# Patient Record
Sex: Male | Born: 1935 | Race: Black or African American | Hispanic: No | Marital: Married | Smoking: Never smoker
Health system: Southern US, Community
[De-identification: ages and names within clinical notes are randomized; demographics above are authoritative.]

## PROBLEM LIST (undated history)

## (undated) DIAGNOSIS — D649 Anemia, unspecified: Secondary | ICD-10-CM

## (undated) DIAGNOSIS — R001 Bradycardia, unspecified: Secondary | ICD-10-CM

## (undated) DIAGNOSIS — M109 Gout, unspecified: Secondary | ICD-10-CM

## (undated) DIAGNOSIS — Z992 Dependence on renal dialysis: Secondary | ICD-10-CM

## (undated) DIAGNOSIS — I4892 Unspecified atrial flutter: Secondary | ICD-10-CM

## (undated) DIAGNOSIS — E119 Type 2 diabetes mellitus without complications: Secondary | ICD-10-CM

## (undated) DIAGNOSIS — I471 Supraventricular tachycardia, unspecified: Secondary | ICD-10-CM

## (undated) DIAGNOSIS — N186 End stage renal disease: Secondary | ICD-10-CM

## (undated) DIAGNOSIS — I480 Paroxysmal atrial fibrillation: Secondary | ICD-10-CM

## (undated) DIAGNOSIS — G629 Polyneuropathy, unspecified: Secondary | ICD-10-CM

## (undated) DIAGNOSIS — H409 Unspecified glaucoma: Secondary | ICD-10-CM

## (undated) DIAGNOSIS — I10 Essential (primary) hypertension: Secondary | ICD-10-CM

## (undated) HISTORY — PX: SP AV DIALYSIS SHUNT ACCESS EXISTING *L*: HXRAD383

## (undated) HISTORY — PX: ABLATION OF DYSRHYTHMIC FOCUS: SHX254

---

## 1997-07-03 ENCOUNTER — Encounter: Admission: RE | Admit: 1997-07-03 | Discharge: 1997-07-03 | Payer: Self-pay | Admitting: Internal Medicine

## 1997-07-17 ENCOUNTER — Encounter: Admission: RE | Admit: 1997-07-17 | Discharge: 1997-07-17 | Payer: Self-pay | Admitting: Hematology and Oncology

## 1997-09-15 ENCOUNTER — Encounter: Admission: RE | Admit: 1997-09-15 | Discharge: 1997-09-15 | Payer: Self-pay | Admitting: Internal Medicine

## 1997-11-16 ENCOUNTER — Ambulatory Visit (HOSPITAL_COMMUNITY): Admission: RE | Admit: 1997-11-16 | Discharge: 1997-11-16 | Payer: Self-pay

## 1997-11-16 ENCOUNTER — Encounter: Admission: RE | Admit: 1997-11-16 | Discharge: 1997-11-16 | Payer: Self-pay | Admitting: Hematology and Oncology

## 1998-01-15 ENCOUNTER — Encounter: Admission: RE | Admit: 1998-01-15 | Discharge: 1998-01-15 | Payer: Self-pay | Admitting: Hematology and Oncology

## 1998-03-10 ENCOUNTER — Encounter: Admission: RE | Admit: 1998-03-10 | Discharge: 1998-03-10 | Payer: Self-pay | Admitting: Internal Medicine

## 1998-05-12 ENCOUNTER — Encounter: Admission: RE | Admit: 1998-05-12 | Discharge: 1998-05-12 | Payer: Self-pay | Admitting: Hematology and Oncology

## 1998-05-27 ENCOUNTER — Ambulatory Visit (HOSPITAL_COMMUNITY): Admission: RE | Admit: 1998-05-27 | Discharge: 1998-05-27 | Payer: Self-pay | Admitting: Urology

## 1998-05-27 ENCOUNTER — Encounter: Payer: Self-pay | Admitting: Urology

## 1998-06-21 ENCOUNTER — Encounter: Admission: RE | Admit: 1998-06-21 | Discharge: 1998-06-21 | Payer: Self-pay | Admitting: Internal Medicine

## 1998-09-27 ENCOUNTER — Encounter: Admission: RE | Admit: 1998-09-27 | Discharge: 1998-09-27 | Payer: Self-pay | Admitting: Internal Medicine

## 1998-12-21 ENCOUNTER — Ambulatory Visit (HOSPITAL_COMMUNITY): Admission: RE | Admit: 1998-12-21 | Discharge: 1998-12-21 | Payer: Self-pay | Admitting: Internal Medicine

## 1998-12-21 ENCOUNTER — Encounter: Admission: RE | Admit: 1998-12-21 | Discharge: 1998-12-21 | Payer: Self-pay | Admitting: Internal Medicine

## 1999-01-10 ENCOUNTER — Encounter: Admission: RE | Admit: 1999-01-10 | Discharge: 1999-01-10 | Payer: Self-pay | Admitting: Internal Medicine

## 1999-01-17 ENCOUNTER — Encounter: Admission: RE | Admit: 1999-01-17 | Discharge: 1999-01-17 | Payer: Self-pay | Admitting: Internal Medicine

## 1999-03-04 ENCOUNTER — Encounter: Admission: RE | Admit: 1999-03-04 | Discharge: 1999-03-04 | Payer: Self-pay | Admitting: Internal Medicine

## 1999-03-07 ENCOUNTER — Emergency Department (HOSPITAL_COMMUNITY): Admission: EM | Admit: 1999-03-07 | Discharge: 1999-03-07 | Payer: Self-pay | Admitting: Emergency Medicine

## 1999-03-10 ENCOUNTER — Encounter: Admission: RE | Admit: 1999-03-10 | Discharge: 1999-03-10 | Payer: Self-pay | Admitting: Internal Medicine

## 1999-04-12 ENCOUNTER — Encounter: Admission: RE | Admit: 1999-04-12 | Discharge: 1999-04-12 | Payer: Self-pay | Admitting: Internal Medicine

## 1999-05-17 ENCOUNTER — Encounter: Admission: RE | Admit: 1999-05-17 | Discharge: 1999-05-17 | Payer: Self-pay | Admitting: Internal Medicine

## 1999-06-07 ENCOUNTER — Encounter: Admission: RE | Admit: 1999-06-07 | Discharge: 1999-06-07 | Payer: Self-pay | Admitting: Internal Medicine

## 1999-10-24 ENCOUNTER — Encounter: Admission: RE | Admit: 1999-10-24 | Discharge: 1999-10-24 | Payer: Self-pay | Admitting: Internal Medicine

## 2000-01-01 ENCOUNTER — Emergency Department (HOSPITAL_COMMUNITY): Admission: EM | Admit: 2000-01-01 | Discharge: 2000-01-01 | Payer: Self-pay | Admitting: Emergency Medicine

## 2000-01-01 ENCOUNTER — Encounter: Payer: Self-pay | Admitting: Emergency Medicine

## 2000-01-09 ENCOUNTER — Ambulatory Visit (HOSPITAL_COMMUNITY): Admission: RE | Admit: 2000-01-09 | Discharge: 2000-01-09 | Payer: Self-pay | Admitting: Internal Medicine

## 2000-01-09 ENCOUNTER — Encounter: Admission: RE | Admit: 2000-01-09 | Discharge: 2000-01-09 | Payer: Self-pay | Admitting: Internal Medicine

## 2000-01-17 ENCOUNTER — Encounter: Admission: RE | Admit: 2000-01-17 | Discharge: 2000-01-17 | Payer: Self-pay | Admitting: Hematology and Oncology

## 2000-04-12 ENCOUNTER — Encounter: Admission: RE | Admit: 2000-04-12 | Discharge: 2000-04-12 | Payer: Self-pay | Admitting: Hematology and Oncology

## 2000-07-16 ENCOUNTER — Encounter: Admission: RE | Admit: 2000-07-16 | Discharge: 2000-07-16 | Payer: Self-pay | Admitting: Internal Medicine

## 2000-09-05 ENCOUNTER — Encounter: Admission: RE | Admit: 2000-09-05 | Discharge: 2000-09-05 | Payer: Self-pay | Admitting: Internal Medicine

## 2000-10-04 ENCOUNTER — Encounter: Admission: RE | Admit: 2000-10-04 | Discharge: 2000-10-04 | Payer: Self-pay | Admitting: Internal Medicine

## 2000-11-21 ENCOUNTER — Encounter: Admission: RE | Admit: 2000-11-21 | Discharge: 2000-11-21 | Payer: Self-pay

## 2001-04-17 ENCOUNTER — Observation Stay (HOSPITAL_COMMUNITY): Admission: AD | Admit: 2001-04-17 | Discharge: 2001-04-18 | Payer: Self-pay | Admitting: Internal Medicine

## 2001-04-30 ENCOUNTER — Encounter: Admission: RE | Admit: 2001-04-30 | Discharge: 2001-04-30 | Payer: Self-pay | Admitting: Internal Medicine

## 2001-05-09 ENCOUNTER — Encounter: Admission: RE | Admit: 2001-05-09 | Discharge: 2001-05-09 | Payer: Self-pay | Admitting: Internal Medicine

## 2001-05-31 ENCOUNTER — Encounter: Admission: RE | Admit: 2001-05-31 | Discharge: 2001-05-31 | Payer: Self-pay | Admitting: Internal Medicine

## 2001-08-09 ENCOUNTER — Encounter: Admission: RE | Admit: 2001-08-09 | Discharge: 2001-08-09 | Payer: Self-pay | Admitting: Internal Medicine

## 2001-08-16 ENCOUNTER — Encounter: Admission: RE | Admit: 2001-08-16 | Discharge: 2001-08-16 | Payer: Self-pay | Admitting: Internal Medicine

## 2001-08-19 ENCOUNTER — Ambulatory Visit (HOSPITAL_COMMUNITY): Admission: RE | Admit: 2001-08-19 | Discharge: 2001-08-19 | Payer: Self-pay

## 2001-11-06 ENCOUNTER — Encounter: Admission: RE | Admit: 2001-11-06 | Discharge: 2001-11-06 | Payer: Self-pay | Admitting: Internal Medicine

## 2001-11-25 ENCOUNTER — Encounter: Admission: RE | Admit: 2001-11-25 | Discharge: 2001-11-25 | Payer: Self-pay | Admitting: Internal Medicine

## 2001-12-12 ENCOUNTER — Encounter: Admission: RE | Admit: 2001-12-12 | Discharge: 2001-12-12 | Payer: Self-pay | Admitting: Internal Medicine

## 2002-01-20 ENCOUNTER — Encounter: Admission: RE | Admit: 2002-01-20 | Discharge: 2002-01-20 | Payer: Self-pay | Admitting: Internal Medicine

## 2002-03-06 ENCOUNTER — Encounter: Admission: RE | Admit: 2002-03-06 | Discharge: 2002-03-06 | Payer: Self-pay | Admitting: Internal Medicine

## 2002-03-28 ENCOUNTER — Ambulatory Visit (HOSPITAL_COMMUNITY): Admission: RE | Admit: 2002-03-28 | Discharge: 2002-03-28 | Payer: Self-pay | Admitting: Interventional Cardiology

## 2002-03-28 ENCOUNTER — Encounter: Payer: Self-pay | Admitting: Interventional Cardiology

## 2002-06-06 ENCOUNTER — Encounter: Admission: RE | Admit: 2002-06-06 | Discharge: 2002-06-06 | Payer: Self-pay | Admitting: Internal Medicine

## 2002-08-26 ENCOUNTER — Encounter: Admission: RE | Admit: 2002-08-26 | Discharge: 2002-08-26 | Payer: Self-pay | Admitting: Internal Medicine

## 2002-09-08 ENCOUNTER — Encounter: Payer: Self-pay | Admitting: Internal Medicine

## 2002-09-08 ENCOUNTER — Ambulatory Visit (HOSPITAL_COMMUNITY): Admission: RE | Admit: 2002-09-08 | Discharge: 2002-09-08 | Payer: Self-pay | Admitting: Internal Medicine

## 2002-10-29 ENCOUNTER — Encounter: Admission: RE | Admit: 2002-10-29 | Discharge: 2002-10-29 | Payer: Self-pay | Admitting: Internal Medicine

## 2002-12-08 ENCOUNTER — Encounter: Admission: RE | Admit: 2002-12-08 | Discharge: 2002-12-08 | Payer: Self-pay | Admitting: Internal Medicine

## 2003-01-08 ENCOUNTER — Encounter: Admission: RE | Admit: 2003-01-08 | Discharge: 2003-01-08 | Payer: Self-pay | Admitting: Internal Medicine

## 2003-05-11 ENCOUNTER — Ambulatory Visit (HOSPITAL_COMMUNITY): Admission: RE | Admit: 2003-05-11 | Discharge: 2003-05-11 | Payer: Self-pay | Admitting: Internal Medicine

## 2003-05-14 ENCOUNTER — Encounter: Admission: RE | Admit: 2003-05-14 | Discharge: 2003-05-14 | Payer: Self-pay | Admitting: Internal Medicine

## 2003-08-24 ENCOUNTER — Encounter: Admission: RE | Admit: 2003-08-24 | Discharge: 2003-08-24 | Payer: Self-pay | Admitting: Internal Medicine

## 2003-10-13 ENCOUNTER — Emergency Department (HOSPITAL_COMMUNITY): Admission: EM | Admit: 2003-10-13 | Discharge: 2003-10-13 | Payer: Self-pay | Admitting: Emergency Medicine

## 2004-01-07 ENCOUNTER — Emergency Department (HOSPITAL_COMMUNITY): Admission: EM | Admit: 2004-01-07 | Discharge: 2004-01-07 | Payer: Self-pay | Admitting: Emergency Medicine

## 2004-01-30 ENCOUNTER — Emergency Department (HOSPITAL_COMMUNITY): Admission: EM | Admit: 2004-01-30 | Discharge: 2004-01-30 | Payer: Self-pay | Admitting: *Deleted

## 2005-01-17 ENCOUNTER — Encounter: Admission: RE | Admit: 2005-01-17 | Discharge: 2005-01-17 | Payer: Self-pay | Admitting: Nephrology

## 2010-01-26 ENCOUNTER — Inpatient Hospital Stay (HOSPITAL_COMMUNITY)
Admission: EM | Admit: 2010-01-26 | Discharge: 2010-02-03 | Payer: Self-pay | Source: Home / Self Care | Attending: Nephrology | Admitting: Nephrology

## 2010-01-27 ENCOUNTER — Encounter (INDEPENDENT_AMBULATORY_CARE_PROVIDER_SITE_OTHER): Payer: Self-pay | Admitting: Nephrology

## 2010-02-02 LAB — GLUCOSE, CAPILLARY
Glucose-Capillary: 161 mg/dL — ABNORMAL HIGH (ref 70–99)
Glucose-Capillary: 116 mg/dL — ABNORMAL HIGH (ref 70–99)
Glucose-Capillary: 223 mg/dL — ABNORMAL HIGH (ref 70–99)
Glucose-Capillary: 209 mg/dL — ABNORMAL HIGH (ref 70–99)

## 2010-02-03 LAB — GLUCOSE, CAPILLARY
Glucose-Capillary: 245 mg/dL — ABNORMAL HIGH (ref 70–99)
Glucose-Capillary: 334 mg/dL — ABNORMAL HIGH (ref 70–99)

## 2010-03-10 ENCOUNTER — Ambulatory Visit: Payer: Self-pay

## 2010-03-26 ENCOUNTER — Inpatient Hospital Stay (HOSPITAL_COMMUNITY)
Admission: EM | Admit: 2010-03-26 | Discharge: 2010-03-29 | DRG: 553 | Disposition: A | Discharge status: Home / Self Care | Payer: Medicare Other | Attending: Internal Medicine | Admitting: Internal Medicine

## 2010-03-26 ENCOUNTER — Inpatient Hospital Stay (HOSPITAL_COMMUNITY): Payer: Medicare Other

## 2010-03-26 ENCOUNTER — Emergency Department (HOSPITAL_COMMUNITY): Payer: Medicare Other

## 2010-03-26 DIAGNOSIS — H409 Unspecified glaucoma: Secondary | ICD-10-CM | POA: Diagnosis present

## 2010-03-26 DIAGNOSIS — N2581 Secondary hyperparathyroidism of renal origin: Secondary | ICD-10-CM | POA: Diagnosis present

## 2010-03-26 DIAGNOSIS — E119 Type 2 diabetes mellitus without complications: Secondary | ICD-10-CM | POA: Diagnosis present

## 2010-03-26 DIAGNOSIS — Z992 Dependence on renal dialysis: Secondary | ICD-10-CM

## 2010-03-26 DIAGNOSIS — I120 Hypertensive chronic kidney disease with stage 5 chronic kidney disease or end stage renal disease: Secondary | ICD-10-CM | POA: Diagnosis present

## 2010-03-26 DIAGNOSIS — Z79899 Other long term (current) drug therapy: Secondary | ICD-10-CM

## 2010-03-26 DIAGNOSIS — D638 Anemia in other chronic diseases classified elsewhere: Secondary | ICD-10-CM | POA: Diagnosis present

## 2010-03-26 DIAGNOSIS — M109 Gout, unspecified: Principal | ICD-10-CM | POA: Diagnosis present

## 2010-03-26 DIAGNOSIS — N186 End stage renal disease: Secondary | ICD-10-CM | POA: Diagnosis present

## 2010-03-26 DIAGNOSIS — Z7982 Long term (current) use of aspirin: Secondary | ICD-10-CM

## 2010-03-26 LAB — POCT I-STAT, CHEM 8
BUN: 39 mg/dL — ABNORMAL HIGH (ref 6–23)
Calcium, Ion: 1.06 mmol/L — ABNORMAL LOW (ref 1.12–1.32)
Chloride: 98 mEq/L (ref 96–112)
Creatinine, Ser: 6.3 mg/dL — ABNORMAL HIGH (ref 0.4–1.5)
Glucose, Bld: 92 mg/dL (ref 70–99)
HCT: 40 % (ref 39.0–52.0)
Hemoglobin: 13.6 g/dL (ref 13.0–17.0)
Potassium: 4 mEq/L (ref 3.5–5.1)
Sodium: 133 mEq/L — ABNORMAL LOW (ref 135–145)
TCO2: 25 mmol/L (ref 0–100)

## 2010-03-26 LAB — DIFFERENTIAL
Basophils Absolute: 0 10*3/uL (ref 0.0–0.1)
Basophils Relative: 0 % (ref 0–1)
Eosinophils Absolute: 0 10*3/uL (ref 0.0–0.7)
Eosinophils Relative: 0 % (ref 0–5)
Lymphocytes Relative: 12 % (ref 12–46)
Lymphs Abs: 1 10*3/uL (ref 0.7–4.0)
Monocytes Absolute: 1 10*3/uL (ref 0.1–1.0)
Monocytes Relative: 11 % (ref 3–12)
Neutro Abs: 6.6 10*3/uL (ref 1.7–7.7)
Neutrophils Relative %: 77 % (ref 43–77)

## 2010-03-26 LAB — COMPREHENSIVE METABOLIC PANEL
ALT: 11 U/L (ref 0–53)
AST: 18 U/L (ref 0–37)
Alkaline Phosphatase: 75 U/L (ref 39–117)
CO2: 26 mEq/L (ref 19–32)
GFR calc Af Amer: 12 mL/min — ABNORMAL LOW (ref >60)
Glucose, Bld: 88 mg/dL (ref 70–99)
Potassium: 4.1 mEq/L (ref 3.5–5.1)
Sodium: 131 mEq/L — ABNORMAL LOW (ref 135–145)
Total Protein: 8.3 g/dL (ref 6.0–8.3)

## 2010-03-26 LAB — CBC
HCT: 36.2 % — ABNORMAL LOW (ref 39.0–52.0)
Hemoglobin: 11.8 g/dL — ABNORMAL LOW (ref 13.0–17.0)
RDW: 15.3 % (ref 11.5–15.5)
WBC: 8.6 10*3/uL (ref 4.0–10.5)

## 2010-03-26 LAB — URIC ACID: Uric Acid, Serum: 6.9 mg/dL (ref 4.0–7.8)

## 2010-03-27 LAB — CBC
MCV: 81.3 fL (ref 78.0–100.0)
Platelets: 304 10*3/uL (ref 150–400)
RBC: 4.28 MIL/uL (ref 4.22–5.81)
RDW: 15.5 % (ref 11.5–15.5)
WBC: 7 10*3/uL (ref 4.0–10.5)

## 2010-03-27 LAB — TSH: TSH: 2.752 u[IU]/mL (ref 0.350–4.500)

## 2010-03-27 LAB — BASIC METABOLIC PANEL
BUN: 25 mg/dL — ABNORMAL HIGH (ref 6–23)
Chloride: 96 mEq/L (ref 96–112)
GFR calc Af Amer: 15 mL/min — ABNORMAL LOW (ref >60)
GFR calc non Af Amer: 13 mL/min — ABNORMAL LOW (ref >60)
Potassium: 3.6 mEq/L (ref 3.5–5.1)
Sodium: 132 mEq/L — ABNORMAL LOW (ref 135–145)

## 2010-03-27 LAB — HEMOGLOBIN A1C
Hgb A1c MFr Bld: 6.1 % — ABNORMAL HIGH (ref <5.7)
Mean Plasma Glucose: 128 mg/dL — ABNORMAL HIGH (ref <117)

## 2010-03-27 LAB — HEPATITIS B SURFACE ANTIGEN: Hepatitis B Surface Ag: NEGATIVE

## 2010-03-27 LAB — GLUCOSE, CAPILLARY
Glucose-Capillary: 68 mg/dL — ABNORMAL LOW (ref 70–99)
Glucose-Capillary: 262 mg/dL — ABNORMAL HIGH (ref 70–99)
Glucose-Capillary: 252 mg/dL — ABNORMAL HIGH (ref 70–99)

## 2010-03-28 LAB — GLUCOSE, CAPILLARY
Glucose-Capillary: 149 mg/dL — ABNORMAL HIGH (ref 70–99)
Glucose-Capillary: 279 mg/dL — ABNORMAL HIGH (ref 70–99)

## 2010-03-29 ENCOUNTER — Inpatient Hospital Stay (HOSPITAL_COMMUNITY): Payer: Medicare Other

## 2010-03-29 ENCOUNTER — Inpatient Hospital Stay (HOSPITAL_COMMUNITY)
Admission: RE | Admit: 2010-03-29 | Discharge: 2010-03-29 | Disposition: A | Discharge status: Home / Self Care | Payer: Medicare Other | Source: Ambulatory Visit | Attending: Internal Medicine | Admitting: Internal Medicine

## 2010-03-29 LAB — POCT I-STAT 4, (NA,K, GLUC, HGB,HCT)
Glucose, Bld: 169 mg/dL — ABNORMAL HIGH (ref 70–99)
Hemoglobin: 11.6 g/dL — ABNORMAL LOW (ref 13.0–17.0)
Potassium: 3.9 mEq/L (ref 3.5–5.1)

## 2010-03-29 LAB — GLUCOSE, CAPILLARY: Glucose-Capillary: 107 mg/dL — ABNORMAL HIGH (ref 70–99)

## 2010-04-02 LAB — CULTURE, BLOOD (ROUTINE X 2)
Culture  Setup Time: 201202260135
Culture: NO GROWTH

## 2010-04-07 NOTE — Discharge Summary (Addendum)
  [name] [name], [name] [name]               ACCOUNT NO.:  [id number]  MEDICAL RECORD NO.:  [id number]           PATIENT TYPE:  O  LOCATION:  DIALYSI                      FACILITY:  [facility]  PHYSICIAN:  Deirdre Peer. Leontae Bostock, M.D. DATE OF BIRTH:  [birth year]  DATE OF ADMISSION:  03/29/2010 DATE OF DISCHARGE:                              DISCHARGE SUMMARY   DISCHARGE DIAGNOSIS: 1. Gout flare right wrist and bilateral knee with resultant ambulatory     dysfunction, resolved at discharge, prednisone course completed in     hospital.  The patient will continue with colchicine and     indomethacin x7 days. 2. Hypertension. 3. End-stage renal disease. 4. Diabetes. 5. Glaucoma. 6. Ambulatory dysfunction, improved.  Please note the patient refused     home physical therapy.  DISCHARGE MEDICATIONS: 1. Calcium. 2. Indomethacin. 3. Prednisone.  Please note prednisone course completed during     hospital. 4. Amlodipine 10 mg. 5. Aspirin 81 mg. 6. Atenolol 100 mg. 7. Benazepril 40 mg.8. Clonidine 0.2 mg half tablet twice a day. 9. Colchicine 0.6 mg daily for 7 days. 10.Glipizide XL 10 mg. 11.Prednisolone acetate drops 12.Nephro-Vite tab daily. 13.Simvastatin 20 mg daily. 14.Renagel 3 times a day. 15.Travatan. 16.Tramadol b.i.d. p.r.n.  DISPOSITION:  Discharged to home in stable condition.  Again, please note the patient refused home PT.  CONSULTANTS:  [facility]  STUDIES:  Blood culture x2, no growth to date.  Hemoglobin A1c 6.1, creatinine 4.5.  TSH 2.7.  Uric acid 6.9.  Chest x-ray, no acute findings.  HISTORY OF PRESENT ILLNESS:  Pleasant 77 year old male presented to the hospital with weakness and inability to walk and right hand painful and swollen from an apparent gout flare.  Due to increased risk of fall admission was deemed necessary for further evaluation and treatment. Please see dictated H and P for further details.  Past medical history, medications, social history,  past surgical history, allergies, family history per admission H and P.  HOSPITAL COURSE:  The patient was admitted to a medicine floor bed for evaluation and treatment of extremity pain related to a gout flare and results in ambulatory dysfunction.  The patient was treated with prednisone, colchicine and indomethacin.  The patient had significant improvement in his symptoms.  He was seen by PT.  He was ambulating well.  Home PT was recommended, however, the patient refused.  The patient was continued on his outpatient meds.  There were no complications during this hospitalization.  He did have a transient elevation in his blood sugar and BP when he was without his meds, however, both came under a rapid control with restarting home medication.  The patient did receive dialysis while hospitalized. Again, there were no complications.  The patient is medically stable for discharge to home.     Deirdre Peer. Nneka Blanda, M.D.     RDP/MEDQ  D:  03/29/2010  T:  03/29/2010  Job:  [id number]  Electronically Signed by [name] [name] M.D. on 04/05/2010 05:22:51 PM Electronically Signed by [name] [name] M.D. on 04/05/2010 05:22:51 PM Electronically Signed by [name] [name] M.D. on 04/05/2010 07:36:58 PM

## 2010-04-11 LAB — CULTURE, BLOOD (ROUTINE X 2)
Culture  Setup Time: 201201011040
Culture  Setup Time: 201201011040
Culture: NO GROWTH
Culture: NO GROWTH

## 2010-04-11 LAB — BASIC METABOLIC PANEL
BUN: 59 mg/dL — ABNORMAL HIGH (ref 6–23)
GFR calc non Af Amer: 9 mL/min — ABNORMAL LOW (ref >60)
Glucose, Bld: 88 mg/dL (ref 70–99)
Potassium: 4.7 mEq/L (ref 3.5–5.1)

## 2010-04-11 LAB — CBC
HCT: 26.1 % — ABNORMAL LOW (ref 39.0–52.0)
HCT: 26.7 % — ABNORMAL LOW (ref 39.0–52.0)
HCT: 29.4 % — ABNORMAL LOW (ref 39.0–52.0)
Hemoglobin: 8.7 g/dL — ABNORMAL LOW (ref 13.0–17.0)
Hemoglobin: 8.8 g/dL — ABNORMAL LOW (ref 13.0–17.0)
MCH: 27.2 pg (ref 26.0–34.0)
MCH: 27.3 pg (ref 26.0–34.0)
MCH: 27.7 pg (ref 26.0–34.0)
MCHC: 32.6 g/dL (ref 30.0–36.0)
MCHC: 33.3 g/dL (ref 30.0–36.0)
MCHC: 34.2 g/dL (ref 30.0–36.0)
MCV: 81.8 fL (ref 78.0–100.0)
MCV: 82.1 fL (ref 78.0–100.0)
MCV: 82.6 fL (ref 78.0–100.0)
MCV: 83.2 fL (ref 78.0–100.0)
Platelets: 192 10*3/uL (ref 150–400)
Platelets: 195 10*3/uL (ref 150–400)
Platelets: 214 10*3/uL (ref 150–400)
Platelets: 215 10*3/uL (ref 150–400)
RBC: 3.19 MIL/uL — ABNORMAL LOW (ref 4.22–5.81)
RBC: 3.24 MIL/uL — ABNORMAL LOW (ref 4.22–5.81)
RBC: 3.3 MIL/uL — ABNORMAL LOW (ref 4.22–5.81)
RBC: 3.56 MIL/uL — ABNORMAL LOW (ref 4.22–5.81)
RBC: 3.64 MIL/uL — ABNORMAL LOW (ref 4.22–5.81)
RDW: 13.8 % (ref 11.5–15.5)
RDW: 14 % (ref 11.5–15.5)
RDW: 14.1 % (ref 11.5–15.5)
WBC: 10.4 10*3/uL (ref 4.0–10.5)
WBC: 6.9 10*3/uL (ref 4.0–10.5)
WBC: 8.7 10*3/uL (ref 4.0–10.5)

## 2010-04-11 LAB — RENAL FUNCTION PANEL
Albumin: 2 g/dL — ABNORMAL LOW (ref 3.5–5.2)
Albumin: 2.4 g/dL — ABNORMAL LOW (ref 3.5–5.2)
Albumin: 2.5 g/dL — ABNORMAL LOW (ref 3.5–5.2)
BUN: 44 mg/dL — ABNORMAL HIGH (ref 6–23)
BUN: 50 mg/dL — ABNORMAL HIGH (ref 6–23)
BUN: 58 mg/dL — ABNORMAL HIGH (ref 6–23)
CO2: 26 mEq/L (ref 19–32)
CO2: 27 mEq/L (ref 19–32)
Calcium: 8.7 mg/dL (ref 8.4–10.5)
Chloride: 102 mEq/L (ref 96–112)
Chloride: 102 mEq/L (ref 96–112)
Creatinine, Ser: 4.21 mg/dL — ABNORMAL HIGH (ref 0.4–1.5)
GFR calc Af Amer: 10 mL/min — ABNORMAL LOW (ref >60)
GFR calc Af Amer: 12 mL/min — ABNORMAL LOW (ref >60)
GFR calc Af Amer: 14 mL/min — ABNORMAL LOW (ref >60)
GFR calc non Af Amer: 10 mL/min — ABNORMAL LOW (ref >60)
GFR calc non Af Amer: 12 mL/min — ABNORMAL LOW (ref >60)
GFR calc non Af Amer: 9 mL/min — ABNORMAL LOW (ref >60)
Glucose, Bld: 107 mg/dL — ABNORMAL HIGH (ref 70–99)
Glucose, Bld: 188 mg/dL — ABNORMAL HIGH (ref 70–99)
Phosphorus: 5 mg/dL — ABNORMAL HIGH (ref 2.3–4.6)
Phosphorus: 7.9 mg/dL — ABNORMAL HIGH (ref 2.3–4.6)
Potassium: 3.9 mEq/L (ref 3.5–5.1)
Potassium: 4.3 mEq/L (ref 3.5–5.1)
Potassium: 4.5 mEq/L (ref 3.5–5.1)
Potassium: 4.7 mEq/L (ref 3.5–5.1)
Sodium: 138 mEq/L (ref 135–145)
Sodium: 138 mEq/L (ref 135–145)

## 2010-04-11 LAB — CARDIAC PANEL(CRET KIN+CKTOT+MB+TROPI)
CK, MB: 2.7 ng/mL (ref 0.3–4.0)
Relative Index: 0.8 (ref 0.0–2.5)
Relative Index: 0.9 (ref 0.0–2.5)
Total CK: 328 U/L — ABNORMAL HIGH (ref 7–232)
Troponin I: 0.06 ng/mL (ref 0.00–0.06)

## 2010-04-11 LAB — FERRITIN: Ferritin: 262 ng/mL (ref 22–322)

## 2010-04-11 LAB — GLUCOSE, CAPILLARY
Glucose-Capillary: 102 mg/dL — ABNORMAL HIGH (ref 70–99)
Glucose-Capillary: 108 mg/dL — ABNORMAL HIGH (ref 70–99)
Glucose-Capillary: 109 mg/dL — ABNORMAL HIGH (ref 70–99)
Glucose-Capillary: 185 mg/dL — ABNORMAL HIGH (ref 70–99)
Glucose-Capillary: 204 mg/dL — ABNORMAL HIGH (ref 70–99)
Glucose-Capillary: 232 mg/dL — ABNORMAL HIGH (ref 70–99)
Glucose-Capillary: 342 mg/dL — ABNORMAL HIGH (ref 70–99)
Glucose-Capillary: 351 mg/dL — ABNORMAL HIGH (ref 70–99)
Glucose-Capillary: 86 mg/dL (ref 70–99)
Glucose-Capillary: 95 mg/dL (ref 70–99)
Glucose-Capillary: 96 mg/dL (ref 70–99)
Glucose-Capillary: 98 mg/dL (ref 70–99)

## 2010-04-11 LAB — URINALYSIS, DIPSTICK ONLY
Leukocytes, UA: NEGATIVE
Nitrite: NEGATIVE
Specific Gravity, Urine: 1.015 (ref 1.005–1.030)
Urobilinogen, UA: 0.2 mg/dL (ref 0.0–1.0)
pH: 6.5 (ref 5.0–8.0)

## 2010-04-11 LAB — HEPATITIS B SURFACE ANTIGEN: Hepatitis B Surface Ag: NEGATIVE

## 2010-04-11 LAB — URIC ACID: Uric Acid, Serum: 8.1 mg/dL — ABNORMAL HIGH (ref 4.0–7.8)

## 2010-04-11 LAB — COMPREHENSIVE METABOLIC PANEL
AST: 26 U/L (ref 0–37)
Albumin: 3.2 g/dL — ABNORMAL LOW (ref 3.5–5.2)
Calcium: 8.7 mg/dL (ref 8.4–10.5)
Chloride: 101 mEq/L (ref 96–112)
Creatinine, Ser: 5.18 mg/dL — ABNORMAL HIGH (ref 0.4–1.5)
GFR calc Af Amer: 13 mL/min — ABNORMAL LOW (ref >60)
Total Protein: 7.4 g/dL (ref 6.0–8.3)

## 2010-04-11 LAB — PTH-RELATED PEPTIDE

## 2010-04-11 LAB — PROTIME-INR: INR: 1.19 (ref 0.00–1.49)

## 2010-04-11 LAB — HEPATIC FUNCTION PANEL
ALT: 9 U/L (ref 0–53)
Bilirubin, Direct: 0.1 mg/dL (ref 0.0–0.3)
Indirect Bilirubin: 0.5 mg/dL (ref 0.3–0.9)

## 2010-04-11 LAB — IRON AND TIBC
Iron: 25 ug/dL — ABNORMAL LOW (ref 42–135)
TIBC: 207 ug/dL — ABNORMAL LOW (ref 215–435)
UIBC: 182 ug/dL

## 2010-04-11 LAB — MRSA PCR SCREENING: MRSA by PCR: NEGATIVE

## 2010-04-11 LAB — HEPATITIS B CORE ANTIBODY, TOTAL: Hep B Core Total Ab: NEGATIVE

## 2010-04-11 LAB — APTT: aPTT: 47 seconds — ABNORMAL HIGH (ref 24–37)

## 2010-04-19 NOTE — H&P (Signed)
[name] [name]               ACCOUNT NO.:  [id number]  MEDICAL RECORD NO.:  [id number]           PATIENT TYPE:  LOCATION:                                 FACILITY:  PHYSICIAN:  [name] Fellers, MD   DATE OF BIRTH:  08-15-1933  DATE OF ADMISSION: DATE OF DISCHARGE:                             HISTORY & PHYSICAL   PRIMARY CARE PHYSICIAN:  [name] [name].  CHIEF COMPLAINT:  Weakness and inability to walk and right hand painful swelling.  HISTORY OF PRESENT ILLNESS:  This is 77 year old African American gentleman who came to the emergency room because of being unable to walk for the past 4-5 days.  He gets dialysis Tuesday, Thursday and Saturday at [facility].  He could not go today because of ambulatory difficulty.  He said his legs get wobbly when he tries to walk and could not move his legs.  He denies any history of trauma.  No history of weakness involving the upper extremities.  No slurred speech. No headaches.  No symptoms to suggest stroke.  Over the past 2-3 days, he has also been having swelling and pain involving his right hand. This was fairly worse yesterday, but this will be better today.  He is usually able to ambulate with his walker, but because of his hand pain, he cannot hold onto his walker.  The patient denies any chest pain, shortness of breath, nausea, vomiting, diaphoresis or fever.  He is going to be admitted to the hospital for possible gout and maintenance of hemodialysis.  PAST MEDICAL HISTORY:  Includes; 1. End-stage renal disease, on hemodialysis started about 2 months     ago.  He has a Perm-A-Cath in his right upper chest wall from an AV     fistula placed 2 months ago in his left forearm, which is not ready     for use. 2. History of diabetes mellitus type 2. 3. High blood pressure. 4. Glaucoma. 5. Gout.  MEDICATIONS: 1. Amlodipine 10 mg daily. 2. Aspirin 81 mg daily. 3. Atenolol 25 mg daily. 4. Benazepril 40 mg  b.i.d. 5. Ultram p.r.n.9 6. Clonidine 0.2 mg b.i.d. 7. Colchicine. 8. Glipizide 10 mg b.i.d. 9. Rena-Vite. 10.Zocor 20 mg at bedtime.  ALLERGIES:  None.  SOCIAL HISTORY:  No smoking, alcohol or drugs.  He lives with his wife.  REVIEW OF SYSTEMS:  Ten-point review of systems is negative except as described above.  FAMILY HISTORY:  Noncontributory.  PHYSICAL EXAMINATION:  VITAL SIGNS:  Blood pressure 152/91, pulse is 58, respiration 18, temperature 98.8. GENERAL:  The patient is lying in bed, appeared comfortable in no distress. HEENT:  Pallor.  Extraocular movements are intact.  Mouth is moist, it is anicteric.  He has opacity involving the cornea on the left side. NECK:  Supple.  No JVD, adenopathy or thyromegaly. LUNGS:  Clear to auscultation bilaterally with no wheezing or crackles. HEART:  S1, S2 questionable 2/6 systolic murmur at the apex. ABDOMEN:  Full, soft, nontender.  Bowel sounds present.  No masses. EXTREMITIES:  No edema in both lower extremities.  The patient is able to move  both lower extremities.  I did not see any lateralizing signs. NEUROLOGIC:  This patient appeared intact.  He is also able to move his upper extremities with no difficulty. MUSCULOSKELETAL:  There is obvious swelling involvement in the right hand, which is warm and tender to touch, localized more on his right wrist region. SKIN:  No rash or lesion.  Has an AV fistula in his left forearm with the palpable bruit.  He has a Perm-A-Cath also in his right upper chest wall with no evidence of spine infection.  LABORATORY DATA:  Sodium is 131, potassium 4.1, BUN 38, creatinine 5.76, carbon dioxide 26, glucose 88.  White count is normal at 8.6, hemoglobin 11.8, platelet count is 323.  Chest x-ray showed cardiomegaly with pulmonary venous hypertension with no acute findings.  ASSESSMENT:  This 77 year old gentleman admitted with; 1. Weakness and inability to walk.  Etiology of this is unclear at      this time, but just be related to deconditioning or debility. 2. Gout, involving the right hand.  This will also impair his ability     to use his walker. 3. End-stage renal disease, on hemodialysis.  The patient did not go     to dialysis today. 4. Diabetes mellitus, which is currently controlled. 5. Uncontrolled high blood pressure.  PLAN:  Admit to medical bed.  The patient will be commenced on low-dose indomethacin and prednisone.  I will also start renal dose adjusted colchicine.  The patient will get uric acid and ESR check.  I have discussed with Dr. Estill Bamberg of Nephrology Service who will see the patient.  His condition is currently stable.  The patient should get physical therapy with the medicines should be mostly continued.     [name] Fellers, MD     FA/MEDQ  D:  03/26/2010  T:  03/26/2010  Job:  [id number]  cc:   [name] [name]  Electronically Signed by [name] [name]  on 04/19/2010 02:08:14 AM

## 2010-04-28 ENCOUNTER — Other Ambulatory Visit (HOSPITAL_COMMUNITY): Payer: Self-pay | Admitting: Nephrology

## 2010-04-28 DIAGNOSIS — N186 End stage renal disease: Secondary | ICD-10-CM

## 2010-05-02 ENCOUNTER — Ambulatory Visit (HOSPITAL_COMMUNITY)
Admission: RE | Admit: 2010-05-02 | Discharge: 2010-05-02 | Disposition: A | Discharge status: Home / Self Care | Payer: Medicare Other | Source: Ambulatory Visit | Attending: Nephrology | Admitting: Nephrology

## 2010-05-02 ENCOUNTER — Other Ambulatory Visit (HOSPITAL_COMMUNITY): Payer: Self-pay | Admitting: Nephrology

## 2010-05-02 DIAGNOSIS — N186 End stage renal disease: Secondary | ICD-10-CM | POA: Insufficient documentation

## 2010-05-02 DIAGNOSIS — Z452 Encounter for adjustment and management of vascular access device: Secondary | ICD-10-CM | POA: Insufficient documentation

## 2010-06-06 ENCOUNTER — Other Ambulatory Visit: Payer: Self-pay | Admitting: Nephrology

## 2010-06-06 ENCOUNTER — Ambulatory Visit
Admission: RE | Admit: 2010-06-06 | Discharge: 2010-06-06 | Disposition: A | Discharge status: Home / Self Care | Payer: Medicare Other | Source: Ambulatory Visit | Attending: Nephrology | Admitting: Nephrology

## 2010-06-06 DIAGNOSIS — M25569 Pain in unspecified knee: Secondary | ICD-10-CM

## 2010-06-17 NOTE — Procedures (Signed)
[place]. [facility]  Patient:    [name] [name], [name] [name] Visit Number: [id number] MRN: [id number]          Service Type: OBV Location: [id number] Attending Physician:  Lewayne Bunting Dictated by:   Doylene Canning. Ladona Ridgel, M.D. [facility] Proc. Date: 04/17/01 Admit Date:  04/17/2001 Discharge Date: 04/18/2001   CC:         Garnette Scheuermann, M.D.  Tawni Millers, M.D.   Procedure Report  PROCEDURE PERFORMED:  Electrophysiology study and catheter ablation of atrial flutter.  REFERRING PHYSICIAN:  Dr. Garnette Scheuermann.  I. HISTORY OF PRESENT ILLNESS:  The patient is a very pleasant 77 year old man with a six or seven year history of atrial flutter.  The patient has been seen multiple times in the emergency room with rapid ventricular rates.  He is now referred for electrophysiology study and catheter ablation.  As the patient has not been on Coumadin and desired not to be on Coumadin for any longer than possible, he has subsequently undergone transesophageal echo demonstrating no evidence of left atrial appendage thrombus.  II. PROCEDURE:  After informed consent was obtained, the patient was taken to the diagnostic EP lab in alert state.  After the usual preparation and draping, intravenous Fentanyl and midazolam was given for sedation.  A 6-French hexapolar catheter was inserted percutaneously in the right jugular vein and advanced to the coronary sinus.  The 5-French quadripolar catheter was inserted percutaneously in the right femoral vein and advanced to the bundle region.  A 7-French 20 ablation catheter was inserted percutaneously in the right femoral vein and advanced to the right atrium.  After measurement at the basic intervals, mapping was carried out demonstrating typical atrial flutter at a cycle length of 268 msec.  The atrial activation was counterclockwise around the tricuspid valve anulus.  The coronary sinus was earliest in the proximal catheter, latest in the  distal with typical atrial flutter likely present.  The ablation catheter was subsequently maneuvered into the usual atrial flutter isthmus.  Two oropharyngeal applications were subsequently administered and during the second oropharyngeal application, atrial flutter was terminated and sinus rhythm restored.  Pacing from the coronary sinus demonstrated residual isthmus conduction.  A third oropharyngeal application was delivered that resulted in the creation of isthmus block.  Bonus oropharyngeal applications were then delivered and the patient was observed for 30 minutes and there was no residual isthmus conduction.  In addition, there was no recurrent atrial flutter.  The catheters were then removed, hemostasis was assured, and the patient was returned to his room in satisfactory condition.  III. COMPLICATIONS:  There were no immediate procedure complications.  IV. RESULTS:    A. Baseline ECG: A baseline ECG demonstrates typical atrial flutter with       2:1 AV conduction.    B. Baseline intervals: Atrial flutter cycle length was 268 msec.  The       HV interval was 58 msec.  The cure restoration was approximately       80 msec.    C. Rapid atrial pacing: Following ablation, rapid atrial pacing from the       coronary sinus demonstrated an AV Wenckebachs cycle length of 390 msec.       The PR interval remained less the RV-interval.  There is no inducible       SVT.    D. Programmed atrial stimulation: Programmed atrial stimulation after       catheter ablation at a base cycle length  was carried out.  The S1 is       two intervals, increased from 440 msec down to 320 msec with a AV node       ERP observed.  During programmed atrial stimulation, there were no       AG jumps, no echo beats, and no inducible SVT.    E. Rhythm observed: Atrial flutter.  Initiation present at the time       of EP study, duration sustained, cycle length 268 msec, termination       with catheter ablation.     F. Mapping.  Mapping of the patients atrial flutter demonstrated typical       counterclockwise tricuspid annular reentry.    G. RF energy application.  A total of 11 RF energy applications including       eight bonus RF energy applications were delivered.  During the second       RF energy application, atrial flutter was terminated and sinus rhythm       restored.  During the third RF energy application, isthmus block was       present.  Following ablation, there was no residual isthmus conduction. V. Conclusion: This study demonstrates typical atrial flutter with successful    electrophysiologic study and RF catheter ablation of atrial flutter with a    total of 11 RF energy applications including eight bonus RF energy    applications terminating atrial flutter, restoring sinus rhythm, and    creating bidirectional block in the atrial flutter isthmus. Dictated by:   Doylene Canning. Ladona Ridgel, M.D. [facility] Attending Physician:  Lewayne Bunting DD:  04/17/01 TD:  04/18/01 Job: [id number]

## 2010-06-17 NOTE — Discharge Summary (Signed)
[place]. [facility]  Patient:    [name] [name], [name] [name] Visit Number: [id number] MRN: [id number]          Service Type: OBV Location: [id number] Attending Physician:  Lewayne Bunting Dictated by:   Chinita Pester, C.R.N.P. Admit Date:  04/17/2001 Discharge Date: 04/18/2001   CC:         Darci Needle, M.D., [facility]  De Nurse, M.D.  Doylene Canning. Ladona Ridgel, M.D. [facility]  Tawni Millers, M.D.   Discharge Summary  HISTORY OF PRESENT ILLNESS:  This 77 year old gentleman with a past medical history of hypertension, diabetes, bipolar disorder is being admitted for TEE, atrial flutter ablation.  HOSPITAL COURSE:  The patient was admitted and underwent a TEE which showed RA without masses or clot.  He then underwent a radiofrequency ablation of atrial flutter.  He tolerated the procedure well and had no immediate postoperative complications.  The patients blood pressure the following day was 126/64, and his Cardizem was discontinued.  Atenolol dosage was decreased to 50 mg daily, and he was started on Coumadin.  The patient also was to decrease Hytrin to 2.5 mg daily.  The patient prior to discharge had 120 mg injection of subcutaneous Lovenox.  DISCHARGE MEDICATIONS:  (These medications were discussed with the patient and his wife.) 1. Glipizide 10 mg.  The patient stated he was taking 2 tablets a day twice    a day, sometimes every third day.  He was instructed to take the glipizide    as previously ordered by his physician. 2. Hydrochlorothiazide 25 mg daily. 3. Hytrin decreased to 2.5 mg daily. 4. Lotensin 40 mg 2 tablets daily, same as before. 5. Carbamazepine 200 mg nightly, same as before. 6. Trihexyphenidyl 2.5 mg nightly as before. 7. Coumadin 7.5 mg nightly, new.  He was given 5 mg pills and instructed    to take 1-1/2 tablets nightly. 8. Atenolol dose was decreased to 50 mg daily.  He was instructed to take    1/2 pill of what he had at home  which were 100 mg tablets. 9. Aspirin dose was decreased to 81 mg daily, and he was to stop Cardizem.  ACTIVITY:  He was not to do any heavy lifting or strength activity for the next four days and no driving.  DIET:  Low-fat, low-cholesterol, low-salt, ADA diet.  WOUND CARE:  He was allowed to shower.  He was to call if he developed any drainage or lump in his groin or neck.  FOLLOWUP:  A PT was scheduled for the patient at the [facility]. An appointment was scheduled for the patient at the [facility] on Friday, March 21, at 11 a.m.  He was also scheduled to see Dr. Tenny Craw on March 24 at 10 a.m for a blood pressure check.  He was also scheduled to see the physician assistant at [facility] on April 4 at 12 noon for a groin check, a right neck check, EKG, and blood pressure check.  A followup appointment was scheduled with Dr. Ladona Ridgel on April 20 at 11:45 a.m.  The patient was to continue to see Dr. Garnette Scheuermann at previously scheduled appointment. Dictated by:   Chinita Pester, C.R.N.P. Attending Physician:  Lewayne Bunting DD:  04/18/01 TD:  04/19/01 Job: [id number]

## 2011-07-27 ENCOUNTER — Other Ambulatory Visit (HOSPITAL_COMMUNITY): Payer: Self-pay | Admitting: Nephrology

## 2011-07-27 DIAGNOSIS — N186 End stage renal disease: Secondary | ICD-10-CM

## 2011-08-01 ENCOUNTER — Other Ambulatory Visit (HOSPITAL_COMMUNITY): Payer: Self-pay | Admitting: Interventional Radiology

## 2011-08-01 ENCOUNTER — Ambulatory Visit (HOSPITAL_COMMUNITY)
Admission: RE | Admit: 2011-08-01 | Discharge: 2011-08-01 | Disposition: A | Discharge status: Home / Self Care | Payer: Medicare Other | Source: Ambulatory Visit | Attending: Nephrology | Admitting: Nephrology

## 2011-08-01 DIAGNOSIS — I871 Compression of vein: Secondary | ICD-10-CM | POA: Insufficient documentation

## 2011-08-01 DIAGNOSIS — N186 End stage renal disease: Secondary | ICD-10-CM

## 2011-08-01 DIAGNOSIS — Y832 Surgical operation with anastomosis, bypass or graft as the cause of abnormal reaction of the patient, or of later complication, without mention of misadventure at the time of the procedure: Secondary | ICD-10-CM | POA: Insufficient documentation

## 2011-08-01 DIAGNOSIS — T82898A Other specified complication of vascular prosthetic devices, implants and grafts, initial encounter: Secondary | ICD-10-CM | POA: Insufficient documentation

## 2011-08-01 MED ORDER — IOHEXOL 300 MG/ML  SOLN
100.0000 mL | Freq: Once | INTRAMUSCULAR | Status: AC | PRN
  Administered 2011-08-01: 32 mL via INTRAVENOUS

## 2011-08-01 NOTE — Procedures (Signed)
LUE AV fistulagram Mult venous stenoses Pt wants to return for sedation No comp

## 2011-08-02 ENCOUNTER — Telehealth (HOSPITAL_COMMUNITY): Payer: Self-pay | Admitting: *Deleted

## 2011-08-02 NOTE — Telephone Encounter (Signed)
Line was busy

## 2011-08-04 ENCOUNTER — Other Ambulatory Visit: Payer: Self-pay | Admitting: Radiology

## 2011-08-08 ENCOUNTER — Inpatient Hospital Stay (HOSPITAL_COMMUNITY): Admission: RE | Admit: 2011-08-08 | Payer: Medicare Other | Source: Ambulatory Visit

## 2011-08-15 ENCOUNTER — Other Ambulatory Visit: Payer: Self-pay | Admitting: Nephrology

## 2011-08-15 ENCOUNTER — Ambulatory Visit
Admission: RE | Admit: 2011-08-15 | Discharge: 2011-08-15 | Disposition: A | Discharge status: Home / Self Care | Payer: Medicare Other | Source: Ambulatory Visit | Attending: Nephrology | Admitting: Nephrology

## 2011-08-15 DIAGNOSIS — R05 Cough: Secondary | ICD-10-CM

## 2012-01-10 ENCOUNTER — Encounter (HOSPITAL_COMMUNITY): Payer: Self-pay | Admitting: Adult Health

## 2012-01-10 ENCOUNTER — Emergency Department (HOSPITAL_COMMUNITY): Payer: Medicare Other

## 2012-01-10 ENCOUNTER — Inpatient Hospital Stay (HOSPITAL_COMMUNITY)
Admission: EM | Admit: 2012-01-10 | Discharge: 2012-01-14 | DRG: 308 | Disposition: A | Discharge status: Home / Self Care | Payer: Medicare Other | Attending: Internal Medicine | Admitting: Internal Medicine

## 2012-01-10 DIAGNOSIS — H5440 Blindness, one eye, unspecified eye: Secondary | ICD-10-CM | POA: Insufficient documentation

## 2012-01-10 DIAGNOSIS — I1311 Hypertensive heart and chronic kidney disease without heart failure, with stage 5 chronic kidney disease, or end stage renal disease: Secondary | ICD-10-CM | POA: Diagnosis present

## 2012-01-10 DIAGNOSIS — E1122 Type 2 diabetes mellitus with diabetic chronic kidney disease: Secondary | ICD-10-CM | POA: Diagnosis present

## 2012-01-10 DIAGNOSIS — I495 Sick sinus syndrome: Secondary | ICD-10-CM

## 2012-01-10 DIAGNOSIS — E119 Type 2 diabetes mellitus without complications: Secondary | ICD-10-CM

## 2012-01-10 DIAGNOSIS — R000 Tachycardia, unspecified: Secondary | ICD-10-CM | POA: Diagnosis present

## 2012-01-10 DIAGNOSIS — Z79899 Other long term (current) drug therapy: Secondary | ICD-10-CM

## 2012-01-10 DIAGNOSIS — G609 Hereditary and idiopathic neuropathy, unspecified: Secondary | ICD-10-CM | POA: Diagnosis present

## 2012-01-10 DIAGNOSIS — N186 End stage renal disease: Secondary | ICD-10-CM

## 2012-01-10 DIAGNOSIS — Z992 Dependence on renal dialysis: Secondary | ICD-10-CM

## 2012-01-10 DIAGNOSIS — I10 Essential (primary) hypertension: Secondary | ICD-10-CM

## 2012-01-10 DIAGNOSIS — I4892 Unspecified atrial flutter: Secondary | ICD-10-CM

## 2012-01-10 DIAGNOSIS — I4891 Unspecified atrial fibrillation: Principal | ICD-10-CM

## 2012-01-10 DIAGNOSIS — H409 Unspecified glaucoma: Secondary | ICD-10-CM | POA: Diagnosis present

## 2012-01-10 DIAGNOSIS — Z7982 Long term (current) use of aspirin: Secondary | ICD-10-CM

## 2012-01-10 DIAGNOSIS — G629 Polyneuropathy, unspecified: Secondary | ICD-10-CM

## 2012-01-10 HISTORY — DX: Gout, unspecified: M10.9

## 2012-01-10 HISTORY — DX: Polyneuropathy, unspecified: G62.9

## 2012-01-10 HISTORY — DX: Essential (primary) hypertension: I10

## 2012-01-10 HISTORY — DX: Anemia, unspecified: D64.9

## 2012-01-10 HISTORY — DX: Unspecified glaucoma: H40.9

## 2012-01-10 LAB — MAGNESIUM: Magnesium: 2.9 mg/dL — ABNORMAL HIGH (ref 1.5–2.5)

## 2012-01-10 LAB — CBC
HCT: 36.8 % — ABNORMAL LOW (ref 39.0–52.0)
Hemoglobin: 12.5 g/dL — ABNORMAL LOW (ref 13.0–17.0)
MCH: 30.9 pg (ref 26.0–34.0)
MCHC: 34 g/dL (ref 30.0–36.0)
MCV: 91.1 fL (ref 78.0–100.0)
Platelets: 237 10*3/uL (ref 150–400)
RBC: 4.04 MIL/uL — ABNORMAL LOW (ref 4.22–5.81)
RDW: 13.3 % (ref 11.5–15.5)
WBC: 5.3 10*3/uL (ref 4.0–10.5)

## 2012-01-10 LAB — BASIC METABOLIC PANEL
BUN: 25 mg/dL — ABNORMAL HIGH (ref 6–23)
CO2: 34 mEq/L — ABNORMAL HIGH (ref 19–32)
Calcium: 9.5 mg/dL (ref 8.4–10.5)
Chloride: 95 mEq/L — ABNORMAL LOW (ref 96–112)
Creatinine, Ser: 6.49 mg/dL — ABNORMAL HIGH (ref 0.50–1.35)
GFR calc Af Amer: 9 mL/min — ABNORMAL LOW (ref >90)
GFR calc non Af Amer: 7 mL/min — ABNORMAL LOW (ref >90)
Glucose, Bld: 191 mg/dL — ABNORMAL HIGH (ref 70–99)
Potassium: 3.5 mEq/L (ref 3.5–5.1)
Sodium: 141 mEq/L (ref 135–145)

## 2012-01-10 LAB — PROTIME-INR
INR: 1.18 (ref 0.00–1.49)
Prothrombin Time: 14.8 seconds (ref 11.6–15.2)

## 2012-01-10 LAB — POCT I-STAT TROPONIN I: Troponin i, poc: 0.01 ng/mL (ref 0.00–0.08)

## 2012-01-10 LAB — PRO B NATRIURETIC PEPTIDE: Pro B Natriuretic peptide (BNP): 3616 pg/mL — ABNORMAL HIGH (ref 0–450)

## 2012-01-10 MED ORDER — ONDANSETRON HCL 4 MG/2ML IJ SOLN: 4.0000 mg | Freq: Four times a day (QID) | INTRAMUSCULAR | Status: DC | PRN

## 2012-01-10 MED ORDER — WARFARIN SODIUM 10 MG PO TABS
10.0000 mg | ORAL_TABLET | Freq: Once | ORAL | Status: AC
Start: 2012-01-10 — End: 2012-01-11
  Administered 2012-01-11: 10 mg via ORAL
  Filled 2012-01-10 (×2): qty 1

## 2012-01-10 MED ORDER — PREDNISOLONE ACETATE 1 % OP SUSP
1.0000 [drp] | Freq: Four times a day (QID) | OPHTHALMIC | Status: DC
  Administered 2012-01-10 – 2012-01-13 (×8): 1 [drp] via OPHTHALMIC
  Filled 2012-01-10: qty 1

## 2012-01-10 MED ORDER — HEPARIN (PORCINE) IN NACL 100-0.45 UNIT/ML-% IJ SOLN
1950.0000 [IU]/h | INTRAMUSCULAR | Status: DC
  Administered 2012-01-10: 1400 [IU]/h via INTRAVENOUS
  Filled 2012-01-10 (×3): qty 250

## 2012-01-10 MED ORDER — CALCIUM ACETATE 667 MG PO TABS: 2.0000 | ORAL_TABLET | Freq: Three times a day (TID) | ORAL | Status: DC

## 2012-01-10 MED ORDER — CLONIDINE HCL 0.2 MG PO TABS
0.2000 mg | ORAL_TABLET | Freq: Two times a day (BID) | ORAL | Status: DC
  Administered 2012-01-12 (×2): 0.2 mg via ORAL
  Filled 2012-01-10 (×7): qty 1

## 2012-01-10 MED ORDER — CALCIUM ACETATE 667 MG PO CAPS
1334.0000 mg | ORAL_CAPSULE | Freq: Three times a day (TID) | ORAL | Status: DC
  Administered 2012-01-11 – 2012-01-12 (×4): 1334 mg via ORAL
  Administered 2012-01-13 – 2012-01-14 (×3): 667 mg via ORAL
  Filled 2012-01-10 (×13): qty 2

## 2012-01-10 MED ORDER — SIMVASTATIN 20 MG PO TABS
20.0000 mg | ORAL_TABLET | Freq: Every day | ORAL | Status: DC
  Administered 2012-01-10: 20 mg via ORAL
  Filled 2012-01-10 (×2): qty 1

## 2012-01-10 MED ORDER — HEPARIN SODIUM (PORCINE) 5000 UNIT/ML IJ SOLN: 5000.0000 [IU] | Freq: Three times a day (TID) | INTRAMUSCULAR | Status: DC

## 2012-01-10 MED ORDER — DILTIAZEM HCL 100 MG IV SOLR
5.0000 mg/h | Freq: Once | INTRAVENOUS | Status: AC
  Administered 2012-01-10: 10 mg/h via INTRAVENOUS
  Filled 2012-01-10: qty 100

## 2012-01-10 MED ORDER — ALUM & MAG HYDROXIDE-SIMETH 200-200-20 MG/5ML PO SUSP: 30.0000 mL | Freq: Four times a day (QID) | ORAL | Status: DC | PRN

## 2012-01-10 MED ORDER — ASPIRIN EC 81 MG PO TBEC
81.0000 mg | DELAYED_RELEASE_TABLET | Freq: Every day | ORAL | Status: DC
  Administered 2012-01-11 – 2012-01-14 (×4): 81 mg via ORAL
  Filled 2012-01-10 (×5): qty 1

## 2012-01-10 MED ORDER — DILTIAZEM HCL 25 MG/5ML IV SOLN
10.0000 mg | Freq: Once | INTRAVENOUS | Status: AC
  Administered 2012-01-10: 10 mg via INTRAVENOUS
  Filled 2012-01-10: qty 5

## 2012-01-10 MED ORDER — HYDROCODONE-ACETAMINOPHEN 5-325 MG PO TABS: 1.0000 | ORAL_TABLET | ORAL | Status: DC | PRN

## 2012-01-10 MED ORDER — ONDANSETRON HCL 4 MG PO TABS: 4.0000 mg | ORAL_TABLET | Freq: Four times a day (QID) | ORAL | Status: DC | PRN

## 2012-01-10 MED ORDER — MORPHINE SULFATE 2 MG/ML IJ SOLN: 1.0000 mg | INTRAMUSCULAR | Status: DC | PRN

## 2012-01-10 MED ORDER — DILTIAZEM HCL 100 MG IV SOLR
5.0000 mg/h | INTRAVENOUS | Status: DC
  Administered 2012-01-10: 10 mg/h via INTRAVENOUS
  Administered 2012-01-11 (×2): 15 mg/h via INTRAVENOUS
  Filled 2012-01-10 (×5): qty 100

## 2012-01-10 MED ORDER — HEPARIN BOLUS VIA INFUSION
5000.0000 [IU] | Freq: Once | INTRAVENOUS | Status: AC
  Administered 2012-01-10: 5000 [IU] via INTRAVENOUS

## 2012-01-10 MED ORDER — PREDNISOLONE ACETATE 0.12 % OP SUSP
1.0000 [drp] | Freq: Four times a day (QID) | OPHTHALMIC | Status: DC
  Filled 2012-01-10: qty 5

## 2012-01-10 MED ORDER — SODIUM CHLORIDE 0.9 % IJ SOLN
3.0000 mL | Freq: Two times a day (BID) | INTRAMUSCULAR | Status: DC
  Administered 2012-01-12 – 2012-01-13 (×3): 3 mL via INTRAVENOUS

## 2012-01-10 MED ORDER — ACETAMINOPHEN 650 MG RE SUPP: 650.0000 mg | Freq: Four times a day (QID) | RECTAL | Status: DC | PRN

## 2012-01-10 MED ORDER — RENA-VITE PO TABS
1.0000 | ORAL_TABLET | Freq: Every day | ORAL | Status: DC
  Administered 2012-01-11 – 2012-01-14 (×4): 1 via ORAL
  Filled 2012-01-10 (×5): qty 1

## 2012-01-10 MED ORDER — TRAZODONE 25 MG HALF TABLET
25.0000 mg | ORAL_TABLET | Freq: Every evening | ORAL | Status: DC | PRN
  Filled 2012-01-10: qty 1

## 2012-01-10 MED ORDER — WARFARIN - PHARMACIST DOSING INPATIENT: Freq: Every day | Status: DC

## 2012-01-10 MED ORDER — ACETAMINOPHEN 325 MG PO TABS: 650.0000 mg | ORAL_TABLET | Freq: Four times a day (QID) | ORAL | Status: DC | PRN

## 2012-01-10 MED ORDER — INSULIN ASPART 100 UNIT/ML ~~LOC~~ SOLN
0.0000 [IU] | Freq: Three times a day (TID) | SUBCUTANEOUS | Status: DC
  Administered 2012-01-11: 3 [IU] via SUBCUTANEOUS
  Administered 2012-01-12: 2 [IU] via SUBCUTANEOUS
  Administered 2012-01-12: 1 [IU] via SUBCUTANEOUS
  Administered 2012-01-13: 2 [IU] via SUBCUTANEOUS
  Administered 2012-01-14: 1 [IU] via SUBCUTANEOUS

## 2012-01-10 MED ORDER — LATANOPROST 0.005 % OP SOLN
1.0000 [drp] | Freq: Every day | OPHTHALMIC | Status: DC
  Administered 2012-01-10 – 2012-01-13 (×5): 1 [drp] via OPHTHALMIC
  Filled 2012-01-10: qty 2.5

## 2012-01-10 MED ORDER — BENAZEPRIL HCL 40 MG PO TABS
40.0000 mg | ORAL_TABLET | Freq: Two times a day (BID) | ORAL | Status: DC
  Administered 2012-01-10 – 2012-01-14 (×8): 40 mg via ORAL
  Filled 2012-01-10 (×9): qty 1

## 2012-01-10 MED ORDER — HYDROXYZINE HCL 25 MG PO TABS
25.0000 mg | ORAL_TABLET | Freq: Two times a day (BID) | ORAL | Status: DC
  Administered 2012-01-11 – 2012-01-14 (×8): 25 mg via ORAL
  Filled 2012-01-10 (×9): qty 1

## 2012-01-10 NOTE — ED Provider Notes (Signed)
History    78 year old male sent from dialysis for evaluation of tachycardia. Patient was nearing the end of dialysis when he was noted to have a heart rate up to the 150s. Patient denies palpitations. He has no acute complaints. Denies chest pain or shortness of breath. Per review her records, patient has a history of atrial flutter and status post radiofrequency ablation approximately 10 years ago.  CSN: [id number]  Arrival date & time 01/10/12  1619   First MD Initiated Contact with Patient 01/10/12 1631      Chief Complaint  Patient presents with  . Tachycardia    (Consider location/radiation/quality/duration/timing/severity/associated sxs/prior treatment) HPI  Past Medical History  Diagnosis Date  . Renal disorder   . Diabetes mellitus without complication   . Atrial fibrillation   . Hypertension   . Glaucoma   . Peripheral neuropathy   . Gout   . Anemia     History reviewed. No pertinent past surgical history.  History reviewed. No pertinent family history.  History  Substance Use Topics  . Smoking status: Never Smoker   . Smokeless tobacco: Not on file  . Alcohol Use: No      Review of Systems   Review of symptoms negative unless otherwise noted in HPI.\   Allergies  Review of patient's allergies indicates no known allergies.  Home Medications   Current Outpatient Rx  Name  Route  Sig  Dispense  Refill  . ASPIRIN EC 81 MG PO TBEC   Oral   Take 81 mg by mouth daily.         Marland Kitchen BENAZEPRIL HCL 40 MG PO TABS   Oral   Take 40 mg by mouth 2 (two) times daily.         Marland Kitchen CALCIUM ACETATE 667 MG PO TABS   Oral   Take 2 capsules by mouth 3 (three) times daily.         Marland Kitchen CLONIDINE HCL 0.2 MG PO TABS   Oral   Take 0.2 mg by mouth 2 (two) times daily.         Marland Kitchen GLIPIZIDE 10 MG PO TABS   Oral   Take 10 mg by mouth 2 (two) times daily before a meal.         . HYDROXYZINE HCL 25 MG PO TABS   Oral   Take 25 mg by mouth 2 (two) times daily.         Marland Kitchen LATANOPROST 0.005 % OP SOLN   Right Eye   Place 1 drop into the right eye at bedtime.         Marland Kitchen RENA-VITE PO TABS   Oral   Take 1 tablet by mouth daily.         Marland Kitchen PREDNISOLONE ACETATE 0.12 % OP SUSP   Left Eye   Place 1 drop into the left eye 4 (four) times daily.         Marland Kitchen SIMVASTATIN 20 MG PO TABS   Oral   Take 20 mg by mouth every evening.           There were no vitals taken for this visit.  Physical Exam  Nursing note and vitals reviewed. Constitutional: He appears well-developed and well-nourished. No distress.  HENT:  Head: Normocephalic and atraumatic.  Eyes: Conjunctivae normal are normal. Right eye exhibits no discharge. Left eye exhibits no discharge.       Left eye opaque  Neck: Neck supple.  Cardiovascular: Regular rhythm and normal  heart sounds.  Exam reveals no gallop and no friction rub.   No murmur heard.      Significant tachycardia with a regular rhythm  Pulmonary/Chest: Effort normal and breath sounds normal. No respiratory distress.  Abdominal: Soft. He exhibits no distension. There is no tenderness.  Musculoskeletal: He exhibits no edema and no tenderness.  Neurological: He is alert. No cranial nerve deficit. Coordination normal.  Skin: Skin is warm and dry.  Psychiatric: He has a normal mood and affect. His behavior is normal. Thought content normal.    ED Course  Procedures (including critical care time)  Labs Reviewed  CBC - Abnormal; Notable for the following:    RBC 4.04 (*)     Hemoglobin 12.5 (*)     HCT 36.8 (*)     All other components within normal limits  BASIC METABOLIC PANEL - Abnormal; Notable for the following:    Chloride 95 (*)     CO2 34 (*)     Glucose, Bld 191 (*)     BUN 25 (*)     Creatinine, Ser 6.49 (*)     GFR calc non Af Amer 7 (*)     GFR calc Af Amer 9 (*)     All other components within normal limits  PRO B NATRIURETIC PEPTIDE - Abnormal; Notable for the following:    Pro B Natriuretic  peptide (BNP) 3616.0 (*)     All other components within normal limits  MAGNESIUM - Abnormal; Notable for the following:    Magnesium 2.9 (*)     All other components within normal limits  POCT I-STAT TROPONIN I   Dg Chest Port 1 View  01/10/2012  *RADIOLOGY REPORT*  Clinical Data: Tachycardia, cough, hypertension, diabetes, end- stage renal disease on dialysis  PORTABLE CHEST - 1 VIEW  Comparison: Portable exam 1713 hours compared to 01/04/2012  Findings: Upper normal-sized cardiac silhouette. Mediastinal contours and pulmonary vascularity normal. Minimal right basilar atelectasis and bronchitic changes. No gross infiltrate, pleural effusion or pneumothorax. Bones unremarkable.  IMPRESSION: Minimal bronchitic changes and right basilar atelectasis.   Original Report Authenticated By: Ulyses Southward, M.D.     EKG:  Rhythm: Regular, narrow complex tachycardia with a rate of 153. Likely atrial fibrillation with 2:1 conduction Rate: 153 Axis: Normal Intervals: Normal ST segments: Nonspecific ST changes Comparison: Rate and rhythm change from previous EKG from February 2012. Patient was in sinus bradycardia with first degree AV block.   1. Atrial flutter       MDM  78 year old male with tachycardia. Narrow complex regular tachycardia w/ consistent rate around 150. Likely atrial flutter with 2:1 conduction. Patient has a history of the same. Previously seen by Verdis Prime. Status post radiofrequency ablation by Dr. Ladona Ridgel in 2003.  Patient denies any chest pain, lightheadedness,  Dizziness or shortness of breath. Normotensive. We'll attempt to control with Cardizem at this time. Basic labs. Will discuss with cardiology.  Repeat EKG with variable conduction. Flutter waves clearly seen.  6:20 PM Discussed case with Dr. Anne Fu, cardiology. Will evaluate pt in ED.         Raeford Razor, MD 01/11/12 [phone]

## 2012-01-10 NOTE — Progress Notes (Signed)
Deaccess left upper arm HD graft w/ slight bleeding noted, pressure drsg applied for 20 to 30. Minute. Primari RN at bedside.

## 2012-01-10 NOTE — ED Notes (Signed)
Hx of afib, dialysis pt while having dialysis today had a sudden onset of rapid heart rate up to 150s. Dialysis fistula is accessed. Pt did not finish dialysis, got 3/4 way done. Pt has no complaints. Dialysis center concerned due to tachycardcia, normal rate is 80s. At this time Hr 154, regular, with unifocal PVCs. Pt denies dizziness, pain and nausea.

## 2012-01-10 NOTE — H&P (Signed)
[facility] History and Physical  [name] [name] AVW:[id number] DOB: 11-12-1933 DOA: 01/10/2012  Referring physician: Raeford Razor, MD PCP: Dyke Maes, MD  Specialists: Verdis Prime, [facility] cardiology  Chief Complaint: Tachycardia  HPI: [name] [name] is a 78 y.o. African American male with past medical history of ESRD, diabetes mellitus and hypertension. Patient does have history of atrial fibrillation status post ablation. He was sent to the hospital from his dialysis center for evaluation of significant tachycardia. Patient said he had some chest congestion the day before yesterday and he went to his primary care physician office, he was placed on antibiotic yesterday and was given a breathing treatment then he went to his dialysis this morning and they told him his heart is beating too fast. For him he couldn't tell if he has tachycardia of his heart is skipping beats. He denies any chest pain, denies any shortness of breath or dizziness. Initial evaluation in the emergency department showed atrial flutter with variable conduction, patient is started on Cardizem drip and cardiology was consulted.  Review of Systems:  Constitutional: negative for anorexia, fevers and sweats Eyes: negative for irritation, redness and visual disturbance Ears, nose, mouth, throat, and face: negative for earaches, epistaxis, nasal congestion and sore throat Respiratory: Had some congestion yesterday but he denied it today. Cardiovascular: negative for chest pain, dyspnea, lower extremity edema, orthopnea, palpitations and syncope Gastrointestinal: negative for abdominal pain, constipation, diarrhea, melena, nausea and vomiting Genitourinary:negative for dysuria, frequency and hematuria Hematologic/lymphatic: negative for bleeding, easy bruising and lymphadenopathy Musculoskeletal:negative for arthralgias, muscle weakness and stiff joints Neurological: negative for coordination problems, gait  problems, headaches and weakness Endocrine: negative for diabetic symptoms including polydipsia, polyuria and weight loss Allergic/Immunologic: negative for anaphylaxis, hay fever and urticaria   Past Medical History  Diagnosis Date  . Renal disorder   . Diabetes mellitus without complication   . Atrial fibrillation   . Hypertension   . Glaucoma   . Peripheral neuropathy   . Gout   . Anemia    History reviewed. No pertinent past surgical history. Social History:  reports that he has never smoked. He does not have any smokeless tobacco history on file. He reports that he does not drink alcohol or use illicit drugs. Lives at home.  No Known Allergies  Family history denies any premature CAD  Prior to Admission medications   Medication Sig Start Date End Date Taking? Authorizing Provider  aspirin EC 81 MG tablet Take 81 mg by mouth daily.   Yes Historical Provider, MD  benazepril (LOTENSIN) 40 MG tablet Take 40 mg by mouth 2 (two) times daily.   Yes Historical Provider, MD  Calcium Acetate 667 MG TABS Take 2 capsules by mouth 3 (three) times daily.   Yes Historical Provider, MD  cloNIDine (CATAPRES) 0.2 MG tablet Take 0.2 mg by mouth 2 (two) times daily.   Yes Historical Provider, MD  glipiZIDE (GLUCOTROL) 10 MG tablet Take 10 mg by mouth 2 (two) times daily before a meal.   Yes Historical Provider, MD  hydrOXYzine (ATARAX/VISTARIL) 25 MG tablet Take 25 mg by mouth 2 (two) times daily.   Yes Historical Provider, MD  latanoprost (XALATAN) 0.005 % ophthalmic solution Place 1 drop into the right eye at bedtime.   Yes Historical Provider, MD  multivitamin (RENA-VIT) TABS tablet Take 1 tablet by mouth daily.   Yes Historical Provider, MD  prednisoLONE acetate (PRED MILD) 0.12 % ophthalmic suspension Place 1 drop into the left eye 4 (four) times daily.  Yes Historical Provider, MD  simvastatin (ZOCOR) 20 MG tablet Take 20 mg by mouth every evening.   Yes Historical Provider, MD    Physical Exam: Filed Vitals:   01/10/12 1715 01/10/12 1745 01/10/12 1815 01/10/12 1845  BP: 132/86 91/76 116/59 131/60  Pulse: 156 151 46 51  Resp: 24 17 20 12   SpO2: 99% 100% 100% 99%   General appearance: alert, cooperative and no distress  Head: Normocephalic, without obvious abnormality, atraumatic  Eyes: Left eye showed cornea covered with white discoloration Nose: Nares normal. Septum midline. Mucosa normal. No drainage or sinus tenderness.  Throat: lips, mucosa, and tongue normal; teeth and gums normal  Neck: Supple, no masses, no cervical lymphadenopathy, no JVD appreciated, no meningeal signs Resp: clear to auscultation bilaterally  Chest wall: no tenderness  Cardio: Irregularly irregular rhythm, no murmur, click, rub or gallop  GI: soft, non-tender; bowel sounds normal; no masses, no organomegaly  Extremities: extremities normal, atraumatic, no cyanosis or edema  Skin: Skin color, texture, turgor normal. No rashes or lesions  Neurologic: Alert and oriented X 3, normal strength and tone. Normal symmetric reflexes. Normal coordination and gait   Labs on Admission:  Basic Metabolic Panel:  Lab 01/10/12 8295  NA 141  K 3.5  CL 95*  CO2 34*  GLUCOSE 191*  BUN 25*  CREATININE 6.49*  CALCIUM 9.5  MG 2.9*  PHOS --   Liver Function Tests: No results found for this basename: AST:5,ALT:5,ALKPHOS:5,BILITOT:5,PROT:5,ALBUMIN:5 in the last 168 hours No results found for this basename: LIPASE:5,AMYLASE:5 in the last 168 hours No results found for this basename: AMMONIA:5 in the last 168 hours CBC:  Lab 01/10/12 1658  WBC 5.3  NEUTROABS --  HGB 12.5*  HCT 36.8*  MCV 91.1  PLT 237   Cardiac Enzymes: No results found for this basename: CKTOTAL:5,CKMB:5,CKMBINDEX:5,TROPONINI:5 in the last 168 hours  BNP (last 3 results)  Basename 01/10/12 1658  PROBNP 3616.0*   CBG: No results found for this basename: GLUCAP:5 in the last 168 hours  Radiological Exams on  Admission: Dg Chest Port 1 View  01/10/2012  *RADIOLOGY REPORT*  Clinical Data: Tachycardia, cough, hypertension, diabetes, end- stage renal disease on dialysis  PORTABLE CHEST - 1 VIEW  Comparison: Portable exam 1713 hours compared to 01/04/2012  Findings: Upper normal-sized cardiac silhouette. Mediastinal contours and pulmonary vascularity normal. Minimal right basilar atelectasis and bronchitic changes. No gross infiltrate, pleural effusion or pneumothorax. Bones unremarkable.  IMPRESSION: Minimal bronchitic changes and right basilar atelectasis.   Original Report Authenticated By: Ulyses Southward, M.D.     EKG: Independently reviewed. *  Assessment/Plan Principal Problem:  *Atrial flutter Active Problems:  Hypertension  Peripheral neuropathy  Diabetes mellitus without complication  ESRD (end stage renal disease)   Atrial flutter/fibrillation -He does have history of atrial flutter with ablation in the past. -He denies any active chest pain, SOB or palpitations. -Started on Cardizem drip, cardiology to manage. First set of cardiac enzymes negative. -Will check TSH, patient is not on be the blocker but cardiology to manage.  ESRD -Patient had his dialysis today, he said he almost completed it, he is missing about 45-60 minutes of it. -Per ED, nephrology was called for dialysis.  Hypertension -Patient is on benazepril and clonidine, these were continued. -We might need to discontinue one of these medications as probable introduction of Cardizem and/or beta blockers might drop the blood pressure.  Diabetes mellitus -Patient started on diabetic diet and check hemoglobin A1c. -Patient is on glipizide, hold  while he is in the hospital, utilize insulin sliding scale.  Chest congestion -He mentioned that he had some chest congestion for which she was started on antibiotics. -He denies any fever chills, chest x-ray clear. I will hold antibiotics for now.  Code Status: Full code Family  Communication: Wife is at bedside Disposition Plan: Inpatient, telemetry  Time spent: 70 minutes  Estefania Kamiya A [facility] Pager [phone]  If 7PM-7AM, please contact night-coverage [email or web address] Password [email or web address] 01/10/2012, 7:10 PM

## 2012-01-10 NOTE — Consult Note (Signed)
Admit date: 01/10/2012 Referring Physician  Dr. Juleen China Primary Physician Katy Apo, MD Primary Cardiologist  Dr. Katrinka Blazing Reason for Consultation  Atrial fibrillation/flutter  HPI: 78 year old male with diabetes, end-stage renal disease on hemodialysis who earlier today began dialysis with tachycardia which never resolved, completed about three fourths of his session and was then stopped and sent here for further evaluation. He does not feel his heart beating quickly although currently he is in atrial fibrillation with a heart rate between 150 and 160. He denies any shortness of breath or chest pain. Recently, he has had an upper respiratory infection for which Dr. Nehemiah Settle has been treating with antibiotics with a recent injection yesterday and oral antibiotics before that. He has also been on Robitussin cough medicine but denies any Sudafed.  In review of his records, in 2003 Dr. Lewayne Bunting performed atrial flutter ablation.    PMH:   Past Medical History  Diagnosis Date  . Renal disorder   . Diabetes mellitus without complication   . Atrial fibrillation   . Hypertension   . Glaucoma   . Peripheral neuropathy   . Gout   . Anemia     PSH:  History reviewed. No pertinent past surgical history. Allergies:  Review of patient's allergies indicates no known allergies. Prior to Admit Meds:   (Not in a hospital admission) Prior to Admission medications   Medication Sig Start Date End Date Taking? Authorizing Provider  aspirin EC 81 MG tablet Take 81 mg by mouth daily.   Yes Historical Provider, MD  benazepril (LOTENSIN) 40 MG tablet Take 40 mg by mouth 2 (two) times daily.   Yes Historical Provider, MD  Calcium Acetate 667 MG TABS Take 2 capsules by mouth 3 (three) times daily.   Yes Historical Provider, MD  cloNIDine (CATAPRES) 0.2 MG tablet Take 0.2 mg by mouth 2 (two) times daily.   Yes Historical Provider, MD  glipiZIDE (GLUCOTROL) 10 MG tablet Take 10 mg by mouth 2 (two) times  daily before a meal.   Yes Historical Provider, MD  hydrOXYzine (ATARAX/VISTARIL) 25 MG tablet Take 25 mg by mouth 2 (two) times daily.   Yes Historical Provider, MD  latanoprost (XALATAN) 0.005 % ophthalmic solution Place 1 drop into the right eye at bedtime.   Yes Historical Provider, MD  multivitamin (RENA-VIT) TABS tablet Take 1 tablet by mouth daily.   Yes Historical Provider, MD  prednisoLONE acetate (PRED MILD) 0.12 % ophthalmic suspension Place 1 drop into the left eye 4 (four) times daily.   Yes Historical Provider, MD  simvastatin (ZOCOR) 20 MG tablet Take 20 mg by mouth every evening.   Yes Historical Provider, MD    Fam HX:   History reviewed. No pertinent family history. Social HX:    History   Social History  . Marital Status: Married    Spouse Name: N/A    Number of Children: N/A  . Years of Education: N/A   Occupational History  . Not on file.   Social History Main Topics  . Smoking status: Never Smoker   . Smokeless tobacco: Not on file  . Alcohol Use: No  . Drug Use: No  . Sexually Active:    Other Topics Concern  . Not on file   Social History Narrative  . No narrative on file     ROS: Recent upper respiratory infection, mucus production, cough. Denies any current fever, no strokelike symptoms, no bleeding, no rashes, no chest pain, no shortness of breath. All  11 ROS were addressed and are negative except what is stated in the HPI  Physical Exam: Blood pressure 131/60, pulse 51, resp. rate 12, SpO2 99.00%.    General: Well developed, well nourished, elderly in no acute distress Head: Left eye with significant corneal clouding, No xanthomas.   Normal cephalic and atramatic  Lungs:  Right lower lobe mild inspiratory wheeze observed. Normal respiratory effort. no rales. Heart:  Irreg Irreg tachycardic Pulses are diminished lower extremities.            No carotid bruit. No JVD.  No abdominal bruits.  Abdomen: Bowel sounds are positive, abdomen soft and  non-tender without masses. No hepatosplenomegaly. Msk:  Back normal. Normal strength and tone for age. Extremities:   No clubbing, cyanosis or edema.   Neuro: Alert and oriented X 3, non-focal, MAE x 4 GU: Deferred Rectal: Deferred Psych:  Good affect, responds appropriately    Labs:   Lab Results  Component Value Date   WBC 5.3 01/10/2012   HGB 12.5* 01/10/2012   HCT 36.8* 01/10/2012   MCV 91.1 01/10/2012   PLT 237 01/10/2012    Lab 01/10/12 1658  NA 141  K 3.5  CL 95*  CO2 34*  BUN 25*  CREATININE 6.49*  CALCIUM 9.5  PROT --  BILITOT --  ALKPHOS --  ALT --  AST --  GLUCOSE 191*   No results found for this basename: PTT        Radiology:  Dg Chest Port 1 View  01/10/2012  *RADIOLOGY REPORT*  Clinical Data: Tachycardia, cough, hypertension, diabetes, end- stage renal disease on dialysis  PORTABLE CHEST - 1 VIEW  Comparison: Portable exam 1713 hours compared to 01/04/2012  Findings: Upper normal-sized cardiac silhouette. Mediastinal contours and pulmonary vascularity normal. Minimal right basilar atelectasis and bronchitic changes. No gross infiltrate, pleural effusion or pneumothorax. Bones unremarkable.  IMPRESSION: Minimal bronchitic changes and right basilar atelectasis.   Original Report Authenticated By: Ulyses Southward, M.D.    Personally viewed.  EKG:   2-1 atypical atrial flutter, LVH, nonspecific ST-T wave changes. Currently on telemetry, atrial fibrillation with variable conduction. Personally viewed.   Echocardiogram: 10/2009 1. Moderate concentric left ventricular hypertrophy. 2. Left ventricular ejection fraction estimated by 2D at 55-60 percent. 3. Moderate left atrial enlargement. 4. Analysis of mitral valve inflow, pulmonary vein Doppler and tissue Doppler suggests grade II diastolic dysfunction with elevated left atrial pressure. 5. Trivial tricuspid regurgitation. 6. Normal right ventricular size and function. 7. Trace aortic valve regurgitation. 8.  The aortic root is normal.  Chest x-ray 01/04/12-no evidence of pneumonia. No evidence of heart failure. Chronic lung disease present.  ASSESSMENT/PLAN:   78 year old male with diabetes, end-stage renal disease, hypertension, prior atrial flutter ablation in 2003 here with atypical atrial flutter with 2 to one conduction as well as what appears to be atrial fibrillation currently on telemetry with heart rates ranging from 110 through 153 with recent upper respiratory infection.  Atrial fibrillation/atypical atrial flutter-it is likely that his current arrhythmia would not be amenable with right sided/right atrial ablative procedure but would in fact require more extensive transseptal/left atrial ablation. At this point, continue to strive for rate control with IV diltiazem which is currently at 10 mg per hour but certainly may be increased to 15. If diltiazem alone is not successful, one may certainly add metoprolol. It is definitely possible that his atrial arrhythmia has been precipitated by his upper respiratory infection. Currently he is afebrile, normal white count. I  would go ahead and start anticoagulation with IV heparin. I would also recommend Coumadin. CHADS is 3. If he does not auto convert overnight, TEE cardioversion remains a possibility. We'll make n.p.o. past midnight. He does not require urgent cardioversion tonight. He is hemodynamically stable. No angina. Previously, he remembers being fatigued with his atrial flutter. Prior ejection fraction was normal in 2011. TSH pending.   Hypertension-agree with Dr. Arthor Captain that one of his antihypertensives may need to be decreased if diltiazem decreases his blood pressure.  Diabetes-per primary team.  Upper respiratory infection-recent antibiotics, mild wheeze heard in right lower lobe. Normal white count. Seems to be improving. Avoid decongestants such as Sudafed.  Chronic kidney disease stage V-hemodialysis.  We will follow along.  Donato Schultz, MD  01/10/2012  7:24 PM

## 2012-01-10 NOTE — Progress Notes (Signed)
ANTICOAGULATION CONSULT NOTE - Initial Consult  Pharmacy Consult for Heparin/Coumadin Indication: atrial fibrillation  No Known Allergies  Patient Measurements: Height: 6\' 3"  (190.5 cm) Weight: 225 lb (102.059 kg) IBW/kg (Calculated) : 84.5  Heparin Dosing Weight: 102kg  Vital Signs: BP: 107/83 mmHg (12/11 1915) Pulse Rate: 71  (12/11 1915)  Labs:  Basename 01/10/12 1658  HGB 12.5*  HCT 36.8*  PLT 237  APTT --  LABPROT --  INR --  HEPARINUNFRC --  CREATININE 6.49*  CKTOTAL --  CKMB --  TROPONINI --    Estimated Creatinine Clearance: 12.5 ml/min (by C-G formula based on Cr of 6.49).   Medical History: Past Medical History  Diagnosis Date  . Renal disorder   . Diabetes mellitus without complication   . Atrial fibrillation   . Hypertension   . Glaucoma   . Peripheral neuropathy   . Gout   . Anemia     Medications:   (Not in a hospital admission)  Assessment: 78 y/o male patient admitted with acute afib, has h/o aflutter ablation and has been on coumadin in the past. Plan to begin ufh and coumadin given CHADS2=3.   Goal of Therapy:  INR 2-3 Xa=0.3-0.7 Monitor platelets by anticoagulation protocol: Yes   Plan:  Heparin 5000 unit IV bolus followed by infusion at 1400 units/hr. Coumadin 10mg  today. Check 8 hour heparin level with daily cbc, protime and heparin level.  Verlene Mayer, PharmD, BCPS Pager [phone] 01/10/2012,8:28 PM

## 2012-01-11 ENCOUNTER — Other Ambulatory Visit: Payer: Self-pay | Admitting: Interventional Cardiology

## 2012-01-11 ENCOUNTER — Encounter (HOSPITAL_COMMUNITY): Payer: Self-pay | Admitting: General Practice

## 2012-01-11 DIAGNOSIS — I4892 Unspecified atrial flutter: Secondary | ICD-10-CM

## 2012-01-11 DIAGNOSIS — I4891 Unspecified atrial fibrillation: Principal | ICD-10-CM

## 2012-01-11 DIAGNOSIS — I495 Sick sinus syndrome: Secondary | ICD-10-CM | POA: Diagnosis present

## 2012-01-11 LAB — BASIC METABOLIC PANEL
Calcium: 9 mg/dL (ref 8.4–10.5)
Creatinine, Ser: 8.46 mg/dL — ABNORMAL HIGH (ref 0.50–1.35)
GFR calc non Af Amer: 5 mL/min — ABNORMAL LOW (ref >90)
Sodium: 139 mEq/L (ref 135–145)

## 2012-01-11 LAB — GLUCOSE, CAPILLARY
Glucose-Capillary: 261 mg/dL — ABNORMAL HIGH (ref 70–99)
Glucose-Capillary: 208 mg/dL — ABNORMAL HIGH (ref 70–99)
Glucose-Capillary: 116 mg/dL — ABNORMAL HIGH (ref 70–99)

## 2012-01-11 LAB — PROTIME-INR
INR: 1.27 (ref 0.00–1.49)
Prothrombin Time: 15.6 seconds — ABNORMAL HIGH (ref 11.6–15.2)

## 2012-01-11 LAB — CBC
Hemoglobin: 10.6 g/dL — ABNORMAL LOW (ref 13.0–17.0)
Platelets: 200 10*3/uL (ref 150–400)
RBC: 3.33 MIL/uL — ABNORMAL LOW (ref 4.22–5.81)
WBC: 5.4 10*3/uL (ref 4.0–10.5)

## 2012-01-11 LAB — HEMOGLOBIN A1C
Hgb A1c MFr Bld: 7.4 % — ABNORMAL HIGH (ref <5.7)
Mean Plasma Glucose: 166 mg/dL — ABNORMAL HIGH (ref <117)

## 2012-01-11 LAB — HEPARIN LEVEL (UNFRACTIONATED): Heparin Unfractionated: 0.13 IU/mL — ABNORMAL LOW (ref 0.30–0.70)

## 2012-01-11 MED ORDER — HEPARIN BOLUS VIA INFUSION
3000.0000 [IU] | Freq: Once | INTRAVENOUS | Status: AC
  Administered 2012-01-11: 3000 [IU] via INTRAVENOUS
  Filled 2012-01-11: qty 3000

## 2012-01-11 MED ORDER — WARFARIN SODIUM 10 MG PO TABS
10.0000 mg | ORAL_TABLET | Freq: Once | ORAL | Status: AC
  Administered 2012-01-11: 10 mg via ORAL
  Filled 2012-01-11: qty 1

## 2012-01-11 MED ORDER — PARICALCITOL 5 MCG/ML IV SOLN
8.0000 ug | INTRAVENOUS | Status: DC
  Administered 2012-01-12: 8 ug via INTRAVENOUS
  Filled 2012-01-11: qty 1.6

## 2012-01-11 MED ORDER — DARBEPOETIN ALFA-POLYSORBATE 40 MCG/0.4ML IJ SOLN: 40.0000 ug | INTRAMUSCULAR | Status: DC

## 2012-01-11 MED ORDER — ATORVASTATIN CALCIUM 10 MG PO TABS
10.0000 mg | ORAL_TABLET | Freq: Every day | ORAL | Status: DC
  Administered 2012-01-11 – 2012-01-13 (×3): 10 mg via ORAL
  Filled 2012-01-11 (×4): qty 1

## 2012-01-11 MED ORDER — GLIPIZIDE 5 MG PO TABS
5.0000 mg | ORAL_TABLET | Freq: Two times a day (BID) | ORAL | Status: DC
  Administered 2012-01-11 – 2012-01-14 (×6): 5 mg via ORAL
  Filled 2012-01-11 (×9): qty 1

## 2012-01-11 MED ORDER — ENOXAPARIN SODIUM 100 MG/ML ~~LOC~~ SOLN
95.0000 mg | SUBCUTANEOUS | Status: DC
  Administered 2012-01-11 – 2012-01-13 (×3): 95 mg via SUBCUTANEOUS
  Filled 2012-01-11 (×4): qty 1

## 2012-01-11 NOTE — Progress Notes (Signed)
UR Completed Alaynah Schutter Graves-Bigelow, RN,BSN [phone]  

## 2012-01-11 NOTE — Care Management Note (Unsigned)
    Page 1 of 1   01/11/2012     12:26:35 PM   CARE MANAGEMENT NOTE 01/11/2012  Patient:  [name] [name], [name] [name]   Account Number:  [id number]  Date Initiated:  01/11/2012  Documentation initiated by:  GRAVES-BIGELOW,Lakiah Dhingra  Subjective/Objective Assessment:   Pt admitted with Atrial fibrillation/flutter. Pt initiated on iv cardizem gtt.     Action/Plan:   CM will continue to monitor for disposition needs.   Anticipated DC Date:  01/14/2012   Anticipated DC Plan:  HOME W HOME HEALTH SERVICES         Choice offered to / List presented to:             Status of service:  In process, will continue to follow Medicare Important Message given?   (If response is "NO", the following Medicare IM given date fields will be blank) Date Medicare IM given:   Date Additional Medicare IM given:    Discharge Disposition:    Per UR Regulation:  Reviewed for med. necessity/level of care/duration of stay  If discussed at Long Length of Stay Meetings, dates discussed:    Comments:

## 2012-01-11 NOTE — Progress Notes (Signed)
Subjective: Patient doing well without fever chills shortness of breath without any wheezing. He is unaware of this arrhythmia. Cardiology input appreciated. Rate still slightly elevated. Otherwise patient hemodynamically stable  Objective: Vital signs in last 24 hours: Temp:  [98.3 F (36.8 C)-98.4 F (36.9 C)] 98.4 F (36.9 C) (12/12 0500) Pulse Rate:  [46-156] 108  (12/12 0500) Resp:  [12-24] 20  (12/12 0500) BP: (91-134)/(59-86) 134/79 mmHg (12/12 0500) SpO2:  [98 %-100 %] 98 % (12/12 0500) Weight:  [95.482 kg (210 lb 8 oz)-102.059 kg (225 lb)] 95.482 kg (210 lb 8 oz) (12/11 2105) Weight change:  Last BM Date: 01/10/12  Intake/Output from previous day:   Intake/Output this shift:    General appearance: alert and cooperative Resp: clear to auscultation bilaterally Cardio: irregularly irregular rhythm Extremities: extremities normal, atraumatic, no cyanosis or edema  Lab Results:  Results for orders placed during the hospital encounter of 01/10/12 (from the past 24 hour(s))  CBC     Status: Abnormal   Collection Time   01/10/12  4:58 PM      Component Value Range   WBC 5.3  4.0 - 10.5 K/uL   RBC 4.04 (*) 4.22 - 5.81 MIL/uL   Hemoglobin 12.5 (*) 13.0 - 17.0 g/dL   HCT 16.1 (*) 09.6 - 04.5 %   MCV 91.1  78.0 - 100.0 fL   MCH 30.9  26.0 - 34.0 pg   MCHC 34.0  30.0 - 36.0 g/dL   RDW 40.9  81.1 - 91.4 %   Platelets 237  150 - 400 K/uL  BASIC METABOLIC PANEL     Status: Abnormal   Collection Time   01/10/12  4:58 PM      Component Value Range   Sodium 141  135 - 145 mEq/L   Potassium 3.5  3.5 - 5.1 mEq/L   Chloride 95 (*) 96 - 112 mEq/L   CO2 34 (*) 19 - 32 mEq/L   Glucose, Bld 191 (*) 70 - 99 mg/dL   BUN 25 (*) 6 - 23 mg/dL   Creatinine, Ser 7.82 (*) 0.50 - 1.35 mg/dL   Calcium 9.5  8.4 - 95.6 mg/dL   GFR calc non Af Amer 7 (*) >90 mL/min   GFR calc Af Amer 9 (*) >90 mL/min  PRO B NATRIURETIC PEPTIDE     Status: Abnormal   Collection Time   01/10/12  4:58 PM      Component Value Range   Pro B Natriuretic peptide (BNP) 3616.0 (*) 0 - 450 pg/mL  MAGNESIUM     Status: Abnormal   Collection Time   01/10/12  4:58 PM      Component Value Range   Magnesium 2.9 (*) 1.5 - 2.5 mg/dL  POCT I-STAT TROPONIN I     Status: Normal   Collection Time   01/10/12  5:15 PM      Component Value Range   Troponin i, poc 0.01  0.00 - 0.08 ng/mL   Comment 3           GLUCOSE, CAPILLARY     Status: Abnormal   Collection Time   01/10/12  7:53 PM      Component Value Range   Glucose-Capillary 168 (*) 70 - 99 mg/dL  TSH     Status: Normal   Collection Time   01/10/12  9:06 PM      Component Value Range   TSH 1.974  0.350 - 4.500 uIU/mL  HEMOGLOBIN A1C  Status: Abnormal   Collection Time   01/10/12  9:06 PM      Component Value Range   Hemoglobin A1C 7.4 (*) <5.7 %   Mean Plasma Glucose 166 (*) <117 mg/dL  PROTIME-INR     Status: Normal   Collection Time   01/10/12  9:06 PM      Component Value Range   Prothrombin Time 14.8  11.6 - 15.2 seconds   INR 1.18  0.00 - 1.49  GLUCOSE, CAPILLARY     Status: Abnormal   Collection Time   01/11/12  1:20 AM      Component Value Range   Glucose-Capillary 261 (*) 70 - 99 mg/dL   Comment 1 Notify RN    HEPARIN LEVEL (UNFRACTIONATED)     Status: Abnormal   Collection Time   01/11/12  5:10 AM      Component Value Range   Heparin Unfractionated 0.13 (*) 0.30 - 0.70 IU/mL  CBC     Status: Abnormal   Collection Time   01/11/12  5:10 AM      Component Value Range   WBC 5.4  4.0 - 10.5 K/uL   RBC 3.33 (*) 4.22 - 5.81 MIL/uL   Hemoglobin 10.6 (*) 13.0 - 17.0 g/dL   HCT 16.1 (*) 09.6 - 04.5 %   MCV 93.4  78.0 - 100.0 fL   MCH 31.8  26.0 - 34.0 pg   MCHC 34.1  30.0 - 36.0 g/dL   RDW 40.9  81.1 - 91.4 %   Platelets 200  150 - 400 K/uL  PROTIME-INR     Status: Abnormal   Collection Time   01/11/12  5:10 AM      Component Value Range   Prothrombin Time 15.6 (*) 11.6 - 15.2 seconds   INR 1.27  0.00 - 1.49  BASIC  METABOLIC PANEL     Status: Abnormal   Collection Time   01/11/12  5:10 AM      Component Value Range   Sodium 139  135 - 145 mEq/L   Potassium 3.8  3.5 - 5.1 mEq/L   Chloride 97  96 - 112 mEq/L   CO2 31  19 - 32 mEq/L   Glucose, Bld 160 (*) 70 - 99 mg/dL   BUN 35 (*) 6 - 23 mg/dL   Creatinine, Ser 7.82 (*) 0.50 - 1.35 mg/dL   Calcium 9.0  8.4 - 95.6 mg/dL   GFR calc non Af Amer 5 (*) >90 mL/min   GFR calc Af Amer 6 (*) >90 mL/min  GLUCOSE, CAPILLARY     Status: Abnormal   Collection Time   01/11/12  7:34 AM      Component Value Range   Glucose-Capillary 126 (*) 70 - 99 mg/dL      Studies/Results: Dg Chest Port 1 View  01/10/2012  *RADIOLOGY REPORT*  Clinical Data: Tachycardia, cough, hypertension, diabetes, end- stage renal disease on dialysis  PORTABLE CHEST - 1 VIEW  Comparison: Portable exam 1713 hours compared to 01/04/2012  Findings: Upper normal-sized cardiac silhouette. Mediastinal contours and pulmonary vascularity normal. Minimal right basilar atelectasis and bronchitic changes. No gross infiltrate, pleural effusion or pneumothorax. Bones unremarkable.  IMPRESSION: Minimal bronchitic changes and right basilar atelectasis.   Original Report Authenticated By: Ulyses Southward, M.D.     Medications:  Prior to Admission:  Prescriptions prior to admission  Medication Sig Dispense Refill  . aspirin EC 81 MG tablet Take 81 mg by mouth daily.      Marland Kitchen  benazepril (LOTENSIN) 40 MG tablet Take 40 mg by mouth 2 (two) times daily.      . Calcium Acetate 667 MG TABS Take 2 capsules by mouth 3 (three) times daily.      . cloNIDine (CATAPRES) 0.2 MG tablet Take 0.2 mg by mouth 2 (two) times daily.      Marland Kitchen glipiZIDE (GLUCOTROL) 10 MG tablet Take 10 mg by mouth 2 (two) times daily before a meal.      . hydrOXYzine (ATARAX/VISTARIL) 25 MG tablet Take 25 mg by mouth 2 (two) times daily.      Marland Kitchen latanoprost (XALATAN) 0.005 % ophthalmic solution Place 1 drop into the right eye at bedtime.      .  multivitamin (RENA-VIT) TABS tablet Take 1 tablet by mouth daily.      . prednisoLONE acetate (PRED MILD) 0.12 % ophthalmic suspension Place 1 drop into the left eye 4 (four) times daily.      . simvastatin (ZOCOR) 20 MG tablet Take 20 mg by mouth every evening.       Scheduled:   . aspirin EC  81 mg Oral Daily  . benazepril  40 mg Oral BID  . calcium acetate  1,334 mg Oral TID WC  . cloNIDine  0.2 mg Oral BID  . [COMPLETED] diltiazem (CARDIZEM) infusion  5-15 mg/hr Intravenous Once  . [COMPLETED] diltiazem  10 mg Intravenous Once  . [COMPLETED] heparin  3,000 Units Intravenous Once  . [COMPLETED] heparin  5,000 Units Intravenous Once  . hydrOXYzine  25 mg Oral BID  . insulin aspart  0-9 Units Subcutaneous TID WC  . latanoprost  1 drop Right Eye QHS  . multivitamin  1 tablet Oral Daily  . prednisoLONE acetate  1 drop Left Eye QID  . simvastatin  20 mg Oral q1800  . sodium chloride  3 mL Intravenous Q12H  . [COMPLETED] warfarin  10 mg Oral Once  . warfarin  10 mg Oral ONCE-1800  . Warfarin - Pharmacist Dosing Inpatient   Does not apply q1800  . [DISCONTINUED] Calcium Acetate  2 capsule Oral TID  . [DISCONTINUED] heparin  5,000 Units Subcutaneous Q8H  . [DISCONTINUED] prednisoLONE acetate  1 drop Left Eye QID   Continuous:   . diltiazem (CARDIZEM) infusion 15 mg/hr (01/11/12 0544)  . heparin 1,800 Units/hr (01/11/12 1610)    Assessment/Plan: @HPROL @  Principal Problem:  *Atrial flutter Active Problems:  Hypertension  Peripheral neuropathy  Diabetes mellitus without complication  ESRD (end stage renal disease)  Atrial fibrillation bronchitis, clinically resolved  Continue current treatment as outlined by cardiology I have discussed with patient in detail Coumadin might be indicated.    LOS: 1 day   Braylea Brancato D 01/11/2012, 8:50 AM

## 2012-01-11 NOTE — Consult Note (Signed)
[facility] Renal Consultation Note  Indication for Consultation:  Management of ESRD/hemodialysis; anemia, hypertension/volume and secondary hyperparathyroidism  HPI: [name] [name] is a 78 y.o. male on hd MWF sent to ER yesterday from outpt. HD unit with reported  " heart rate  to be 150 to 120 range on hd and pt.asymptomatic with bp stable." per RN at [facility].Patient does have history of atrial fibrillation status post ablation. He reported some URI symptoms with  Reported"  chest congestion with no fevers and the day before yesterday and he went to his primary care physician office, he give me an Antibiotic shot and was given a breathing treatment." He couldn't tell if he has tachycardia and had no chest pain / dizziness a or sob. Noted his  evaluation in the emergency department showed atrial flutter with variable conduction, patient was started on Cardizem drip and cardiology was consulted.        Past Medical History  Diagnosis Date  . Renal disorder   . Diabetes mellitus without complication   . Atrial fibrillation   . Hypertension   . Glaucoma   . Peripheral neuropathy   . Gout   . Anemia     Past Surgical History  Procedure Date  . Ablation of dysrhythmic focus      History reviewed. No pertinent family history. Social= Married with one grown son and  reports that he has never smoked. He has never used smokeless tobacco. He reports that he does not drink alcohol or use illicit drugs.  No Known Allergies  Prior to Admission medications   Medication Sig Start Date End Date Taking? Authorizing Provider  aspirin EC 81 MG tablet Take 81 mg by mouth daily.   Yes Historical Provider, MD  benazepril (LOTENSIN) 40 MG tablet Take 40 mg by mouth 2 (two) times daily.   Yes Historical Provider, MD  Calcium Acetate 667 MG TABS Take 2 capsules by mouth 3 (three) times daily.   Yes Historical Provider, MD  cloNIDine (CATAPRES) 0.2 MG tablet Take 0.2 mg  by mouth 2 (two) times daily.   Yes Historical Provider, MD  glipiZIDE (GLUCOTROL) 10 MG tablet Take 10 mg by mouth 2 (two) times daily before a meal.   Yes Historical Provider, MD  hydrOXYzine (ATARAX/VISTARIL) 25 MG tablet Take 25 mg by mouth 2 (two) times daily.   Yes Historical Provider, MD  latanoprost (XALATAN) 0.005 % ophthalmic solution Place 1 drop into the right eye at bedtime.   Yes Historical Provider, MD  multivitamin (RENA-VIT) TABS tablet Take 1 tablet by mouth daily.   Yes Historical Provider, MD  prednisoLONE acetate (PRED MILD) 0.12 % ophthalmic suspension Place 1 drop into the left eye 4 (four) times daily.   Yes Historical Provider, MD  simvastatin (ZOCOR) 20 MG tablet Take 20 mg by mouth every evening.   Yes Historical Provider, MD    QMV:[id number], acetaminophen, alum & mag hydroxide-simeth, HYDROcodone-acetaminophen, morphine injection, ondansetron (ZOFRAN) IV, ondansetron, traZODone  Results for orders placed during the hospital encounter of 01/10/12 (from the past 48 hour(s))  CBC     Status: Abnormal   Collection Time   01/10/12  4:58 PM      Component Value Range Comment   WBC 5.3  4.0 - 10.5 K/uL    RBC 4.04 (*) 4.22 - 5.81 MIL/uL    Hemoglobin 12.5 (*) 13.0 - 17.0 g/dL    HCT 32.4 (*) 40.1 - 52.0 %    MCV 91.1  78.0 - 100.0 fL    MCH 30.9  26.0 - 34.0 pg    MCHC 34.0  30.0 - 36.0 g/dL    RDW 27.2  53.6 - 64.4 %    Platelets 237  150 - 400 K/uL   BASIC METABOLIC PANEL     Status: Abnormal   Collection Time   01/10/12  4:58 PM      Component Value Range Comment   Sodium 141  135 - 145 mEq/L    Potassium 3.5  3.5 - 5.1 mEq/L    Chloride 95 (*) 96 - 112 mEq/L    CO2 34 (*) 19 - 32 mEq/L    Glucose, Bld 191 (*) 70 - 99 mg/dL    BUN 25 (*) 6 - 23 mg/dL    Creatinine, Ser 0.34 (*) 0.50 - 1.35 mg/dL    Calcium 9.5  8.4 - 74.2 mg/dL    GFR calc non Af Amer 7 (*) >90 mL/min    GFR calc Af Amer 9 (*) >90 mL/min   PRO B NATRIURETIC PEPTIDE     Status:  Abnormal   Collection Time   01/10/12  4:58 PM      Component Value Range Comment   Pro B Natriuretic peptide (BNP) 3616.0 (*) 0 - 450 pg/mL   MAGNESIUM     Status: Abnormal   Collection Time   01/10/12  4:58 PM      Component Value Range Comment   Magnesium 2.9 (*) 1.5 - 2.5 mg/dL   POCT I-STAT TROPONIN I     Status: Normal   Collection Time   01/10/12  5:15 PM      Component Value Range Comment   Troponin i, poc 0.01  0.00 - 0.08 ng/mL    Comment 3            GLUCOSE, CAPILLARY     Status: Abnormal   Collection Time   01/10/12  7:53 PM      Component Value Range Comment   Glucose-Capillary 168 (*) 70 - 99 mg/dL   TSH     Status: Normal   Collection Time   01/10/12  9:06 PM      Component Value Range Comment   TSH 1.974  0.350 - 4.500 uIU/mL   HEMOGLOBIN A1C     Status: Abnormal   Collection Time   01/10/12  9:06 PM      Component Value Range Comment   Hemoglobin A1C 7.4 (*) <5.7 %    Mean Plasma Glucose 166 (*) <117 mg/dL   PROTIME-INR     Status: Normal   Collection Time   01/10/12  9:06 PM      Component Value Range Comment   Prothrombin Time 14.8  11.6 - 15.2 seconds    INR 1.18  0.00 - 1.49   GLUCOSE, CAPILLARY     Status: Abnormal   Collection Time   01/11/12  1:20 AM      Component Value Range Comment   Glucose-Capillary 261 (*) 70 - 99 mg/dL    Comment 1 Notify RN     HEPARIN LEVEL (UNFRACTIONATED)     Status: Abnormal   Collection Time   01/11/12  5:10 AM      Component Value Range Comment   Heparin Unfractionated 0.13 (*) 0.30 - 0.70 IU/mL   CBC     Status: Abnormal   Collection Time   01/11/12  5:10 AM      Component Value Range Comment  WBC 5.4  4.0 - 10.5 K/uL    RBC 3.33 (*) 4.22 - 5.81 MIL/uL    Hemoglobin 10.6 (*) 13.0 - 17.0 g/dL    HCT 96.0 (*) 45.4 - 52.0 %    MCV 93.4  78.0 - 100.0 fL    MCH 31.8  26.0 - 34.0 pg    MCHC 34.1  30.0 - 36.0 g/dL    RDW 09.8  11.9 - 14.7 %    Platelets 200  150 - 400 K/uL   PROTIME-INR     Status:  Abnormal   Collection Time   01/11/12  5:10 AM      Component Value Range Comment   Prothrombin Time 15.6 (*) 11.6 - 15.2 seconds    INR 1.27  0.00 - 1.49   BASIC METABOLIC PANEL     Status: Abnormal   Collection Time   01/11/12  5:10 AM      Component Value Range Comment   Sodium 139  135 - 145 mEq/L    Potassium 3.8  3.5 - 5.1 mEq/L    Chloride 97  96 - 112 mEq/L    CO2 31  19 - 32 mEq/L    Glucose, Bld 160 (*) 70 - 99 mg/dL    BUN 35 (*) 6 - 23 mg/dL    Creatinine, Ser 8.29 (*) 0.50 - 1.35 mg/dL    Calcium 9.0  8.4 - 56.2 mg/dL    GFR calc non Af Amer 5 (*) >90 mL/min    GFR calc Af Amer 6 (*) >90 mL/min   GLUCOSE, CAPILLARY     Status: Abnormal   Collection Time   01/11/12  7:34 AM      Component Value Range Comment   Glucose-Capillary 126 (*) 70 - 99 mg/dL   GLUCOSE, CAPILLARY     Status: Abnormal   Collection Time   01/11/12 11:18 AM      Component Value Range Comment   Glucose-Capillary 208 (*) 70 - 99 mg/dL      ROS: As above only positives in hpi   Physical Exam: Filed Vitals:   01/11/12 0500  BP: 134/79  Pulse: 108  Temp: 98.4 F (36.9 C)  Resp: 20     General: Alert Elderly Talkative BM, NAD , Appropriate, No cos, I" m ready to go home" HEENT: [place] , MMM, bearded  Eyes: EOMI. Left Corneal clouding Neck:  No jvd or bruits Heart: Irreg , Irreg with hrt rate in 70s, No rub or murmur    77 on hall telem Lungs: Faint lll wheezing otherwise CTA Abdomen: BS +=, soft ,non tender Extremities:  No pedal edema Skin:  No rash or overt Ulcer Neuro: OX#, moving all ext Dialysis Access:  Pos. Bruit L U  A AVF  Dialysis Orders: Center: [facility]  on MWF . EDW 94.5 kg HD [place] 2.0 k, 2.25 ca  Time  4.0 hrs Heparin 8400 units. Access  L U A AVF BFR 400  DFR 800    Zemplar 8000 mcg IV/HD Epogen 1600   Units IV/HD  Venofer  0  Other 0  Assessment/Plan 1. Atrial Fibrillation/ Atypical Atrial Flutter=  Cardiology seeing, Iv Hep / coumadin and cardizem rx/ ? Induced  Tachy rate with recent respiratory illness. 2. ESRD -  MWF HD ( [facility])= continue current schedule 3. Hypertension/volume  - wt 95.5 yesterday with admit CXR Minimal Bronchitic changes and Right basilar atelectasis, HD in am attempt to edw UF 2 l  as bp allows. / on Lotensin, catapres. ,and with Cardizem  May be able to taper other med's as needed if bp drops on hd.  4. Anemia  -  10.6 hgb  Use epo on hd 5. Metabolic bone disease -  zemplar on hd and binders/ fu am renal panel pre hd 7. DM type 2= per admit Lenny Pastel, PA-C [facility] [phone] 01/11/2012, 1:06 PM

## 2012-01-11 NOTE — Consult Note (Signed)
ELECTROPHYSIOLOGY CONSULT NOTE    Patient ID: [name] [name] MRN: [id number], DOB/AGE: January 17, 1934 78 y.o.  Admit date: 01/10/2012 Date of Consult: 01-11-2012  Primary Physician: Katy Apo, MD Primary Cardiologist: Dr Garnette Scheuermann  Reason for Consultation: tachy brady syndrome  HPI:  [name] is a 78 year old male with a past medical history significant for atrial flutter (s/p ablation in 2003 by Dr Ladona Ridgel), ESRD (on dialysis), hypertension, glaucoma, peripheral neuropathy, and bradycardia (unable to tolerate beta blockers in the past).  He was in dialysis yesterday and developed afib with RVR with rates in the 150's.  He was transferred to [facility] for further evaluation.  He was placed on a Diltiazem drip with subsequent control of his ventricular rates and occasional sinus beats.  He has also been placed on Lovenox for anti-coagulation.  Recently, he has had an upper respiratory infection which has been treated with antibiotics.  He denies taking any Sudafed.  He denies feelings of palpitations.  He also denies chest pain or shortness of breath.  Last echo 10-2009 demonstrated   1. Moderate concentric left ventricular hypertrophy.   2. Left ventricular ejection fraction estimated by 2D at 55-60 percent.   3. Moderate left atrial enlargement.   4. Analysis of mitral valve inflow, pulmonary vein Doppler and tissue Doppler suggests grade II   diastolic dysfunction with elevated left atrial pressure.   5. Trivial tricuspid regurgitation.   6. Normal right ventricular size and function.   7. Trace aortic valve regurgitation.   8. The aortic root is normal.  ROS is negative except as outlined above.  Past Medical History  Diagnosis Date  . Renal disorder   . Diabetes mellitus without complication   . Atrial fibrillation   . Hypertension   . Glaucoma   . Peripheral neuropathy   . Gout   . Anemia      Surgical History:  Past Surgical History  Procedure Date  .  Ablation of dysrhythmic focus      Prescriptions prior to admission  Medication Sig Dispense Refill  . aspirin EC 81 MG tablet Take 81 mg by mouth daily.      . benazepril (LOTENSIN) 40 MG tablet Take 40 mg by mouth 2 (two) times daily.      . Calcium Acetate 667 MG TABS Take 2 capsules by mouth 3 (three) times daily.      . cloNIDine (CATAPRES) 0.2 MG tablet Take 0.2 mg by mouth 2 (two) times daily.      Marland Kitchen glipiZIDE (GLUCOTROL) 10 MG tablet Take 10 mg by mouth 2 (two) times daily before a meal.      . hydrOXYzine (ATARAX/VISTARIL) 25 MG tablet Take 25 mg by mouth 2 (two) times daily.      Marland Kitchen latanoprost (XALATAN) 0.005 % ophthalmic solution Place 1 drop into the right eye at bedtime.      . multivitamin (RENA-VIT) TABS tablet Take 1 tablet by mouth daily.      . prednisoLONE acetate (PRED MILD) 0.12 % ophthalmic suspension Place 1 drop into the left eye 4 (four) times daily.      . simvastatin (ZOCOR) 20 MG tablet Take 20 mg by mouth every evening.        Inpatient Medications:    . aspirin EC  81 mg Oral Daily  . atorvastatin  10 mg Oral q1800  . benazepril  40 mg Oral BID  . calcium acetate  1,334 mg Oral TID WC  . cloNIDine  0.2 mg Oral BID  . darbepoetin (ARANESP) injection - DIALYSIS  40 mcg Intravenous Q Wed-HD  . enoxaparin (LOVENOX) injection  95 mg Subcutaneous Q24H  . glipiZIDE  5 mg Oral BID AC  . hydrOXYzine  25 mg Oral BID  . insulin aspart  0-9 Units Subcutaneous TID WC  . latanoprost  1 drop Right Eye QHS  . multivitamin  1 tablet Oral Daily  . paricalcitol  8 mcg Intravenous Q M,W,F-HD  . prednisoLONE acetate  1 drop Left Eye QID  . sodium chloride  3 mL Intravenous Q12H  . warfarin  10 mg Oral ONCE-1800  . Warfarin - Pharmacist Dosing Inpatient   Does not apply q1800   . diltiazem (CARDIZEM) infusion 15 mg/hr (01/11/12 0544)     Allergies: No Known Allergies  History   Social History  . Marital Status: Married    Spouse Name: N/A    Number of Children:  N/A  . Years of Education: N/A   Occupational History  . Not on file.   Social History Main Topics  . Smoking status: Never Smoker   . Smokeless tobacco: Never Used  . Alcohol Use: No  . Drug Use: No  . Sexually Active:     History reviewed. No pertinent family history.   Well appearing 78 yo man, NAD HEENT: Unremarkable Neck:  8 cm JVD, no thyromegally Lungs:  Clear except for minimal basilar rales,no wheezes HEART:  IRegular rate rhythm, grade 2/6 systolic murmur, no rubs, no clicks Abd:  Flat, positive bowel sounds, no organomegally, no rebound, no guarding Ext:  2 plus pulses, no edema, no cyanosis, no clubbing Skin:  No rashes no nodules Neuro:  CN II through XII intact, motor grossly intact  Labs:   Lab Results  Component Value Date   WBC 5.4 01/11/2012   HGB 10.6* 01/11/2012   HCT 31.1* 01/11/2012   MCV 93.4 01/11/2012   PLT 200 01/11/2012    Lab 01/11/12 0510  NA 139  K 3.8  CL 97  CO2 31  BUN 35*  CREATININE 8.46*  CALCIUM 9.0  PROT --  BILITOT --  ALKPHOS --  ALT --  AST --  GLUCOSE 160*   TSH- 1.974 Radiology/Studies: Dg Chest Port 1 View  01/10/2012  *RADIOLOGY REPORT*  Clinical Data: Tachycardia, cough, hypertension, diabetes, end- stage renal disease on dialysis  PORTABLE CHEST - 1 VIEW  Comparison: Portable exam 1713 hours compared to 01/04/2012  Findings: Upper normal-sized cardiac silhouette. Mediastinal contours and pulmonary vascularity normal. Minimal right basilar atelectasis and bronchitic changes. No gross infiltrate, pleural effusion or pneumothorax. Bones unremarkable.  IMPRESSION: Minimal bronchitic changes and right basilar atelectasis.   Original Report Authenticated By: Ulyses Southward, M.D.     EKG: prob 2:1 atypical atrial flutter rate 153  TELEMETRY: atrial fib with intermittent sinus beats, now controlled ventricular rate  A/P 1. Atrial flutter with RVR, likely left atrial based on QRS morphology 2. Atrial fibrillation 3.  ESRD on HD 4. Acute diastolic CHF, mild, due to his atrial arrhythmias 5. Mild sinus node dysfunction Rec: His situation is challenging. We would try to avoid a PPM if possible as his infection risk with an indwelling PPM not trivial. With ESRD on HD, his anti-arrhythmic drug option is really only amiodarone. I would recommend 200 mg daily and watch his heart rate carefully. He has been on clonidine which will slow his sinus rate and I would stop this medication and use other meds to control  bp if needed. Finally, I will defer decision on anti-coagulation to Dr. Katrinka Blazing. Novel agents are all contra-indicated with ESRD. He has had trouble with coumadin in the past.   Leonia Reeves.D.

## 2012-01-11 NOTE — Progress Notes (Addendum)
ANTICOAGULATION CONSULT NOTE - Follow Up Consult  Pharmacy Consult for Heparin/Coumadin Indication: atrial fibrillation  No Known Allergies  Patient Measurements: Height: 6\' 3"  (190.5 cm) Weight: 210 lb 8 oz (95.482 kg) IBW/kg (Calculated) : 84.5  Heparin Dosing Weight: 102kg  Vital Signs: Temp: 98 F (36.7 C) (12/12 1400) BP: 132/73 mmHg (12/12 1400) Pulse Rate: 59  (12/12 1400)  Labs:  Basename 01/11/12 1428 01/11/12 0510 01/10/12 2106 01/10/12 1658  HGB -- 10.6* -- 12.5*  HCT -- 31.1* -- 36.8*  PLT -- 200 -- 237  APTT -- -- -- --  LABPROT -- 15.6* 14.8 --  INR -- 1.27 1.18 --  HEPARINUNFRC 0.28* 0.13* -- --  CREATININE -- 8.46* -- 6.49*  CKTOTAL -- -- -- --  CKMB -- -- -- --  TROPONINI -- -- -- --    Estimated Creatinine Clearance: 8.9 ml/min (by C-G formula based on Cr of 8.46).   Medical History: Past Medical History  Diagnosis Date  . Renal disorder   . Diabetes mellitus without complication   . Atrial fibrillation   . Hypertension   . Glaucoma   . Peripheral neuropathy   . Gout   . Anemia     Medications:  Prescriptions prior to admission  Medication Sig Dispense Refill  . aspirin EC 81 MG tablet Take 81 mg by mouth daily.      . benazepril (LOTENSIN) 40 MG tablet Take 40 mg by mouth 2 (two) times daily.      . Calcium Acetate 667 MG TABS Take 2 capsules by mouth 3 (three) times daily.      . cloNIDine (CATAPRES) 0.2 MG tablet Take 0.2 mg by mouth 2 (two) times daily.      Marland Kitchen glipiZIDE (GLUCOTROL) 10 MG tablet Take 10 mg by mouth 2 (two) times daily before a meal.      . hydrOXYzine (ATARAX/VISTARIL) 25 MG tablet Take 25 mg by mouth 2 (two) times daily.      Marland Kitchen latanoprost (XALATAN) 0.005 % ophthalmic solution Place 1 drop into the right eye at bedtime.      . multivitamin (RENA-VIT) TABS tablet Take 1 tablet by mouth daily.      . prednisoLONE acetate (PRED MILD) 0.12 % ophthalmic suspension Place 1 drop into the left eye 4 (four) times daily.       . simvastatin (ZOCOR) 20 MG tablet Take 20 mg by mouth every evening.        Assessment: 78 y/o male patient admitted with acute afib on heparin and Coumadin.   Heparin level increased s/p last increase in heparin but at 0.28 is still slightly below goal with heparin at 1800 units/hr. No signs/symptoms of bleeding per RN.   Goal of Therapy:  INR 2-3 Xa=0.3-0.7 Monitor platelets by anticoagulation protocol: Yes   Plan:  1. Increase heparin to 1950 units/hr.  2. Heparin level in 8 hours.   Alison L. Illene Bolus, PharmD, BCPS Clinical Pharmacist Pager: [phone] Pharmacy: [phone] 01/11/2012 3:27 PM    Addendum 1630 pm Heparin changed to Lovenox  Plan: 1) Lovenox 95 mg sq Q 24 hours 2) Follow up plan  Thank you. Okey Regal, PharmD

## 2012-01-11 NOTE — Progress Notes (Signed)
ANTICOAGULATION CONSULT NOTE - Follow Up Consult  Pharmacy Consult for Heparin/Coumadin Indication: atrial fibrillation  No Known Allergies  Patient Measurements: Height: 6\' 3"  (190.5 cm) Weight: 210 lb 8 oz (95.482 kg) IBW/kg (Calculated) : 84.5  Heparin Dosing Weight: 102kg  Vital Signs: Temp: 98.3 F (36.8 C) (12/11 2105) BP: 120/86 mmHg (12/11 2105) Pulse Rate: 124  (12/11 2105)  Labs:  Basename 01/11/12 0510 01/10/12 2106 01/10/12 1658  HGB 10.6* -- 12.5*  HCT 31.1* -- 36.8*  PLT 200 -- 237  APTT -- -- --  LABPROT 15.6* 14.8 --  INR 1.27 1.18 --  HEPARINUNFRC 0.13* -- --  CREATININE -- -- 6.49*  CKTOTAL -- -- --  CKMB -- -- --  TROPONINI -- -- --    Estimated Creatinine Clearance: 11.6 ml/min (by C-G formula based on Cr of 6.49).   Medical History: Past Medical History  Diagnosis Date  . Renal disorder   . Diabetes mellitus without complication   . Atrial fibrillation   . Hypertension   . Glaucoma   . Peripheral neuropathy   . Gout   . Anemia     Medications:  Prescriptions prior to admission  Medication Sig Dispense Refill  . aspirin EC 81 MG tablet Take 81 mg by mouth daily.      . benazepril (LOTENSIN) 40 MG tablet Take 40 mg by mouth 2 (two) times daily.      . Calcium Acetate 667 MG TABS Take 2 capsules by mouth 3 (three) times daily.      . cloNIDine (CATAPRES) 0.2 MG tablet Take 0.2 mg by mouth 2 (two) times daily.      Marland Kitchen glipiZIDE (GLUCOTROL) 10 MG tablet Take 10 mg by mouth 2 (two) times daily before a meal.      . hydrOXYzine (ATARAX/VISTARIL) 25 MG tablet Take 25 mg by mouth 2 (two) times daily.      Marland Kitchen latanoprost (XALATAN) 0.005 % ophthalmic solution Place 1 drop into the right eye at bedtime.      . multivitamin (RENA-VIT) TABS tablet Take 1 tablet by mouth daily.      . prednisoLONE acetate (PRED MILD) 0.12 % ophthalmic suspension Place 1 drop into the left eye 4 (four) times daily.      . simvastatin (ZOCOR) 20 MG tablet Take 20 mg by  mouth every evening.        Assessment: 78 y/o male patient admitted with acute afib on heparin and Coumadin.   Heparin level (0.13) is below-goal on 1400 units/hr. INR is 1.27. Noted Hgb 10.6, no signs/symptoms of bleeding per RN.   Goal of Therapy:  INR 2-3 Xa=0.3-0.7 Monitor platelets by anticoagulation protocol: Yes   Plan:  1. Heparin IV bolus 3000 units x 1, then increase IV infusion to 1800 units/hr.  2. Heparin level in 8 hours.  3. Coumadin 10mg  po today.   Lorre Munroe, PharmD 01/11/2012,6:28 AM

## 2012-01-11 NOTE — Progress Notes (Signed)
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 He was admitted from dialysis after developing atrial fibrillation with markedly rapid rate. He was started on IV diltiazem and is controlled. He was unaware that his heart was out of rhythm when the dialysis staff told him what was going on. He has been on dialysis for 2 years. He has hypertensive heart disease.  TELEMETRY:  Atrial fibrillation with a controlled rate Filed Vitals:   01/10/12 2000 01/10/12 2105 01/11/12 0500 01/11/12 1400  BP: 106/68 120/86 134/79 132/73  Pulse: 97 124 108 59  Temp:  98.3 F (36.8 C) 98.4 F (36.9 C) 98 F (36.7 C)  Resp: 22 22 20 18   Height:  6\' 3"  (1.905 m)    Weight:  95.482 kg (210 lb 8 oz)    SpO2: 100% 98% 98% 97%   No intake or output data in the 24 hours ending 01/11/12 1546  LABS: Basic Metabolic Panel:  Basename 01/11/12 0510 01/10/12 1658  NA 139 141  K 3.8 3.5  CL 97 95*  CO2 31 34*  GLUCOSE 160* 191*  BUN 35* 25*  CREATININE 8.46* 6.49*  CALCIUM 9.0 9.5  MG -- 2.9*  PHOS -- --   CBC:  Basename 01/11/12 0510 01/10/12 1658  WBC 5.4 5.3  NEUTROABS -- --  HGB 10.6* 12.5*  HCT 31.1* 36.8*  MCV 93.4 91.1  PLT 200 237   Hemoglobin A1C:  Basename 01/10/12 2106  HGBA1C 7.4*   Fasting Lipid Panel: No results found for this basename: CHOL,HDL,LDLCALC,TRIG,CHOLHDL,LDLDIRECT in the last 72 hours  Radiology/Studies:  *RADIOLOGY REPORT*  Clinical Data: Tachycardia, cough, hypertension, diabetes, end-  stage renal disease on dialysis  PORTABLE CHEST - 1 VIEW  Comparison: Portable exam 1713 hours compared to 01/04/2012  Findings:  Upper normal-sized cardiac silhouette.  Mediastinal contours and pulmonary vascularity normal.  Minimal right basilar atelectasis and bronchitic changes.  No  gross infiltrate, pleural effusion or pneumothorax.  Bones unremarkable.  IMPRESSION:  Minimal bronchitic changes and right basilar atelectasis.  Original Report Authenticated By: Ulyses Southward, M.D.    Physical Exam: Blood pressure 132/73, pulse 59, temperature 98 F (36.7 C), resp. rate 18, height 6\' 3"  (1.905 m), weight 95.482 kg (210 lb 8 oz), SpO2 97.00%. Weight change:    Irregularly irregular rhythm. No murmur. Chest is clear to auscultation and percussion There is no edema.  ASSESSMENT:  1. Tachy-Brady Syndrome with prior atrial flutter(ablated), current atrial fibrillation with a RVR, and typical sinus bradycardia as an out patient that has required beta blocker avoidance for blood pressure control.  2. ESRD with chronic dialysis  3. Previous difficult to control hypertension.  Plan:  1. IV diltiazem for rate control 2. Consider TEE cardioversion.  3. May need pacer if antiarrhythmics are used. 4. Agree will need long term oral anticoagulation therapy 5. Will get EP opinion. 6. NPO after midnight.  Selinda Eon 01/11/2012, 3:46 PM

## 2012-01-12 LAB — RENAL FUNCTION PANEL
Albumin: 3.4 g/dL — ABNORMAL LOW (ref 3.5–5.2)
BUN: 50 mg/dL — ABNORMAL HIGH (ref 6–23)
Calcium: 8.9 mg/dL (ref 8.4–10.5)
Creatinine, Ser: 10.9 mg/dL — ABNORMAL HIGH (ref 0.50–1.35)
Glucose, Bld: 207 mg/dL — ABNORMAL HIGH (ref 70–99)
Phosphorus: 5 mg/dL — ABNORMAL HIGH (ref 2.3–4.6)
Potassium: 4 mEq/L (ref 3.5–5.1)

## 2012-01-12 LAB — CBC
HCT: 28.8 % — ABNORMAL LOW (ref 39.0–52.0)
MCHC: 33.7 g/dL (ref 30.0–36.0)
MCV: 91.1 fL (ref 78.0–100.0)
Platelets: 216 10*3/uL (ref 150–400)
RDW: 13.4 % (ref 11.5–15.5)
WBC: 5.6 10*3/uL (ref 4.0–10.5)

## 2012-01-12 LAB — GLUCOSE, CAPILLARY
Glucose-Capillary: 138 mg/dL — ABNORMAL HIGH (ref 70–99)
Glucose-Capillary: 84 mg/dL (ref 70–99)

## 2012-01-12 MED ORDER — PARICALCITOL 5 MCG/ML IV SOLN
INTRAVENOUS | Status: AC
  Filled 2012-01-12: qty 2

## 2012-01-12 MED ORDER — AMIODARONE HCL 200 MG PO TABS
200.0000 mg | ORAL_TABLET | Freq: Every day | ORAL | Status: DC
  Administered 2012-01-12 – 2012-01-14 (×3): 200 mg via ORAL
  Filled 2012-01-12 (×4): qty 1

## 2012-01-12 NOTE — Progress Notes (Signed)
Patient ID: [name] [name], male   DOB: 12-Dec-1933, 78 y.o.   MRN: [id number]   [facility] Progress Note    Subjective:   Reports to be feeling well and noted to have been transitioned off diltiazem gtt to amiodarone. Started on coumadin with lovenox bridge (initially thought would be a candidate for thrombin inhibitors)   Objective:   BP 120/61  Pulse 75  Temp 98.1 F (36.7 C)  Resp 17  Ht 6\' 3"  (1.905 m)  Wt 98.8 kg (217 lb 13 oz)  BMI 27.22 kg/m2  SpO2 93%  Physical Exam: GEX:[id number] on HD UUV:OZDGU irregular with rate controlled  Resp:CTA bilaterally, no rales/rhonchi YQI:HKVQ, flat, NT, BS normal Ext:No LE edema  Labs: BMET  Lab 01/12/12 0702 01/11/12 0510 01/10/12 1658  NA 133* 139 141  K 4.0 3.8 3.5  CL 92* 97 95*  CO2 25 31 34*  GLUCOSE 207* 160* 191*  BUN 50* 35* 25*  CREATININE 10.90* 8.46* 6.49*  ALB -- -- --  CALCIUM 8.9 9.0 9.5  PHOS 5.0* -- --   CBC  Lab 01/12/12 0500 01/11/12 0510 01/10/12 1658  WBC 5.6 5.4 5.3  NEUTROABS -- -- --  HGB 9.7* 10.6* 12.5*  HCT 28.8* 31.1* 36.8*  MCV 91.1 93.4 91.1  PLT 216 200 237   Medications:      . amiodarone  200 mg Oral Daily  . aspirin EC  81 mg Oral Daily  . atorvastatin  10 mg Oral q1800  . benazepril  40 mg Oral BID  . calcium acetate  1,334 mg Oral TID WC  . cloNIDine  0.2 mg Oral BID  . darbepoetin (ARANESP) injection - DIALYSIS  40 mcg Intravenous Q Wed-HD  . enoxaparin (LOVENOX) injection  95 mg Subcutaneous Q24H  . glipiZIDE  5 mg Oral BID AC  . hydrOXYzine  25 mg Oral BID  . insulin aspart  0-9 Units Subcutaneous TID WC  . latanoprost  1 drop Right Eye QHS  . multivitamin  1 tablet Oral Daily  . paricalcitol      . paricalcitol  8 mcg Intravenous Q M,W,F-HD  . prednisoLONE acetate  1 drop Left Eye QID  . sodium chloride  3 mL Intravenous Q12H     Assessment/ Plan:   1. Atrial Fibrillation/ Atypical Atrial Flutter: Cardiology directed switch to amiodarone from  diltiazem gtt. Now on anticoagulation due to high CHADS score with coumadin/lovenox bridge. 2. ESRD - MWF HD ( [facility])= continue current schedule 3. Hypertension/volume - wt 95.5 yesterday with admit CXR Minimal Bronchitic changes and Right basilar atelectasis, Attempt to get to EDW today 4. Anemia - Hgb drop noted- likely dilutional from gtts- continue ESA/ UF at HD 5. Metabolic bone disease - zemplar on hd and binders/ fu am renal panel pre hd       6.   DM type 2: per Dr.Polite  Zetta Bills, MD 01/12/2012, 8:53 AM

## 2012-01-12 NOTE — Consult Note (Signed)
THIS IS A LATE ENTRY COSIGN FOR THE PATIENT [name] [name] PERSONALLY SEEN ON 01/11/2012 AT 1305. I have personally seen and examined this patient and agree with the assessment/plan as outlined above by [facility].  Laura-Lee Villegas K.,MD 01/12/2012 8:51 AM

## 2012-01-12 NOTE — Procedures (Signed)
Patient seen on Hemodialysis. QB 400, UF goal Treatment adjusted as needed.  Zetta Bills MD [facility]. Office # [phone] Pager # [phone] 9:12 AM

## 2012-01-12 NOTE — Progress Notes (Signed)
Pt back from HD.  Denies pain.  Pt in and out of sinus rhythm.  Cardizem was already off when pt arrived.  Sent message to pharmacy to send up amiodarone.  No s/s of any acute distress noted.  Call bell in reach.

## 2012-01-12 NOTE — Progress Notes (Signed)
Report taken from HD nurse Hartford Poli.

## 2012-01-12 NOTE — Progress Notes (Signed)
Patient Name: [name] [name] Date of Encounter: 01/12/2012    SUBJECTIVE: He had spontaneous conversion from atrial fib to NSR/SB last PM. I appreciate the EP input from Dr. Ladona Ridgel. The patient is asymptomatic.  TELEMETRY:  NSR with PAC's Filed Vitals:   01/12/12 0648 01/12/12 0700 01/12/12 0730 01/12/12 0800  BP: 145/71 134/71 82/31 126/57  Pulse: 68 61 69 56  Temp:      Resp: 17 18 21 15   Height: 6\' 3"  (1.905 m)     Weight: 98.8 kg (217 lb 13 oz)     SpO2: 93%       Intake/Output Summary (Last 24 hours) at 01/12/12 0819 Last data filed at 01/11/12 1753  Gross per 24 hour  Intake 621.58 ml  Output      0 ml  Net 621.58 ml    LABS: Basic Metabolic Panel:  Basename 01/12/12 0702 01/11/12 0510 01/10/12 1658  NA 133* 139 --  K 4.0 3.8 --  CL 92* 97 --  CO2 25 31 --  GLUCOSE 207* 160* --  BUN 50* 35* --  CREATININE 10.90* 8.46* --  CALCIUM 8.9 9.0 --  MG -- -- 2.9*  PHOS 5.0* -- --   CBC:  Basename 01/12/12 0500 01/11/12 0510  WBC 5.6 5.4  NEUTROABS -- --  HGB 9.7* 10.6*  HCT 28.8* 31.1*  MCV 91.1 93.4  PLT 216 200   Hemoglobin A1C:  Basename 01/10/12 2106  HGBA1C 7.4*   Radiology/Studies:  CXR with no acute change.  Physical Exam: Blood pressure 126/57, pulse 56, temperature 98.1 F (36.7 C), resp. rate 15, height 6\' 3"  (1.905 m), weight 98.8 kg (217 lb 13 oz), SpO2 93.00%. Weight change: -3.259 kg (-7 lb 3 oz)   Alert and awake. No distress.  Cardiac reveals irregular rhythm due to PAC's and an S4 gallop.   ASSESSMENT:  1. PAF reverted spontaneously  2. Prior A. Flutter with ablation and recurrent LA flutter this admission (the initial rhythm).  3. Tachy-Brady syndrome  Plan:  1. Appreciate the suggestions of EF. Will start Amiodarone without loading and eventual aim at lowest dose possible to maintain NSR 2. No pacer for now due to co-morbidities. 3. After speaking with the patient, no coumadin for now, but if unable to maintain NSR, will  reconsider. 4. Needs f/u with me in 2 -3 weeks.  Selinda Eon 01/12/2012, 8:19 AM

## 2012-01-12 NOTE — Progress Notes (Signed)
Subjective: Patient without any complaints overnight. Cardiology input noted, past amiodarone, no plans for Coumadin. I will discuss case with Dr. Katrinka Blazing  Objective: Vital signs in last 24 hours: Temp:  [97.6 F (36.4 C)-98.7 F (37.1 C)] 98.7 F (37.1 C) (12/13 1053) Pulse Rate:  [56-83] 83  (12/13 1053) Resp:  [14-21] 19  (12/13 1053) BP: (82-155)/(31-79) 123/75 mmHg (12/13 1053) SpO2:  [93 %-97 %] 93 % (12/13 0648) Weight:  [95 kg (209 lb 7 oz)-98.8 kg (217 lb 13 oz)] 95 kg (209 lb 7 oz) (12/13 1053) Weight change: -3.259 kg (-7 lb 3 oz) Last BM Date: 01/11/12  Intake/Output from previous day: 12/12 0701 - 12/13 0700 In: 621.6 [I.V.:621.6] Out: -  Intake/Output this shift: Total I/O In: -  Out: 2966 [Other:2966]  General appearance: alert and cooperative Resp: clear to auscultation bilaterally Cardio: irregularly irregular rhythm Extremities: extremities normal, atraumatic, no cyanosis or edema  Lab Results:  Results for orders placed during the hospital encounter of 01/10/12 (from the past 24 hour(s))  HEPARIN LEVEL (UNFRACTIONATED)     Status: Abnormal   Collection Time   01/11/12  2:28 PM      Component Value Range   Heparin Unfractionated 0.28 (*) 0.30 - 0.70 IU/mL  GLUCOSE, CAPILLARY     Status: Abnormal   Collection Time   01/11/12  4:57 PM      Component Value Range   Glucose-Capillary 116 (*) 70 - 99 mg/dL  GLUCOSE, CAPILLARY     Status: Abnormal   Collection Time   01/11/12  7:56 PM      Component Value Range   Glucose-Capillary 244 (*) 70 - 99 mg/dL  CBC     Status: Abnormal   Collection Time   01/12/12  5:00 AM      Component Value Range   WBC 5.6  4.0 - 10.5 K/uL   RBC 3.16 (*) 4.22 - 5.81 MIL/uL   Hemoglobin 9.7 (*) 13.0 - 17.0 g/dL   HCT 40.9 (*) 81.1 - 91.4 %   MCV 91.1  78.0 - 100.0 fL   MCH 30.7  26.0 - 34.0 pg   MCHC 33.7  30.0 - 36.0 g/dL   RDW 78.2  95.6 - 21.3 %   Platelets 216  150 - 400 K/uL  RENAL FUNCTION PANEL     Status:  Abnormal   Collection Time   01/12/12  7:02 AM      Component Value Range   Sodium 133 (*) 135 - 145 mEq/L   Potassium 4.0  3.5 - 5.1 mEq/L   Chloride 92 (*) 96 - 112 mEq/L   CO2 25  19 - 32 mEq/L   Glucose, Bld 207 (*) 70 - 99 mg/dL   BUN 50 (*) 6 - 23 mg/dL   Creatinine, Ser 08.65 (*) 0.50 - 1.35 mg/dL   Calcium 8.9  8.4 - 78.4 mg/dL   Phosphorus 5.0 (*) 2.3 - 4.6 mg/dL   Albumin 3.4 (*) 3.5 - 5.2 g/dL   GFR calc non Af Amer 4 (*) >90 mL/min   GFR calc Af Amer 5 (*) >90 mL/min      Studies/Results: Dg Chest Port 1 View  01/10/2012  *RADIOLOGY REPORT*  Clinical Data: Tachycardia, cough, hypertension, diabetes, end- stage renal disease on dialysis  PORTABLE CHEST - 1 VIEW  Comparison: Portable exam 1713 hours compared to 01/04/2012  Findings: Upper normal-sized cardiac silhouette. Mediastinal contours and pulmonary vascularity normal. Minimal right basilar atelectasis and bronchitic changes. No  gross infiltrate, pleural effusion or pneumothorax. Bones unremarkable.  IMPRESSION: Minimal bronchitic changes and right basilar atelectasis.   Original Report Authenticated By: Ulyses Southward, M.D.     Medications:  Prior to Admission:  Prescriptions prior to admission  Medication Sig Dispense Refill  . aspirin EC 81 MG tablet Take 81 mg by mouth daily.      . benazepril (LOTENSIN) 40 MG tablet Take 40 mg by mouth 2 (two) times daily.      . Calcium Acetate 667 MG TABS Take 2 capsules by mouth 3 (three) times daily.      . cloNIDine (CATAPRES) 0.2 MG tablet Take 0.2 mg by mouth 2 (two) times daily.      Marland Kitchen glipiZIDE (GLUCOTROL) 10 MG tablet Take 10 mg by mouth 2 (two) times daily before a meal.      . hydrOXYzine (ATARAX/VISTARIL) 25 MG tablet Take 25 mg by mouth 2 (two) times daily.      Marland Kitchen latanoprost (XALATAN) 0.005 % ophthalmic solution Place 1 drop into the right eye at bedtime.      . multivitamin (RENA-VIT) TABS tablet Take 1 tablet by mouth daily.      . prednisoLONE acetate (PRED  MILD) 0.12 % ophthalmic suspension Place 1 drop into the left eye 4 (four) times daily.      . simvastatin (ZOCOR) 20 MG tablet Take 20 mg by mouth every evening.       Scheduled:   . amiodarone  200 mg Oral Daily  . aspirin EC  81 mg Oral Daily  . atorvastatin  10 mg Oral q1800  . benazepril  40 mg Oral BID  . calcium acetate  1,334 mg Oral TID WC  . cloNIDine  0.2 mg Oral BID  . darbepoetin (ARANESP) injection - DIALYSIS  40 mcg Intravenous Q Wed-HD  . enoxaparin (LOVENOX) injection  95 mg Subcutaneous Q24H  . glipiZIDE  5 mg Oral BID AC  . hydrOXYzine  25 mg Oral BID  . insulin aspart  0-9 Units Subcutaneous TID WC  . latanoprost  1 drop Right Eye QHS  . multivitamin  1 tablet Oral Daily  . paricalcitol  8 mcg Intravenous Q M,W,F-HD  . prednisoLONE acetate  1 drop Left Eye QID  . sodium chloride  3 mL Intravenous Q12H   Continuous:   Assessment/Plan: Paroxysmal atrial fibrillation, treatment per cardiology Hypertension controlled Diabetes End-stage renal disease Recent URI, clinically stable.  LOS: 2 days   Kenshin Splawn D 01/12/2012, 12:11 PM

## 2012-01-12 NOTE — Progress Notes (Signed)
  Echocardiogram 2D Echocardiogram has been performed.  [name] [name] [name] 01/12/2012, 4:18 PM

## 2012-01-13 LAB — GLUCOSE, CAPILLARY
Glucose-Capillary: 88 mg/dL (ref 70–99)
Glucose-Capillary: 102 mg/dL — ABNORMAL HIGH (ref 70–99)
Glucose-Capillary: 162 mg/dL — ABNORMAL HIGH (ref 70–99)

## 2012-01-13 MED ORDER — CLONIDINE HCL 0.1 MG PO TABS
0.1000 mg | ORAL_TABLET | Freq: Two times a day (BID) | ORAL | Status: DC
  Administered 2012-01-13 – 2012-01-14 (×3): 0.1 mg via ORAL
  Filled 2012-01-13 (×4): qty 1

## 2012-01-13 MED ORDER — DOCUSATE SODIUM 100 MG PO CAPS
100.0000 mg | ORAL_CAPSULE | Freq: Three times a day (TID) | ORAL | Status: DC
  Administered 2012-01-13 – 2012-01-14 (×3): 100 mg via ORAL
  Filled 2012-01-13 (×6): qty 1

## 2012-01-13 MED ORDER — PREDNISOLONE ACETATE 1 % OP SUSP
1.0000 [drp] | Freq: Every day | OPHTHALMIC | Status: DC
  Administered 2012-01-14: 1 [drp] via OPHTHALMIC
  Filled 2012-01-13: qty 1

## 2012-01-13 NOTE — Progress Notes (Signed)
Subjective: The patient has had no chest pain or dyspnea  Objective: Vital signs in last 24 hours: Temp:  [97.6 F (36.4 C)-98.7 F (37.1 C)] 97.7 F (36.5 C) (12/14 0500) Pulse Rate:  [63-84] 66  (12/14 0500) Resp:  [16-21] 16  (12/14 0500) BP: (108-153)/(60-79) 138/60 mmHg (12/14 0500) SpO2:  [97 %-100 %] 99 % (12/14 0500) Weight:  [95 kg (209 lb 7 oz)] 95 kg (209 lb 7 oz) (12/13 1053) Weight change: -3.8 kg (-8 lb 6 oz) Last BM Date: 01/12/12  Intake/Output from previous day: 12/13 0701 - 12/14 0700 In: 631.8 [P.O.:360; I.V.:271.8] Out: 2966  Intake/Output this shift:    Resp: clear to auscultation bilaterally Cardio: regular rate and rhythm, S1, S2 normal, no murmur, click, rub or gallop  Lab Results:  Basename 01/12/12 0500 01/11/12 0510  WBC 5.6 5.4  HGB 9.7* 10.6*  HCT 28.8* 31.1*  PLT 216 200   BMET  Basename 01/12/12 0702 01/11/12 0510  NA 133* 139  K 4.0 3.8  CL 92* 97  CO2 25 31  GLUCOSE 207* 160*  BUN 50* 35*  CREATININE 10.90* 8.46*  CALCIUM 8.9 9.0    Studies/Results: No results found.  Medications:  Prior to Admission:  Prescriptions prior to admission  Medication Sig Dispense Refill  . aspirin EC 81 MG tablet Take 81 mg by mouth daily.      . benazepril (LOTENSIN) 40 MG tablet Take 40 mg by mouth 2 (two) times daily.      . Calcium Acetate 667 MG TABS Take 2 capsules by mouth 3 (three) times daily.      . cloNIDine (CATAPRES) 0.2 MG tablet Take 0.2 mg by mouth 2 (two) times daily.      Marland Kitchen glipiZIDE (GLUCOTROL) 10 MG tablet Take 10 mg by mouth 2 (two) times daily before a meal.      . hydrOXYzine (ATARAX/VISTARIL) 25 MG tablet Take 25 mg by mouth 2 (two) times daily.      Marland Kitchen latanoprost (XALATAN) 0.005 % ophthalmic solution Place 1 drop into the right eye at bedtime.      . multivitamin (RENA-VIT) TABS tablet Take 1 tablet by mouth daily.      . prednisoLONE acetate (PRED MILD) 0.12 % ophthalmic suspension Place 1 drop into the left eye 4  (four) times daily.      . simvastatin (ZOCOR) 20 MG tablet Take 20 mg by mouth every evening.       Scheduled:   . amiodarone  200 mg Oral Daily  . aspirin EC  81 mg Oral Daily  . atorvastatin  10 mg Oral q1800  . benazepril  40 mg Oral BID  . calcium acetate  1,334 mg Oral TID WC  . cloNIDine  0.2 mg Oral BID  . darbepoetin (ARANESP) injection - DIALYSIS  40 mcg Intravenous Q Wed-HD  . enoxaparin (LOVENOX) injection  95 mg Subcutaneous Q24H  . glipiZIDE  5 mg Oral BID AC  . hydrOXYzine  25 mg Oral BID  . insulin aspart  0-9 Units Subcutaneous TID WC  . latanoprost  1 drop Right Eye QHS  . multivitamin  1 tablet Oral Daily  . paricalcitol  8 mcg Intravenous Q M,W,F-HD  . prednisoLONE acetate  1 drop Left Eye QID  . sodium chloride  3 mL Intravenous Q12H    Assessment/Plan: 1. Tachybrady syndrome, appreciate cardiology help will start weaning clonidine to avoid bradycardia 2. Htn watch bp on lower dose clonidine 3. Dm  good control cbg 88  LOS: 3 days   [name] Tigert [email or web address] 01/13/2012, 9:02 AM

## 2012-01-13 NOTE — Progress Notes (Signed)
Patient Name: [name] [name] Date of Encounter: 01/13/2012    SUBJECTIVE: Denies CP or dyspnea  TELEMETRY:  NSR with PAC's Filed Vitals:   01/12/12 1200 01/12/12 1400 01/12/12 2148 01/13/12 0500  BP: 153/75 108/66 113/72 138/60  Pulse: 80 75 84 66  Temp: 97.6 F (36.4 C) 97.7 F (36.5 C) 98.4 F (36.9 C) 97.7 F (36.5 C)  TempSrc: Axillary  Oral   Resp: 18 18 18 16   Height:      Weight:      SpO2: 97% 100% 98% 99%    Intake/Output Summary (Last 24 hours) at 01/13/12 0830 Last data filed at 01/12/12 1700  Gross per 24 hour  Intake 631.75 ml  Output   2966 ml  Net -2334.25 ml    LABS: Basic Metabolic Panel:  Basename 01/12/12 0702 01/11/12 0510 01/10/12 1658  NA 133* 139 --  K 4.0 3.8 --  CL 92* 97 --  CO2 25 31 --  GLUCOSE 207* 160* --  BUN 50* 35* --  CREATININE 10.90* 8.46* --  CALCIUM 8.9 9.0 --  MG -- -- 2.9*  PHOS 5.0* -- --   CBC:  Basename 01/12/12 0500 01/11/12 0510  WBC 5.6 5.4  NEUTROABS -- --  HGB 9.7* 10.6*  HCT 28.8* 31.1*  MCV 91.1 93.4  PLT 216 200   Hemoglobin A1C:  Basename 01/10/12 2106  HGBA1C 7.4*   Radiology/Studies:  CXR with no acute change.  Physical Exam: Blood pressure 138/60, pulse 66, temperature 97.7 F (36.5 C), temperature source Oral, resp. rate 16, height 6\' 3"  (1.905 m), weight 209 lb 7 oz (95 kg), SpO2 99.00%. Weight change: -8 lb 6 oz (-3.8 kg)   Alert and awake. No distress. Neck Supple Chest CTA Cardiac reveals irregular rhythm due to PAC's and an S4 gallop. Abd soft Ext no edema   ASSESSMENT:  1. PAF reverted spontaneously  2. Prior A. Flutter with ablation and recurrent LA flutter this admission (the initial rhythm).  3. Tachy-Brady syndrome  Plan:  1. Continue amiodarone without loading and eventual aim at lowest dose possible to maintain NSR 2. No pacer for now due to co-morbidities. 3. As outlined by Dr Katrinka Blazing no coumadin for now, but if unable to maintain NSR, will reconsider. Continue  ASA. 4. Given history of bradycardia and addition of amiodarone, would wean clonidine to off (change to 0.1 BID for 3 days, then 0.1 daily for 3 days and then off); watch BP and add non AV nodal blocking agent if BP increases. 5. Needs f/u with Dr Katrinka Blazing in 2 -3 weeks.  Jeanella Flattery 01/13/2012, 8:30 AM

## 2012-01-14 LAB — CBC
MCHC: 34.2 g/dL (ref 30.0–36.0)
RDW: 13.1 % (ref 11.5–15.5)

## 2012-01-14 MED ORDER — CLONIDINE HCL 0.2 MG PO TABS: 0.2000 mg | ORAL_TABLET | Freq: Two times a day (BID) | ORAL | Status: DC

## 2012-01-14 MED ORDER — AMIODARONE HCL 200 MG PO TABS: 200.0000 mg | ORAL_TABLET | Freq: Every day | ORAL | Status: DC

## 2012-01-14 NOTE — Discharge Summary (Signed)
Physician Discharge Summary   Patient ID:  [name] [name] [id number] 78 y.o. 1933-12-13  Admit date: 01/10/2012  Discharge date and time: No discharge date for patient encounter.   Admitting Physician: Clydia Llano, MD   Discharge Physician: Merlene Laughter, MD  Admission Diagnoses: Atrial fibrillation [427.31] Atrial flutter [427.32] Diabetes mellitus without complication [250.00] Hypertension [401.9] ESRD (end stage renal disease) [585.6] Peripheral neuropathy [356.9] Atrial flutter weakness  Discharge Diagnoses: atrial fibrillation Diabetes mellitus Hypertension End-stage renal disease Peripheral neuropathy   Admission Condition: fair  Discharged Condition: good  Indication for Admission: atrial fibrillation  Hospital Course:  Now the patient had been treated for an upper respiratory infection. The next day he was seen at dialysis and was noted. He denied any chest pain but did notice some congestion.in the hospital he was noted to be in atrial flutter/atrial fibrillation. It was decided he was not a good candidate for Coumadin. He was started on a Cardizem drip. Cardiology consult was obtained and he was placed on amiodarone. He has converted to normal sinus rhythm. During hospitalization he felt much better.of note hisCK total is 279 with an MB of 2.0 troponin was 0.07. Sodium 133 potassium 4.0 BUN 50 creatinine 10.9TSH was obtained with a value of 1.97. Cardiology suggested reducing his clonidine to avoid bradycardia and hypotension. Otherwise he will be discharged on the amiodarone and will followup with Dr. Katrinka Blazing and Dr.Polite  Consults: cardiology  Significant Diagnostic Studies: labs: as below Lab  01/10/12 1658   NA  141   K  3.5   CL  95*   CO2  34*   GLUCOSE  191*   BUN  25*   CREATININE  6.49*   CALCIUM  9.5   MG  2.9*   PHOS  --    Liver Function Tests: No results found for this basename: AST:5,ALT:5,ALKPHOS:5,BILITOT:5,PROT:5,ALBUMIN:5 in the last  168 hours No results found for this basename: LIPASE:5,AMYLASE:5 in the last 168 hours No results found for this basename: AMMONIA:5 in the last 168 hours CBC:  Lab  01/10/12 1658   WBC  5.3   NEUTROABS  --   HGB  12.5*   HCT  36.8*   MCV  91.1   PLT  237    Cardiac Enzymes: No results found for this basename: CKTOTAL:5,CKMB:5,CKMBINDEX:5,TROPONINI:5 in the last 168 hours  BNP (last 3 results)  Basename  01/10/12 1658   PROBNP  3616.0*    CBG: No results found for this basename: GLUCAP:5 in the last 168 hours  Radiological Exams on Admission: Dg Chest Port 1 View  01/10/2012  *RADIOLOGY REPORT*  Clinical Data: Tachycardia, cough, hypertension, diabetes, end- stage renal disease on dialysis  PORTABLE CHEST - 1 VIEW  Comparison: Portable exam 1713 hours compared to 01/04/2012  Findings: Upper normal-sized cardiac silhouette. Mediastinal contours and pulmonary vascularity normal. Minimal right basilar atelectasis and bronchitic changes. No gross infiltrate, pleural effusion or pneumothorax. Bones unremarkable.  IMPRESSION: Minimal bronchitic changes and right basilar atelectasis.   Original Report Authenticated By: Ulyses Southward, M.D.      Treatments: cardiac meds: diltiazem  Discharge Exam: heart regular rate and rhythm without murmur  Disposition: 01-Home or Self Care  Patient Instructions:    Medication List     As of 01/14/2012  9:58 AM    TAKE these medications         amiodarone 200 MG tablet   Commonly known as: PACERONE   Take 1 tablet (200 mg total) by mouth daily.  aspirin EC 81 MG tablet   Take 81 mg by mouth daily.      benazepril 40 MG tablet   Commonly known as: LOTENSIN   Take 40 mg by mouth 2 (two) times daily.      Calcium Acetate 667 MG Tabs   Take 2 capsules by mouth 3 (three) times daily.      cloNIDine 0.2 MG tablet   Commonly known as: CATAPRES   Take 1 tablet (0.2 mg total) by mouth 2 (two) times daily. Take 1/2 tablet twice daily       glipiZIDE 10 MG tablet   Commonly known as: GLUCOTROL   Take 10 mg by mouth 2 (two) times daily before a meal.      hydrOXYzine 25 MG tablet   Commonly known as: ATARAX/VISTARIL   Take 25 mg by mouth 2 (two) times daily.      latanoprost 0.005 % ophthalmic solution   Commonly known as: XALATAN   Place 1 drop into the right eye at bedtime.      multivitamin Tabs tablet   Take 1 tablet by mouth daily.      prednisoLONE acetate 1 % ophthalmic suspension   Commonly known as: PRED FORTE   Place 1 drop into the left eye daily.      simvastatin 20 MG tablet   Commonly known as: ZOCOR   Take 20 mg by mouth every evening.       Activity: activity as tolerated Diet: diabetic diet Wound Care: none needed  Follow-up with *Dr Verdis Prime in 2 weeks.  SignedGinette Otto 01/14/2012 9:58 AM

## 2012-01-14 NOTE — Progress Notes (Signed)
Patient remains in sinus on telemetry; plan as outlined previously; FU Dr Katrinka Blazing following DC. [name] [name] 8:57 AM

## 2012-01-14 NOTE — Progress Notes (Signed)
Patient ID: [name] [name], male   DOB: 11-15-1933, 78 y.o.   MRN: [id number]   [facility] Progress Note    Subjective:   Inquires about going home and states that he feels well.   Objective:   BP 109/66  Pulse 62  Temp 98.1 F (36.7 C) (Oral)  Resp 16  Ht 6\' 3"  (1.905 m)  Wt 95.301 kg (210 lb 1.6 oz)  BMI 26.26 kg/m2  SpO2 96%  Intake/Output Summary (Last 24 hours) at 01/14/12 0829 Last data filed at 01/14/12 0500  Gross per 24 hour  Intake   1320 ml  Output    100 ml  Net   1220 ml   Weight change: 0.301 kg (10.6 oz)  Physical Exam: YNW:GNFAOZHYQMV resting in bed HQI:ONGEX irregular, normal S1 and S2  Resp:Coarse BS bilaterally, no rales/rhonchi BMW:UXLK, flat, NT, BS normal Ext:No LE edema  Imaging: No results found.  Labs: BMET  Lab 01/12/12 0702 01/11/12 0510 01/10/12 1658  NA 133* 139 141  K 4.0 3.8 3.5  CL 92* 97 95*  CO2 25 31 34*  GLUCOSE 207* 160* 191*  BUN 50* 35* 25*  CREATININE 10.90* 8.46* 6.49*  ALB -- -- --  CALCIUM 8.9 9.0 9.5  PHOS 5.0* -- --   CBC  Lab 01/14/12 0525 01/12/12 0500 01/11/12 0510 01/10/12 1658  WBC 3.6* 5.6 5.4 5.3  NEUTROABS -- -- -- --  HGB 9.5* 9.7* 10.6* 12.5*  HCT 27.8* 28.8* 31.1* 36.8*  MCV 90.3 91.1 93.4 91.1  PLT 178 216 200 237    Medications:      . amiodarone  200 mg Oral Daily  . aspirin EC  81 mg Oral Daily  . atorvastatin  10 mg Oral q1800  . benazepril  40 mg Oral BID  . calcium acetate  1,334 mg Oral TID WC  . cloNIDine  0.1 mg Oral BID  . darbepoetin (ARANESP) injection - DIALYSIS  40 mcg Intravenous Q Wed-HD  . docusate sodium  100 mg Oral TID WC  . enoxaparin (LOVENOX) injection  95 mg Subcutaneous Q24H  . glipiZIDE  5 mg Oral BID AC  . hydrOXYzine  25 mg Oral BID  . insulin aspart  0-9 Units Subcutaneous TID WC  . latanoprost  1 drop Right Eye QHS  . multivitamin  1 tablet Oral Daily  . paricalcitol  8 mcg Intravenous Q M,W,F-HD  . prednisoLONE acetate  1 drop Left  Eye Daily  . sodium chloride  3 mL Intravenous Q12H     Assessment/ Plan:   1. Atrial Fibrillation/ Atypical Atrial Flutter/Tachy-brady syndrome: Cardiology directed switch to amiodarone from diltiazem gtt. Remains on anticoagulation with lovenox but no plans for coumadin. Not a candidate for nodal ablation/PPM due to comorbidities 2. ESRD - MWF HD ( [facility])= continue current schedule with dialysis tomorrow 3. Hypertension/volume - Weaning off clonidine due to bradycardia spells 4. Anemia - Hgb low- continue ESA. No overt losses noted 5. Metabolic bone disease - zemplar on hd and binders/ fu am renal panel pre hd       6.   DM type 2: per Dr.Polite   Zetta Bills, MD 01/14/2012, 8:29 AM

## 2012-11-18 ENCOUNTER — Other Ambulatory Visit: Payer: Self-pay | Admitting: *Deleted

## 2012-11-18 DIAGNOSIS — Z79899 Other long term (current) drug therapy: Secondary | ICD-10-CM

## 2012-12-11 ENCOUNTER — Encounter: Payer: Self-pay | Admitting: Interventional Cardiology

## 2012-12-12 ENCOUNTER — Ambulatory Visit (INDEPENDENT_AMBULATORY_CARE_PROVIDER_SITE_OTHER): Payer: Medicare Other | Admitting: Interventional Cardiology

## 2012-12-12 ENCOUNTER — Other Ambulatory Visit: Payer: Medicare Other

## 2012-12-12 ENCOUNTER — Encounter: Payer: Self-pay | Admitting: Interventional Cardiology

## 2012-12-12 VITALS — BP 140/80 | HR 67 | Ht 76.0 in | Wt 229.0 lb

## 2012-12-12 DIAGNOSIS — Z79899 Other long term (current) drug therapy: Secondary | ICD-10-CM

## 2012-12-12 DIAGNOSIS — I495 Sick sinus syndrome: Secondary | ICD-10-CM

## 2012-12-12 DIAGNOSIS — I10 Essential (primary) hypertension: Secondary | ICD-10-CM

## 2012-12-12 DIAGNOSIS — I4892 Unspecified atrial flutter: Secondary | ICD-10-CM

## 2012-12-12 DIAGNOSIS — N186 End stage renal disease: Secondary | ICD-10-CM

## 2012-12-12 DIAGNOSIS — I4891 Unspecified atrial fibrillation: Secondary | ICD-10-CM

## 2012-12-12 LAB — HEPATIC FUNCTION PANEL
AST: 17 U/L (ref 0–37)
Albumin: 4.2 g/dL (ref 3.5–5.2)

## 2012-12-12 LAB — TSH: TSH: 2.19 u[IU]/mL (ref 0.35–5.50)

## 2012-12-12 NOTE — Patient Instructions (Addendum)
Your physician wants you to follow-up in: 6 months with Dr. Katrinka Blazing we will repeat lab work on return TSH, LIVER You will receive a reminder letter in the mail two months in advance. If you don't receive a letter, please call our office to schedule the follow-up appointment.  Your physician recommends that you have lab work today: TSH, LIVER  Your physician recommends that you continue on your current medications as directed. Please refer to the Current Medication list given to you today.

## 2012-12-12 NOTE — Progress Notes (Signed)
Patient ID: [name] [name], male   DOB: 27-Sep-1933, 79 y.o.   MRN: [id number]    [address], [address] [place], [place]  [address] Phone: [phone] Fax:  [phone]  Date:  12/12/2012   ID:  [name] [name], DOB 30-Sep-1933, MRN [id number]  PCP:  Katy Apo, MD   ASSESSMENT:  1. Hypertension 2. Tachycardia-bradycardia syndrome 3. Atrial fibrillation/flutter. The patient's overall picture is consistent with a tachybradycardia syndrome 4. Amiodarone therapy 5. Chronic kidney disease, end-stage on chronic hemodialysis  PLAN:  1. TSH and hepatic panel today and repeat in 6 months on return 2. Consider reducing amiodarone dose to 100 mg per day on the next office visit 3. Clinical followup in 6 months.   SUBJECTIVE: [name] [name] is a 79 y.o. male who is on chronic dialysis. He's had no palpitations or cardiovascular complaints. His blood pressure is been under much better control since starting dialysis. He's had no recurrence of atrial arrhythmia on low-dose amiodarone   Wt Readings from Last 3 Encounters:  12/12/12 229 lb (103.874 kg)  01/14/12 210 lb 1.6 oz (95.301 kg)     Past Medical History  Diagnosis Date  . Renal disorder   . Diabetes mellitus without complication   . Atrial fibrillation   . Hypertension   . Glaucoma   . Peripheral neuropathy   . Gout   . Anemia     Current Outpatient Prescriptions  Medication Sig Dispense Refill  . amiodarone (PACERONE) 200 MG tablet Take 1 tablet (200 mg total) by mouth daily.  30 tablet  0  . aspirin EC 81 MG tablet Take 81 mg by mouth daily.      . benzonatate (TESSALON) 100 MG capsule Take by mouth 3 (three) times daily as needed for cough.      . Calcium Acetate 667 MG TABS Take 2 capsules by mouth 3 (three) times daily.      Marland Kitchen docusate sodium (COLACE) 100 MG capsule Take 100 mg by mouth 2 (two) times daily.      . folic acid-vitamin b complex-vitamin c-selenium-zinc (DIALYVITE) 3 MG TABS tablet Take 1  tablet by mouth daily.      Marland Kitchen glipiZIDE (GLUCOTROL) 10 MG tablet Take 10 mg by mouth 2 (two) times daily before a meal.      . latanoprost (XALATAN) 0.005 % ophthalmic solution Place 1 drop into the right eye at bedtime.      . prednisoLONE acetate (PRED FORTE) 1 % ophthalmic suspension Place 1 drop into the left eye daily.      . simvastatin (ZOCOR) 20 MG tablet Take 20 mg by mouth every evening.      . valsartan (DIOVAN) 160 MG tablet Take 160 mg by mouth daily.       No current facility-administered medications for this visit.    Allergies:   No Known Allergies  Social History:  The patient  reports that he has never smoked. He has never used smokeless tobacco. He reports that he does not drink alcohol or use illicit drugs.   ROS:  Please see the history of present illness.   All other systems reviewed and negative.   OBJECTIVE: VS:  Ht 6\' 4"  (1.93 m)  Wt 229 lb (103.874 kg)  BMI 27.89 kg/m2 Well nourished, well developed, in no acute distress, elderly HEENT: normal Neck: JVD flat. Carotid bruit absent  Cardiac:  normal S1, S2; RRR; no murmur Lungs:  clear to auscultation bilaterally, no wheezing,  rhonchi or rales Abd: soft, nontender, no hepatomegaly Ext: Edema absent. Pulses 1-2+ Skin: warm and dry Neuro:  CNs 2-12 intact, no focal abnormalities noted  EKG:  Sinus bradycardia, prominent voltage       Signed, Darci Needle III, MD 12/12/2012 11:02 AM  \

## 2012-12-16 ENCOUNTER — Telehealth: Payer: Self-pay

## 2012-12-16 NOTE — Telephone Encounter (Signed)
pt given lab results.No evidence of thyroid or liver toxicity from amiodarone. The same labs need to be repeated in 6 monthswith his o/v.pt verbalized understanding.

## 2012-12-16 NOTE — Telephone Encounter (Signed)
Message copied by Jarvis Newcomer on Mon Dec 16, 2012 12:23 PM ------      Message from: Verdis Prime      Created: Mon Dec 16, 2012  8:41 AM       No evidence of thyroid or liver toxicity from amiodarone. The same labs need to be repeated in 6 months when he comes for followup ------

## 2012-12-20 ENCOUNTER — Ambulatory Visit: Payer: Medicare Other | Admitting: Internal Medicine

## 2012-12-20 ENCOUNTER — Ambulatory Visit: Payer: Medicare Other | Admitting: Nurse Practitioner

## 2013-06-24 ENCOUNTER — Ambulatory Visit: Payer: Medicare Other | Admitting: Interventional Cardiology

## 2013-08-21 ENCOUNTER — Ambulatory Visit: Payer: Medicare Other | Admitting: Interventional Cardiology

## 2013-11-03 ENCOUNTER — Ambulatory Visit: Payer: Medicare Other | Admitting: Interventional Cardiology

## 2013-11-05 ENCOUNTER — Emergency Department (HOSPITAL_COMMUNITY): Payer: Medicare Other

## 2013-11-05 ENCOUNTER — Encounter (HOSPITAL_COMMUNITY): Payer: Self-pay | Admitting: Emergency Medicine

## 2013-11-05 ENCOUNTER — Inpatient Hospital Stay (HOSPITAL_COMMUNITY)
Admission: EM | Admit: 2013-11-05 | Discharge: 2013-11-06 | DRG: 308 | Discharge status: Against Medical Advice | Payer: Medicare Other | Attending: Cardiology | Admitting: Cardiology

## 2013-11-05 DIAGNOSIS — Z992 Dependence on renal dialysis: Secondary | ICD-10-CM

## 2013-11-05 DIAGNOSIS — I471 Supraventricular tachycardia: Secondary | ICD-10-CM | POA: Diagnosis not present

## 2013-11-05 DIAGNOSIS — Z79899 Other long term (current) drug therapy: Secondary | ICD-10-CM

## 2013-11-05 DIAGNOSIS — I495 Sick sinus syndrome: Secondary | ICD-10-CM | POA: Diagnosis present

## 2013-11-05 DIAGNOSIS — I4891 Unspecified atrial fibrillation: Secondary | ICD-10-CM | POA: Diagnosis not present

## 2013-11-05 DIAGNOSIS — Z7982 Long term (current) use of aspirin: Secondary | ICD-10-CM

## 2013-11-05 DIAGNOSIS — I4892 Unspecified atrial flutter: Secondary | ICD-10-CM | POA: Diagnosis present

## 2013-11-05 DIAGNOSIS — I5032 Chronic diastolic (congestive) heart failure: Secondary | ICD-10-CM | POA: Diagnosis present

## 2013-11-05 DIAGNOSIS — I120 Hypertensive chronic kidney disease with stage 5 chronic kidney disease or end stage renal disease: Secondary | ICD-10-CM | POA: Diagnosis present

## 2013-11-05 DIAGNOSIS — E1140 Type 2 diabetes mellitus with diabetic neuropathy, unspecified: Secondary | ICD-10-CM | POA: Diagnosis present

## 2013-11-05 DIAGNOSIS — N186 End stage renal disease: Secondary | ICD-10-CM | POA: Diagnosis present

## 2013-11-05 DIAGNOSIS — H409 Unspecified glaucoma: Secondary | ICD-10-CM | POA: Diagnosis present

## 2013-11-05 DIAGNOSIS — I484 Atypical atrial flutter: Secondary | ICD-10-CM

## 2013-11-05 DIAGNOSIS — R000 Tachycardia, unspecified: Secondary | ICD-10-CM

## 2013-11-05 DIAGNOSIS — N189 Chronic kidney disease, unspecified: Secondary | ICD-10-CM

## 2013-11-05 LAB — CBC WITH DIFFERENTIAL/PLATELET
BASOS PCT: 1 % (ref 0–1)
Basophils Absolute: 0 10*3/uL (ref 0.0–0.1)
EOS ABS: 0.1 10*3/uL (ref 0.0–0.7)
EOS PCT: 1 % (ref 0–5)
HCT: 36.4 % — ABNORMAL LOW (ref 39.0–52.0)
Hemoglobin: 12.2 g/dL — ABNORMAL LOW (ref 13.0–17.0)
LYMPHS ABS: 1.4 10*3/uL (ref 0.7–4.0)
Lymphocytes Relative: 32 % (ref 12–46)
MCH: 31.1 pg (ref 26.0–34.0)
MCHC: 33.5 g/dL (ref 30.0–36.0)
MCV: 92.9 fL (ref 78.0–100.0)
MONOS PCT: 10 % (ref 3–12)
Monocytes Absolute: 0.5 10*3/uL (ref 0.1–1.0)
NEUTROS PCT: 56 % (ref 43–77)
Neutro Abs: 2.6 10*3/uL (ref 1.7–7.7)
PLATELETS: 183 10*3/uL (ref 150–400)
RBC: 3.92 MIL/uL — ABNORMAL LOW (ref 4.22–5.81)
RDW: 13.5 % (ref 11.5–15.5)
WBC: 4.5 10*3/uL (ref 4.0–10.5)

## 2013-11-05 LAB — BASIC METABOLIC PANEL
Anion gap: 14 (ref 5–15)
BUN: 28 mg/dL — AB (ref 6–23)
CALCIUM: 8.9 mg/dL (ref 8.4–10.5)
CO2: 31 mEq/L (ref 19–32)
Chloride: 92 mEq/L — ABNORMAL LOW (ref 96–112)
Creatinine, Ser: 7.26 mg/dL — ABNORMAL HIGH (ref 0.50–1.35)
GFR, EST AFRICAN AMERICAN: 7 mL/min — AB (ref >90)
GFR, EST NON AFRICAN AMERICAN: 6 mL/min — AB (ref >90)
GLUCOSE: 224 mg/dL — AB (ref 70–99)
POTASSIUM: 4 meq/L (ref 3.7–5.3)
SODIUM: 137 meq/L (ref 137–147)

## 2013-11-05 LAB — CBG MONITORING, ED: GLUCOSE-CAPILLARY: 74 mg/dL (ref 70–99)

## 2013-11-05 LAB — PRO B NATRIURETIC PEPTIDE: Pro B Natriuretic peptide (BNP): 8040 pg/mL — ABNORMAL HIGH (ref 0–450)

## 2013-11-05 LAB — I-STAT TROPONIN, ED: Troponin i, poc: 0.02 ng/mL (ref 0.00–0.08)

## 2013-11-05 MED ORDER — ADENOSINE 6 MG/2ML IV SOLN
6.0000 mg | Freq: Once | INTRAVENOUS | Status: AC
  Administered 2013-11-05: 6 mg via INTRAVENOUS
  Filled 2013-11-05: qty 2

## 2013-11-05 MED ORDER — DILTIAZEM LOAD VIA INFUSION: 10.0000 mg | Freq: Once | INTRAVENOUS | Status: DC

## 2013-11-05 MED ORDER — SODIUM CHLORIDE 0.9 % IV BOLUS (SEPSIS)
500.0000 mL | Freq: Once | INTRAVENOUS | Status: AC
  Administered 2013-11-05: 500 mL via INTRAVENOUS

## 2013-11-05 MED ORDER — DILTIAZEM HCL 100 MG IV SOLR
5.0000 mg/h | Freq: Once | INTRAVENOUS | Status: AC
  Administered 2013-11-05: 5 mg/h via INTRAVENOUS

## 2013-11-05 MED ORDER — DILTIAZEM HCL 100 MG IV SOLR
10.0000 mg/h | Freq: Once | INTRAVENOUS | Status: AC
  Administered 2013-11-05: 10 mg/h via INTRAVENOUS

## 2013-11-05 MED ORDER — DILTIAZEM HCL 25 MG/5ML IV SOLN
10.0000 mg | Freq: Once | INTRAVENOUS | Status: AC
Start: 2013-11-05 — End: 2013-11-05
  Administered 2013-11-05: 10 mg via INTRAVENOUS
  Filled 2013-11-05: qty 5

## 2013-11-05 MED ORDER — DILTIAZEM HCL 25 MG/5ML IV SOLN
10.0000 mg | Freq: Once | INTRAVENOUS | Status: AC
  Administered 2013-11-05: 10 mg via INTRAVENOUS
  Filled 2013-11-05: qty 5

## 2013-11-05 MED ORDER — AMIODARONE IV BOLUS ONLY 150 MG/100ML
150.0000 mg | Freq: Once | INTRAVENOUS | Status: AC
  Administered 2013-11-05: 150 mg via INTRAVENOUS
  Filled 2013-11-05: qty 100

## 2013-11-05 NOTE — ED Notes (Signed)
Parker, PA at bedside.  

## 2013-11-05 NOTE — ED Notes (Signed)
Pt reports heart rate was increased at dialysis on Monday. Today at dialysis heart rate was still increased. Pt sts he has had cp as well. Dialysis fistula L upper arm

## 2013-11-05 NOTE — ED Notes (Signed)
Pt reports receiving HD M,W,F, rcvd 3.25 hrs of HD today & then started having increased HR

## 2013-11-05 NOTE — ED Provider Notes (Signed)
CSN: [id number]     Arrival date & time 11/05/13  1653 History   First MD Initiated Contact with Patient 11/05/13 1716     Chief Complaint  Patient presents with  . Tachycardia     (Consider location/radiation/quality/duration/timing/severity/associated sxs/prior Treatment) HPI Comments: The patient is a 80 year old male past history of diabetes, HTN, CKD. on hemodialysis, anemia presenting to emergency room chief complaint of fast heart rate since Monday. The patient reports after a full run of dialysis on Monday developed a fast heart rate, reports he thought would resolve with medication at home. Associated palpitations Monday, denies palpitations today. However patient was persistently tachycardic at dialysis today. Patient had a brother full run of dialysis today. He reports chest pain today described as substernal. The patient denies lightheadedness, syncope. PCP: Katy Apo, MD   The history is provided by the patient. No language interpreter was used.    Past Medical History  Diagnosis Date  . Renal disorder   . Diabetes mellitus without complication   . Atrial fibrillation   . Hypertension   . Glaucoma   . Peripheral neuropathy   . Gout   . Anemia    Past Surgical History  Procedure Laterality Date  . Ablation of dysrhythmic focus     No family history on file. History  Substance Use Topics  . Smoking status: Never Smoker   . Smokeless tobacco: Never Used  . Alcohol Use: No    Review of Systems  Constitutional: Negative for fever and chills.  Respiratory: Negative for shortness of breath.   Cardiovascular: Positive for chest pain and palpitations. Negative for leg swelling.  Gastrointestinal: Negative for nausea, abdominal pain, diarrhea and constipation.      Allergies  Review of patient's allergies indicates no known allergies.  Home Medications   Prior to Admission medications   Medication Sig Start Date End Date Taking? Authorizing Provider   amiodarone (PACERONE) 200 MG tablet Take 1 tablet (200 mg total) by mouth daily. 01/14/12   Hal Stoneking, MD  aspirin EC 81 MG tablet Take 81 mg by mouth daily.    Historical Provider, MD  benzonatate (TESSALON) 100 MG capsule Take by mouth 3 (three) times daily as needed for cough.    Historical Provider, MD  Calcium Acetate 667 MG TABS Take 2 capsules by mouth 3 (three) times daily.    Historical Provider, MD  docusate sodium (COLACE) 100 MG capsule Take 100 mg by mouth 2 (two) times daily.    Historical Provider, MD  folic acid-vitamin b complex-vitamin c-selenium-zinc (DIALYVITE) 3 MG TABS tablet Take 1 tablet by mouth daily.    Historical Provider, MD  glipiZIDE (GLUCOTROL) 10 MG tablet Take 10 mg by mouth 2 (two) times daily before a meal.    Historical Provider, MD  latanoprost (XALATAN) 0.005 % ophthalmic solution Place 1 drop into the right eye at bedtime.    Historical Provider, MD  prednisoLONE acetate (PRED FORTE) 1 % ophthalmic suspension Place 1 drop into the left eye daily.    Historical Provider, MD  simvastatin (ZOCOR) 20 MG tablet Take 20 mg by mouth every evening.    Historical Provider, MD  valsartan (DIOVAN) 160 MG tablet Take 160 mg by mouth daily.    Historical Provider, MD   BP 120/88  Pulse 149  Temp(Src) 98.1 F (36.7 C) (Oral)  Resp 18  Ht 6\' 4"  (1.93 m)  Wt 225 lb (102.059 kg)  BMI 27.40 kg/m2  SpO2 97% Physical Exam  Nursing  note and vitals reviewed. Constitutional: He is oriented to person, place, and time. He appears well-developed and well-nourished. No distress.  Neck: Neck supple.  Cardiovascular: Regular rhythm.  Tachycardia present.   Pulmonary/Chest: Effort normal and breath sounds normal. Not tachypneic. No respiratory distress. He has no decreased breath sounds. He has no wheezes. He has no rhonchi.  Patient is able to speak in complete sentences.   Abdominal: Soft. Bowel sounds are normal. There is no tenderness. There is no rebound.   Neurological: He is alert and oriented to person, place, and time.  Skin: Skin is warm and dry. He is not diaphoretic.  Psychiatric: He has a normal mood and affect. His behavior is normal.    ED Course  Procedures (including critical care time) CRITICAL CARE Performed by: Jakaila Norment M   TotalClabe Seal critical care time: 45  Critical care time was exclusive of separately billable procedures and treating other patients.  Critical care was necessary to treat or prevent imminent or life-threatening deterioration.  Critical care was time spent personally by me on the following activities: development of treatment plan with patient and/or surrogate as well as nursing, discussions with consultants, evaluation of patient's response to treatment, examination of patient, obtaining history from patient or surrogate, ordering and performing treatments and interventions, ordering and review of laboratory studies, ordering and review of radiographic studies, pulse oximetry and re-evaluation of patient's condition.  Labs Review Labs Reviewed  CBC WITH DIFFERENTIAL - Abnormal; Notable for the following:    RBC 3.92 (*)    Hemoglobin 12.2 (*)    HCT 36.4 (*)    All other components within normal limits  BASIC METABOLIC PANEL - Abnormal; Notable for the following:    Chloride 92 (*)    Glucose, Bld 224 (*)    BUN 28 (*)    Creatinine, Ser 7.26 (*)    GFR calc non Af Amer 6 (*)    GFR calc Af Amer 7 (*)    All other components within normal limits  PRO B NATRIURETIC PEPTIDE - Abnormal; Notable for the following:    Pro B Natriuretic peptide (BNP) 8040.0 (*)    All other components within normal limits  I-STAT TROPOININ, ED  CBG MONITORING, ED    Imaging Review Dg Chest 2 View  11/05/2013   CLINICAL DATA:  Initial encounter for tachycardia beginning 3 days ago during dialysis.  EXAM: CHEST  2 VIEW  COMPARISON:  One-view chest 01/10/2012.  FINDINGS: The heart size is normal. Mild pulmonary  vascular congestion has increased. No focal airspace disease is evident. The visualized soft tissues and bony thorax are unremarkable.  IMPRESSION: 1. Increased pulmonary vascular congestion. 2. Chronic mild interstitial coarsening.   Electronically Signed   By: Gennette Pachris  Mattern M.D.   On: 11/05/2013 19:02     EKG Interpretation   Date/Time:  Wednesday November 05 2013 17:08:55 EDT Ventricular Rate:  151 PR Interval:    QRS Duration: 84 QT Interval:  308 QTC Calculation: 488 R Axis:   54 Text Interpretation:  Supraventricular tachycardia vs atrail flutter  Cannot rule out Anterior infarct , age undetermined Abnormal ECG Confirmed  by DOCHERTY  MD, MEGAN [phone]) on 11/05/2013 5:19:59 PM      MDM   Final diagnoses:  Tachycardia  CKD (chronic kidney disease), unspecified stage   Patient presented with gradual SVT versus a flutter, similar to previous EKGs in December 2013 patient is mentating appropriately. Unable to decrease her rate with carotid massage, vagal  maneuver. Cardizem bolus ordered. Re-eval Pt HR 129 NS bolus ordered. Re-eval pt HR 130 BP 123/80 Patient given 2 rounds adenosine 6 mg, Dr. Micheline Maze was also in the room during this intervention. The patient converted to normal sinus rhythm for less than 10 seconds, and then his heart rate proceeded to go into the 120s. Pt tolerated medications without complaints. 8:53 PM Discussed with Cardiology, after reviewing the pateint's EKG, he advises amiodarone and repeat bolus of Cardizem for possible a-flutter vs. SVT. 2120 Re-eval pt 120-112, discussed with Dr. Micheline Maze who agrees re-consulting cardiology. 2145 Re-eval pt persistently tachycardic 120-112, discussed with cardiology who agrees to see the patient. 12:50 AM Cardiology to admit the patient. 1:07 AM Pt requesting to be discharged home "to die", states "has to die sometime" agrees to stay for treatment, declines anticoagulation, or further IV's. Meds given in  ED:  Medications  diltiazem (CARDIZEM) injection 10 mg (0 mg Intravenous Stopped 11/05/13 1756)  diltiazem (CARDIZEM) 100 mg in dextrose 5 % 100 mL (1 mg/mL) infusion (0 mg/hr Intravenous Stopped 11/05/13 2131)  sodium chloride 0.9 % bolus 500 mL (0 mLs Intravenous Stopped 11/05/13 2029)  adenosine (ADENOCARD) 6 MG/2ML injection 6 mg (6 mg Intravenous Given 11/05/13 2115)  adenosine (ADENOCARD) 6 MG/2ML injection 6 mg (6 mg Intravenous Given 11/05/13 2141)  amiodarone (NEXTERONE) IV bolus only 150 mg/100 mL (0 mg Intravenous Stopped 11/05/13 2326)  diltiazem (CARDIZEM) injection 10 mg (0 mg Intravenous Stopped 11/05/13 2211)    New Prescriptions   No medications on file      Mellody Drown, PA-C 11/10/13 1504

## 2013-11-05 NOTE — ED Notes (Signed)
Patient transported to X-ray 

## 2013-11-06 ENCOUNTER — Ambulatory Visit: Payer: Medicare Other | Admitting: Interventional Cardiology

## 2013-11-06 DIAGNOSIS — I4891 Unspecified atrial fibrillation: Secondary | ICD-10-CM | POA: Diagnosis present

## 2013-11-06 DIAGNOSIS — H409 Unspecified glaucoma: Secondary | ICD-10-CM | POA: Diagnosis present

## 2013-11-06 DIAGNOSIS — I4892 Unspecified atrial flutter: Secondary | ICD-10-CM | POA: Diagnosis present

## 2013-11-06 DIAGNOSIS — Z7982 Long term (current) use of aspirin: Secondary | ICD-10-CM | POA: Diagnosis not present

## 2013-11-06 DIAGNOSIS — I484 Atypical atrial flutter: Secondary | ICD-10-CM

## 2013-11-06 DIAGNOSIS — E1140 Type 2 diabetes mellitus with diabetic neuropathy, unspecified: Secondary | ICD-10-CM | POA: Diagnosis present

## 2013-11-06 DIAGNOSIS — N186 End stage renal disease: Secondary | ICD-10-CM | POA: Diagnosis present

## 2013-11-06 DIAGNOSIS — Z79899 Other long term (current) drug therapy: Secondary | ICD-10-CM | POA: Diagnosis not present

## 2013-11-06 DIAGNOSIS — I5032 Chronic diastolic (congestive) heart failure: Secondary | ICD-10-CM | POA: Diagnosis present

## 2013-11-06 DIAGNOSIS — I471 Supraventricular tachycardia: Secondary | ICD-10-CM | POA: Diagnosis present

## 2013-11-06 DIAGNOSIS — Z992 Dependence on renal dialysis: Secondary | ICD-10-CM | POA: Diagnosis not present

## 2013-11-06 DIAGNOSIS — I120 Hypertensive chronic kidney disease with stage 5 chronic kidney disease or end stage renal disease: Secondary | ICD-10-CM | POA: Diagnosis present

## 2013-11-06 DIAGNOSIS — I495 Sick sinus syndrome: Secondary | ICD-10-CM

## 2013-11-06 LAB — GLUCOSE, CAPILLARY
GLUCOSE-CAPILLARY: 154 mg/dL — AB (ref 70–99)
Glucose-Capillary: 122 mg/dL — ABNORMAL HIGH (ref 70–99)
Glucose-Capillary: 171 mg/dL — ABNORMAL HIGH (ref 70–99)

## 2013-11-06 MED ORDER — PREDNISOLONE ACETATE 1 % OP SUSP
1.0000 [drp] | Freq: Every day | OPHTHALMIC | Status: DC
  Filled 2013-11-06: qty 1

## 2013-11-06 MED ORDER — SODIUM CHLORIDE 0.9 % IV SOLN: 250.0000 mL | INTRAVENOUS | Status: DC | PRN

## 2013-11-06 MED ORDER — INSULIN ASPART 100 UNIT/ML ~~LOC~~ SOLN: 0.0000 [IU] | Freq: Every day | SUBCUTANEOUS | Status: DC

## 2013-11-06 MED ORDER — CALCIUM ACETATE 667 MG PO CAPS
1334.0000 mg | ORAL_CAPSULE | Freq: Three times a day (TID) | ORAL | Status: DC
Start: 2013-11-06 — End: 2013-11-06
  Filled 2013-11-06 (×4): qty 2

## 2013-11-06 MED ORDER — NITROGLYCERIN 0.4 MG SL SUBL: 0.4000 mg | SUBLINGUAL_TABLET | SUBLINGUAL | Status: DC | PRN

## 2013-11-06 MED ORDER — INSULIN ASPART 100 UNIT/ML ~~LOC~~ SOLN
0.0000 [IU] | Freq: Three times a day (TID) | SUBCUTANEOUS | Status: DC
  Administered 2013-11-06: 3 [IU] via SUBCUTANEOUS

## 2013-11-06 MED ORDER — ONDANSETRON HCL 4 MG/2ML IJ SOLN: 4.0000 mg | Freq: Four times a day (QID) | INTRAMUSCULAR | Status: DC | PRN

## 2013-11-06 MED ORDER — SODIUM CHLORIDE 0.9 % IJ SOLN: 3.0000 mL | Freq: Two times a day (BID) | INTRAMUSCULAR | Status: DC

## 2013-11-06 MED ORDER — SIMVASTATIN 20 MG PO TABS
20.0000 mg | ORAL_TABLET | Freq: Every evening | ORAL | Status: DC
  Filled 2013-11-06: qty 1

## 2013-11-06 MED ORDER — WARFARIN SODIUM 5 MG PO TABS
5.0000 mg | ORAL_TABLET | ORAL | Status: DC
  Filled 2013-11-06: qty 1

## 2013-11-06 MED ORDER — IRBESARTAN 150 MG PO TABS
150.0000 mg | ORAL_TABLET | Freq: Every day | ORAL | Status: DC
  Filled 2013-11-06: qty 1

## 2013-11-06 MED ORDER — WARFARIN - PHARMACIST DOSING INPATIENT: Freq: Every day | Status: DC

## 2013-11-06 MED ORDER — ASPIRIN EC 81 MG PO TBEC: 81.0000 mg | DELAYED_RELEASE_TABLET | Freq: Every day | ORAL | Status: DC

## 2013-11-06 MED ORDER — DILTIAZEM HCL ER COATED BEADS 120 MG PO CP24: 120.0000 mg | ORAL_CAPSULE | Freq: Every day | ORAL | Status: DC

## 2013-11-06 MED ORDER — DORZOLAMIDE HCL-TIMOLOL MAL 2-0.5 % OP SOLN
2.0000 [drp] | Freq: Two times a day (BID) | OPHTHALMIC | Status: DC
  Filled 2013-11-06: qty 10

## 2013-11-06 MED ORDER — DILTIAZEM HCL 100 MG IV SOLR
5.0000 mg/h | INTRAVENOUS | Status: DC
  Filled 2013-11-06: qty 100

## 2013-11-06 MED ORDER — AMIODARONE HCL IN DEXTROSE 360-4.14 MG/200ML-% IV SOLN: 30.0000 mg/h | Freq: Once | INTRAVENOUS | Status: DC

## 2013-11-06 MED ORDER — DOCUSATE SODIUM 100 MG PO CAPS: 100.0000 mg | ORAL_CAPSULE | Freq: Two times a day (BID) | ORAL | Status: DC

## 2013-11-06 MED ORDER — BENZONATATE 100 MG PO CAPS
100.0000 mg | ORAL_CAPSULE | Freq: Three times a day (TID) | ORAL | Status: DC | PRN
  Filled 2013-11-06: qty 1

## 2013-11-06 MED ORDER — DILTIAZEM HCL 100 MG IV SOLR
10.0000 mg/h | INTRAVENOUS | Status: DC
  Filled 2013-11-06: qty 100

## 2013-11-06 MED ORDER — HEPARIN (PORCINE) IN NACL 100-0.45 UNIT/ML-% IJ SOLN
1200.0000 [IU]/h | INTRAMUSCULAR | Status: DC
  Filled 2013-11-06: qty 250

## 2013-11-06 MED ORDER — LATANOPROST 0.005 % OP SOLN
1.0000 [drp] | Freq: Every day | OPHTHALMIC | Status: DC
  Filled 2013-11-06: qty 2.5

## 2013-11-06 MED ORDER — ACETAMINOPHEN 325 MG PO TABS: 650.0000 mg | ORAL_TABLET | ORAL | Status: DC | PRN

## 2013-11-06 MED ORDER — HEPARIN BOLUS VIA INFUSION
3000.0000 [IU] | Freq: Once | INTRAVENOUS | Status: DC
  Filled 2013-11-06: qty 3000

## 2013-11-06 MED ORDER — SODIUM CHLORIDE 0.9 % IJ SOLN: 3.0000 mL | INTRAMUSCULAR | Status: DC | PRN

## 2013-11-06 NOTE — Progress Notes (Addendum)
ANTICOAGULATION CONSULT NOTE - Initial Consult  Pharmacy Consult for heparin and coumadin  Indication: atrial fibrillation/flutter and SVT   No Known Allergies  Patient Measurements: Height: 6\' 4"  (193 cm) Weight: 225 lb (102.059 kg) IBW/kg (Calculated) : 86.8 Heparin Dosing Weight:   Vital Signs: Temp: 98.5 F (36.9 C) (10/07 2325) Temp Source: Oral (10/07 2325) BP: 141/103 mmHg (10/08 0030) Pulse Rate: 123 (10/08 0030)  Labs:  Recent Labs  11/05/13 1717  HGB 12.2*  HCT 36.4*  PLT 183  CREATININE 7.26*    Estimated Creatinine Clearance: 10.3 ml/min (by C-G formula based on Cr of 7.26).   Medical History: Past Medical History  Diagnosis Date  . Renal disorder   . Diabetes mellitus without complication   . Atrial fibrillation   . Hypertension   . Glaucoma   . Peripheral neuropathy   . Gout   . Anemia     Medications:   No coumadin or NOAC's listed. Remote hx of coumadin.   Assessment: 80 yo male with hx ESRD (mwf), afib/flutter and tachy-brady synd. Presented to ER with SVT. Now with atrial arrythmias. Coumadin and heparin for anticoagulation.   Goal of Therapy:  INR 2-3 Monitor platelets by anticoagulation protocol: Yes   Plan:  Heparin bolus 3000 unitsx1 then 1200 units/hr.  HL in 8 hours.  Daily HL, cbc and INR to begin with am labs daily.  Give coumadin 5mg  x1 tonight  Janice Coffinarl, Tyleah Loh Jonathan 11/06/2013,1:02 AM  1:16 AM   Pt now refusing all anticoags. Agrees to fluids and observation overnight.   Janice CoffinEarl, Velecia Ovitt Jonathan

## 2013-11-06 NOTE — Progress Notes (Signed)
UR complete.  Kerby Hockley RN, MSN 

## 2013-11-06 NOTE — Progress Notes (Signed)
    Subjective:  The patient has no complaints this morning. He is demanding to leave the hospital. He does not want any treatment. Objective:  Vital Signs in the last 24 hours: Temp:  [97.9 F (36.6 C)-98.6 F (37 C)] 97.9 F (36.6 C) (10/08 0528) Pulse Rate:  [118-151] 123 (10/08 0528) Resp:  [13-26] 18 (10/08 0528) BP: (102-170)/(67-125) 124/92 mmHg (10/08 0528) SpO2:  [97 %-100 %] 100 % (10/08 0528) Weight:  [225 lb (102.059 kg)-228 lb 6.3 oz (103.6 kg)] 228 lb 6.3 oz (103.6 kg) (10/08 0224)  Intake/Output from previous day:    Physical Exam: Pt is alert and oriented, NAD No respiratory distress  Lab Results:  Recent Labs  11/05/13 1717  WBC 4.5  HGB 12.2*  PLT 183    Recent Labs  11/05/13 1717  NA 137  K 4.0  CL 92*  CO2 31  GLUCOSE 224*  BUN 28*  CREATININE 7.26*   No results found for this basename: TROPONINI, CK, MB,  in the last 72 hours  Tele: Atrial flutter ventricular rate 120 bpm  Assessment/Plan:  1. Atrial flutter with RVR 2. End-stage renal disease, last hemodialysis yesterday  I have had a lengthy discussion with the patient about treatment options. We discussed the use of IV amiodarone, IV diltiazem for rate control, cardioversion, and EP evaluation. He is not interested in pursuing any treatment whatsoever. He states that he absolutely will not take any anticoagulant drugs. He took Coumadin in the past and he refuses to come in for INR checks. He wants to leave the hospital AGAINST MEDICAL ADVICE. He was sent here yesterday from outpatient dialysis and states that he would not have come in otherwise. He currently does not have any symptoms. I informed him that we would be happy to take care of him in the future if he has problems related to his heart. He states that he will not be returning to the hospital.Will continue amiodarone and add oral diltiazem to try to help with rate control.  Tonny BollmanMichael Micha Dosanjh, M.D. 11/06/2013, 11:30 AM

## 2013-11-06 NOTE — Progress Notes (Addendum)
Pt left AMA. Signed note in chart. Pt [facility] [place]. Pt wife picked him up and transported via taxi. MD aware. Thomas HoffBurton, Jasmyn Picha McClintock, RN

## 2013-11-06 NOTE — ED Notes (Signed)
Report attempted, RN to call back. 

## 2013-11-06 NOTE — H&P (Addendum)
Physician History and Physical    [name] [name] MRN: [id number] DOB/AGE: [birth year] 78 y.o. Admit date: 11/05/2013  Primary Care Physician: Dr. Nehemiah Settle Primary Cardiologist: Dr. Katrinka Blazing  HPI: 80 yo with history of ESRD, atrial fibrillation/flutter and tachy-brady syndrome (no pacemaker) presented to the ER with SVT.  Patient had atrial flutter ablation in 2003.  He was hospitalized with atrial fibrillation/flutter in 12/13.  He was not started on coumadin.  He converted to NSR on amiodarone, which was continued.  He had not had recognized recurrent atrial arrhythmias until this week.  He says that on Monday, his HR was high at HD.  They told him to go home and take his medications.  Today, his HR was still high (150s) at HD.  This time he was sent to the ER.  He had mild chest tightness Monday and Wednesday while getting HD.  No lightheadedness.  He also had some mild chest tightness Sunday night that lasted for a few minutes.    At baseline, he is not very active.  He is limited by hip pain.  No exertional dyspnea or chest pain normally.   In the ER, he appeared to be in atypical atrial flutter.  HR was initially in the 150s, went down to 120s with diltiazem gtt but then remained persistently in the 120s.   Review of systems complete and found to be negative unless listed above   PMH: 1. HTN 2. ESRD 3. Tachy-brady syndrome: h/o sinus bradycardia and also atrial flutter/fibrillation with RVR.  4. Atrial arrhythmias: History of both atrial fibrillation and flutter.  Had flutter ablation in 2003 (Dr Ladona Ridgel).  5. Diastolic CHF: In setting of atrial fib/flutter with RVR.  Echo (12/13) with EF 55-60%, mild MR, mildly dilated RV.  6. Type II diabetes with neuropathy.  7. Glaucoma 8. Gout  FH: No premature CAD  History   Social History  . Marital Status: Married    Spouse Name: N/A    Number of Children: N/A  . Years of Education: N/A   Occupational History  . Not on file.    Social History Main Topics  . Smoking status: Never Smoker   . Smokeless tobacco: Never Used  . Alcohol Use: No  . Drug Use: No  . Sexual Activity:    Other Topics Concern  . Not on file   Social History Narrative  . No narrative on file    Physical Exam: Blood pressure 108/67, pulse 122, temperature 98.5 F (36.9 C), temperature source Oral, resp. rate 13, height 6\' 4"  (1.93 m), weight 225 lb (102.059 kg), SpO2 100.00%.  General: NAD Neck: JVP 8-9 cm, no thyromegaly or thyroid nodule.  Lungs: Clear to auscultation bilaterally with normal respiratory effort. CV: Nondisplaced PMI.  Heart tachy, regular S1/S2, no S3/S4, no murmur.  No peripheral edema.  No carotid bruit.  Normal pedal pulses.  Abdomen: Soft, nontender, no hepatosplenomegaly, no distention.  Skin: Intact without lesions or rashes.  Neurologic: Alert and oriented x 3.  Psych: Normal affect. Extremities: No clubbing or cyanosis.  HEENT: Normal.   Labs:   Lab Results  Component Value Date   WBC 4.5 11/05/2013   HGB 12.2* 11/05/2013   HCT 36.4* 11/05/2013   MCV 92.9 11/05/2013   PLT 183 11/05/2013    Recent Labs Lab 11/05/13 1717  NA 137  K 4.0  CL 92*  CO2 31  BUN 28*  CREATININE 7.26*  CALCIUM 8.9  GLUCOSE 224*  TnI 0.02  pBNP 8040  Radiology: - CXR: Vascular congestion  EKG: - #1 SVT with HR 150, suspect atrial flutter - #2 SVT with HR 127, suspect atypical atrial flutter  ASSESSMENT AND PLAN: 80 yo with history of ESRD, atrial fibrillation/flutter and tachy-brady syndrome (no pacemaker) presented to the ER with SVT.  1. Atrial arrhythmias: Patient appears to have recurrent atypical atrial flutter.  This is the first recognized episode since 12/13.  It is persistent, and rate so far is difficult to control. I will admit him.  - Start heparin gtt and warfarin.  Fall risk is not marked, and he has no history of GI bleeding.  However, he apparently did have trouble taking coumadin in the past.   CHADSVASC = 5.   - Continue diltiazem gtt at 5 mg/hr.  - Add amiodarone gtt 30 mg/hr.  - If he does not convert overnight and rate remains elevated, would favor TEE-guided DCCV.  Will keep him NPO for this possibility.  - Echo, cycle troponin.  2. ESRD: Will need to alert renal that he is here in the morning.   3. H/o sinus bradycardia: Will need to watch for this when he is back in NSR.   [name] [name] 11/06/2013  Patient is refusing any IV medications other than diltiazem gtt.  Sounds like he really does not want to be anticoagulated.  Therefore, cardioversion will not be an option. We can watch him overnight and try to control his rate with IV diltiazem, can potentially transition over to po diltiazem CD tomorrow if rate improves.   [name] [name] 11/06/2013

## 2013-11-06 NOTE — ED Notes (Signed)
Pt is declining anticoagulation and the Amiodaron.

## 2013-11-10 ENCOUNTER — Encounter: Payer: Self-pay | Admitting: Interventional Cardiology

## 2013-11-10 NOTE — ED Provider Notes (Signed)
Medical screening examination/treatment/procedure(s) were conducted as a shared visit with non-physician practitioner(s) and myself.  I personally evaluated the patient during the encounter. Pt presents with several days of tachycardia and intermittent CP. Initial EKG w/ narrow regular tachycardia, suspect atrial flutter given hx of same. Pt appears comfortable, pulm exam nml. Mental status nml.   IVF bolus, dilt bolus improved rate only to 120's. Cards consulted who rec trial of adenosine which slowed rate only briefly and showed what appears to be a flutter. PA Jimmey Ralpharker spoke w/ cards again who rec bolus of dilt and amio. This also did not have significant effect on rate and he was called the 3rd time with request for ED eval, will admit.   EKG Interpretation   Date/Time:  Wednesday November 05 2013 19:49:49 EDT Ventricular Rate:  127 PR Interval:  132 QRS Duration: 79 QT Interval:  326 QTC Calculation: 474 R Axis:   60 Text Interpretation:  Sinus tachycardia Borderline T abnormalities,  lateral leads Baseline wander in lead(s) V6 ED PHYSICIAN INTERPRETATION  AVAILABLE IN [facility] Confirmed by TEST, Record ([id number]) on  11/07/2013 6:56:28 AM        Toy CookeyMegan Docherty, MD 11/10/13 1536

## 2013-11-10 NOTE — Discharge Summary (Signed)
CARDIOLOGY DISCHARGE SUMMARY   Patient ID: [name] ErmRichard G Whisenant MRN: [id number] DOB/AGE: Apr 26, 1933 80 y.o.  Admit date: 11/05/2013 Discharge date: 11/06/2013  PCP: Katy ApoPOLITE,RONALD D, MD Primary Cardiologist: Dr. Katrinka BlazingSmith  Primary Discharge Diagnosis:    Atrial fibrillation  Procedures:   Hospital Course: [name] [name] is a 80 y.o. male with ESRD, atrial fibrillation/flutter (s/p ablation of the flutter in 2003) and tachy-brady syndrome (no pacemaker), who presented to the ER from HD with SVT on 10/08.   He was found to be in atrial flutter with RVR. He has refused anticoagulation in the past. He was started on IV Cardizem for rate control. His heart rate was in the 120s on the Cardizem. Medication options were discussed with the patient.   He refused to consider anti-arrhythmics or anticoagulation. Although his heart rate was still elevated and > 120. He was refusing to stay longer. He was informed that this would be against medical advice, but was still adamant about refusing.   This risks of not being treated including stroke, death, CHF and other problems were discussed with the patient. He remained adamant about leaving.   To help him, a prescription for Cardizem was sent to his pharmacy and Dr. Michaelle CopasSmith's office number was provided to the patient.  He signed out AMA on 11/06/2013. .  Labs:   Lab Results  Component Value Date   WBC 4.5 11/05/2013   HGB 12.2* 11/05/2013   HCT 36.4* 11/05/2013   MCV 92.9 11/05/2013   PLT 183 11/05/2013    Recent Labs Lab 11/05/13 1717  NA 137  K 4.0  CL 92*  CO2 31  BUN 28*  CREATININE 7.26*  CALCIUM 8.9  GLUCOSE 224*   Pro B Natriuretic peptide (BNP)  Date/Time Value Ref Range Status  11/05/2013  7:35 PM 8040.0* 0 - 450 pg/mL Final  01/10/2012  4:58 PM 3616.0* 0 - 450 pg/mL Final     Radiology: Dg Chest 2 View 11/05/2013   CLINICAL DATA:  Initial encounter for tachycardia beginning 3 days ago during dialysis.  EXAM: CHEST  2 VIEW   COMPARISON:  One-view chest 01/10/2012.  FINDINGS: The heart size is normal. Mild pulmonary vascular congestion has increased. No focal airspace disease is evident. The visualized soft tissues and bony thorax are unremarkable.  IMPRESSION: 1. Increased pulmonary vascular congestion. 2. Chronic mild interstitial coarsening.   Electronically Signed   By: Gennette Pachris  Mattern M.D.   On: 11/05/2013 19:02   EKG:  FOLLOW UP PLANS AND APPOINTMENTS No Known Allergies   Medication List    TAKE these medications       diltiazem 120 MG 24 hr capsule  Commonly known as:  CARDIZEM CD  Take 1 capsule (120 mg total) by mouth daily.      ASK your doctor about these medications       amiodarone 200 MG tablet  Commonly known as:  PACERONE  Take 200 mg by mouth at bedtime.     aspirin EC 81 MG tablet  Take 81 mg by mouth daily.     benzonatate 100 MG capsule  Commonly known as:  TESSALON  Take by mouth 3 (three) times daily as needed for cough.     Calcium Acetate 667 MG Tabs  Take 2 capsules by mouth 3 (three) times daily.     docusate sodium 100 MG capsule  Commonly known as:  COLACE  Take 100 mg by mouth 2 (two) times daily.  dorzolamide-timolol 22.3-6.8 MG/ML ophthalmic solution  Commonly known as:  COSOPT  Place 2 drops into the right eye 2 (two) times daily.     folic acid-vitamin b complex-vitamin c-selenium-zinc 3 MG Tabs tablet  Take 1 tablet by mouth daily.     glipiZIDE 10 MG tablet  Commonly known as:  GLUCOTROL  Take 10 mg by mouth 2 (two) times daily before a meal.     latanoprost 0.005 % ophthalmic solution  Commonly known as:  XALATAN  Place 1 drop into the right eye at bedtime.     prednisoLONE acetate 1 % ophthalmic suspension  Commonly known as:  PRED FORTE  Place 1 drop into the left eye daily.     simvastatin 20 MG tablet  Commonly known as:  ZOCOR  Take 20 mg by mouth every evening.     valsartan 160 MG tablet  Commonly known as:  DIOVAN  Take 160 mg by  mouth daily.             Follow-up Information   Follow up with Lesleigh NoeSMITH III,HENRY W, MD. (As needed)    Specialty:  Cardiology   Contact information:   [address] [place] [address] [phone]       BRING ALL MEDICATIONS WITH YOU TO FOLLOW UP APPOINTMENTS  Time spent with patient to include physician time: 31 min Signed: Theodore Demarkhonda Bristyl Mclees, PA-C 11/10/2013, 10:40 AM Co-Sign MD

## 2013-12-09 ENCOUNTER — Encounter: Payer: Self-pay | Admitting: Physician Assistant

## 2013-12-09 ENCOUNTER — Ambulatory Visit (INDEPENDENT_AMBULATORY_CARE_PROVIDER_SITE_OTHER): Payer: Medicare Other | Admitting: Physician Assistant

## 2013-12-09 VITALS — BP 130/80 | HR 76 | Ht 76.0 in | Wt 230.0 lb

## 2013-12-09 DIAGNOSIS — I10 Essential (primary) hypertension: Secondary | ICD-10-CM

## 2013-12-09 DIAGNOSIS — I4892 Unspecified atrial flutter: Secondary | ICD-10-CM

## 2013-12-09 DIAGNOSIS — I480 Paroxysmal atrial fibrillation: Secondary | ICD-10-CM

## 2013-12-09 DIAGNOSIS — N186 End stage renal disease: Secondary | ICD-10-CM

## 2013-12-09 NOTE — Assessment & Plan Note (Signed)
On hemodialysis

## 2013-12-09 NOTE — Patient Instructions (Signed)
Your physician recommends that you continue on your current medications as directed. Please refer to the Current Medication list given to you today.  Your physician recommends that you schedule a follow-up appointment in: 2 months with Dr.Smith

## 2013-12-09 NOTE — Assessment & Plan Note (Signed)
Patient was admitted to the hospital with rapid atrial flutter on 11/06/13. He's had prior ablation for this in 2003. He refused treatment and signed out AMA. Cardizem was prescribed but he is not taking because of severe stomach cramps. Currently he is in normal sinus rhythm. Followup with Dr. Katrinka BlazingSmith. May need followup with EP.

## 2013-12-09 NOTE — Assessment & Plan Note (Signed)
BP stable.

## 2013-12-09 NOTE — Progress Notes (Signed)
WGN:FAOZHPI:This is a 80 year old male patient Dr. Garnette ScheuermannHank [name] who has end-stage renal disease, atrial fibrillation/flutter status post ablation of flutter in 2003, tachybradycardia syndrome,who presented to the emergency room from hemodialysis with SVT on 11/06/13. He was found to be in atrial flutter with RVR. Patient was started on Cardizem and refused to consider antiarrhythmics or anticoagulation. His heart rate remained elevated over 120 and he signed out AMA.  Patient's states he took one Cardizem at home and got severe stomach cramps so stopped it. It sounds like he may have taken extra amiodarone because he thought he needed more but it's hard to get a history from him. Now he states he's only taken one amiodarone at night. He denies any chest pain, palpitations, dyspnea, dyspnea on exertion, dizziness or presyncope. He is complaining of a dry cough since he ran out of his tessalon.  Allergies  Allergen Reactions  . Diltiazem Other (See Comments)    SEVERE STOMACH CRAMPING PER PT     Current Outpatient Prescriptions  Medication Sig Dispense Refill  . amiodarone (PACERONE) 200 MG tablet Take 200 mg by mouth at bedtime.    Marland Kitchen. aspirin EC 81 MG tablet Take 81 mg by mouth daily.    . benzonatate (TESSALON) 100 MG capsule Take by mouth 3 (three) times daily as needed for cough.    . Calcium Acetate 667 MG TABS Take 2 capsules by mouth 3 (three) times daily.    Marland Kitchen. docusate sodium (COLACE) 100 MG capsule Take 100 mg by mouth 2 (two) times daily.    . dorzolamide-timolol (COSOPT) 22.3-6.8 MG/ML ophthalmic solution Place 2 drops into the right eye 2 (two) times daily.    . folic acid-vitamin b complex-vitamin c-selenium-zinc (DIALYVITE) 3 MG TABS tablet Take 1 tablet by mouth daily.    Marland Kitchen. glipiZIDE (GLUCOTROL) 10 MG tablet Take 10 mg by mouth 2 (two) times daily before a meal.    . latanoprost (XALATAN) 0.005 % ophthalmic solution Place 1 drop into the right eye at bedtime.    . prednisoLONE  acetate (PRED FORTE) 1 % ophthalmic suspension Place 1 drop into the left eye daily.    . simvastatin (ZOCOR) 20 MG tablet Take 20 mg by mouth every evening.    . valsartan (DIOVAN) 160 MG tablet Take 160 mg by mouth daily.     No current facility-administered medications for this visit.    Past Medical History  Diagnosis Date  . Renal disorder   . Diabetes mellitus without complication   . Atrial fibrillation   . Hypertension   . Glaucoma   . Peripheral neuropathy   . Gout   . Anemia     Past Surgical History  Procedure Laterality Date  . Ablation of dysrhythmic focus      No family history on file.  History   Social History  . Marital Status: Married    Spouse Name: N/A    Number of Children: N/A  . Years of Education: N/A   Occupational History  . Not on file.   Social History Main Topics  . Smoking status: Never Smoker   . Smokeless tobacco: Never Used  . Alcohol Use: No  . Drug Use: No  . Sexual Activity: Not on file   Other Topics Concern  . Not on file   Social History Narrative    ROS:see history of present illness otherwise negative  BP 130/80 mmHg  Pulse 76  Ht 6\' 4"  (1.93 m)  Wt 230 lb (104.327 kg)  BMI 28.01 kg/m2  PHYSICAL EXAM: Well-nournished, in no acute distress. Neck: No JVD, HJR, Bruit, or thyroid enlargement  Lungs: decreased breath sounds but No tachypnea, clear without wheezing, rales, or rhonchi  Cardiovascular: RRR, PMI not displaced, Normal S1 and S2, positive S4, no murmur,bruit, thrill, or heave.  Abdomen: BS normal. Soft without organomegaly, masses, lesions or tenderness.  Extremities: without cyanosis, clubbing or edema. Good distal pulses bilateral  SKin: Warm, no lesions or rashes   Musculoskeletal: No deformities  Neuro: no focal signs   Wt Readings from Last 3 Encounters:  12/09/13 230 lb (104.327 kg)  11/06/13 228 lb 6.3 oz (103.6 kg)  12/12/12 229 lb (103.874 kg)    Lab Results  Component Value Date    WBC 4.5 11/05/2013   HGB 12.2* 11/05/2013   HCT 36.4* 11/05/2013   PLT 183 11/05/2013   GLUCOSE 224* 11/05/2013   ALT 23 12/12/2012   AST 17 12/12/2012   NA 137 11/05/2013   K 4.0 11/05/2013   CL 92* 11/05/2013   CREATININE 7.26* 11/05/2013   BUN 28* 11/05/2013   CO2 31 11/05/2013   TSH 2.19 12/12/2012   INR 1.27 01/11/2012   HGBA1C 7.4* 01/10/2012    ZOX:WRUEAVEKG:normal sinus rhythm with first degree AV block   2-D echo 01/12/12 Study Conclusions  - Left ventricle: The cavity size was normal. There was mild   concentric hypertrophy. Systolic function was normal. The   estimated ejection fraction was in the range of 55% to   60%. Wall motion was normal; there were no regional wall   motion abnormalities. - Mitral valve: Mild regurgitation. - Right ventricle: The cavity size was mildly dilated. Wall   thickness was normal. - Right atrium: The atrium was mildly dilated. Transthoracic echocardiography.  M-mode, complete 2D, spectral Doppler, and color Doppler.  Height:  Height: 190.5cm. Height: 75in.  Weight:  Weight: 95kg. Weight: 209lb.  Body mass index:  BMI: 26.2kg/m^2.  Body surface area:    BSA: 2.7210m^2.  Blood pressure:     108/66.  Patient status:  Inpatient.  Location:  Bedside.

## 2013-12-09 NOTE — Assessment & Plan Note (Signed)
Patient had a recent admission with rapid atrial flutter and refused treatment and signed out AMA. The patient actually is in normal sinus rhythm today. He is not taking the Cardizem because it causes severe stomach cramps. He is on amiodarone 200 mg once daily. Recommend followup with Dr. Katrinka BlazingSmith. He does not want to early of a followup because he says he can't afford it.

## 2014-02-10 ENCOUNTER — Ambulatory Visit (INDEPENDENT_AMBULATORY_CARE_PROVIDER_SITE_OTHER): Payer: Medicare Other | Admitting: Interventional Cardiology

## 2014-02-10 ENCOUNTER — Encounter: Payer: Self-pay | Admitting: Interventional Cardiology

## 2014-02-10 VITALS — BP 162/92 | HR 74 | Ht 75.0 in | Wt 232.0 lb

## 2014-02-10 DIAGNOSIS — I10 Essential (primary) hypertension: Secondary | ICD-10-CM

## 2014-02-10 DIAGNOSIS — Z79899 Other long term (current) drug therapy: Secondary | ICD-10-CM

## 2014-02-10 DIAGNOSIS — I4892 Unspecified atrial flutter: Secondary | ICD-10-CM

## 2014-02-10 DIAGNOSIS — N186 End stage renal disease: Secondary | ICD-10-CM

## 2014-02-10 DIAGNOSIS — I495 Sick sinus syndrome: Secondary | ICD-10-CM

## 2014-02-10 NOTE — Progress Notes (Signed)
Patient ID: [name] [name], male   DOB: October 24, 1933, 81 y.o.   MRN: [id number]    [address] [place], [place]  [address] Phone: [phone] Fax:  [phone]  Date:  02/10/2014   ID:  [name] [name], DOB December 15, 1933, MRN [id number]  PCP:  Katy Apo, MD   ASSESSMENT:  1.  Amiodarone therapy  2. History of paroxysmal atrial flutter , with rhythm control on amiodarone  3.  Essential hypertension under better control since initiating dialysis  4. End stage renal disease on chronic hemodialysis  5. Tachybradycardia syndrome  PLAN:  1.  Will have a TSH and panic panel obtained when he has his next dialysis session tomorrow  2. No change in the current medical regimen  3. Clinical follow-up in  6-9 months   SUBJECTIVE: [name] [name] is a 81 y.o. male     Wt Readings from Last 3 Encounters:  02/10/14 232 lb (105.235 kg)  12/09/13 230 lb (104.327 kg)  11/06/13 228 lb 6.3 oz (103.6 kg)     Past Medical History  Diagnosis Date  . Renal disorder   . Diabetes mellitus without complication   . Atrial fibrillation   . Hypertension   . Glaucoma   . Peripheral neuropathy   . Gout   . Anemia     Current Outpatient Prescriptions  Medication Sig Dispense Refill  . amiodarone (PACERONE) 200 MG tablet Take 200 mg by mouth at bedtime.    Marland Kitchen amLODipine (NORVASC) 10 MG tablet Take 10 mg by mouth daily.     Marland Kitchen aspirin EC 81 MG tablet Take 81 mg by mouth daily.    . benzonatate (TESSALON) 100 MG capsule Take by mouth 3 (three) times daily as needed for cough.    . Calcium Acetate 667 MG TABS Take 2 capsules by mouth 3 (three) times daily.    Marland Kitchen docusate sodium (COLACE) 100 MG capsule Take 100 mg by mouth 2 (two) times daily.    . dorzolamide-timolol (COSOPT) 22.3-6.8 MG/ML ophthalmic solution Place 2 drops into the right eye 2 (two) times daily.    . folic acid-vitamin b complex-vitamin c-selenium-zinc (DIALYVITE) 3 MG TABS tablet Take 1 tablet by mouth daily.    Marland Kitchen  glipiZIDE (GLUCOTROL) 10 MG tablet Take 10 mg by mouth 2 (two) times daily before a meal.    . latanoprost (XALATAN) 0.005 % ophthalmic solution Place 1 drop into the right eye at bedtime.    . prednisoLONE acetate (PRED FORTE) 1 % ophthalmic suspension Place 1 drop into the left eye daily.    . simvastatin (ZOCOR) 20 MG tablet Take 20 mg by mouth every evening.    . valsartan (DIOVAN) 160 MG tablet Take 160 mg by mouth daily.     No current facility-administered medications for this visit.    Allergies:    Allergies  Allergen Reactions  . Diltiazem Other (See Comments)    SEVERE STOMACH CRAMPING PER PT    Social History:  The patient  reports that he has never smoked. He has never used smokeless tobacco. He reports that he does not drink alcohol or use illicit drugs.   ROS:  Please see the history of present illness.    He denies palpitations and syncope. Appetite is been stable.   All other systems reviewed and negative.   OBJECTIVE: VS:  BP 162/92 mmHg  Pulse 74  Ht  (1.905 m)  Wt 232 lb (105.235 kg)  BMI  29.00 kg/m2 Well nourished, well developed, in no acute distress,  Gaining weight HEENT: normal Neck: JVD  flat. Carotid bruit  absent  Cardiac:  normal S1, S2; RRR; no murmur Lungs:  clear to auscultation bilaterally, no wheezing, rhonchi or rales Abd: soft, nontender, no hepatomegaly Ext: Edema  flat. Pulses  absent Skin: warm and dry Neuro:  CNs 2-12 intact, no focal abnormalities noted  EKG:   Not repeated       Signed, Darci NeedleHenry W. B. Delita Chiquito III, MD 02/10/2014 10:08 AM

## 2014-02-10 NOTE — Patient Instructions (Addendum)
Your physician recommends that you continue on your current medications as directed. Please refer to the Current Medication list given to you today. Your physician wants you to follow-up in: 6-9 MONTHS WITH DR Katrinka BlazingSMITH.   You will receive a reminder letter in the mail two months in advance. If you don't receive a letter, please call our office to schedule the follow-up appointment. Your physician recommends that you have lab work drawn in dialysis tomorrow PER SCRIPT (TSH, HEPATIC PANEL)

## 2014-02-11 ENCOUNTER — Telehealth: Payer: Self-pay | Admitting: Interventional Cardiology

## 2014-02-11 DIAGNOSIS — Z79899 Other long term (current) drug therapy: Secondary | ICD-10-CM

## 2014-02-11 NOTE — Telephone Encounter (Signed)
New Message    Patient went to dialysis  clinic to get his blood work done per Dr. Michaelle CopasSmith's telling him but the clinic no longer gives out that information and needs to know what he should do, should he come here to get it done.   Please call

## 2014-02-11 NOTE — Telephone Encounter (Signed)
Returned pt call.lmtcb. Pt can schedule an lab appt for his tsh and hepatic

## 2014-02-13 NOTE — Telephone Encounter (Signed)
Spoke with pt wife. Pt is at dialysis today. She will have pt call back to schedule a lab appt (Tsh, Hepatic) to be drawn and Dr.Smith's request, orders are in Epic

## 2014-02-16 NOTE — Telephone Encounter (Signed)
Follow up     Need to exact what labs to be drawn .    Due to policy of [facility]

## 2014-02-17 NOTE — Telephone Encounter (Signed)
Written lab orders for a tsh and lft faxed to [facility] fax # [phone] attn: Jillyn HiddenGary. Results are to be faxed to our office attn: Dr.Smith

## 2014-03-19 NOTE — Telephone Encounter (Signed)
Called pt [facility] to inquire on labs that were to be drawn (Tsh, Hepatic) we still have not received results.Spoke with Angie, due to new medicare guidelines they no longer draw outside labs.  Adv pt that the labs that Dr.Smith is requesting cannot be drawn at his dialysis center due to medicare guidelines. Offered to schedule a lab appt at our office. He has transportation issues and is not able to come in to ur office. Adv him of the importance of these labs, he is currently on Amiodarone therapy. He would rather have labs drawn at his pcp(Dr.Polite) office. I will fax order for pt to have a tsh and hepatic drawn at Dr.Polite's office. He will call them to schedule his lab appt. Adv him to call back our office if he will not be able to have labs drawn there. He verbalized understanding.

## 2014-04-06 NOTE — Telephone Encounter (Signed)
Pt is not willing to come to our office for his Amiodarone maintenance labs.he has transportation issues.pt wanted his labs drawn at his dialysis center,but they do not draw outside labs.I have asked pt to have labs drawn at his pcp if more convenient, labs have not been drawn. Update fwd to Dr.Smith

## 2014-04-09 NOTE — Telephone Encounter (Signed)
We should not refill amio going forward unless we resolve this. Let him know.

## 2014-04-15 NOTE — Telephone Encounter (Signed)
Pt aware that he needs to have his maintenance labs drawn( Tsh, Hepatic) in order to continue to have his Amiodarone refilled. He sts that he was not able to have them drawn at Dr.Polite's office. Pt has an appt with Dr.Smith on 3/31 we can draw his laws that day. Adv pt of the importance of having his labs drawn every 6 months. Pt verbalized understanding.

## 2014-04-30 ENCOUNTER — Ambulatory Visit (INDEPENDENT_AMBULATORY_CARE_PROVIDER_SITE_OTHER): Payer: Medicare Other | Admitting: Interventional Cardiology

## 2014-04-30 ENCOUNTER — Encounter: Payer: Self-pay | Admitting: *Deleted

## 2014-04-30 ENCOUNTER — Encounter (INDEPENDENT_AMBULATORY_CARE_PROVIDER_SITE_OTHER): Payer: Medicare Other

## 2014-04-30 ENCOUNTER — Encounter: Payer: Self-pay | Admitting: Interventional Cardiology

## 2014-04-30 VITALS — BP 136/88 | HR 74 | Ht 75.0 in | Wt 230.1 lb

## 2014-04-30 DIAGNOSIS — I483 Typical atrial flutter: Secondary | ICD-10-CM | POA: Diagnosis not present

## 2014-04-30 DIAGNOSIS — I10 Essential (primary) hypertension: Secondary | ICD-10-CM

## 2014-04-30 DIAGNOSIS — I480 Paroxysmal atrial fibrillation: Secondary | ICD-10-CM

## 2014-04-30 DIAGNOSIS — I495 Sick sinus syndrome: Secondary | ICD-10-CM | POA: Diagnosis not present

## 2014-04-30 DIAGNOSIS — Z79899 Other long term (current) drug therapy: Secondary | ICD-10-CM

## 2014-04-30 DIAGNOSIS — N186 End stage renal disease: Secondary | ICD-10-CM

## 2014-04-30 LAB — TSH: TSH: 2.99 u[IU]/mL (ref 0.35–4.50)

## 2014-04-30 LAB — HEPATIC FUNCTION PANEL
ALT: 12 U/L (ref 0–53)
AST: 13 U/L (ref 0–37)
Albumin: 4.1 g/dL (ref 3.5–5.2)
Alkaline Phosphatase: 70 U/L (ref 39–117)
Bilirubin, Direct: 0.1 mg/dL (ref 0.0–0.3)
Total Bilirubin: 0.5 mg/dL (ref 0.2–1.2)
Total Protein: 8.3 g/dL (ref 6.0–8.3)

## 2014-04-30 MED ORDER — AMIODARONE HCL 200 MG PO TABS: 200.0000 mg | ORAL_TABLET | Freq: Every day | ORAL | Status: DC

## 2014-04-30 NOTE — Progress Notes (Signed)
Cardiology Office Note   Date:  04/30/2014   ID:  [name] [name], DOB 06-17-1933, MRN [id number]  PCP:  Katy Apo, MD  Cardiologist:  Lesleigh Noe, MD   No chief complaint on file.     History of Present Illness: [name] [name] is a 81 y.o. male who presents for hypertensive heart disease, atrial fibrillation and atrial flutter, amiodarone therapy, and end-stage renal disease. He is diabetic. He has no cardiopulmonary complaints. He denies syncope and orthopnea. He is tolerating dialysis without significant complications. He has not had angina. He feels no palpitations.    Past Medical History  Diagnosis Date  . Renal disorder   . Diabetes mellitus without complication   . Atrial fibrillation   . Hypertension   . Glaucoma   . Peripheral neuropathy   . Gout   . Anemia     Past Surgical History  Procedure Laterality Date  . Ablation of dysrhythmic focus       Current Outpatient Prescriptions  Medication Sig Dispense Refill  . amiodarone (PACERONE) 200 MG tablet Take 200 mg by mouth at bedtime.    Marland Kitchen amLODipine (NORVASC) 10 MG tablet Take 10 mg by mouth daily.     Marland Kitchen aspirin EC 81 MG tablet Take 81 mg by mouth daily.    . benzonatate (TESSALON) 100 MG capsule Take by mouth 3 (three) times daily as needed for cough.    . Calcium Acetate 667 MG TABS Take 2 capsules by mouth 3 (three) times daily.    Marland Kitchen docusate sodium (COLACE) 100 MG capsule Take 100 mg by mouth 2 (two) times daily.    . dorzolamide-timolol (COSOPT) 22.3-6.8 MG/ML ophthalmic solution Place 2 drops into the right eye 2 (two) times daily.    . folic acid-vitamin b complex-vitamin c-selenium-zinc (DIALYVITE) 3 MG TABS tablet Take 1 tablet by mouth daily.    Marland Kitchen glipiZIDE (GLUCOTROL) 10 MG tablet Take 10 mg by mouth 2 (two) times daily before a meal.    . latanoprost (XALATAN) 0.005 % ophthalmic solution Place 1 drop into the right eye at bedtime.    . prednisoLONE acetate (PRED FORTE) 1 %  ophthalmic suspension Place 1 drop into the left eye daily.    . simvastatin (ZOCOR) 20 MG tablet Take 20 mg by mouth every evening.    . valsartan (DIOVAN) 160 MG tablet Take 160 mg by mouth daily.     No current facility-administered medications for this visit.    Allergies:   Diltiazem    Social History:  The patient  reports that he has never smoked. He has never used smokeless tobacco. He reports that he does not drink alcohol or use illicit drugs.   Family History:  The patient's Family history is unknown by patient.    ROS:  Please see the history of present illness.   Otherwise, review of systems are positive for blind left eye. End-stage kidney disease on dialysis. No recent laboratory data for amiodarone.   All other systems are reviewed and negative.    PHYSICAL EXAM: VS:  BP 136/88 mmHg  Pulse 74  Ht  (1.905 m)  Wt 230 lb 1.9 oz (104.382 kg)  BMI 28.76 kg/m2 , BMI Body mass index is 28.76 kg/(m^2). GEN: Well nourished, well developed, in no acute distress HEENT: normal Neck: no JVD, carotid bruits, or masses Cardiac: RRR; no murmurs, rubs, or gallops,no edema  Respiratory:  clear to auscultation bilaterally, normal work of breathing GI:  soft, nontender, nondistended, + BS MS: no deformity or atrophy Skin: warm and dry, no rash Neuro:  Strength and sensation are intact Psych: euthymic mood, full affect   EKG:  EKG is ordered today. The ekg ordered today demonstrates atrial flutter with 4-1 AV conduction at heart rate of 74 bpm.   Recent Labs: 11/05/2013: BUN 28*; Creatinine 7.26*; Hemoglobin 12.2*; Platelets 183; Potassium 4.0; Pro B Natriuretic peptide (BNP) 8040.0*; Sodium 137    Lipid Panel No results found for: CHOL, TRIG, HDL, CHOLHDL, VLDL, LDLCALC, LDLDIRECT    Wt Readings from Last 3 Encounters:  04/30/14 230 lb 1.9 oz (104.382 kg)  02/10/14 232 lb (105.235 kg)  12/09/13 230 lb (104.327 kg)      Other studies Reviewed: Additional studies/  records that were reviewed today include:none. Review of the above records demonstrates:    ASSESSMENT AND PLAN:  Sinus bradycardia-tachycardia syndrome: See below  On amiodarone therapy: No obvious toxicity  Typical atrial flutter: Recurrent with 41 AV conduction on today's EKG  ESRD (end stage renal disease): No complications  Essential hypertension: Controlled  Paroxysmal atrial fibrillation: Not currently present     Current medicines are reviewed at length with the patient today.  The patient has concerns regarding medicines.  The following changes have been made:  Medications were reviewed. Now that he is back in atrial flutter despite amiodarone therapy, we will consider medication adjustment and possibly cardioversion, but first we will do a 24-hour Holter monitor to consider rate control since he is asymptomatic. May need to switch amlodipine to diltiazem.  Labs/ tests ordered today include:   Orders Placed This Encounter  Procedures  . TSH  . Hepatic function panel  . EKG 12-Lead  . Holter monitor - 24 hour     Disposition:   FU with Mendel RyderH. Tryson Lumley  in 6 months. Will likely be seen sooner depending upon the results of the Holter. May need to change amlodipine to diltiazem.   Signed, Lesleigh NoeSMITH III,Emmagrace Runkel W, MD  04/30/2014 9:16 AM    [facility] [address], [place], [place]  [address] Phone: [phone]) [phone]; Fax: [phone]) [phone]

## 2014-04-30 NOTE — Progress Notes (Signed)
Patient ID: [name] [name], male   DOB: 04/15/1933, 81 y.o.   MRN: [id number] [employer] 24 hour holter monitor applied to patient.

## 2014-04-30 NOTE — Patient Instructions (Signed)
Your physician recommends that you continue on your current medications as directed. Please refer to the Current Medication list given to you today.  Lab Today: Tsh, Hepatic  Your physician has recommended that you wear a holter monitor. Holter monitors are medical devices that record the heart's electrical activity. Doctors most often use these monitors to diagnose arrhythmias. Arrhythmias are problems with the speed or rhythm of the heartbeat. The monitor is a small, portable device. You can wear one while you do your normal daily activities. This is usually used to diagnose what is causing palpitations/syncope (passing out).  Your physician wants you to follow-up in: 6 months with Dr.Smith You will receive a reminder letter in the mail two months in advance. If you don't receive a letter, please call our office to schedule the follow-up appointment.

## 2014-05-07 ENCOUNTER — Telehealth: Payer: Self-pay | Admitting: Interventional Cardiology

## 2014-05-07 DIAGNOSIS — I4892 Unspecified atrial flutter: Secondary | ICD-10-CM

## 2014-05-07 NOTE — Telephone Encounter (Signed)
Pt would like a call re lab results--pls call

## 2014-05-07 NOTE — Telephone Encounter (Signed)
The pt has been given his normal lab results per Dr Katrinka BlazingSmith. He verbalized understanding.

## 2014-05-08 NOTE — Telephone Encounter (Signed)
Called to give pt monitor results and Dr.Smith's recommendation. Pt wife sts pt is currently at dialysis. Left a message with her for pt to call the office Monday morning

## 2014-05-11 NOTE — Telephone Encounter (Signed)
Follow Up  Pt calling back to speak w/ Rn about results. Pt at [facility] and can call back to the Home # tomorrow morning. Please call back and discuss.

## 2014-05-12 ENCOUNTER — Telehealth: Payer: Self-pay | Admitting: Interventional Cardiology

## 2014-05-12 NOTE — Telephone Encounter (Signed)
Returned pt call. Pt wants to speak with Dr.Smith. Advised him that Dr.Smith is out of the office. I have fwd Dr.Smith a message to call him. Pt verbalized understanding.

## 2014-05-12 NOTE — Telephone Encounter (Signed)
New Message    Patient is calling back to speak to nurse. Please give patient a call back.  Thanks

## 2014-05-12 NOTE — Telephone Encounter (Signed)
Pt aware of Holter monitor results and Dr.Smith's recommendation. -A Flutter  -Varible VR  -Mean HR 84bpm -Needs to start Coumadin and be therapeutic for 4 weeks -Then schedule a Cardioversion  Pt does not want to be on coumadin, adv him he would not be a candidate for other anti-coag medications because he is a dialysis pt. Pt has several questions and concerns, he rqst to speak with Dr.Smith directly. Adv him I will fwd Dr.Smith a message to call him. Pt verbalized understanding

## 2014-05-15 NOTE — Telephone Encounter (Signed)
Spoke to wife. Patient is at dialysis earlier today and now he has gone out to run an errand. We will try to speak again on Monday.

## 2014-06-09 MED ORDER — WARFARIN SODIUM 5 MG PO TABS: 5.0000 mg | ORAL_TABLET | Freq: Every day | ORAL | Status: DC

## 2014-06-09 NOTE — Telephone Encounter (Signed)
Pt aware of Dr.Smith's plan to schedule him for dccv in 3-4 weeks. Pt given instructions on starting coumadin. Rx sent to pt pharmacy for starting dose of 5mg  qd,pt will pick up and start today. Adv pt to take that dosage until instructed otherwise by the coumadin clinic. Pt aware of new pt coumadin appt scheduled for 4/17 @2 :30pm. Pt agreeable and verbalized understanding.

## 2014-06-09 NOTE — Telephone Encounter (Signed)
Set up coumadin clinic initiation of therapy for cardioversion in next 4-5 weeks. He will need to see me in the office 3 weeks after the coumadin clinic starts

## 2014-06-16 ENCOUNTER — Ambulatory Visit (INDEPENDENT_AMBULATORY_CARE_PROVIDER_SITE_OTHER): Payer: Medicare Other | Admitting: Surgery

## 2014-06-16 DIAGNOSIS — I4892 Unspecified atrial flutter: Secondary | ICD-10-CM

## 2014-06-16 DIAGNOSIS — Z5181 Encounter for therapeutic drug level monitoring: Secondary | ICD-10-CM | POA: Diagnosis not present

## 2014-06-16 DIAGNOSIS — I4891 Unspecified atrial fibrillation: Secondary | ICD-10-CM

## 2014-06-16 DIAGNOSIS — I480 Paroxysmal atrial fibrillation: Secondary | ICD-10-CM

## 2014-06-16 LAB — POCT INR: INR: 1.4

## 2014-06-16 MED ORDER — WARFARIN SODIUM 5 MG PO TABS: ORAL_TABLET | ORAL | Status: DC

## 2014-06-16 NOTE — Patient Instructions (Signed)

## 2014-06-23 ENCOUNTER — Ambulatory Visit (INDEPENDENT_AMBULATORY_CARE_PROVIDER_SITE_OTHER): Payer: Medicare Other | Admitting: Surgery

## 2014-06-23 DIAGNOSIS — I4891 Unspecified atrial fibrillation: Secondary | ICD-10-CM | POA: Diagnosis not present

## 2014-06-23 DIAGNOSIS — Z5181 Encounter for therapeutic drug level monitoring: Secondary | ICD-10-CM

## 2014-06-23 DIAGNOSIS — I4892 Unspecified atrial flutter: Secondary | ICD-10-CM | POA: Diagnosis not present

## 2014-06-23 LAB — POCT INR: INR: 1.7

## 2014-06-30 ENCOUNTER — Ambulatory Visit (INDEPENDENT_AMBULATORY_CARE_PROVIDER_SITE_OTHER): Payer: Medicare Other | Admitting: Pharmacist Clinician (PhC)/ Clinical Pharmacy Specialist

## 2014-06-30 DIAGNOSIS — Z5181 Encounter for therapeutic drug level monitoring: Secondary | ICD-10-CM

## 2014-06-30 DIAGNOSIS — I4892 Unspecified atrial flutter: Secondary | ICD-10-CM | POA: Diagnosis not present

## 2014-06-30 DIAGNOSIS — I4891 Unspecified atrial fibrillation: Secondary | ICD-10-CM | POA: Diagnosis not present

## 2014-06-30 LAB — POCT INR: INR: 2.9

## 2014-07-02 ENCOUNTER — Ambulatory Visit: Payer: Medicare Other | Admitting: Interventional Cardiology

## 2014-07-07 ENCOUNTER — Ambulatory Visit (INDEPENDENT_AMBULATORY_CARE_PROVIDER_SITE_OTHER): Payer: Medicare Other | Admitting: *Deleted

## 2014-07-07 DIAGNOSIS — I4891 Unspecified atrial fibrillation: Secondary | ICD-10-CM | POA: Diagnosis not present

## 2014-07-07 DIAGNOSIS — Z5181 Encounter for therapeutic drug level monitoring: Secondary | ICD-10-CM | POA: Diagnosis not present

## 2014-07-07 DIAGNOSIS — I4892 Unspecified atrial flutter: Secondary | ICD-10-CM | POA: Diagnosis not present

## 2014-07-07 LAB — POCT INR: INR: 1.9

## 2014-07-10 ENCOUNTER — Emergency Department (HOSPITAL_BASED_OUTPATIENT_CLINIC_OR_DEPARTMENT_OTHER): Payer: Medicare Other

## 2014-07-10 ENCOUNTER — Emergency Department (HOSPITAL_BASED_OUTPATIENT_CLINIC_OR_DEPARTMENT_OTHER)
Admission: EM | Admit: 2014-07-10 | Discharge: 2014-07-10 | Disposition: A | Discharge status: Home / Self Care | Payer: Medicare Other | Attending: Emergency Medicine | Admitting: Emergency Medicine

## 2014-07-10 ENCOUNTER — Encounter (HOSPITAL_BASED_OUTPATIENT_CLINIC_OR_DEPARTMENT_OTHER): Payer: Self-pay | Admitting: *Deleted

## 2014-07-10 DIAGNOSIS — I120 Hypertensive chronic kidney disease with stage 5 chronic kidney disease or end stage renal disease: Secondary | ICD-10-CM | POA: Diagnosis not present

## 2014-07-10 DIAGNOSIS — N186 End stage renal disease: Secondary | ICD-10-CM | POA: Diagnosis not present

## 2014-07-10 DIAGNOSIS — R011 Cardiac murmur, unspecified: Secondary | ICD-10-CM | POA: Diagnosis not present

## 2014-07-10 DIAGNOSIS — E119 Type 2 diabetes mellitus without complications: Secondary | ICD-10-CM | POA: Insufficient documentation

## 2014-07-10 DIAGNOSIS — Z7952 Long term (current) use of systemic steroids: Secondary | ICD-10-CM | POA: Insufficient documentation

## 2014-07-10 DIAGNOSIS — R42 Dizziness and giddiness: Secondary | ICD-10-CM | POA: Diagnosis not present

## 2014-07-10 DIAGNOSIS — I4891 Unspecified atrial fibrillation: Secondary | ICD-10-CM | POA: Insufficient documentation

## 2014-07-10 DIAGNOSIS — Z992 Dependence on renal dialysis: Secondary | ICD-10-CM | POA: Diagnosis not present

## 2014-07-10 DIAGNOSIS — D649 Anemia, unspecified: Secondary | ICD-10-CM | POA: Diagnosis not present

## 2014-07-10 DIAGNOSIS — Z79899 Other long term (current) drug therapy: Secondary | ICD-10-CM | POA: Insufficient documentation

## 2014-07-10 DIAGNOSIS — H409 Unspecified glaucoma: Secondary | ICD-10-CM | POA: Insufficient documentation

## 2014-07-10 DIAGNOSIS — Z8739 Personal history of other diseases of the musculoskeletal system and connective tissue: Secondary | ICD-10-CM | POA: Insufficient documentation

## 2014-07-10 DIAGNOSIS — Y92008 Other place in unspecified non-institutional (private) residence as the place of occurrence of the external cause: Secondary | ICD-10-CM | POA: Diagnosis not present

## 2014-07-10 DIAGNOSIS — R55 Syncope and collapse: Secondary | ICD-10-CM | POA: Diagnosis not present

## 2014-07-10 DIAGNOSIS — S0003XA Contusion of scalp, initial encounter: Secondary | ICD-10-CM | POA: Insufficient documentation

## 2014-07-10 DIAGNOSIS — Y9389 Activity, other specified: Secondary | ICD-10-CM | POA: Insufficient documentation

## 2014-07-10 DIAGNOSIS — W07XXXA Fall from chair, initial encounter: Secondary | ICD-10-CM | POA: Insufficient documentation

## 2014-07-10 DIAGNOSIS — Z7901 Long term (current) use of anticoagulants: Secondary | ICD-10-CM | POA: Insufficient documentation

## 2014-07-10 DIAGNOSIS — Z7982 Long term (current) use of aspirin: Secondary | ICD-10-CM | POA: Diagnosis not present

## 2014-07-10 DIAGNOSIS — Y998 Other external cause status: Secondary | ICD-10-CM | POA: Diagnosis not present

## 2014-07-10 LAB — CBC WITH DIFFERENTIAL/PLATELET
BASOS PCT: 0 % (ref 0–1)
Basophils Absolute: 0 10*3/uL (ref 0.0–0.1)
EOS ABS: 0.1 10*3/uL (ref 0.0–0.7)
Eosinophils Relative: 1 % (ref 0–5)
HEMATOCRIT: 38.2 % — AB (ref 39.0–52.0)
HEMOGLOBIN: 12.8 g/dL — AB (ref 13.0–17.0)
LYMPHS ABS: 1.2 10*3/uL (ref 0.7–4.0)
Lymphocytes Relative: 23 % (ref 12–46)
MCH: 31.4 pg (ref 26.0–34.0)
MCHC: 33.5 g/dL (ref 30.0–36.0)
MCV: 93.6 fL (ref 78.0–100.0)
MONO ABS: 0.6 10*3/uL (ref 0.1–1.0)
Monocytes Relative: 12 % (ref 3–12)
NEUTROS PCT: 64 % (ref 43–77)
Neutro Abs: 3.4 10*3/uL (ref 1.7–7.7)
PLATELETS: 160 10*3/uL (ref 150–400)
RBC: 4.08 MIL/uL — AB (ref 4.22–5.81)
RDW: 13.6 % (ref 11.5–15.5)
WBC: 5.3 10*3/uL (ref 4.0–10.5)

## 2014-07-10 LAB — BASIC METABOLIC PANEL
Anion gap: 13 (ref 5–15)
BUN: 29 mg/dL — ABNORMAL HIGH (ref 6–20)
CO2: 28 mmol/L (ref 22–32)
CREATININE: 6.94 mg/dL — AB (ref 0.61–1.24)
Calcium: 7.6 mg/dL — ABNORMAL LOW (ref 8.9–10.3)
Chloride: 93 mmol/L — ABNORMAL LOW (ref 101–111)
GFR calc Af Amer: 8 mL/min — ABNORMAL LOW (ref >60)
GFR calc non Af Amer: 7 mL/min — ABNORMAL LOW (ref >60)
GLUCOSE: 226 mg/dL — AB (ref 65–99)
POTASSIUM: 3.5 mmol/L (ref 3.5–5.1)
Sodium: 134 mmol/L — ABNORMAL LOW (ref 135–145)

## 2014-07-10 LAB — PROTIME-INR
INR: 2.02 — ABNORMAL HIGH (ref 0.00–1.49)
Prothrombin Time: 22.7 seconds — ABNORMAL HIGH (ref 11.6–15.2)

## 2014-07-10 NOTE — ED Notes (Signed)
Pt is a Mon-Wed-Fri dialyzer and did dialyze today. This afternoon, he became light headed and had a near syncopal episode. Fire and EMS were on the scene but pt refused transport with them.

## 2014-07-10 NOTE — ED Provider Notes (Signed)
CSN: [id number]     Arrival date & time 07/10/14  1827 History  This chart was scribed for Pricilla Loveless, MD by Bronson Curb, ED Scribe. This patient was seen in room MH01/MH01 and the patient's care was started at 6:37 PM.   Chief Complaint  Patient presents with  . Near Syncope    The history is provided by the patient. No language interpreter was used.     HPI Comments: [name] [name] is a 81 y.o. male who presents to the Emergency Department complaining of a syncopal spell that occurred PTA. Patient states he was sitting outside when he began to feel light-headed and subsequently fell out of his chair, hitting the left side of his head. He also notes an associated abrasion to the left elbow. Patient has history of renal disorder and receives dialysis treatment every Monday, Wednesday, and Friday. His last treatment was today and ended approximately 2 hours ago, prior to the near syncopal episode. He denies chest pain or SOB. No palpitations. Feels normal at this time.  Past Medical History  Diagnosis Date  . Renal disorder   . Diabetes mellitus without complication   . Atrial fibrillation   . Hypertension   . Glaucoma   . Peripheral neuropathy   . Gout   . Anemia    Past Surgical History  Procedure Laterality Date  . Ablation of dysrhythmic focus    . Sp av dialysis shunt access existing *l*     Family History  Problem Relation Age of Onset  . Family history unknown: Yes   History  Substance Use Topics  . Smoking status: Never Smoker   . Smokeless tobacco: Never Used  . Alcohol Use: No    Review of Systems  Respiratory: Negative for shortness of breath.   Cardiovascular: Positive for near-syncope. Negative for chest pain.  Skin: Positive for wound (abrasion).  Neurological: Positive for syncope and light-headedness.  All other systems reviewed and are negative.     Allergies  Diltiazem  Home Medications   Prior to Admission medications   Medication  Sig Start Date End Date Taking? Authorizing Provider  amiodarone (PACERONE) 200 MG tablet Take 1 tablet (200 mg total) by mouth at bedtime. 04/30/14   Lyn Records, MD  amLODipine (NORVASC) 10 MG tablet Take 10 mg by mouth daily.  01/09/14   Historical Provider, MD  aspirin EC 81 MG tablet Take 81 mg by mouth daily.    Historical Provider, MD  benzonatate (TESSALON) 100 MG capsule Take by mouth 3 (three) times daily as needed for cough.    Historical Provider, MD  Calcium Acetate 667 MG TABS Take 2 capsules by mouth 3 (three) times daily.    Historical Provider, MD  docusate sodium (COLACE) 100 MG capsule Take 100 mg by mouth 2 (two) times daily.    Historical Provider, MD  dorzolamide-timolol (COSOPT) 22.3-6.8 MG/ML ophthalmic solution Place 2 drops into the right eye 2 (two) times daily.    Historical Provider, MD  folic acid-vitamin b complex-vitamin c-selenium-zinc (DIALYVITE) 3 MG TABS tablet Take 1 tablet by mouth daily.    Historical Provider, MD  glipiZIDE (GLUCOTROL) 10 MG tablet Take 10 mg by mouth 2 (two) times daily before a meal.    Historical Provider, MD  latanoprost (XALATAN) 0.005 % ophthalmic solution Place 1 drop into the right eye at bedtime.    Historical Provider, MD  prednisoLONE acetate (PRED FORTE) 1 % ophthalmic suspension Place 1 drop into the left  eye daily.    Historical Provider, MD  simvastatin (ZOCOR) 20 MG tablet Take 20 mg by mouth every evening.    Historical Provider, MD  valsartan (DIOVAN) 160 MG tablet Take 160 mg by mouth daily.    Historical Provider, MD  warfarin (COUMADIN) 5 MG tablet Take as directed by the coumadin clinic 06/16/14   Lyn Records, MD   Triage Vitals: BP 148/81 mmHg  Pulse 74  Temp(Src) 97.7 F (36.5 C) (Oral)  Resp 18  Ht 6\' 4"  (1.93 m)  Wt 225 lb (102.059 kg)  BMI 27.40 kg/m2  SpO2 99%  Physical Exam  Constitutional: He is oriented to person, place, and time. He appears well-developed and well-nourished.  HENT:  Head:  Normocephalic.  Right Ear: External ear normal.  Left Ear: External ear normal.  Nose: Nose normal.  Moderate left scalp hematoma.  Eyes: Right eye exhibits no discharge. Left eye exhibits no discharge.  Neck: Neck supple.  Cardiovascular: Normal rate, regular rhythm and intact distal pulses.   Murmur heard. Pulmonary/Chest: Effort normal and breath sounds normal.  Abdominal: Soft. There is no tenderness.  Musculoskeletal: He exhibits no edema.  Neurological: He is alert and oriented to person, place, and time.  Skin: Skin is warm and dry.  Nursing note and vitals reviewed.   ED Course  Procedures (including critical care time)  DIAGNOSTIC STUDIES: Oxygen Saturation is 99% on room air, normal by my interpretation.    COORDINATION OF CARE: At 1840 Discussed treatment plan with patient which includes labs and imaging. Patient agrees.   Labs Review Labs Reviewed  BASIC METABOLIC PANEL - Abnormal; Notable for the following:    Sodium 134 (*)    Chloride 93 (*)    Glucose, Bld 226 (*)    BUN 29 (*)    Creatinine, Ser 6.94 (*)    Calcium 7.6 (*)    GFR calc non Af Amer 7 (*)    GFR calc Af Amer 8 (*)    All other components within normal limits  CBC WITH DIFFERENTIAL/PLATELET - Abnormal; Notable for the following:    RBC 4.08 (*)    Hemoglobin 12.8 (*)    HCT 38.2 (*)    All other components within normal limits  PROTIME-INR - Abnormal; Notable for the following:    Prothrombin Time 22.7 (*)    INR 2.02 (*)    All other components within normal limits    Imaging Review Ct Head Wo Contrast  07/10/2014   CLINICAL DATA:  Left head injury from falling. No headache or dizziness. Initial encounter.  EXAM: CT HEAD WITHOUT CONTRAST  TECHNIQUE: Contiguous axial images were obtained from the base of the skull through the vertex without intravenous contrast.  COMPARISON:  Report from prior study dated 04/06/2013-unavailable.  FINDINGS: There is no evidence of acute intracranial  hemorrhage, mass lesion, brain edema or extra-axial fluid collection. The ventricles and subarachnoid spaces are diffusely prominent consistent with moderate atrophy. There is no CT evidence of acute cortical infarction. Relatively mild periventricular white matter disease is present. There is no hydrocephalus.  Intracranial vascular calcifications are noted. Calcification and increased density are noted within the left globe. The visualized paranasal sinuses, mastoid air cells and middle ears are clear. The calvarium is intact.  IMPRESSION: 1. No acute posttraumatic findings. 2. Atrophy and chronic small vessel ischemic changes. 3. 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   Electronically Signed   By: Carey Bullocks M.D.   On: 07/10/2014 19:30  Dg Chest Port 1 View  07/10/2014   CLINICAL DATA:  Status post fall off bench. Arrhythmia. Concern for chest injury. Initial encounter.  EXAM: PORTABLE CHEST - 1 VIEW  COMPARISON:  Chest radiograph performed 11/05/2013  FINDINGS: The lungs are well-aerated. Vascular congestion is noted. Mild left basilar atelectasis is seen. There is no evidence of pleural effusion or pneumothorax.  The cardiomediastinal silhouette is within normal limits. No acute osseous abnormalities are seen.  IMPRESSION: Vascular congestion noted. Mild left basilar atelectasis seen. No displaced rib fractures identified.   Electronically Signed   By: Roanna Raider M.D.   On: 07/10/2014 19:39     EKG Interpretation   Date/Time:  Friday July 10 2014 18:35:35 EDT Ventricular Rate:  74 PR Interval:  234 QRS Duration: 88 QT Interval:  410 QTC Calculation: 455 R Axis:   70 Text Interpretation:  Sinus rhythm with 1st degree A-V block Cannot rule  out Anterior infarct , age undetermined Abnormal ECG rate is slower  compared to Oct 2015 Confirmed by Brynnan Rodenbaugh  MD, Tarryn Bogdan [phone]) on 07/10/2014  6:44:23 PM      MDM   Final diagnoses:  Syncope and collapse    Patient has been in NSR since arrival. No  afib at this time but has long standing injury. No significant injuries from fall. No ACS symptoms. I recommended consult with cardiology and likely observation given his arrhythmia history but patient is adamant about going home. He feels this was heat related though his temp has been normal. He is not altered and is capable of making his own decisions. I had a long discussion about my concerns for arrhythmia (afib or ventricular) but he still wants to go home and understands risks. Discussed strict return precautions.  I personally performed the services described in this documentation, which was scribed in my presence. The recorded information has been reviewed and is accurate.    Pricilla Loveless, MD 07/11/14 1415

## 2014-07-10 NOTE — ED Notes (Signed)
MD at bedside. 

## 2014-07-10 NOTE — ED Notes (Signed)
Dr Criss Alvine in room with patient now.

## 2014-07-10 NOTE — ED Notes (Signed)
Pt refuses IV stick and blood work at this time.

## 2014-07-10 NOTE — ED Notes (Signed)
Pt refusing IV access.

## 2014-07-10 NOTE — ED Notes (Signed)
Pt transported to radiology by radiology technician.

## 2014-07-14 ENCOUNTER — Ambulatory Visit (INDEPENDENT_AMBULATORY_CARE_PROVIDER_SITE_OTHER): Payer: Medicare Other | Admitting: Pharmacist

## 2014-07-14 DIAGNOSIS — Z5181 Encounter for therapeutic drug level monitoring: Secondary | ICD-10-CM

## 2014-07-14 DIAGNOSIS — I4892 Unspecified atrial flutter: Secondary | ICD-10-CM | POA: Diagnosis not present

## 2014-07-14 DIAGNOSIS — I4891 Unspecified atrial fibrillation: Secondary | ICD-10-CM | POA: Diagnosis not present

## 2014-07-14 LAB — POCT INR: INR: 2.4

## 2014-07-21 ENCOUNTER — Ambulatory Visit (INDEPENDENT_AMBULATORY_CARE_PROVIDER_SITE_OTHER): Payer: Medicare Other | Admitting: *Deleted

## 2014-07-21 DIAGNOSIS — Z5181 Encounter for therapeutic drug level monitoring: Secondary | ICD-10-CM | POA: Diagnosis not present

## 2014-07-21 DIAGNOSIS — I4891 Unspecified atrial fibrillation: Secondary | ICD-10-CM

## 2014-07-21 DIAGNOSIS — I4892 Unspecified atrial flutter: Secondary | ICD-10-CM | POA: Diagnosis not present

## 2014-07-21 LAB — POCT INR: INR: 1.3

## 2014-07-22 ENCOUNTER — Telehealth: Payer: Self-pay | Admitting: *Deleted

## 2014-07-22 NOTE — Telephone Encounter (Signed)
Patient called and stated that he has been taking Amlodipine and Tessalon prior to last visit but no other new medications and he thinks the medications interfered with his INR level on yesterday. Advised that the two medications are safe to take with Coumadin/Warfarin, he insisted that is the only the he has done differently since last visit and they are the issue. He states he is stopping those medications today. Educated him on the importance of taking his Amlodipine as instructed and ordered by Physician and to use his Tessalon as needed as ordered by the Physician.

## 2014-07-28 ENCOUNTER — Encounter: Payer: Self-pay | Admitting: Nurse Practitioner

## 2014-07-28 ENCOUNTER — Ambulatory Visit (INDEPENDENT_AMBULATORY_CARE_PROVIDER_SITE_OTHER): Payer: Medicare Other | Admitting: Nurse Practitioner

## 2014-07-28 ENCOUNTER — Ambulatory Visit (INDEPENDENT_AMBULATORY_CARE_PROVIDER_SITE_OTHER): Payer: Medicare Other

## 2014-07-28 VITALS — BP 118/70 | HR 80 | Resp 18 | Ht 75.0 in | Wt 231.0 lb

## 2014-07-28 DIAGNOSIS — Z5181 Encounter for therapeutic drug level monitoring: Secondary | ICD-10-CM

## 2014-07-28 DIAGNOSIS — N186 End stage renal disease: Secondary | ICD-10-CM

## 2014-07-28 DIAGNOSIS — I4891 Unspecified atrial fibrillation: Secondary | ICD-10-CM | POA: Diagnosis not present

## 2014-07-28 DIAGNOSIS — I4892 Unspecified atrial flutter: Secondary | ICD-10-CM

## 2014-07-28 DIAGNOSIS — I10 Essential (primary) hypertension: Secondary | ICD-10-CM | POA: Diagnosis not present

## 2014-07-28 LAB — POCT INR: INR: 3.6

## 2014-07-28 NOTE — Progress Notes (Signed)
CARDIOLOGY OFFICE NOTE  Date:  07/28/2014    [name] [name] Date of Birth: [birth year] Medical Record #[id number]  PCP:  [name] Apo, MD  Cardiologist:  [name] [name]    Chief Complaint  Patient presents with  . Atrial Flutter    Follow up visit - seen for Dr. Katrinka [name].   . Loss of Consciousness    History of Present Illness: [name] [name] is a 80 y.o. male who presents today for a follow up visit. Seen for Dr. Katrinka [name]. He has a history of hypertensive heart disease, atrial fibrillation and atrial flutter, chronic amiodarone therapy, and end-stage renal disease - on dialysis. He is diabetic.   He was last seen here back in March - noted to be in atrial flutter. Coumadin started. Holter obtained. Was to have cardioversion after 3 to 4 weeks of therapeutic INR's - looks like INR has NOT been therapeutic.  In the ER 2 weeks ago with near syncope spell - refused admission. He had had been sitting outside when he began to feel light-headed and subsequently fell out of his chair, hitting the left side of his head. He also notes an associated abrasion to the left elbow. He had had his dialysis that morning. He was noted to be in NSR.   Comes back today. Here alone today. He says "everything is wrong" but he is not able to tell me any specifics. He denies chest pain, not short of breath, not dizzy or lightheaded. No more passing out/presyncopal spells. Some fatigue. No falls. Asking about the "conversion" - wondering if that would make him walk better.    Past Medical History  Diagnosis Date  . Renal disorder   . Diabetes mellitus without complication   . Atrial fibrillation   . Hypertension   . Glaucoma   . Peripheral neuropathy   . Gout   . Anemia     Past Surgical History  Procedure Laterality Date  . Ablation of dysrhythmic focus    . Sp av dialysis shunt access existing *l*       Medications: Current Outpatient Prescriptions  Medication Sig Dispense Refill  . amiodarone  (PACERONE) 200 MG tablet Take 1 tablet (200 mg total) by mouth at bedtime. 90 tablet 1  . amLODipine (NORVASC) 10 MG tablet Take 10 mg by mouth daily.     Marland Kitchen aspirin EC 81 MG tablet Take 81 mg by mouth daily.    . Calcium Acetate 667 MG TABS Take 2 capsules by mouth 3 (three) times daily.    Marland Kitchen docusate sodium (COLACE) 100 MG capsule Take 100 mg by mouth 2 (two) times daily.    . dorzolamide-timolol (COSOPT) 22.3-6.8 MG/ML ophthalmic solution Place 2 drops into the right eye 2 (two) times daily.    . folic acid-vitamin b complex-vitamin c-selenium-zinc (DIALYVITE) 3 MG TABS tablet Take 1 tablet by mouth daily.    Marland Kitchen glipiZIDE (GLUCOTROL) 10 MG tablet Take 10 mg by mouth 2 (two) times daily before a meal.    . latanoprost (XALATAN) 0.005 % ophthalmic solution Place 1 drop into the right eye at bedtime.    . prednisoLONE acetate (PRED FORTE) 1 % ophthalmic suspension Place 1 drop into the left eye daily.    . simvastatin (ZOCOR) 20 MG tablet Take 20 mg by mouth every evening.    . valsartan (DIOVAN) 160 MG tablet Take 160 mg by mouth daily.    Marland Kitchen warfarin (COUMADIN) 5 MG tablet Take as directed by the  coumadin clinic 40 tablet 1  . benzonatate (TESSALON) 100 MG capsule Take by mouth 3 (three) times daily as needed for cough.     No current facility-administered medications for this visit.    Allergies: Allergies  Allergen Reactions  . Diltiazem Other (See Comments)    SEVERE STOMACH CRAMPING PER PT    Social History: The patient  reports that he has never smoked. He has never used smokeless tobacco. He reports that he does not drink alcohol or use illicit drugs.   Family History: The patient's Family history is unknown by patient.   Review of Systems: Please see the history of present illness.   Otherwise, the review of systems is positive for none.   All other systems are reviewed and negative.   Physical Exam: VS:  BP 118/70 mmHg  Pulse 80  Resp 18  Ht 6\' 3"  (1.905 m)  Wt 231 lb  (104.781 kg)  BMI 28.87 kg/m2  SpO2 98% .  BMI Body mass index is 28.87 kg/(m^2).  Wt Readings from Last 3 Encounters:  07/28/14 231 lb (104.781 kg)  07/10/14 225 lb (102.059 kg)  04/30/14 230 lb 1.9 oz (104.382 kg)    General: Pleasant. Well developed, well nourished and in no acute distress.  HEENT: Normal except for eye deformity. Has no teeth  Neck: Supple, no JVD, carotid bruits, or masses noted.  Cardiac: Regular rate and rhythm. Soft outflow murmur. No edema.  Respiratory:  Lungs are clear to auscultation bilaterally with normal work of breathing.  GI: Soft and nontender.  MS: No deformity or atrophy. Gait and ROM intact. He is using a cane.  Skin: Warm and dry. Color is normal.  Neuro:  Strength and sensation are intact and no gross focal deficits noted.  Psych: Alert, appropriate and with normal affect.   LABORATORY DATA:  EKG:  EKG is ordered today. This demonstrates NSR with 1st degree AV block.  Lab Results  Component Value Date   WBC 5.3 07/10/2014   HGB 12.8* 07/10/2014   HCT 38.2* 07/10/2014   PLT 160 07/10/2014   GLUCOSE 226* 07/10/2014   ALT 12 04/30/2014   AST 13 04/30/2014   NA 134* 07/10/2014   K 3.5 07/10/2014   CL 93* 07/10/2014   CREATININE 6.94* 07/10/2014   BUN 29* 07/10/2014   CO2 28 07/10/2014   TSH 2.99 04/30/2014   INR 3.6 07/28/2014   HGBA1C 7.4* 01/10/2012    BNP (last 3 results) No results for input(s): BNP in the last 8760 hours.  ProBNP (last 3 results)  Recent Labs  11/05/13 1935  PROBNP 8040.0*   Lab Results  Component Value Date   INR 3.6 07/28/2014   INR 1.3 07/21/2014   INR 2.4 07/14/2014    Other Studies Reviewed Today:  Echo Study Conclusions from 2013  - Left ventricle: The cavity size was normal. There was mild concentric hypertrophy. Systolic function was normal. The estimated ejection fraction was in the range of 55% to 60%. Wall motion was normal; there were no regional wall motion  abnormalities. - Mitral valve: Mild regurgitation. - Right ventricle: The cavity size was mildly dilated. Wall thickness was normal. - Right atrium: The atrium was mildly dilated.  Assessment/Plan:  History of tachy brady - rate is ok today.  High risk medicine - on amiodarone  History of AF/flutter - he is in sinus today - he was in sinus back earlier this month when he presented to the ER. No indication for  cardioversion at this time.   ESRD (end stage renal disease): No complications  Essential hypertension: Controlled  Chronic coumadin - no problems identified.   Current medicines are reviewed with the patient today.  The patient does not have concerns regarding medicines other than what has been noted above.  The following changes have been made:  See above.  Labs/ tests ordered today include:   No orders of the defined types were placed in this encounter.     Disposition:   FU with Dr. Katrinka [name] in September as planned (recall OV)   Patient is agreeable to this plan and will call if any problems develop in the interim.   Signed: Rosalio Macadamia, RN, ANP-C 07/28/2014 9:48 AM  [facility] [address] [place], [place]  [address] Phone: [phone] Fax: [phone]

## 2014-07-28 NOTE — Patient Instructions (Addendum)
We will be checking the following labs today - NONE  Medication Instructions:    Continue with your current medicines.     Testing/Procedures To Be Arranged:  N/A  Follow-Up:   See Dr. Katrinka BlazingSmith as planned - for recall OV in September    Other Special Instructions:   N/A  Call the [facility] office at [phone] if you have any questions, problems or concerns.

## 2014-08-04 ENCOUNTER — Ambulatory Visit (INDEPENDENT_AMBULATORY_CARE_PROVIDER_SITE_OTHER): Payer: Medicare Other | Admitting: *Deleted

## 2014-08-04 DIAGNOSIS — Z5181 Encounter for therapeutic drug level monitoring: Secondary | ICD-10-CM | POA: Diagnosis not present

## 2014-08-04 DIAGNOSIS — I4891 Unspecified atrial fibrillation: Secondary | ICD-10-CM

## 2014-08-04 DIAGNOSIS — I4892 Unspecified atrial flutter: Secondary | ICD-10-CM

## 2014-08-04 LAB — POCT INR: INR: 3

## 2014-08-18 ENCOUNTER — Ambulatory Visit (INDEPENDENT_AMBULATORY_CARE_PROVIDER_SITE_OTHER): Payer: Medicare Other | Admitting: Surgery

## 2014-08-18 DIAGNOSIS — I4892 Unspecified atrial flutter: Secondary | ICD-10-CM | POA: Diagnosis not present

## 2014-08-18 DIAGNOSIS — I4891 Unspecified atrial fibrillation: Secondary | ICD-10-CM

## 2014-08-18 DIAGNOSIS — Z5181 Encounter for therapeutic drug level monitoring: Secondary | ICD-10-CM | POA: Diagnosis not present

## 2014-08-18 LAB — POCT INR: INR: 1.6

## 2014-08-19 LAB — PROTIME-INR: INR: 1.9 — AB (ref 0.9–1.1)

## 2014-08-21 ENCOUNTER — Encounter: Payer: Self-pay | Admitting: Interventional Cardiology

## 2014-08-22 ENCOUNTER — Other Ambulatory Visit: Payer: Self-pay | Admitting: Interventional Cardiology

## 2014-08-24 NOTE — Telephone Encounter (Signed)
Coumadin refill. Thank you for your time. 

## 2014-09-01 ENCOUNTER — Ambulatory Visit (INDEPENDENT_AMBULATORY_CARE_PROVIDER_SITE_OTHER): Payer: Medicare Other | Admitting: Pharmacist Clinician (PhC)/ Clinical Pharmacy Specialist

## 2014-09-01 DIAGNOSIS — Z5181 Encounter for therapeutic drug level monitoring: Secondary | ICD-10-CM

## 2014-09-01 DIAGNOSIS — I4891 Unspecified atrial fibrillation: Secondary | ICD-10-CM

## 2014-09-01 DIAGNOSIS — I4892 Unspecified atrial flutter: Secondary | ICD-10-CM | POA: Diagnosis not present

## 2014-09-01 LAB — POCT INR: INR: 1.7

## 2014-09-15 ENCOUNTER — Ambulatory Visit (INDEPENDENT_AMBULATORY_CARE_PROVIDER_SITE_OTHER): Payer: Medicare Other

## 2014-09-15 DIAGNOSIS — I4892 Unspecified atrial flutter: Secondary | ICD-10-CM

## 2014-09-15 DIAGNOSIS — I4891 Unspecified atrial fibrillation: Secondary | ICD-10-CM | POA: Diagnosis not present

## 2014-09-15 DIAGNOSIS — Z5181 Encounter for therapeutic drug level monitoring: Secondary | ICD-10-CM | POA: Diagnosis not present

## 2014-09-15 LAB — POCT INR: INR: 2

## 2014-09-15 MED ORDER — WARFARIN SODIUM 5 MG PO TABS
ORAL_TABLET | ORAL | Status: DC
Start: 2014-09-15 — End: 2014-10-29

## 2014-10-06 ENCOUNTER — Ambulatory Visit (INDEPENDENT_AMBULATORY_CARE_PROVIDER_SITE_OTHER): Payer: Medicare Other | Admitting: Pharmacist

## 2014-10-06 DIAGNOSIS — I4892 Unspecified atrial flutter: Secondary | ICD-10-CM

## 2014-10-06 DIAGNOSIS — Z5181 Encounter for therapeutic drug level monitoring: Secondary | ICD-10-CM | POA: Diagnosis not present

## 2014-10-06 DIAGNOSIS — I4891 Unspecified atrial fibrillation: Secondary | ICD-10-CM

## 2014-10-06 LAB — POCT INR: INR: 1.5

## 2014-10-21 LAB — PROTIME-INR: INR: 1.4 — AB (ref 0.9–1.1)

## 2014-10-23 ENCOUNTER — Ambulatory Visit (INDEPENDENT_AMBULATORY_CARE_PROVIDER_SITE_OTHER): Payer: Medicare Other | Admitting: Cardiology

## 2014-10-23 DIAGNOSIS — I4891 Unspecified atrial fibrillation: Secondary | ICD-10-CM

## 2014-10-23 DIAGNOSIS — I4892 Unspecified atrial flutter: Secondary | ICD-10-CM

## 2014-10-23 DIAGNOSIS — Z5181 Encounter for therapeutic drug level monitoring: Secondary | ICD-10-CM

## 2014-10-25 NOTE — Progress Notes (Signed)
Cardiology Office Note   Date:  10/29/2014   ID:  [name], DOB 1933-04-23, MRN [id number]  PCP:  Katy Apo, MD  Cardiologist:  Lesleigh Noe, MD   Chief Complaint  Patient presents with  . Atrial Flutter      History of Present Illness: [name] [name] is a 81 y.o. male who presents for chronic atrial flutter, amiodarone therapy, diastolic heart failure, severe hypertension, end-stage renal disease on chronic dialysis, chronic anticoagulation therapy, and diabetes mellitus. He is status post ablation for PSVT.  Tamon is here today. He and his wife have purchased an automobile  which makes it easier to make Coumadin clinic appointments. He has noted no bleeding, head trauma, or other medication side effects. He admits to occasional palpitations, but denies syncope, chest pain, and dyspnea.  There is an occasional cough but is not related to orthopnea.   Past Medical History  Diagnosis Date  . Renal disorder   . Diabetes mellitus without complication   . Atrial fibrillation   . Hypertension   . Glaucoma   . Peripheral neuropathy   . Gout   . Anemia     Past Surgical History  Procedure Laterality Date  . Ablation of dysrhythmic focus    . Sp av dialysis shunt access existing *l*       Current Outpatient Prescriptions  Medication Sig Dispense Refill  . amiodarone (PACERONE) 100 MG tablet Take 1 tablet (100 mg total) by mouth at bedtime. 30 tablet 11  . amLODipine (NORVASC) 10 MG tablet Take 10 mg by mouth daily.     Marland Kitchen aspirin EC 81 MG tablet Take 81 mg by mouth daily.    . benzonatate (TESSALON) 100 MG capsule Take by mouth 3 (three) times daily as needed for cough.    . Calcium Acetate 667 MG TABS Take 2 capsules by mouth 3 (three) times daily.    Marland Kitchen docusate sodium (COLACE) 100 MG capsule Take 100 mg by mouth 2 (two) times daily.    . dorzolamide-timolol (COSOPT) 22.3-6.8 MG/ML ophthalmic solution Place 2 drops into the right eye 2 (two) times  daily.    . folic acid-vitamin b complex-vitamin c-selenium-zinc (DIALYVITE) 3 MG TABS tablet Take 1 tablet by mouth daily.    Marland Kitchen glipiZIDE (GLUCOTROL) 10 MG tablet Take 10 mg by mouth 2 (two) times daily before a meal.    . latanoprost (XALATAN) 0.005 % ophthalmic solution Place 1 drop into the right eye at bedtime.    . lidocaine-prilocaine (EMLA) cream Apply 1 application topically as needed (apply to skin near dialysis port before dialysis).     Marland Kitchen ofloxacin (OCUFLOX) 0.3 % ophthalmic solution Place 1 drop into the right eye 2 (two) times daily.    . prednisoLONE acetate (PRED FORTE) 1 % ophthalmic suspension Place 1 drop into the left eye daily.    . SENSIPAR 60 MG tablet Take 60 mg by mouth daily.    . simvastatin (ZOCOR) 20 MG tablet Take 20 mg by mouth every evening.    . TRAVATAN Z 0.004 % SOLN ophthalmic solution Place 1 drop into the right eye daily.    . valsartan (DIOVAN) 160 MG tablet Take 160 mg by mouth daily.    Marland Kitchen warfarin (COUMADIN) 5 MG tablet Take as directed by coumadin clinic 50 tablet 3   No current facility-administered medications for this visit.    Allergies:   Diltiazem    Social History:  The patient  reports  that he has never smoked. He has never used smokeless tobacco. He reports that he does not drink alcohol or use illicit drugs.   Family History:  The patient's family history includes Alcoholism in his brother and brother; Heart failure in his father; Other in his mother.    ROS:  Please see the history of present illness.   Otherwise, review of systems are positive for cough, dyspnea on exertion, vision disturbance, and chronic dialysis without difficulty..   All other systems are reviewed and negative.    PHYSICAL EXAM: VS:  BP 134/64 mmHg  Pulse 63  Ht  (1.93 m)  Wt 106.142 kg (234 lb)  BMI 28.50 kg/m2 , BMI Body mass index is 28.5 kg/(m^2). GEN: Well nourished, well developed, in no acute distress HEENT: normal Neck: no JVD, carotid bruits,  or masses Cardiac: RRR.  There is no murmur, rub, or gallop. There is no edema. Respiratory:  clear to auscultation bilaterally, normal work of breathing. GI: soft, nontender, nondistended, + BS MS: no deformity or atrophy Skin: warm and dry, no rash Neuro:  Strength and sensation are intact Psych: euthymic mood, full affect   EKG:  EKG is not ordered today.    Recent Labs: 11/05/2013: Pro B Natriuretic peptide (BNP) 8040.0* 04/30/2014: ALT 12; TSH 2.99 07/10/2014: BUN 29*; Creatinine, Ser 6.94*; Hemoglobin 12.8*; Platelets 160; Potassium 3.5; Sodium 134*    Lipid Panel No results found for: CHOL, TRIG, HDL, CHOLHDL, VLDL, LDLCALC, LDLDIRECT    Wt Readings from Last 3 Encounters:  10/29/14 106.142 kg (234 lb)  07/28/14 104.781 kg (231 lb)  07/10/14 102.059 kg (225 lb)      Other studies Reviewed: Additional studies/ records that were reviewed today include: Reviewed encounters since the last office visit with me The findings include resumption of sinus rhythm on amiodarone. An episode of syncope in June lead to ER visit and the EKG performed at that time demonstrated resolution of atrial flutter to sinus rhythm..    ASSESSMENT AND PLAN:  1. Typical atrial flutter Reverted to normal sinus rhythm/bradycardia on amiodarone. We'll decrease amiodarone to 100 mg daily. Hopefully this will be adequate to maintain sinus rhythm. Suspect that episodes of syncope earlier this year may have been related to conversion from atrial flutter to sinus rhythm perhaps with pausing or profound bradycardia. He was noted to be in sinus rhythm in the ER after the syncopal episode.  2. Essential hypertension Controlled much better since initiation of dialysis  3. Sinus bradycardia-tachycardia syndrome Stable and will lower the dose of amiodarone to prevent excessive sinus bradycardia  4. On amiodarone therapy No obvious toxicity. We will lower the dose to average 100 mg daily.   5. ESRD (end  stage renal disease) Marked improvement in blood pressure control since initiation of dialysis.  6. Chronic anticoagulation Followed in Coumadin clinic.    Current medicines are reviewed at length with the patient today.  The patient has the following concerns regarding medicines: Wonders if he will have an INR performed today. Otherwise no concerns..  The following changes/actions have been instituted:    Decrease amiodarone to 100 mg daily  TSH and hepatic panel today.  Coumadin clinic today.   We'll need indefinite anticoagulation therapy  Labs/ tests ordered today include:   Orders Placed This Encounter  Procedures  . TSH  . Hepatic function panel     Disposition:   FU with HS in 6 months  Signed, Lesleigh Noe, MD  10/29/2014 10:58  AM    [facility] [place], [place], [place]  [address] Phone: [phone]; Fax: [phone]

## 2014-10-29 ENCOUNTER — Ambulatory Visit (INDEPENDENT_AMBULATORY_CARE_PROVIDER_SITE_OTHER): Payer: Medicare Other | Admitting: *Deleted

## 2014-10-29 ENCOUNTER — Telehealth: Payer: Self-pay

## 2014-10-29 ENCOUNTER — Encounter: Payer: Self-pay | Admitting: Interventional Cardiology

## 2014-10-29 ENCOUNTER — Ambulatory Visit (INDEPENDENT_AMBULATORY_CARE_PROVIDER_SITE_OTHER): Payer: Medicare Other | Admitting: Interventional Cardiology

## 2014-10-29 VITALS — BP 134/64 | HR 63 | Ht 76.0 in | Wt 234.0 lb

## 2014-10-29 DIAGNOSIS — I483 Typical atrial flutter: Secondary | ICD-10-CM

## 2014-10-29 DIAGNOSIS — Z5181 Encounter for therapeutic drug level monitoring: Secondary | ICD-10-CM

## 2014-10-29 DIAGNOSIS — Z79899 Other long term (current) drug therapy: Secondary | ICD-10-CM | POA: Diagnosis not present

## 2014-10-29 DIAGNOSIS — I4891 Unspecified atrial fibrillation: Secondary | ICD-10-CM | POA: Diagnosis not present

## 2014-10-29 DIAGNOSIS — I495 Sick sinus syndrome: Secondary | ICD-10-CM

## 2014-10-29 DIAGNOSIS — I481 Persistent atrial fibrillation: Secondary | ICD-10-CM

## 2014-10-29 DIAGNOSIS — I4892 Unspecified atrial flutter: Secondary | ICD-10-CM

## 2014-10-29 DIAGNOSIS — I10 Essential (primary) hypertension: Secondary | ICD-10-CM

## 2014-10-29 DIAGNOSIS — I4819 Other persistent atrial fibrillation: Secondary | ICD-10-CM

## 2014-10-29 DIAGNOSIS — N186 End stage renal disease: Secondary | ICD-10-CM

## 2014-10-29 LAB — HEPATIC FUNCTION PANEL
ALBUMIN: 4.5 g/dL (ref 3.5–5.2)
ALT: 18 U/L (ref 0–53)
AST: 18 U/L (ref 0–37)
Alkaline Phosphatase: 64 U/L (ref 39–117)
BILIRUBIN TOTAL: 0.5 mg/dL (ref 0.2–1.2)
Bilirubin, Direct: 0.1 mg/dL (ref 0.0–0.3)
Total Protein: 9 g/dL — ABNORMAL HIGH (ref 6.0–8.3)

## 2014-10-29 LAB — POCT INR: INR: 1.7

## 2014-10-29 LAB — TSH: TSH: 2.79 u[IU]/mL (ref 0.35–4.50)

## 2014-10-29 MED ORDER — WARFARIN SODIUM 5 MG PO TABS
ORAL_TABLET | ORAL | Status: DC
Start: 2014-10-29 — End: 2015-02-04

## 2014-10-29 MED ORDER — AMIODARONE HCL 100 MG PO TABS: 100.0000 mg | ORAL_TABLET | Freq: Every day | ORAL | Status: DC

## 2014-10-29 NOTE — Telephone Encounter (Signed)
Returned pt call x2 to tel # pt requested [phone].unable to lmom

## 2014-10-29 NOTE — Telephone Encounter (Signed)
Pt rtn call-wants to have you call him [phone] let it ring has trouble getting to the phone

## 2014-10-29 NOTE — Telephone Encounter (Signed)
Called to give pt lab results.lmtcb  

## 2014-10-29 NOTE — Telephone Encounter (Signed)
-----   Message from Lyn Records, MD sent at 10/29/2014  1:28 PM EDT ----- Labs are stable. Total protein is high

## 2014-10-29 NOTE — Patient Instructions (Signed)
Medication Instructions:  Your physician has recommended you make the following change in your medication:  REDUCE Amiodarone to  daily  Labwork: Tsh, Hepatic today  Testing/Procedures: None ordered  Follow-Up: Your physician wants you to follow-up in: 6 months with Dr.Smith You will receive a reminder letter in the mail two months in advance. If you don't receive a letter, please call our office to schedule the follow-up appointment.   Any Other Special Instructions Will Be Listed Below (If Applicable).

## 2014-10-30 ENCOUNTER — Telehealth: Payer: Self-pay

## 2014-10-30 NOTE — Telephone Encounter (Signed)
PA in progress. 

## 2014-10-30 NOTE — Telephone Encounter (Signed)
3rd attempt to reach pt. [name] [name], labs are stable, protein is high. Pt is to call back if any questions

## 2014-10-30 NOTE — Telephone Encounter (Signed)
Prior auth for Pacerone  sent to [employer].

## 2014-11-02 ENCOUNTER — Telehealth: Payer: Self-pay

## 2014-11-02 NOTE — Telephone Encounter (Signed)
Approval for Amiodarone  approved through 01/30/2015. [id number]. Pharmacy notified.

## 2014-11-12 ENCOUNTER — Ambulatory Visit (INDEPENDENT_AMBULATORY_CARE_PROVIDER_SITE_OTHER): Payer: Medicare Other

## 2014-11-12 DIAGNOSIS — I4819 Other persistent atrial fibrillation: Secondary | ICD-10-CM

## 2014-11-12 DIAGNOSIS — I4891 Unspecified atrial fibrillation: Secondary | ICD-10-CM

## 2014-11-12 DIAGNOSIS — Z5181 Encounter for therapeutic drug level monitoring: Secondary | ICD-10-CM

## 2014-11-12 DIAGNOSIS — I481 Persistent atrial fibrillation: Secondary | ICD-10-CM

## 2014-11-12 DIAGNOSIS — I4892 Unspecified atrial flutter: Secondary | ICD-10-CM

## 2014-11-12 DIAGNOSIS — I483 Typical atrial flutter: Secondary | ICD-10-CM

## 2014-11-12 LAB — POCT INR: INR: 1.4

## 2014-11-19 ENCOUNTER — Ambulatory Visit (INDEPENDENT_AMBULATORY_CARE_PROVIDER_SITE_OTHER): Payer: Medicare Other

## 2014-11-19 DIAGNOSIS — R001 Bradycardia, unspecified: Secondary | ICD-10-CM

## 2014-11-30 ENCOUNTER — Telehealth: Payer: Self-pay

## 2014-11-30 NOTE — Telephone Encounter (Signed)
Called to give pt holter results. Unable to lmom. Pt line busy

## 2014-11-30 NOTE — Telephone Encounter (Signed)
-----   Message from Henry W Smith, MD sent at 11/26/2014  6:53 PM EDT ----- Reassuring. No action needed. 

## 2014-12-03 ENCOUNTER — Ambulatory Visit: Payer: Medicare Other | Admitting: Vascular Surgery

## 2014-12-04 ENCOUNTER — Encounter: Payer: Self-pay | Admitting: Vascular Surgery

## 2014-12-08 ENCOUNTER — Ambulatory Visit (INDEPENDENT_AMBULATORY_CARE_PROVIDER_SITE_OTHER): Payer: Medicare Other | Admitting: *Deleted

## 2014-12-08 ENCOUNTER — Ambulatory Visit (INDEPENDENT_AMBULATORY_CARE_PROVIDER_SITE_OTHER): Payer: Medicare Other | Admitting: Vascular Surgery

## 2014-12-08 ENCOUNTER — Ambulatory Visit: Payer: Medicare Other | Admitting: Vascular Surgery

## 2014-12-08 ENCOUNTER — Encounter: Payer: Self-pay | Admitting: Vascular Surgery

## 2014-12-08 VITALS — BP 140/78 | HR 68 | Temp 97.4°F | Resp 16 | Ht 75.0 in | Wt 232.3 lb

## 2014-12-08 DIAGNOSIS — Z992 Dependence on renal dialysis: Secondary | ICD-10-CM | POA: Diagnosis not present

## 2014-12-08 DIAGNOSIS — I483 Typical atrial flutter: Secondary | ICD-10-CM

## 2014-12-08 DIAGNOSIS — I4892 Unspecified atrial flutter: Secondary | ICD-10-CM

## 2014-12-08 DIAGNOSIS — I481 Persistent atrial fibrillation: Secondary | ICD-10-CM

## 2014-12-08 DIAGNOSIS — I4891 Unspecified atrial fibrillation: Secondary | ICD-10-CM | POA: Diagnosis not present

## 2014-12-08 DIAGNOSIS — Z5181 Encounter for therapeutic drug level monitoring: Secondary | ICD-10-CM | POA: Diagnosis not present

## 2014-12-08 DIAGNOSIS — N186 End stage renal disease: Secondary | ICD-10-CM

## 2014-12-08 DIAGNOSIS — I4819 Other persistent atrial fibrillation: Secondary | ICD-10-CM

## 2014-12-08 LAB — POCT INR: INR: 2.1

## 2014-12-08 NOTE — Progress Notes (Signed)
[facility]  Patient name: [name] [name] MRN: [id number] DOB: [birth year] Sex: male  REASON FOR VISIT: Wound issues regarding left upper arm AV fistula  HPI: [name] [name] is a 81 y.o. male resents today for evaluation of left upper arm AV fistula. This is been present for many years. At least 5 or 6 years and perhaps longer. It appears that his most recent intervention at [facility] was in June. He has several areas of superficial ulceration over the wound and apparently had some purulent drainage from this and is on vancomycin. He is seen today to determine if there is a risk for rupture.  Past Medical History  Diagnosis Date  . Renal disorder   . Diabetes mellitus without complication (HCC)   . Atrial fibrillation (HCC)   . Hypertension   . Glaucoma   . Peripheral neuropathy (HCC)   . Gout   . Anemia     Family History  Problem Relation Age of Onset  . Alcoholism Brother   . Alcoholism Brother   . Other Mother     bowel obstruction  . Heart failure Father     fluid bluid up    SOCIAL HISTORY: Social History  Substance Use Topics  . Smoking status: Never Smoker   . Smokeless tobacco: Never Used  . Alcohol Use: No    Allergies  Allergen Reactions  . Diltiazem Other (See Comments)    SEVERE STOMACH CRAMPING PER PT    Current Outpatient Prescriptions  Medication Sig Dispense Refill  . amiodarone (PACERONE) 100 MG tablet Take 1 tablet (100 mg total) by mouth at bedtime. 30 tablet 11  . amLODipine (NORVASC) 10 MG tablet Take 10 mg by mouth daily.     Marland Kitchen. aspirin EC 81 MG tablet Take 81 mg by mouth daily.    . benzonatate (TESSALON) 100 MG capsule Take by mouth 3 (three) times daily as needed for cough.    . Calcium Acetate 667 MG TABS Take 2 capsules by mouth 3 (three) times daily.    Marland Kitchen. docusate sodium (COLACE) 100 MG capsule Take 100 mg by mouth 2 (two) times daily.    . dorzolamide-timolol (COSOPT) 22.3-6.8 MG/ML ophthalmic  solution Place 2 drops into the right eye 2 (two) times daily.    . folic acid-vitamin b complex-vitamin c-selenium-zinc (DIALYVITE) 3 MG TABS tablet Take 1 tablet by mouth daily.    Marland Kitchen. glipiZIDE (GLUCOTROL) 10 MG tablet Take 10 mg by mouth 2 (two) times daily before a meal.    . latanoprost (XALATAN) 0.005 % ophthalmic solution Place 1 drop into the right eye at bedtime.    . lidocaine-prilocaine (EMLA) cream Apply 1 application topically as needed (apply to skin near dialysis port before dialysis).     Marland Kitchen. ofloxacin (OCUFLOX) 0.3 % ophthalmic solution Place 1 drop into the right eye 2 (two) times daily.    . prednisoLONE acetate (PRED FORTE) 1 % ophthalmic suspension Place 1 drop into the left eye daily.    . SENSIPAR 60 MG tablet Take 60 mg by mouth daily.    . simvastatin (ZOCOR) 20 MG tablet Take 20 mg by mouth every evening.    . TRAVATAN Z 0.004 % SOLN ophthalmic solution Place 1 drop into the right eye daily.    . valsartan (DIOVAN) 160 MG tablet Take 160 mg by mouth daily.    Marland Kitchen. warfarin (COUMADIN) 5 MG tablet Take as directed by coumadin clinic 50 tablet 3  No current facility-administered medications for this visit.    REVIEW OF SYSTEMS:   denotes positive finding,  denotes negative finding Cardiac  Comments:  Chest pain or chest pressure:    Shortness of breath upon exertion:    Short of breath when lying flat:    Irregular heart rhythm:        Vascular    Pain in calf, thigh, or hip brought on by ambulation:    Pain in feet at night that wakes you up from your sleep:     Blood clot in your veins:    Leg swelling:         Pulmonary    Oxygen at home:    Productive cough:     Wheezing:         Neurologic    Sudden weakness in arms or legs:     Sudden numbness in arms or legs:     Sudden onset of difficulty speaking or slurred speech:    Temporary loss of vision in one eye:     Problems with dizziness:         Gastrointestinal    Blood in stool:     Vomited  blood:         Genitourinary    Burning when urinating:     Blood in urine:        Psychiatric    Major depression:         Hematologic    Bleeding problems:    Problems with blood clotting too easily:        Skin    Rashes or ulcers:        Constitutional    Fever or chills:      PHYSICAL EXAM: Filed Vitals:   12/08/14 1036 12/08/14 1039  BP: 145/77 140/78  Pulse: 68   Temp: 97.4 F (36.3 C)   TempSrc: Oral   Resp: 16   Height:  (1.905 m)   Weight: 232 lb 4.8 oz (105.371 kg)   SpO2: 99%     He is alert oriented gentleman in no acute distress Respirations are equal nonlabored He does have a left upper arm AV fistula. This is been present for quite some time and does have some aneurysmal change over this. He has a great deal of keloid formation over the puncture sites. He has an excellent thrill through this. There are areas at the upper and lower. Access with what appears to be very superficial eschar present. Skin is not tented down to the fistula itself.   MEDICAL ISSUES: Had long discussion with the patient. I do not see any evidence for concern regarding potential rupture of this. I did explain signs look for with more typical thick eschar with some separation around it. I he knows to assess the nursing access facility stay away from these areas until they're completely healed. He was reassured with this discussion will see [place] again on as-needed basis No Follow-up on file.   Gretta Began [facility] [place]: [phone]

## 2014-12-08 NOTE — Progress Notes (Signed)
Filed Vitals:   12/08/14 1036 12/08/14 1039  BP: 145/77 140/78  Pulse: 68   Temp: 97.4 F (36.3 C)   TempSrc: Oral   Resp: 16   Height: 6\' 3"  (1.905 m)   Weight: 232 lb 4.8 oz (105.371 kg)   SpO2: 99%

## 2014-12-10 ENCOUNTER — Encounter: Payer: Self-pay | Admitting: Nephrology

## 2014-12-10 NOTE — Telephone Encounter (Signed)
2nd attempt to give pt holter monitor results. Unable to lmom. Pt phone rings out

## 2014-12-10 NOTE — Telephone Encounter (Signed)
-----   Message from Lyn RecordsHenry W Smith, MD sent at 11/26/2014  6:53 PM EDT ----- Reassuring. No action needed.

## 2014-12-15 ENCOUNTER — Other Ambulatory Visit: Payer: Self-pay | Admitting: Interventional Cardiology

## 2014-12-17 ENCOUNTER — Telehealth: Payer: Self-pay | Admitting: Interventional Cardiology

## 2014-12-17 ENCOUNTER — Other Ambulatory Visit: Payer: Self-pay | Admitting: Interventional Cardiology

## 2014-12-17 NOTE — Telephone Encounter (Signed)
Returned pt's call informing him that he has refills left on his medication Amiodarone 100 mg, already sent to [place] pharmacy [facility] [place], on 10/29/14, with 11 refills and that he needed to call them to request a refill. I advise the pt that if he has any other problems, questions or concerns to call the office. Pt verbalized understanding.

## 2014-12-29 ENCOUNTER — Ambulatory Visit (INDEPENDENT_AMBULATORY_CARE_PROVIDER_SITE_OTHER): Payer: Medicare Other | Admitting: *Deleted

## 2014-12-29 DIAGNOSIS — I481 Persistent atrial fibrillation: Secondary | ICD-10-CM | POA: Diagnosis not present

## 2014-12-29 DIAGNOSIS — I4891 Unspecified atrial fibrillation: Secondary | ICD-10-CM

## 2014-12-29 DIAGNOSIS — I4892 Unspecified atrial flutter: Secondary | ICD-10-CM | POA: Diagnosis not present

## 2014-12-29 DIAGNOSIS — I483 Typical atrial flutter: Secondary | ICD-10-CM

## 2014-12-29 DIAGNOSIS — Z5181 Encounter for therapeutic drug level monitoring: Secondary | ICD-10-CM | POA: Diagnosis not present

## 2014-12-29 DIAGNOSIS — I4819 Other persistent atrial fibrillation: Secondary | ICD-10-CM

## 2014-12-29 LAB — POCT INR: INR: 3.2

## 2015-01-19 ENCOUNTER — Ambulatory Visit (INDEPENDENT_AMBULATORY_CARE_PROVIDER_SITE_OTHER): Payer: Medicare Other | Admitting: *Deleted

## 2015-01-19 DIAGNOSIS — I481 Persistent atrial fibrillation: Secondary | ICD-10-CM | POA: Diagnosis not present

## 2015-01-19 DIAGNOSIS — I4891 Unspecified atrial fibrillation: Secondary | ICD-10-CM | POA: Diagnosis not present

## 2015-01-19 DIAGNOSIS — I483 Typical atrial flutter: Secondary | ICD-10-CM

## 2015-01-19 DIAGNOSIS — Z5181 Encounter for therapeutic drug level monitoring: Secondary | ICD-10-CM | POA: Diagnosis not present

## 2015-01-19 DIAGNOSIS — I4892 Unspecified atrial flutter: Secondary | ICD-10-CM

## 2015-01-19 DIAGNOSIS — I4819 Other persistent atrial fibrillation: Secondary | ICD-10-CM

## 2015-01-19 LAB — POCT INR: INR: 2

## 2015-02-04 ENCOUNTER — Other Ambulatory Visit: Payer: Self-pay | Admitting: *Deleted

## 2015-02-04 ENCOUNTER — Other Ambulatory Visit: Payer: Self-pay | Admitting: Interventional Cardiology

## 2015-02-04 MED ORDER — WARFARIN SODIUM 5 MG PO TABS: ORAL_TABLET | ORAL | Status: DC

## 2015-02-04 NOTE — Telephone Encounter (Signed)
Warfarin refill request filled as requested

## 2015-02-04 NOTE — Telephone Encounter (Signed)
New message      *STAT* If patient is at the pharmacy, call can be transferred to refill team.   1. Which medications need to be refilled? (please list name of each medication and dose if known) warfarin  5 mg   2. Which pharmacy/location (including street and city if local pharmacy) is medication to be sent to? [facility] in [place]   3. Do they need a 30 day or 90 day supply? 30 days

## 2015-02-23 ENCOUNTER — Ambulatory Visit (INDEPENDENT_AMBULATORY_CARE_PROVIDER_SITE_OTHER): Payer: Medicare Other | Admitting: *Deleted

## 2015-02-23 DIAGNOSIS — I481 Persistent atrial fibrillation: Secondary | ICD-10-CM

## 2015-02-23 DIAGNOSIS — Z5181 Encounter for therapeutic drug level monitoring: Secondary | ICD-10-CM

## 2015-02-23 DIAGNOSIS — I483 Typical atrial flutter: Secondary | ICD-10-CM

## 2015-02-23 DIAGNOSIS — I4892 Unspecified atrial flutter: Secondary | ICD-10-CM | POA: Diagnosis not present

## 2015-02-23 DIAGNOSIS — I4819 Other persistent atrial fibrillation: Secondary | ICD-10-CM

## 2015-02-23 DIAGNOSIS — I4891 Unspecified atrial fibrillation: Secondary | ICD-10-CM | POA: Diagnosis not present

## 2015-02-23 LAB — POCT INR: INR: 2

## 2015-03-11 ENCOUNTER — Telehealth: Payer: Self-pay | Admitting: Interventional Cardiology

## 2015-03-11 MED ORDER — WARFARIN SODIUM 5 MG PO TABS: ORAL_TABLET | ORAL | Status: DC

## 2015-03-11 NOTE — Telephone Encounter (Signed)
New message       *STAT* If patient is at the pharmacy, call can be transferred to refill team.   1. Which medications need to be refilled? (please list name of each medication and dose if known) warfarin   2. Which pharmacy/location (including street and city if local pharmacy) is medication to be sent to?* [facility] in [place] 3. Do they need a 30 day or 90 day supply? 30 day supply ----pt lost his bottle somewhere in his house.  He has 2 pill left.  Ins will not pay because it is not time to get it refilled---he will have to pay for it himself----

## 2015-03-23 ENCOUNTER — Ambulatory Visit (INDEPENDENT_AMBULATORY_CARE_PROVIDER_SITE_OTHER): Payer: Medicare Other | Admitting: *Deleted

## 2015-03-23 DIAGNOSIS — I481 Persistent atrial fibrillation: Secondary | ICD-10-CM | POA: Diagnosis not present

## 2015-03-23 DIAGNOSIS — Z5181 Encounter for therapeutic drug level monitoring: Secondary | ICD-10-CM

## 2015-03-23 DIAGNOSIS — I483 Typical atrial flutter: Secondary | ICD-10-CM

## 2015-03-23 DIAGNOSIS — I4892 Unspecified atrial flutter: Secondary | ICD-10-CM

## 2015-03-23 DIAGNOSIS — I4891 Unspecified atrial fibrillation: Secondary | ICD-10-CM

## 2015-03-23 DIAGNOSIS — I4819 Other persistent atrial fibrillation: Secondary | ICD-10-CM

## 2015-03-23 LAB — PROTIME-INR
INR: 5.05 (ref 0.00–1.49)
PROTHROMBIN TIME: 45.5 s — AB (ref 11.6–15.2)

## 2015-03-23 LAB — POCT INR: INR: 6.8

## 2015-03-30 ENCOUNTER — Ambulatory Visit (INDEPENDENT_AMBULATORY_CARE_PROVIDER_SITE_OTHER): Payer: Medicare Other | Admitting: *Deleted

## 2015-03-30 DIAGNOSIS — I4819 Other persistent atrial fibrillation: Secondary | ICD-10-CM

## 2015-03-30 DIAGNOSIS — I481 Persistent atrial fibrillation: Secondary | ICD-10-CM

## 2015-03-30 DIAGNOSIS — I4892 Unspecified atrial flutter: Secondary | ICD-10-CM | POA: Diagnosis not present

## 2015-03-30 DIAGNOSIS — Z5181 Encounter for therapeutic drug level monitoring: Secondary | ICD-10-CM

## 2015-03-30 DIAGNOSIS — I4891 Unspecified atrial fibrillation: Secondary | ICD-10-CM | POA: Diagnosis not present

## 2015-03-30 DIAGNOSIS — I483 Typical atrial flutter: Secondary | ICD-10-CM

## 2015-03-30 LAB — POCT INR: INR: 2.4

## 2015-04-01 ENCOUNTER — Emergency Department (HOSPITAL_BASED_OUTPATIENT_CLINIC_OR_DEPARTMENT_OTHER)
Admission: EM | Admit: 2015-04-01 | Discharge: 2015-04-01 | Disposition: A | Discharge status: Home / Self Care | Payer: Medicare Other | Attending: Emergency Medicine | Admitting: Emergency Medicine

## 2015-04-01 ENCOUNTER — Emergency Department (HOSPITAL_BASED_OUTPATIENT_CLINIC_OR_DEPARTMENT_OTHER): Payer: Medicare Other

## 2015-04-01 ENCOUNTER — Encounter (HOSPITAL_BASED_OUTPATIENT_CLINIC_OR_DEPARTMENT_OTHER): Payer: Self-pay | Admitting: Emergency Medicine

## 2015-04-01 DIAGNOSIS — I10 Essential (primary) hypertension: Secondary | ICD-10-CM | POA: Insufficient documentation

## 2015-04-01 DIAGNOSIS — Y998 Other external cause status: Secondary | ICD-10-CM | POA: Insufficient documentation

## 2015-04-01 DIAGNOSIS — Z87448 Personal history of other diseases of urinary system: Secondary | ICD-10-CM | POA: Insufficient documentation

## 2015-04-01 DIAGNOSIS — S0993XA Unspecified injury of face, initial encounter: Secondary | ICD-10-CM | POA: Diagnosis not present

## 2015-04-01 DIAGNOSIS — Y9389 Activity, other specified: Secondary | ICD-10-CM | POA: Diagnosis not present

## 2015-04-01 DIAGNOSIS — H409 Unspecified glaucoma: Secondary | ICD-10-CM | POA: Insufficient documentation

## 2015-04-01 DIAGNOSIS — Z7952 Long term (current) use of systemic steroids: Secondary | ICD-10-CM | POA: Insufficient documentation

## 2015-04-01 DIAGNOSIS — Y9289 Other specified places as the place of occurrence of the external cause: Secondary | ICD-10-CM | POA: Diagnosis not present

## 2015-04-01 DIAGNOSIS — Z7901 Long term (current) use of anticoagulants: Secondary | ICD-10-CM | POA: Diagnosis not present

## 2015-04-01 DIAGNOSIS — W06XXXA Fall from bed, initial encounter: Secondary | ICD-10-CM | POA: Diagnosis not present

## 2015-04-01 DIAGNOSIS — Z79899 Other long term (current) drug therapy: Secondary | ICD-10-CM | POA: Insufficient documentation

## 2015-04-01 DIAGNOSIS — S0990XA Unspecified injury of head, initial encounter: Secondary | ICD-10-CM | POA: Diagnosis not present

## 2015-04-01 DIAGNOSIS — Z7982 Long term (current) use of aspirin: Secondary | ICD-10-CM | POA: Insufficient documentation

## 2015-04-01 DIAGNOSIS — W19XXXA Unspecified fall, initial encounter: Secondary | ICD-10-CM

## 2015-04-01 DIAGNOSIS — I4891 Unspecified atrial fibrillation: Secondary | ICD-10-CM | POA: Diagnosis not present

## 2015-04-01 DIAGNOSIS — E119 Type 2 diabetes mellitus without complications: Secondary | ICD-10-CM | POA: Diagnosis not present

## 2015-04-01 DIAGNOSIS — R519 Headache, unspecified: Secondary | ICD-10-CM

## 2015-04-01 DIAGNOSIS — D649 Anemia, unspecified: Secondary | ICD-10-CM | POA: Diagnosis not present

## 2015-04-01 DIAGNOSIS — R51 Headache: Secondary | ICD-10-CM

## 2015-04-01 DIAGNOSIS — Z8739 Personal history of other diseases of the musculoskeletal system and connective tissue: Secondary | ICD-10-CM | POA: Diagnosis not present

## 2015-04-01 NOTE — Discharge Instructions (Signed)
Your CT does not show head bleed or any other serious head injury. People who were on Coumadin can have delayed bleeding but it is very rare. If he find that you're having any worsening symptoms including worsening headache, vomiting and unable to keep down food or fluids, confusion, numbness or weakness, or any other symptoms concerning to you please return for reevaluation. Otherwise, continue to take Tylenol as needed for your pain.  General Headache Without Cause A headache is pain or discomfort felt around the head or neck area. There are many causes and types of headaches. In some cases, the cause may not be found.  HOME CARE  Managing Pain  Take over-the-counter and prescription medicines only as told by your doctor.  Lie down in a dark, quiet room when you have a headache.  If directed, apply ice to the head and neck area:  Put ice in a plastic bag.  Place a towel between your skin and the bag.  Leave the ice on for 20 minutes, 2-3 times per day.  Use a heating pad or hot shower to apply heat to the head and neck area as told by your doctor.  Keep lights dim if bright lights bother you or make your headaches worse. Eating and Drinking  Eat meals on a regular schedule.  Lessen how much alcohol you drink.  Lessen how much caffeine you drink, or stop drinking caffeine. General Instructions  Keep all follow-up visits as told by your doctor. This is important.  Keep a journal to find out if certain things bring on headaches. For example, write down:  What you eat and drink.  How much sleep you get.  Any change to your diet or medicines.  Relax by getting a massage or doing other relaxing activities.  Lessen stress.  Sit up straight. Do not tighten (tense) your muscles.  Do not use tobacco products. This includes cigarettes, chewing tobacco, or e-cigarettes. If you need help quitting, ask your doctor.  Exercise regularly as told by your doctor.  Get enough sleep.  This often means 7-9 hours of sleep. GET HELP IF:  Your symptoms are not helped by medicine.  You have a headache that feels different than the other headaches.  You feel sick to your stomach (nauseous) or you throw up (vomit).  You have a fever. GET HELP RIGHT AWAY IF:   Your headache becomes really bad.  You keep throwing up.  You have a stiff neck.  You have trouble seeing.  You have trouble speaking.  You have pain in the eye or ear.  Your muscles are weak or you lose muscle control.  You lose your balance or have trouble walking.  You feel like you will pass out (faint) or you pass out.  You have confusion.   This information is not intended to replace advice given to you by your health care provider. Make sure you discuss any questions you have with your health care provider.   Document Released: 10/26/2007 Document Revised: 10/07/2014 Document Reviewed: 05/11/2014 Elsevier Interactive Patient Education [employer]

## 2015-04-01 NOTE — ED Notes (Addendum)
Patient states that he was reaching for something tues night and lost his balance falling out of bed. The patient reports that he is having a Headache since. Unsure if he hit his head when he fell

## 2015-04-01 NOTE — ED Provider Notes (Signed)
CSN: [id number]     Arrival date & time 04/01/15  1242 History   First MD Initiated Contact with Patient 04/01/15 1333     Chief Complaint  Patient presents with  . Fall     (Consider location/radiation/quality/duration/timing/severity/associated sxs/prior Treatment) HPI  82 year old male who presents after fall with headache. History of ESRD on HD, AFib on coumadin, DM, HTN, and HLD. Had a mechanical fall two days ago, reaching to get something from out of the bed and fell on the ground from bed. No syncope or loss of consciousness. Since then, started having headaches. Pain over forehead, comes and goes, improved with tylenol. No nausea or vomiting, no new vision or speech changes, no focal numbness or weakness, neck pain. Currently states that headache is resolved.  Past Medical History  Diagnosis Date  . Renal disorder   . Diabetes mellitus without complication (HCC)   . Atrial fibrillation (HCC)   . Hypertension   . Glaucoma   . Peripheral neuropathy (HCC)   . Gout   . Anemia    Past Surgical History  Procedure Laterality Date  . Ablation of dysrhythmic focus    . Sp av dialysis shunt access existing *l*     Family History  Problem Relation Age of Onset  . Alcoholism Brother   . Alcoholism Brother   . Other Mother     bowel obstruction  . Heart failure Father     fluid bluid up   Social History  Substance Use Topics  . Smoking status: Never Smoker   . Smokeless tobacco: Never Used  . Alcohol Use: No    Review of Systems 10/14 systems reviewed and are negative other than those stated in the HPI    Allergies  Diltiazem  Home Medications   Prior to Admission medications   Medication Sig Start Date End Date Taking? Authorizing Provider  amiodarone (PACERONE) 100 MG tablet Take 1 tablet (100 mg total) by mouth at bedtime. 10/29/14   Lyn Records, MD  amLODipine (NORVASC) 10 MG tablet Take 10 mg by mouth daily.  01/09/14   Historical Provider, MD  aspirin EC  81 MG tablet Take 81 mg by mouth daily.    Historical Provider, MD  atropine 1 % ophthalmic solution Place 1 drop into the left eye 2 times daily at 12 noon and 4 pm.    Historical Provider, MD  benzonatate (TESSALON) 100 MG capsule Take by mouth 3 (three) times daily as needed for cough.    Historical Provider, MD  Calcium Acetate 667 MG TABS Take 2 capsules by mouth 3 (three) times daily.    Historical Provider, MD  docusate sodium (COLACE) 100 MG capsule Take 100 mg by mouth 2 (two) times daily.    Historical Provider, MD  dorzolamide-timolol (COSOPT) 22.3-6.8 MG/ML ophthalmic solution Place 2 drops into the right eye 2 (two) times daily.    Historical Provider, MD  folic acid-vitamin b complex-vitamin c-selenium-zinc (DIALYVITE) 3 MG TABS tablet Take 1 tablet by mouth daily.    Historical Provider, MD  glipiZIDE (GLUCOTROL) 10 MG tablet Take 10 mg by mouth 2 (two) times daily before a meal.    Historical Provider, MD  lidocaine-prilocaine (EMLA) cream Apply 1 application topically as needed (apply to skin near dialysis port before dialysis).  08/31/14   Historical Provider, MD  prednisoLONE acetate (PRED FORTE) 1 % ophthalmic suspension Place 1 drop into the left eye daily.    Historical Provider, MD  [facility]  60 MG tablet Take 60 mg by mouth daily. 09/03/14   Historical Provider, MD  simvastatin (ZOCOR) 20 MG tablet Take 20 mg by mouth every evening.    Historical Provider, MD  tobramycin-dexamethasone [facility]) ophthalmic solution Place 1 drop into the left eye QID.    Historical Provider, MD  TRAVATAN Z 0.004 % SOLN ophthalmic solution Place 1 drop into the right eye daily. 08/30/14   Historical Provider, MD  valsartan (DIOVAN) 160 MG tablet Take 160 mg by mouth daily.    Historical Provider, MD  warfarin (COUMADIN) 5 MG tablet Take as directed by coumadin clinic 03/11/15   Lyn Records, MD   BP 164/75 mmHg  Pulse 72  Temp(Src) 98.1 F (36.7 C) (Oral)  Resp 18  Ht  (1.93 m)  Wt 225 lb  (102.059 kg)  BMI 27.40 kg/m2  SpO2 99% Physical Exam Physical Exam  Nursing note and vitals reviewed. Constitutional: Well developed, well nourished, non-toxic, and in no acute distress Head: Normocephalic and atraumatic.  Mouth/Throat: Oropharynx is clear and moist.  Neck: Normal range of motion. Neck supple. No cervical spine tenderness Cardiovascular: Normal rate and regular rhythm.   Pulmonary/Chest: Effort normal and breath sounds normal. No chest wall tenderness Abdominal: Soft. There is no tenderness. There is no rebound and no guarding.  Musculoskeletal: Normal range of motion.  Skin: Skin is warm and dry.  Psychiatric: Cooperative Neurological:  Alert, oriented to person, place, time, and situation. Memory grossly in tact. Fluent speech. No dysarthria or aphasia.  Cranial nerves: VF are full. Fundoscopic exam-unable to get good visualization of the discs. Pupils are symmetric, and reactive to light. EOMI without nystagmus. No gaze deviation. Facial muscles symmetric with activation. Sensation to light touch over face in tact bilaterally. Hearing grossly in tact. Palate elevates symmetrically. Head turn and shoulder shrug are intact. Tongue midline.   Muscle bulk and tone normal. No pronator drift. Moves all extremities symmetrically. Sensation to light touch is in tact throughout in bilateral upper and lower extremities. Coordination reveals no dysmetria with finger to nose. Gait is narrow-based and steady. Non-ataxic.   ED Course  Procedures (including critical care time) Labs Review Labs Reviewed - No data to display  Imaging Review Ct Head Wo Contrast  04/01/2015  CLINICAL DATA:  Posttraumatic headache after falling out of bed 2 days ago. No reported loss of consciousness. EXAM: CT HEAD WITHOUT CONTRAST CT CERVICAL SPINE WITHOUT CONTRAST TECHNIQUE: Multidetector CT imaging of the head and cervical spine was performed following the standard protocol without intravenous  contrast. Multiplanar CT image reconstructions of the cervical spine were also generated. COMPARISON:  CT scan of head of July 10, 2014. FINDINGS: CT HEAD FINDINGS Bony calvarium appears intact. Mild diffuse cortical atrophy is noted. Mild chronic ischemic white matter disease is noted. No mass effect or midline shift is noted. Ventricular size is within normal limits. There is no evidence of mass lesion, hemorrhage or acute infarction. CT CERVICAL SPINE FINDINGS Reversal of normal lordosis is noted due to degenerative disc disease. Severe degenerative disc disease is noted at C4-5, C5-6 and C6-7 with anterior osteophyte formation. No fracture or spondylolisthesis is noted. Visualized upper lung zones appear normal. IMPRESSION: Mild diffuse cortical atrophy. Mild chronic ischemic white matter disease. No acute intracranial abnormality seen. Severe multilevel degenerative disc disease. No acute abnormality seen in the cervical spine. Electronically Signed   By: Lupita Raider, M.D.   On: 04/01/2015 14:05   Ct Cervical Spine Wo Contrast  04/01/2015  CLINICAL DATA:  Posttraumatic headache after falling out of bed 2 days ago. No reported loss of consciousness. EXAM: CT HEAD WITHOUT CONTRAST CT CERVICAL SPINE WITHOUT CONTRAST TECHNIQUE: Multidetector CT imaging of the head and cervical spine was performed following the standard protocol without intravenous contrast. Multiplanar CT image reconstructions of the cervical spine were also generated. COMPARISON:  CT scan of head of July 10, 2014. FINDINGS: CT HEAD FINDINGS Bony calvarium appears intact. Mild diffuse cortical atrophy is noted. Mild chronic ischemic white matter disease is noted. No mass effect or midline shift is noted. Ventricular size is within normal limits. There is no evidence of mass lesion, hemorrhage or acute infarction. CT CERVICAL SPINE FINDINGS Reversal of normal lordosis is noted due to degenerative disc disease. Severe degenerative disc disease  is noted at C4-5, C5-6 and C6-7 with anterior osteophyte formation. No fracture or spondylolisthesis is noted. Visualized upper lung zones appear normal. IMPRESSION: Mild diffuse cortical atrophy. Mild chronic ischemic white matter disease. No acute intracranial abnormality seen. Severe multilevel degenerative disc disease. No acute abnormality seen in the cervical spine. Electronically Signed   By: Lupita Raider, M.D.   On: 04/01/2015 14:05   I have personally reviewed and evaluated these images and lab results as part of my medical decision-making.   EKG Interpretation None      MDM   Final diagnoses:  Fall, initial encounter  Nonintractable headache, unspecified chronicity pattern, unspecified headache type    82 year old male with history of atrial fibrillation on Coumadin who presents with intermittent headache since mechanical fall 2 days ago. Is well-appearing and in no acute distress on presentation. Has a normal neurological exam. Has not concerning vital signs. States that he feels like his normal self currently. CT head and cervical spine are negative for acute injuries. No evidence of intracranial bleeding. Discussed continued supportive care with Tylenol as needed for headaches at home. Strict return instructions are also reviewed. He expressed understanding of all discharge instructions and felt comfortable to plan of care.    Lavera Guise, MD 04/01/15 [phone]

## 2015-04-01 NOTE — ED Notes (Signed)
Patient transported to CT 

## 2015-04-01 NOTE — ED Notes (Signed)
Returned from CT.

## 2015-04-04 ENCOUNTER — Emergency Department (HOSPITAL_COMMUNITY): Payer: Medicare Other

## 2015-04-04 ENCOUNTER — Emergency Department (HOSPITAL_COMMUNITY)
Admission: EM | Admit: 2015-04-04 | Discharge: 2015-04-04 | Disposition: A | Discharge status: Home / Self Care | Payer: Medicare Other | Attending: Emergency Medicine | Admitting: Emergency Medicine

## 2015-04-04 ENCOUNTER — Encounter (HOSPITAL_COMMUNITY): Payer: Self-pay | Admitting: Family Medicine

## 2015-04-04 DIAGNOSIS — Z79899 Other long term (current) drug therapy: Secondary | ICD-10-CM | POA: Diagnosis not present

## 2015-04-04 DIAGNOSIS — Z8739 Personal history of other diseases of the musculoskeletal system and connective tissue: Secondary | ICD-10-CM | POA: Diagnosis not present

## 2015-04-04 DIAGNOSIS — D649 Anemia, unspecified: Secondary | ICD-10-CM | POA: Diagnosis not present

## 2015-04-04 DIAGNOSIS — E119 Type 2 diabetes mellitus without complications: Secondary | ICD-10-CM | POA: Diagnosis not present

## 2015-04-04 DIAGNOSIS — Z7982 Long term (current) use of aspirin: Secondary | ICD-10-CM | POA: Diagnosis not present

## 2015-04-04 DIAGNOSIS — Z87828 Personal history of other (healed) physical injury and trauma: Secondary | ICD-10-CM | POA: Diagnosis not present

## 2015-04-04 DIAGNOSIS — R51 Headache: Secondary | ICD-10-CM | POA: Insufficient documentation

## 2015-04-04 DIAGNOSIS — R011 Cardiac murmur, unspecified: Secondary | ICD-10-CM | POA: Diagnosis not present

## 2015-04-04 DIAGNOSIS — I4891 Unspecified atrial fibrillation: Secondary | ICD-10-CM | POA: Diagnosis not present

## 2015-04-04 DIAGNOSIS — N186 End stage renal disease: Secondary | ICD-10-CM | POA: Diagnosis not present

## 2015-04-04 DIAGNOSIS — I120 Hypertensive chronic kidney disease with stage 5 chronic kidney disease or end stage renal disease: Secondary | ICD-10-CM | POA: Diagnosis not present

## 2015-04-04 DIAGNOSIS — H409 Unspecified glaucoma: Secondary | ICD-10-CM | POA: Diagnosis not present

## 2015-04-04 DIAGNOSIS — H578 Other specified disorders of eye and adnexa: Secondary | ICD-10-CM | POA: Insufficient documentation

## 2015-04-04 DIAGNOSIS — R519 Headache, unspecified: Secondary | ICD-10-CM

## 2015-04-04 DIAGNOSIS — Z7901 Long term (current) use of anticoagulants: Secondary | ICD-10-CM | POA: Diagnosis not present

## 2015-04-04 LAB — CBC WITH DIFFERENTIAL/PLATELET
BASOS ABS: 0 10*3/uL (ref 0.0–0.1)
BASOS PCT: 0 %
EOS PCT: 3 %
Eosinophils Absolute: 0.3 10*3/uL (ref 0.0–0.7)
HCT: 32.4 % — ABNORMAL LOW (ref 39.0–52.0)
Hemoglobin: 11 g/dL — ABNORMAL LOW (ref 13.0–17.0)
LYMPHS PCT: 17 %
Lymphs Abs: 1.3 10*3/uL (ref 0.7–4.0)
MCH: 31.6 pg (ref 26.0–34.0)
MCHC: 34 g/dL (ref 30.0–36.0)
MCV: 93.1 fL (ref 78.0–100.0)
Monocytes Absolute: 0.6 10*3/uL (ref 0.1–1.0)
Monocytes Relative: 8 %
NEUTROS ABS: 5.4 10*3/uL (ref 1.7–7.7)
Neutrophils Relative %: 72 %
PLATELETS: 159 10*3/uL (ref 150–400)
RBC: 3.48 MIL/uL — AB (ref 4.22–5.81)
RDW: 15.1 % (ref 11.5–15.5)
WBC: 7.5 10*3/uL (ref 4.0–10.5)

## 2015-04-04 LAB — BASIC METABOLIC PANEL
ANION GAP: 18 — AB (ref 5–15)
BUN: 37 mg/dL — ABNORMAL HIGH (ref 6–20)
CALCIUM: 9 mg/dL (ref 8.9–10.3)
CO2: 25 mmol/L (ref 22–32)
Chloride: 90 mmol/L — ABNORMAL LOW (ref 101–111)
Creatinine, Ser: 10.95 mg/dL — ABNORMAL HIGH (ref 0.61–1.24)
GFR, EST AFRICAN AMERICAN: 4 mL/min — AB (ref >60)
GFR, EST NON AFRICAN AMERICAN: 4 mL/min — AB (ref >60)
GLUCOSE: 170 mg/dL — AB (ref 65–99)
POTASSIUM: 3.5 mmol/L (ref 3.5–5.1)
Sodium: 133 mmol/L — ABNORMAL LOW (ref 135–145)

## 2015-04-04 LAB — PROTIME-INR
INR: 4.51 — AB (ref 0.00–1.49)
PROTHROMBIN TIME: 41.6 s — AB (ref 11.6–15.2)

## 2015-04-04 MED ORDER — ACETAMINOPHEN 325 MG PO TABS: 650.0000 mg | ORAL_TABLET | Freq: Once | ORAL | Status: DC

## 2015-04-04 MED ORDER — HYDROMORPHONE HCL 1 MG/ML IJ SOLN
0.5000 mg | Freq: Once | INTRAMUSCULAR | Status: AC
  Administered 2015-04-04: 0.5 mg via INTRAMUSCULAR
  Filled 2015-04-04: qty 1

## 2015-04-04 MED ORDER — TRAMADOL HCL 50 MG PO TABS
50.0000 mg | ORAL_TABLET | Freq: Once | ORAL | Status: AC
  Administered 2015-04-04: 50 mg via ORAL
  Filled 2015-04-04: qty 1

## 2015-04-04 NOTE — Discharge Instructions (Signed)
Please follow up with your doctor for further management of your headache.  Your warfarin level is outside the normal range at 4.5.  Please hold off taking your warfarin for the next today.  Call and follow up with your coumadin clinic to discuss further management.  Take tylenol for headache

## 2015-04-04 NOTE — ED Notes (Signed)
Pt presents via POV with c/o headache.  He fell out of bed 10 days ago and this is when the headache began.  He was seen by [facility] for the same complaint and says his head CT was negative.  He is concerned because his headache persists.  He has taken Tylenol with moderate relief of pain.  He reports no new neurologic deficits.

## 2015-04-04 NOTE — ED Provider Notes (Signed)
CSN: [id number]     Arrival date & time 04/04/15  4098 History   First MD Initiated Contact with Patient 04/04/15 [phone]     Chief Complaint  Patient presents with  . Headache     (Consider location/radiation/quality/duration/timing/severity/associated sxs/prior Treatment) HPI   82 year old male with history of A. fib on Coumadin, end-stage renal disease, diabetes, hypertension, gout presenting for evaluation of headache.  Patient report 10 days ago he was in bed, tilted over to pick up something when he fell down to the ground and striking his head. There was no loss of consciousness and no precipitating symptoms prior to the fall. He did follow-up in the ED 7 days ago for this headache. States that his head CT was negative and he was given Tylenol for pain. He reports still having intermittent headache that has gotten progressively worsened and currently rated as 10 out of 10 throbbing pain from the left side of his forehead radiates to the vertex of his head and Tylenol provides minimal improvement. Otherwise patient denies having fever, URI symptoms, neck stiffness, neck pain, blurred vision, lightheadedness, dizziness, focal numbness or weakness, chest pain, shortness of breath, nausea or vomiting, light and sound sensitivity, or rash. 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  He has redness to his left eye usually when he doesn't take his eye medicine. He was seen by his ophthalmologist last week for that and now resuming his meds.       Past Medical History  Diagnosis Date  . Renal disorder   . Diabetes mellitus without complication (HCC)   . Atrial fibrillation (HCC)   . Hypertension   . Glaucoma   . Peripheral neuropathy (HCC)   . Gout   . Anemia    Past Surgical History  Procedure Laterality Date  . Ablation of dysrhythmic focus    . Sp av dialysis shunt access existing *l*     Family History  Problem Relation Age of Onset  . Alcoholism Brother    . Alcoholism Brother   . Other Mother     bowel obstruction  . Heart failure Father     fluid bluid up   Social History  Substance Use Topics  . Smoking status: Never Smoker   . Smokeless tobacco: Never Used  . Alcohol Use: No    Review of Systems  All other systems reviewed and are negative.     Allergies  Diltiazem  Home Medications   Prior to Admission medications   Medication Sig Start Date End Date Taking? Authorizing Provider  amiodarone (PACERONE) 100 MG tablet Take 1 tablet (100 mg total) by mouth at bedtime. 10/29/14   Lyn Records, MD  amLODipine (NORVASC) 10 MG tablet Take 10 mg by mouth daily.  01/09/14   Historical Provider, MD  aspirin EC 81 MG tablet Take 81 mg by mouth daily.    Historical Provider, MD  atropine 1 % ophthalmic solution Place 1 drop into the left eye 2 times daily at 12 noon and 4 pm.    Historical Provider, MD  benzonatate (TESSALON) 100 MG capsule Take by mouth 3 (three) times daily as needed for cough.    Historical Provider, MD  Calcium Acetate 667 MG TABS Take 2 capsules by mouth 3 (three) times daily.    Historical Provider, MD  docusate sodium (COLACE) 100 MG capsule Take 100 mg by mouth 2 (two) times daily.    Historical Provider, MD  dorzolamide-timolol (COSOPT) 22.3-6.8 MG/ML ophthalmic solution Place 2 drops into the right eye 2 (two) times daily.    Historical Provider, MD  folic acid-vitamin b complex-vitamin c-selenium-zinc (DIALYVITE) 3 MG TABS tablet Take 1 tablet by mouth daily.    Historical Provider, MD  glipiZIDE (GLUCOTROL) 10 MG tablet Take 10 mg by mouth 2 (two) times daily before a meal.    Historical Provider, MD  lidocaine-prilocaine (EMLA) cream Apply 1 application topically as needed (apply to skin near dialysis port before dialysis).  08/31/14   Historical Provider, MD  prednisoLONE acetate (PRED FORTE) 1 % ophthalmic suspension Place 1 drop into the left eye daily.    Historical Provider, MD  SENSIPAR 60 MG tablet  Take 60 mg by mouth daily. 09/03/14   Historical Provider, MD  simvastatin (ZOCOR) 20 MG tablet Take 20 mg by mouth every evening.    Historical Provider, MD  tobramycin-dexamethasone [facility]) ophthalmic solution Place 1 drop into the left eye QID.    Historical Provider, MD  TRAVATAN Z 0.004 % SOLN ophthalmic solution Place 1 drop into the right eye daily. 08/30/14   Historical Provider, MD  valsartan (DIOVAN) 160 MG tablet Take 160 mg by mouth daily.    Historical Provider, MD  warfarin (COUMADIN) 5 MG tablet Take as directed by coumadin clinic 03/11/15   Lyn Records, MD   There were no vitals taken for this visit. Physical Exam  Constitutional: He is oriented to person, place, and time. He appears well-developed and well-nourished. No distress.  African-American male laying in bed in no acute discomfort.  HENT:  Head: Atraumatic.  Tenderness to left parietal and left frontal forehead on palpation without overlying skin changes or crepitus.  Eyes:  Right eye with normal appearance. Left eye is significant for moderate chemosis throughout conjunctiva. Cornea is cloudy. This is chronic.  Neck: Neck supple.  No nuchal rigidity. No carotid bruit.  Cardiovascular:  Irregularly irregular heart rhythm with a faint systolic murmur  Pulmonary/Chest: Effort normal and breath sounds normal.  Abdominal: Soft. There is no tenderness.  Neurological: He is alert and oriented to person, place, and time.  Skin: No rash noted.  Psychiatric: He has a normal mood and affect.  Nursing note and vitals reviewed.   ED Course  Procedures (including critical care time) Labs Review Labs Reviewed  CBC WITH DIFFERENTIAL/PLATELET - Abnormal; Notable for the following:    RBC 3.48 (*)    Hemoglobin 11.0 (*)    HCT 32.4 (*)    All other components within normal limits  BASIC METABOLIC PANEL - Abnormal; Notable for the following:    Sodium 133 (*)    Chloride 90 (*)    Glucose, Bld 170 (*)    BUN 37 (*)     Creatinine, Ser 10.95 (*)    GFR calc non Af Amer 4 (*)    GFR calc Af Amer 4 (*)    Anion gap 18 (*)    All other components within normal limits  PROTIME-INR - Abnormal; Notable for the following:    Prothrombin Time 41.6 (*)    INR 4.51 (*)    All other components within normal limits    Imaging Review Ct Head Wo Contrast  04/04/2015  CLINICAL DATA:  Larey Seat last week. Fell from bed with trauma to the head and neck. Occipital region pain. Symptoms worsening. EXAM: CT HEAD WITHOUT CONTRAST CT CERVICAL SPINE WITHOUT CONTRAST TECHNIQUE: Multidetector CT imaging of the head and cervical spine was  performed following the standard protocol without intravenous contrast. Multiplanar CT image reconstructions of the cervical spine were also generated. COMPARISON:  04/01/2015 FINDINGS: CT HEAD FINDINGS The brain shows atrophy with chronic small-vessel ischemic changes throughout the white matter, thalami I and pons. No sign of acute infarction, mass lesion, hemorrhage, hydrocephalus or extra-axial collection. No skull fracture. No fluid in the sinuses, middle ears or mastoids. Chronic calcification of the left globe but as previously seen. CT CERVICAL SPINE FINDINGS No evidence of fracture or traumatic malalignment. There chronic degenerative changes at the C1-2 articulation but without encroachment upon the neural spaces. C2-3:  Chronic facet fusion on the left.  No stenosis. C3-4: Facet degeneration on the left. Mild foraminal narrowing. No significant stenosis. C4-5: Spondylosis more prominent towards the right. No significant stenosis. C5-6:  Spondylosis.  No significant stenosis. C6-7: Spondylosis. Facet degeneration on the left. No significant stenosis. C7-T1:  Mild facet degeneration.  No stenosis. IMPRESSION: Head CT: No acute or traumatic finding. Atrophy and chronic small vessel ischemic changes. Cervical spine CT: No acute or traumatic finding. Extensive chronic degenerative changes. Electronically  Signed   By: Paulina FusiMark  Shogry M.D.   On: 04/04/2015 10:10   Ct Cervical Spine Wo Contrast  04/04/2015  CLINICAL DATA:  Larey SeatFell last week. Fell from bed with trauma to the head and neck. Occipital region pain. Symptoms worsening. EXAM: CT HEAD WITHOUT CONTRAST CT CERVICAL SPINE WITHOUT CONTRAST TECHNIQUE: Multidetector CT imaging of the head and cervical spine was performed following the standard protocol without intravenous contrast. Multiplanar CT image reconstructions of the cervical spine were also generated. COMPARISON:  04/01/2015 FINDINGS: CT HEAD FINDINGS The brain shows atrophy with chronic small-vessel ischemic changes throughout the white matter, thalami I and pons. No sign of acute infarction, mass lesion, hemorrhage, hydrocephalus or extra-axial collection. No skull fracture. No fluid in the sinuses, middle ears or mastoids. Chronic calcification of the left globe but as previously seen. CT CERVICAL SPINE FINDINGS No evidence of fracture or traumatic malalignment. There chronic degenerative changes at the C1-2 articulation but without encroachment upon the neural spaces. C2-3:  Chronic facet fusion on the left.  No stenosis. C3-4: Facet degeneration on the left. Mild foraminal narrowing. No significant stenosis. C4-5: Spondylosis more prominent towards the right. No significant stenosis. C5-6:  Spondylosis.  No significant stenosis. C6-7: Spondylosis. Facet degeneration on the left. No significant stenosis. C7-T1:  Mild facet degeneration.  No stenosis. IMPRESSION: Head CT: No acute or traumatic finding. Atrophy and chronic small vessel ischemic changes. Cervical spine CT: No acute or traumatic finding. Extensive chronic degenerative changes. Electronically Signed   By: Paulina FusiMark  Shogry M.D.   On: 04/04/2015 10:10   I have personally reviewed and evaluated these images and lab results as part of my medical decision-making.   EKG Interpretation None      MDM   Final diagnoses:  Bad headache    BP  182/85 mmHg  Pulse 64  Temp(Src) 97.9 F (36.6 C) (Oral)  Resp 16  SpO2 98% The patient was noted to be hypertensive today in the emergency department. I have spoken with the patient regarding hypertension and the need for improved management. I instructed the patient to followup with the Primary care doctor within 4 days to improve the management of the patient's hypertension. I also counseled the patient regarding the signs and symptoms which would require an emergent visit to an emergency department for hypertensive urgency and/or hypertensive emergency. The patient understood the need for improved hypertensive management.  11:48 AM Patient presents with persistent left-sided headache since he had a fall 10 days ago. He had head CT scan performed 3 days ago for this headache that was negative for acute intracranial injury. Due to potential of a delay head bleed, I have reordered his head and neck CT scan today which shows no acute finding. He does have a supra therapeutic INR of 4.51. Kidney function reflect chronic kidney disease. I encouraged the patient to skip taking his Coumadin for the next 2 days, resume and follow-up with his doctor for further care. Care discussed with Dr. Madilyn Hook. Patient able to ambulate using a cane. Headache medication given.  2:29 PM Pt will f/u with PCP for further management of his headache.    Fayrene Helper, PA-C 04/04/15 1504  Tilden Fossa, MD 04/06/15 [phone]

## 2015-04-05 ENCOUNTER — Emergency Department (HOSPITAL_COMMUNITY)
Admission: EM | Admit: 2015-04-05 | Discharge: 2015-04-05 | Disposition: A | Discharge status: Home / Self Care | Payer: Medicare Other | Source: Home / Self Care | Attending: Emergency Medicine | Admitting: Emergency Medicine

## 2015-04-05 ENCOUNTER — Telehealth: Payer: Self-pay | Admitting: Interventional Cardiology

## 2015-04-05 ENCOUNTER — Encounter (HOSPITAL_COMMUNITY): Payer: Self-pay

## 2015-04-05 DIAGNOSIS — T82838A Hemorrhage of vascular prosthetic devices, implants and grafts, initial encounter: Secondary | ICD-10-CM | POA: Insufficient documentation

## 2015-04-05 DIAGNOSIS — H409 Unspecified glaucoma: Secondary | ICD-10-CM

## 2015-04-05 DIAGNOSIS — Z7984 Long term (current) use of oral hypoglycemic drugs: Secondary | ICD-10-CM

## 2015-04-05 DIAGNOSIS — N186 End stage renal disease: Secondary | ICD-10-CM

## 2015-04-05 DIAGNOSIS — I120 Hypertensive chronic kidney disease with stage 5 chronic kidney disease or end stage renal disease: Secondary | ICD-10-CM

## 2015-04-05 DIAGNOSIS — Z7952 Long term (current) use of systemic steroids: Secondary | ICD-10-CM

## 2015-04-05 DIAGNOSIS — Z7901 Long term (current) use of anticoagulants: Secondary | ICD-10-CM

## 2015-04-05 DIAGNOSIS — I4891 Unspecified atrial fibrillation: Secondary | ICD-10-CM | POA: Insufficient documentation

## 2015-04-05 DIAGNOSIS — H578 Other specified disorders of eye and adnexa: Secondary | ICD-10-CM | POA: Insufficient documentation

## 2015-04-05 DIAGNOSIS — Z79899 Other long term (current) drug therapy: Secondary | ICD-10-CM | POA: Insufficient documentation

## 2015-04-05 DIAGNOSIS — Z862 Personal history of diseases of the blood and blood-forming organs and certain disorders involving the immune mechanism: Secondary | ICD-10-CM | POA: Insufficient documentation

## 2015-04-05 DIAGNOSIS — T829XXA Unspecified complication of cardiac and vascular prosthetic device, implant and graft, initial encounter: Secondary | ICD-10-CM

## 2015-04-05 DIAGNOSIS — Y658 Other specified misadventures during surgical and medical care: Secondary | ICD-10-CM | POA: Insufficient documentation

## 2015-04-05 DIAGNOSIS — E119 Type 2 diabetes mellitus without complications: Secondary | ICD-10-CM | POA: Insufficient documentation

## 2015-04-05 DIAGNOSIS — Z8739 Personal history of other diseases of the musculoskeletal system and connective tissue: Secondary | ICD-10-CM

## 2015-04-05 DIAGNOSIS — H5442 Blindness, left eye, normal vision right eye: Secondary | ICD-10-CM

## 2015-04-05 DIAGNOSIS — Z992 Dependence on renal dialysis: Secondary | ICD-10-CM

## 2015-04-05 DIAGNOSIS — Z7982 Long term (current) use of aspirin: Secondary | ICD-10-CM

## 2015-04-05 LAB — PROTIME-INR
INR: 4.32 — AB (ref 0.00–1.49)
PROTHROMBIN TIME: 40.3 s — AB (ref 11.6–15.2)

## 2015-04-05 NOTE — Telephone Encounter (Signed)
New MEssage  Pt calling top speak w/ RN- stated he had fallen sometime last week and wanted to discuss- can leave detailed message; Please call back and discuss.

## 2015-04-05 NOTE — Telephone Encounter (Signed)
Returned pt call.[email or web address] 

## 2015-04-05 NOTE — ED Provider Notes (Signed)
CSN: [id number]     Arrival date & time 04/05/15  1738 History   First MD Initiated Contact with Patient 04/05/15 1740     Chief Complaint  Patient presents with  . Vascular Access Problem     (Consider location/radiation/quality/duration/timing/severity/associated sxs/prior Treatment) HPI Comments: Patient is a 82 year old male patient he presents from his dialysis center with bleeding from his fistula. He has a history of atrial fibrillation and is on Coumadin, diabetes, hypertension, gout, and left eye blindness. He was at his dialysis center and had his normal dialysis today. When they pulled the dialysis needle out, he's had ongoing bleeding from the site. They apparently held pressure for about 45 minutes and were not able to get the bleeding stopped and sent him over here for further evaluation. He states he otherwise is feeling fine. He denies any other complaints. However he has had some watery discharge from his left eye for about a week. He states he uses steroid eyedrops in the left eye and has been out of them for about 2 weeks. He denies any pain in the eye. He is completely blind in the left eye and does not even see light.   Past Medical History  Diagnosis Date  . Renal disorder   . Diabetes mellitus without complication (HCC)   . Atrial fibrillation (HCC)   . Hypertension   . Glaucoma   . Peripheral neuropathy (HCC)   . Gout   . Anemia    Past Surgical History  Procedure Laterality Date  . Ablation of dysrhythmic focus    . Sp av dialysis shunt access existing *l*     Family History  Problem Relation Age of Onset  . Alcoholism Brother   . Alcoholism Brother   . Other Mother     bowel obstruction  . Heart failure Father     fluid bluid up   Social History  Substance Use Topics  . Smoking status: Never Smoker   . Smokeless tobacco: Never Used  . Alcohol Use: No    Review of Systems  Constitutional: Negative for fever, chills, diaphoresis and fatigue.   HENT: Negative for congestion, rhinorrhea and sneezing.   Eyes: Positive for discharge and redness. Negative for photophobia and visual disturbance.  Respiratory: Negative for cough, chest tightness and shortness of breath.   Cardiovascular: Negative for chest pain and leg swelling.  Gastrointestinal: Negative for nausea, vomiting, abdominal pain, diarrhea and blood in stool.  Genitourinary: Negative for frequency, hematuria, flank pain and difficulty urinating.  Musculoskeletal: Negative for back pain and arthralgias.  Skin: Positive for wound. Negative for rash.  Neurological: Negative for dizziness, speech difficulty, weakness, numbness and headaches.      Allergies  Diltiazem  Home Medications   Prior to Admission medications   Medication Sig Start Date End Date Taking? Authorizing Provider  acetaminophen (TYLENOL) 325 MG tablet Take 650 mg by mouth daily as needed for mild pain.   Yes Historical Provider, MD  albuterol (PROVENTIL HFA;VENTOLIN HFA) 108 (90 Base) MCG/ACT inhaler Inhale 1-2 puffs into the lungs every 6 (six) hours as needed for wheezing or shortness of breath.  03/29/15 03/28/16 Yes Historical Provider, MD  amiodarone (PACERONE) 100 MG tablet Take 1 tablet (100 mg total) by mouth at bedtime. 10/29/14  Yes Lyn RecordsHenry W Smith, MD  amLODipine (NORVASC) 10 MG tablet Take 10 mg by mouth at bedtime.  01/09/14  Yes Historical Provider, MD  aspirin EC 81 MG tablet Take 81 mg by mouth daily.  Yes Historical Provider, MD  atropine 1 % ophthalmic solution Place 1 drop into the left eye 2 times daily at 12 noon and 4 pm.   Yes Historical Provider, MD  Calcium Acetate 667 MG TABS Take 1-2 capsules by mouth 3 (three) times daily. 667 mg for snacks, 1334 mg for meals   Yes Historical Provider, MD  docusate sodium (COLACE) 100 MG capsule Take 100 mg by mouth 2 (two) times daily.   Yes Historical Provider, MD  dorzolamide-timolol (COSOPT) 22.3-6.8 MG/ML ophthalmic solution Place 1 drop into  the right eye 2 (two) times daily.    Yes Historical Provider, MD  erythromycin ophthalmic ointment Place 1 application into the left eye at bedtime. 1/2 inch ribbon in the left lower eye lid 03/29/15 04/08/15 Yes Historical Provider, MD  folic acid-vitamin b complex-vitamin c-selenium-zinc (DIALYVITE) 3 MG TABS tablet Take 1 tablet by mouth daily.   Yes Historical Provider, MD  glipiZIDE (GLUCOTROL) 10 MG tablet Take 10 mg by mouth 2 (two) times daily before a meal.   Yes Historical Provider, MD  lidocaine-prilocaine (EMLA) cream Apply 1 application topically as needed (apply to skin near dialysis port before dialysis).  08/31/14  Yes Historical Provider, MD  prednisoLONE acetate (PRED FORTE) 1 % ophthalmic suspension Place 1 drop into the left eye 2 (two) times daily.    Yes Historical Provider, MD  SENSIPAR 60 MG tablet Take 60 mg by mouth at bedtime.  09/03/14  Yes Historical Provider, MD  simvastatin (ZOCOR) 20 MG tablet Take 20 mg by mouth every evening.   Yes Historical Provider, MD  tobramycin-dexamethasone [facility]) ophthalmic solution Place 1 drop into the left eye QID.   Yes Historical Provider, MD  TRAVATAN Z 0.004 % SOLN ophthalmic solution Place 1 drop into the right eye daily. 08/30/14  Yes Historical Provider, MD  valsartan (DIOVAN) 160 MG tablet Take 160 mg by mouth at bedtime.    Yes Historical Provider, MD  warfarin (COUMADIN) 5 MG tablet Take as directed by coumadin clinic Patient taking differently: Take 2.5 mg by mouth daily at 6 PM. 10 mg on Thursday, 2.5 mg all other days 03/11/15  Yes Lyn Records, MD   BP 154/87 mmHg  Pulse 91  Temp(Src) 99.1 F (37.3 C) (Oral)  Resp 16  SpO2 96% Physical Exam  Constitutional: He is oriented to person, place, and time. He appears well-developed and well-nourished.  HENT:  Head: Normocephalic and atraumatic.  Eyes:  Patient's left eye has moderate chemosis with erythema of the conjunctiva. There is cloudiness of the cornea and obliteration  of the pupil  Neck: Normal range of motion. Neck supple.  Cardiovascular: Normal rate, regular rhythm and normal heart sounds.   Pulmonary/Chest: Effort normal and breath sounds normal. No respiratory distress. He has no wheezes. He has no rales. He exhibits no tenderness.  Abdominal: Soft. Bowel sounds are normal. There is no tenderness. There is no rebound and no guarding.  Musculoskeletal: Normal range of motion. He exhibits no edema.  Patient has a large fistula to his left upper arm that is pulsatile. He has 2 small puncture wounds. The distal wound has stopped bleeding. There is still some small oozing from the proximal wound.  Lymphadenopathy:    He has no cervical adenopathy.  Neurological: He is alert and oriented to person, place, and time.  Skin: Skin is warm and dry. No rash noted.  Psychiatric: He has a normal mood and affect.    ED Course  Procedures (  including critical care time) Labs Review Labs Reviewed  PROTIME-INR - Abnormal; Notable for the following:    Prothrombin Time 40.3 (*)    INR 4.32 (*)    All other components within normal limits    Imaging Review Ct Head Wo Contrast  04/04/2015  CLINICAL DATA:  Larey Seat last week. Fell from bed with trauma to the head and neck. Occipital region pain. Symptoms worsening. EXAM: CT HEAD WITHOUT CONTRAST CT CERVICAL SPINE WITHOUT CONTRAST TECHNIQUE: Multidetector CT imaging of the head and cervical spine was performed following the standard protocol without intravenous contrast. Multiplanar CT image reconstructions of the cervical spine were also generated. COMPARISON:  04/01/2015 FINDINGS: CT HEAD FINDINGS The brain shows atrophy with chronic small-vessel ischemic changes throughout the white matter, thalami I and pons. No sign of acute infarction, mass lesion, hemorrhage, hydrocephalus or extra-axial collection. No skull fracture. No fluid in the sinuses, middle ears or mastoids. Chronic calcification of the left globe but as  previously seen. CT CERVICAL SPINE FINDINGS No evidence of fracture or traumatic malalignment. There chronic degenerative changes at the C1-2 articulation but without encroachment upon the neural spaces. C2-3:  Chronic facet fusion on the left.  No stenosis. C3-4: Facet degeneration on the left. Mild foraminal narrowing. No significant stenosis. C4-5: Spondylosis more prominent towards the right. No significant stenosis. C5-6:  Spondylosis.  No significant stenosis. C6-7: Spondylosis. Facet degeneration on the left. No significant stenosis. C7-T1:  Mild facet degeneration.  No stenosis. IMPRESSION: Head CT: No acute or traumatic finding. Atrophy and chronic small vessel ischemic changes. Cervical spine CT: No acute or traumatic finding. Extensive chronic degenerative changes. Electronically Signed   By: Paulina Fusi M.D.   On: 04/04/2015 10:10   Ct Cervical Spine Wo Contrast  04/04/2015  CLINICAL DATA:  Larey Seat last week. Fell from bed with trauma to the head and neck. Occipital region pain. Symptoms worsening. EXAM: CT HEAD WITHOUT CONTRAST CT CERVICAL SPINE WITHOUT CONTRAST TECHNIQUE: Multidetector CT imaging of the head and cervical spine was performed following the standard protocol without intravenous contrast. Multiplanar CT image reconstructions of the cervical spine were also generated. COMPARISON:  04/01/2015 FINDINGS: CT HEAD FINDINGS The brain shows atrophy with chronic small-vessel ischemic changes throughout the white matter, thalami I and pons. No sign of acute infarction, mass lesion, hemorrhage, hydrocephalus or extra-axial collection. No skull fracture. No fluid in the sinuses, middle ears or mastoids. Chronic calcification of the left globe but as previously seen. CT CERVICAL SPINE FINDINGS No evidence of fracture or traumatic malalignment. There chronic degenerative changes at the C1-2 articulation but without encroachment upon the neural spaces. C2-3:  Chronic facet fusion on the left.  No  stenosis. C3-4: Facet degeneration on the left. Mild foraminal narrowing. No significant stenosis. C4-5: Spondylosis more prominent towards the right. No significant stenosis. C5-6:  Spondylosis.  No significant stenosis. C6-7: Spondylosis. Facet degeneration on the left. No significant stenosis. C7-T1:  Mild facet degeneration.  No stenosis. IMPRESSION: Head CT: No acute or traumatic finding. Atrophy and chronic small vessel ischemic changes. Cervical spine CT: No acute or traumatic finding. Extensive chronic degenerative changes. Electronically Signed   By: Paulina Fusi M.D.   On: 04/04/2015 10:10   I have personally reviewed and evaluated these images and lab results as part of my medical decision-making.   EKG Interpretation None      MDM   Final diagnoses:  Dialysis complication, initial encounter [facility])    Patient presents with bleeding from puncture wounds  from the dialysis fistula. There is no overlying scab. A quick clot dressing was placed. He was monitored for several hours and had no further episodes of bleeding. His bleeding has completely stopped. His INR was elevated around 4. Is to hold his Coumadin for 2 days and contact his Coumadin clinic for further management. He does have watery discharge and injected conjunctiva to his left eye. He has no vision in the eye. He states he had run out of his eyedrops but his wife states he started back again yesterday. She states that his eye frequently gets like this. She states it's not any different then he's had in the past. He has an appointment with his ophthalmologist in 3 days.    Rolan Bucco, MD 04/06/15 [phone]

## 2015-04-05 NOTE — ED Notes (Signed)
Wound drtessed with abd pad, guaze and combat qauze by MD.

## 2015-04-05 NOTE — ED Notes (Signed)
Pt arrives EMS with C/o bleeding from graft after deacessed after full run of dialysis. Direct pressure heald for 90 minutes at arrival.

## 2015-04-05 NOTE — ED Notes (Signed)
No bleeding noted at dressing site

## 2015-04-07 ENCOUNTER — Encounter (HOSPITAL_COMMUNITY): Payer: Self-pay | Admitting: Emergency Medicine

## 2015-04-07 ENCOUNTER — Inpatient Hospital Stay (HOSPITAL_COMMUNITY)
Admission: EM | Admit: 2015-04-07 | Discharge: 2015-04-08 | DRG: 308 | Disposition: A | Discharge status: Home / Self Care | Payer: Medicare Other | Attending: Interventional Cardiology | Admitting: Interventional Cardiology

## 2015-04-07 DIAGNOSIS — Y838 Other surgical procedures as the cause of abnormal reaction of the patient, or of later complication, without mention of misadventure at the time of the procedure: Secondary | ICD-10-CM | POA: Diagnosis present

## 2015-04-07 DIAGNOSIS — Z888 Allergy status to other drugs, medicaments and biological substances status: Secondary | ICD-10-CM | POA: Diagnosis not present

## 2015-04-07 DIAGNOSIS — N186 End stage renal disease: Secondary | ICD-10-CM | POA: Diagnosis present

## 2015-04-07 DIAGNOSIS — R001 Bradycardia, unspecified: Secondary | ICD-10-CM | POA: Diagnosis not present

## 2015-04-07 DIAGNOSIS — I4891 Unspecified atrial fibrillation: Secondary | ICD-10-CM | POA: Diagnosis present

## 2015-04-07 DIAGNOSIS — M109 Gout, unspecified: Secondary | ICD-10-CM | POA: Diagnosis present

## 2015-04-07 DIAGNOSIS — D638 Anemia in other chronic diseases classified elsewhere: Secondary | ICD-10-CM | POA: Diagnosis present

## 2015-04-07 DIAGNOSIS — T82838A Hemorrhage of vascular prosthetic devices, implants and grafts, initial encounter: Secondary | ICD-10-CM | POA: Diagnosis present

## 2015-04-07 DIAGNOSIS — I4892 Unspecified atrial flutter: Secondary | ICD-10-CM

## 2015-04-07 DIAGNOSIS — Z7984 Long term (current) use of oral hypoglycemic drugs: Secondary | ICD-10-CM

## 2015-04-07 DIAGNOSIS — Z7951 Long term (current) use of inhaled steroids: Secondary | ICD-10-CM | POA: Diagnosis not present

## 2015-04-07 DIAGNOSIS — H409 Unspecified glaucoma: Secondary | ICD-10-CM | POA: Diagnosis present

## 2015-04-07 DIAGNOSIS — I480 Paroxysmal atrial fibrillation: Secondary | ICD-10-CM | POA: Diagnosis present

## 2015-04-07 DIAGNOSIS — R000 Tachycardia, unspecified: Secondary | ICD-10-CM

## 2015-04-07 DIAGNOSIS — E1122 Type 2 diabetes mellitus with diabetic chronic kidney disease: Secondary | ICD-10-CM | POA: Diagnosis present

## 2015-04-07 DIAGNOSIS — Z7901 Long term (current) use of anticoagulants: Secondary | ICD-10-CM

## 2015-04-07 DIAGNOSIS — H5442 Blindness, left eye, normal vision right eye: Secondary | ICD-10-CM | POA: Diagnosis present

## 2015-04-07 DIAGNOSIS — I120 Hypertensive chronic kidney disease with stage 5 chronic kidney disease or end stage renal disease: Secondary | ICD-10-CM | POA: Diagnosis present

## 2015-04-07 DIAGNOSIS — I10 Essential (primary) hypertension: Secondary | ICD-10-CM | POA: Diagnosis present

## 2015-04-07 DIAGNOSIS — G629 Polyneuropathy, unspecified: Secondary | ICD-10-CM | POA: Diagnosis present

## 2015-04-07 DIAGNOSIS — Z992 Dependence on renal dialysis: Secondary | ICD-10-CM

## 2015-04-07 DIAGNOSIS — Z79899 Other long term (current) drug therapy: Secondary | ICD-10-CM | POA: Diagnosis not present

## 2015-04-07 DIAGNOSIS — I159 Secondary hypertension, unspecified: Secondary | ICD-10-CM

## 2015-04-07 DIAGNOSIS — H5440 Blindness, one eye, unspecified eye: Secondary | ICD-10-CM | POA: Diagnosis present

## 2015-04-07 HISTORY — DX: Dependence on renal dialysis: Z99.2

## 2015-04-07 HISTORY — DX: End stage renal disease: N18.6

## 2015-04-07 LAB — COMPREHENSIVE METABOLIC PANEL
ALT: 32 U/L (ref 17–63)
ANION GAP: 12 (ref 5–15)
AST: 30 U/L (ref 15–41)
Albumin: 3 g/dL — ABNORMAL LOW (ref 3.5–5.0)
Alkaline Phosphatase: 59 U/L (ref 38–126)
BUN: 15 mg/dL (ref 6–20)
CALCIUM: 8.6 mg/dL — AB (ref 8.9–10.3)
CHLORIDE: 95 mmol/L — AB (ref 101–111)
CO2: 31 mmol/L (ref 22–32)
CREATININE: 5.68 mg/dL — AB (ref 0.61–1.24)
GFR, EST AFRICAN AMERICAN: 10 mL/min — AB (ref >60)
GFR, EST NON AFRICAN AMERICAN: 9 mL/min — AB (ref >60)
Glucose, Bld: 204 mg/dL — ABNORMAL HIGH (ref 65–99)
Potassium: 3 mmol/L — ABNORMAL LOW (ref 3.5–5.1)
SODIUM: 138 mmol/L (ref 135–145)
Total Bilirubin: 0.8 mg/dL (ref 0.3–1.2)
Total Protein: 8.3 g/dL — ABNORMAL HIGH (ref 6.5–8.1)

## 2015-04-07 LAB — CBC WITH DIFFERENTIAL/PLATELET
Basophils Absolute: 0.1 10*3/uL (ref 0.0–0.1)
Basophils Relative: 1 %
EOS ABS: 0.1 10*3/uL (ref 0.0–0.7)
EOS PCT: 1 %
HCT: 30.3 % — ABNORMAL LOW (ref 39.0–52.0)
Hemoglobin: 10.1 g/dL — ABNORMAL LOW (ref 13.0–17.0)
LYMPHS ABS: 1.2 10*3/uL (ref 0.7–4.0)
LYMPHS PCT: 15 %
MCH: 31 pg (ref 26.0–34.0)
MCHC: 33.3 g/dL (ref 30.0–36.0)
MCV: 92.9 fL (ref 78.0–100.0)
MONO ABS: 1.2 10*3/uL — AB (ref 0.1–1.0)
MONOS PCT: 15 %
Neutro Abs: 5.2 10*3/uL (ref 1.7–7.7)
Neutrophils Relative %: 68 %
PLATELETS: 207 10*3/uL (ref 150–400)
RBC: 3.26 MIL/uL — AB (ref 4.22–5.81)
RDW: 15 % (ref 11.5–15.5)
WBC: 7.7 10*3/uL (ref 4.0–10.5)

## 2015-04-07 LAB — PROTIME-INR
INR: 2.03 — AB (ref 0.00–1.49)
PROTHROMBIN TIME: 22.9 s — AB (ref 11.6–15.2)

## 2015-04-07 MED ORDER — LIDOCAINE-PRILOCAINE 2.5-2.5 % EX CREA
1.0000 "application " | TOPICAL_CREAM | CUTANEOUS | Status: DC | PRN
  Filled 2015-04-07: qty 5

## 2015-04-07 MED ORDER — AMIODARONE HCL 200 MG PO TABS
200.0000 mg | ORAL_TABLET | Freq: Every day | ORAL | Status: DC
  Filled 2015-04-07 (×2): qty 1

## 2015-04-07 MED ORDER — CALCIUM ACETATE 667 MG PO TABS: 1.0000 | ORAL_TABLET | Freq: Three times a day (TID) | ORAL | Status: DC

## 2015-04-07 MED ORDER — SIMVASTATIN 20 MG PO TABS
20.0000 mg | ORAL_TABLET | Freq: Every evening | ORAL | Status: DC
Start: 2015-04-07 — End: 2015-04-08
  Administered 2015-04-08: 20 mg via ORAL
  Filled 2015-04-07: qty 1

## 2015-04-07 MED ORDER — NITROGLYCERIN 0.4 MG SL SUBL: 0.4000 mg | SUBLINGUAL_TABLET | SUBLINGUAL | Status: DC | PRN

## 2015-04-07 MED ORDER — WARFARIN - PHARMACIST DOSING INPATIENT: Freq: Every day | Status: DC

## 2015-04-07 MED ORDER — ALBUTEROL SULFATE (2.5 MG/3ML) 0.083% IN NEBU: 2.5000 mg | INHALATION_SOLUTION | Freq: Four times a day (QID) | RESPIRATORY_TRACT | Status: DC | PRN

## 2015-04-07 MED ORDER — ONDANSETRON HCL 4 MG/2ML IJ SOLN: 4.0000 mg | Freq: Four times a day (QID) | INTRAMUSCULAR | Status: DC | PRN

## 2015-04-07 MED ORDER — SODIUM CHLORIDE 0.9 % IV SOLN: 250.0000 mL | INTRAVENOUS | Status: DC | PRN

## 2015-04-07 MED ORDER — SODIUM CHLORIDE 0.9% FLUSH
3.0000 mL | Freq: Two times a day (BID) | INTRAVENOUS | Status: DC
Start: 2015-04-07 — End: 2015-04-08
  Administered 2015-04-08: 3 mL via INTRAVENOUS

## 2015-04-07 MED ORDER — DORZOLAMIDE HCL-TIMOLOL MAL 2-0.5 % OP SOLN
1.0000 [drp] | Freq: Two times a day (BID) | OPHTHALMIC | Status: DC
  Administered 2015-04-07 – 2015-04-08 (×2): 1 [drp] via OPHTHALMIC
  Filled 2015-04-07: qty 10

## 2015-04-07 MED ORDER — ERYTHROMYCIN 5 MG/GM OP OINT
1.0000 "application " | TOPICAL_OINTMENT | Freq: Every day | OPHTHALMIC | Status: DC
  Administered 2015-04-07: 1 via OPHTHALMIC
  Filled 2015-04-07: qty 3.5

## 2015-04-07 MED ORDER — ACETAMINOPHEN 325 MG PO TABS: 650.0000 mg | ORAL_TABLET | ORAL | Status: DC | PRN

## 2015-04-07 MED ORDER — ALBUTEROL SULFATE HFA 108 (90 BASE) MCG/ACT IN AERS: 1.0000 | INHALATION_SPRAY | Freq: Four times a day (QID) | RESPIRATORY_TRACT | Status: DC | PRN

## 2015-04-07 MED ORDER — INSULIN ASPART 100 UNIT/ML ~~LOC~~ SOLN: 0.0000 [IU] | Freq: Every day | SUBCUTANEOUS | Status: DC

## 2015-04-07 MED ORDER — DOCUSATE SODIUM 100 MG PO CAPS
100.0000 mg | ORAL_CAPSULE | Freq: Two times a day (BID) | ORAL | Status: DC
  Administered 2015-04-07 – 2015-04-08 (×2): 100 mg via ORAL
  Filled 2015-04-07 (×2): qty 1

## 2015-04-07 MED ORDER — WARFARIN SODIUM 7.5 MG PO TABS
7.5000 mg | ORAL_TABLET | Freq: Once | ORAL | Status: AC
  Administered 2015-04-07: 7.5 mg via ORAL
  Filled 2015-04-07 (×2): qty 1

## 2015-04-07 MED ORDER — POTASSIUM CHLORIDE CRYS ER 20 MEQ PO TBCR
40.0000 meq | EXTENDED_RELEASE_TABLET | Freq: Once | ORAL | Status: AC
  Administered 2015-04-07: 40 meq via ORAL
  Filled 2015-04-07: qty 2

## 2015-04-07 MED ORDER — DILTIAZEM HCL 25 MG/5ML IV SOLN
20.0000 mg | Freq: Once | INTRAVENOUS | Status: AC
  Administered 2015-04-07: 20 mg via INTRAVENOUS
  Filled 2015-04-07: qty 5

## 2015-04-07 MED ORDER — SODIUM CHLORIDE 0.9% FLUSH: 3.0000 mL | INTRAVENOUS | Status: DC | PRN

## 2015-04-07 MED ORDER — GLIPIZIDE 10 MG PO TABS
10.0000 mg | ORAL_TABLET | Freq: Two times a day (BID) | ORAL | Status: DC
  Administered 2015-04-08: 10 mg via ORAL
  Filled 2015-04-07: qty 1

## 2015-04-07 MED ORDER — ATROPINE SULFATE 1 % OP SOLN
1.0000 [drp] | Freq: Two times a day (BID) | OPHTHALMIC | Status: DC
  Administered 2015-04-08 (×2): 1 [drp] via OPHTHALMIC
  Filled 2015-04-07: qty 2

## 2015-04-07 MED ORDER — AMLODIPINE BESYLATE 10 MG PO TABS
10.0000 mg | ORAL_TABLET | Freq: Every day | ORAL | Status: DC
  Administered 2015-04-07: 10 mg via ORAL
  Filled 2015-04-07: qty 1

## 2015-04-07 MED ORDER — AMIODARONE HCL IN DEXTROSE 360-4.14 MG/200ML-% IV SOLN
60.0000 mg/h | Freq: Once | INTRAVENOUS | Status: AC
  Administered 2015-04-07: 60 mg/h via INTRAVENOUS
  Filled 2015-04-07: qty 200

## 2015-04-07 MED ORDER — DIALYVITE 3000 3 MG PO TABS: 1.0000 | ORAL_TABLET | Freq: Every day | ORAL | Status: DC

## 2015-04-07 MED ORDER — LATANOPROST 0.005 % OP SOLN
1.0000 [drp] | Freq: Every day | OPHTHALMIC | Status: DC
  Administered 2015-04-07: 1 [drp] via OPHTHALMIC
  Filled 2015-04-07: qty 2.5

## 2015-04-07 MED ORDER — ALPRAZOLAM 0.25 MG PO TABS: 0.2500 mg | ORAL_TABLET | Freq: Two times a day (BID) | ORAL | Status: DC | PRN

## 2015-04-07 MED ORDER — CALCIUM ACETATE (PHOS BINDER) 667 MG PO CAPS
1334.0000 mg | ORAL_CAPSULE | Freq: Three times a day (TID) | ORAL | Status: DC
  Administered 2015-04-08 (×2): 667 mg via ORAL
  Filled 2015-04-07 (×2): qty 2

## 2015-04-07 MED ORDER — DILTIAZEM HCL 100 MG IV SOLR: 5.0000 mg/h | INTRAVENOUS | Status: DC

## 2015-04-07 MED ORDER — IRBESARTAN 150 MG PO TABS
150.0000 mg | ORAL_TABLET | Freq: Every day | ORAL | Status: DC
  Administered 2015-04-08 (×2): 150 mg via ORAL
  Filled 2015-04-07 (×2): qty 1

## 2015-04-07 MED ORDER — INSULIN ASPART 100 UNIT/ML ~~LOC~~ SOLN
0.0000 [IU] | Freq: Three times a day (TID) | SUBCUTANEOUS | Status: DC
  Administered 2015-04-08: 1 [IU] via SUBCUTANEOUS
  Administered 2015-04-08: 2 [IU] via SUBCUTANEOUS

## 2015-04-07 MED ORDER — DILTIAZEM LOAD VIA INFUSION: 20.0000 mg | Freq: Once | INTRAVENOUS | Status: DC

## 2015-04-07 MED ORDER — ZOLPIDEM TARTRATE 5 MG PO TABS: 5.0000 mg | ORAL_TABLET | Freq: Every evening | ORAL | Status: DC | PRN

## 2015-04-07 MED ORDER — CINACALCET HCL 30 MG PO TABS
60.0000 mg | ORAL_TABLET | Freq: Every day | ORAL | Status: DC
  Filled 2015-04-07: qty 2

## 2015-04-07 MED ORDER — CALCIUM ACETATE (PHOS BINDER) 667 MG PO CAPS: 667.0000 mg | ORAL_CAPSULE | ORAL | Status: DC | PRN

## 2015-04-07 MED ORDER — RENA-VITE PO TABS
1.0000 | ORAL_TABLET | Freq: Every day | ORAL | Status: DC
  Filled 2015-04-07: qty 1

## 2015-04-07 MED ORDER — ASPIRIN EC 81 MG PO TBEC
81.0000 mg | DELAYED_RELEASE_TABLET | Freq: Every day | ORAL | Status: DC
  Administered 2015-04-08: 81 mg via ORAL
  Filled 2015-04-07: qty 1

## 2015-04-07 MED ORDER — AMIODARONE HCL IN DEXTROSE 360-4.14 MG/200ML-% IV SOLN
30.0000 mg/h | INTRAVENOUS | Status: DC
  Administered 2015-04-07 – 2015-04-08 (×2): 30 mg/h via INTRAVENOUS
  Filled 2015-04-07 (×2): qty 200

## 2015-04-07 NOTE — ED Provider Notes (Signed)
82 year old male who presents with tachycardia from dialysis. The patient states that he had no symptoms, no weakness, no shortness of breath or chest pain and no palpitations. He started dialysis, finished an entire four-hour session, was tachycardic throughout with a regular narrow complex tachycardia. The patient does see Dr. Verdis PrimeHenry Smith is a cardiologist. He is unsure what cardiac problems he has. On exam the patient is in no distress, he is regular narrow complex tachycardia  The patient was given Cardizem, only a slight improvement of his heart rate however this improved increased, he did require amiodarone with a drip, cardiology was consult at, the patient needs a higher level of care, critical care provided  CRITICAL CARE Performed by: Vida RollerMILLER,Zainah Steven D Total critical care time: 35 minutes Critical care time was exclusive of separately billable procedures and treating other patients. Critical care was necessary to treat or prevent imminent or life-threatening deterioration. Critical care was time spent personally by me on the following activities: development of treatment plan with patient and/or surrogate as well as nursing, discussions with consultants, evaluation of patient's response to treatment, examination of patient, obtaining history from patient or surrogate, ordering and performing treatments and interventions, ordering and review of laboratory studies, ordering and review of radiographic studies, pulse oximetry and re-evaluation of patient's condition.  I saw and evaluated the patient, reviewed the resident's note and I agree with the findings and plan.   EKG Interpretation  Date/Time:  Wednesday April 07 2015 18:14:36 EST Ventricular Rate:  145 PR Interval:  184 QRS Duration: 84 QT Interval:  336 QTC Calculation: 522 R Axis:   62 Text Interpretation:  Atrial flutter 2:1 block Nonspecific T wave abnormality Abnormal ekg Since last tracing now with aflutter Confirmed by Reagan Behlke   MD, Anabell Swint ([id number]) on 04/07/2015 10:36:29 PM       I personally interpreted the EKG as well as the resident and agree with the interpretation on the resident's chart.  Final diagnoses:  Tachyarrhythmia      Eber HongBrian Shardee Dieu, MD 04/09/15 2138

## 2015-04-07 NOTE — H&P (Signed)
Patient ID: [name] [name] MRN: [id number], DOB/AGE: [birth year]   Admit date: 04/07/2015   Primary Physician: Katy Apo, MD Primary Cardiologist: Dr Katrinka Blazing  HPI: 82 y/o AA male with a history of PAF, HTN, NIDDM, and ESRD. He has been on low dose Amiodarone Rx- "Dr Katrinka Blazing told me I could take 1/2 of 200 mg". He is on Coumadin,  [facility]. He has ESRD and is on HD MWF. Today at dialysis it was noted his HR was high. The pt was a little dizzy- I think he could tell huis HR was fast- "started this morning". In the ED he was initially in atrial flutter with 2:1, now in AF with VR-120.    Problem List: Past Medical History  Diagnosis Date  . Renal disorder   . Diabetes mellitus without complication (HCC)   . Atrial fibrillation (HCC)   . Hypertension   . Glaucoma   . Peripheral neuropathy (HCC)   . Gout   . Anemia     Past Surgical History  Procedure Laterality Date  . Ablation of dysrhythmic focus    . Sp av dialysis shunt access existing *l*       Allergies:  Allergies  Allergen Reactions  . Diltiazem Other (See Comments)    SEVERE STOMACH CRAMPING PER PT     Home Medications Prior to Admission medications   Medication Sig Start Date End Date Taking? Authorizing Provider  acetaminophen (TYLENOL) 325 MG tablet Take 650 mg by mouth daily as needed for mild pain.   Yes Historical Provider, MD  albuterol (PROVENTIL HFA;VENTOLIN HFA) 108 (90 Base) MCG/ACT inhaler Inhale 1-2 puffs into the lungs every 6 (six) hours as needed for wheezing or shortness of breath.  03/29/15 03/28/16 Yes Historical Provider, MD  amiodarone (PACERONE) 100 MG tablet Take 1 tablet (100 mg total) by mouth at bedtime. 10/29/14  Yes Lyn Records, MD  amLODipine (NORVASC) 10 MG tablet Take 10 mg by mouth at bedtime.  01/09/14  Yes Historical Provider, MD  aspirin EC 81 MG tablet Take 81 mg by mouth daily.   Yes Historical Provider, MD  atropine 1 % ophthalmic solution Place 1 drop  into the left eye 2 times daily at 12 noon and 4 pm.   Yes Historical Provider, MD  Calcium Acetate 667 MG TABS Take 1-2 capsules by mouth 3 (three) times daily. 667 mg for snacks, 1334 mg for meals   Yes Historical Provider, MD  docusate sodium (COLACE) 100 MG capsule Take 100 mg by mouth 2 (two) times daily.   Yes Historical Provider, MD  dorzolamide-timolol (COSOPT) 22.3-6.8 MG/ML ophthalmic solution Place 1 drop into the right eye 2 (two) times daily.    Yes Historical Provider, MD  erythromycin ophthalmic ointment Place 1 application into the left eye at bedtime. 1/2 inch ribbon in the left lower eye lid 03/29/15 04/08/15 Yes Historical Provider, MD  folic acid-vitamin b complex-vitamin c-selenium-zinc (DIALYVITE) 3 MG TABS tablet Take 1 tablet by mouth daily.   Yes Historical Provider, MD  glipiZIDE (GLUCOTROL) 10 MG tablet Take 10 mg by mouth 2 (two) times daily before a meal.   Yes Historical Provider, MD  lidocaine-prilocaine (EMLA) cream Apply 1 application topically as needed (apply to skin near dialysis port before dialysis).  08/31/14  Yes Historical Provider, MD  SENSIPAR 60 MG tablet Take 60 mg by mouth at bedtime.  09/03/14  Yes Historical Provider, MD  simvastatin (ZOCOR) 20 MG tablet Take 20  mg by mouth every evening.   Yes Historical Provider, MD  TRAVATAN Z 0.004 % SOLN ophthalmic solution Place 1 drop into the right eye at bedtime.  08/30/14  Yes Historical Provider, MD  valsartan (DIOVAN) 160 MG tablet Take 160 mg by mouth at bedtime.    Yes Historical Provider, MD  warfarin (COUMADIN) 5 MG tablet Take as directed by coumadin clinic Patient taking differently: Take 7.5-10 mg by mouth daily at 6 PM. Takes  on thurs only Takes 7.5mg  on all other days 03/11/15  Yes Lyn Records, MD     Family History  Problem Relation Age of Onset  . Alcoholism Brother   . Alcoholism Brother   . Other Mother     bowel obstruction  . Heart failure Father     fluid bluid up     Social History    Social History  . Marital Status: Married    Spouse Name: N/A  . Number of Children: N/A  . Years of Education: N/A   Occupational History  . Not on file.   Social History Main Topics  . Smoking status: Never Smoker   . Smokeless tobacco: Never Used  . Alcohol Use: No  . Drug Use: No  . Sexual Activity: Not on file   Other Topics Concern  . Not on file   Social History Narrative     Review of Systems: General: negative for chills, fever, night sweats or weight changes.  Cardiovascular: negative for chest pain, dyspnea on exertion, edema, orthopnea, palpitations, paroxysmal nocturnal dyspnea or shortness of breath HEENT: negative for any visual disturbances, blindness, glaucoma-blind OS, poor dentition Dermatological: negative for rash Respiratory: negative for cough, hemoptysis, or wheezing Urologic: negative for hematuria or dysuria Abdominal: negative for nausea, vomiting, diarrhea, bright red blood per rectum, melena, or hematemesis Neurologic: negative for visual changes, syncope, or dizziness Musculoskeletal: negative for back pain, joint pain, or swelling Psych: cooperative and appropriate All other systems reviewed and are otherwise negative except as noted above.  Physical Exam: Blood pressure 127/84, pulse 142, temperature 98.6 F (37 C), temperature source Oral, resp. rate 22, height  (1.93 m), SpO2 99 %.  General appearance: alert, cooperative and no distress Neck: no carotid bruit and no JVD Lungs: clear to auscultation bilaterally Heart: irregularly irregular rhythm Abdomen: soft non tender Extremities: no edema Pulses: diminnished distal pulses Skin: Skin color, texture, turgor normal. No rashes or lesions Neurologic: Grossly normal    Labs:   Results for orders placed or performed during the hospital encounter of 04/07/15 (from the past 24 hour(s))  CBC with Differential     Status: Abnormal   Collection Time: 04/07/15  4:05 PM  Result  Value Ref Range   WBC 7.7 4.0 - 10.5 K/uL   RBC 3.26 (L) 4.22 - 5.81 MIL/uL   Hemoglobin 10.1 (L) 13.0 - 17.0 g/dL   HCT 84.6 (L) 96.2 - 95.2 %   MCV 92.9 78.0 - 100.0 fL   MCH 31.0 26.0 - 34.0 pg   MCHC 33.3 30.0 - 36.0 g/dL   RDW 84.1 32.4 - 40.1 %   Platelets 207 150 - 400 K/uL   Neutrophils Relative % 68 %   Neutro Abs 5.2 1.7 - 7.7 K/uL   Lymphocytes Relative 15 %   Lymphs Abs 1.2 0.7 - 4.0 K/uL   Monocytes Relative 15 %   Monocytes Absolute 1.2 (H) 0.1 - 1.0 K/uL   Eosinophils Relative 1 %   Eosinophils Absolute  0.1 0.0 - 0.7 K/uL   Basophils Relative 1 %   Basophils Absolute 0.1 0.0 - 0.1 K/uL  Comprehensive metabolic panel     Status: Abnormal   Collection Time: 04/07/15  4:05 PM  Result Value Ref Range   Sodium 138 135 - 145 mmol/L   Potassium 3.0 (L) 3.5 - 5.1 mmol/L   Chloride 95 (L) 101 - 111 mmol/L   CO2 31 22 - 32 mmol/L   Glucose, Bld 204 (H) 65 - 99 mg/dL   BUN 15 6 - 20 mg/dL   Creatinine, Ser 1.615.68 (H) 0.61 - 1.24 mg/dL   Calcium 8.6 (L) 8.9 - 10.3 mg/dL   Total Protein 8.3 (H) 6.5 - 8.1 g/dL   Albumin 3.0 (L) 3.5 - 5.0 g/dL   AST 30 15 - 41 U/L   ALT 32 17 - 63 U/L   Alkaline Phosphatase 59 38 - 126 U/L   Total Bilirubin 0.8 0.3 - 1.2 mg/dL   GFR calc non Af Amer 9 (L) >60 mL/min   GFR calc Af Amer 10 (L) >60 mL/min   Anion gap 12 5 - 15  Protime-INR     Status: Abnormal   Collection Time: 04/07/15  4:05 PM  Result Value Ref Range   Prothrombin Time 22.9 (H) 11.6 - 15.2 seconds   INR 2.03 (H) 0.00 - 1.49     Radiology/Studies: Ct Head Wo Contrast  04/04/2015  CLINICAL DATA:  Larey SeatFell last week. Fell from bed with trauma to the head and neck. Occipital region  IMPRESSION: Head CT: No acute or traumatic finding. Atrophy and chronic small vessel ischemic changes. Cervical spine CT: No acute or traumatic finding. Extensive chronic degenerative changes. Electronically Signed   By: Paulina FusiMark  Shogry M.D.   On: 04/04/2015 10:10    WRU:EAVWUJEKG:Narrow complex tachycardia at  146- no clear flutter waves but suspect this was 2:1 flutter. He is now in AF  ASSESSMENT AND PLAN:  Principal Problem:   Atrial fibrillation with RVR (HCC) Active Problems:   Atrial flutter (HCC)   Diabetes mellitus with ESRD (end-stage renal disease) (HCC)   ESRD (end stage renal disease) (HCC)   Bradycardia   Hypertension   Blind left eye   On amiodarone therapy   PLAN: Admit, replace K+, continue IV Amiodarone, increase PO dose at discharge. Call renal in am if pt is not discharged- he will need HD Friday.    SignedAbelino Derrick, KILROY,LUKE K, PA-C 04/07/2015, 7:45 PM [phone]  The patient was seen and examined, and I agree with the assessment and plan as documented above. Pt with h/o atrial flutter and RFA for PSVT in the past admitted with rapid atrial fibrillation, currently on IV amiodarone.  Denies chest pain, palpitations, and shortness of breath. Anticoagulated with warfarin with therapeutic INR. Hopefully he will revert back to sinus rhythm with IV amiodarone. I agree that he may need to be on 200 mg daily as an outpatient so long as he does not develop bradycardia.   Prentice DockerSuresh Maliya Marich, MD, [facility]  04/07/2015 8:01 PM

## 2015-04-07 NOTE — Progress Notes (Signed)
ANTICOAGULATION CONSULT NOTE - Initial Consult  Pharmacy Consult for Coumadin Indication: atrial fibrillation  Allergies  Allergen Reactions  . Diltiazem Other (See Comments)    SEVERE STOMACH CRAMPING PER PT    Patient Measurements: Height: 6\' 4"  (193 cm) IBW/kg (Calculated) : 86.8   Vital Signs: Temp: 98.6 F (37 C) (03/08 1600) Temp Source: Oral (03/08 1600) BP: 119/94 mmHg (03/08 2030) Pulse Rate: 127 (03/08 2030)  Labs:  Recent Labs  04/05/15 1855 04/07/15 1605  HGB  --  10.1*  HCT  --  30.3*  PLT  --  207  LABPROT 40.3* 22.9*  INR 4.32* 2.03*  CREATININE  --  5.68*    Estimated Creatinine Clearance: 12.9 mL/min (by C-G formula based on Cr of 5.68).   Medical History: Past Medical History  Diagnosis Date  . Renal disorder   . Diabetes mellitus without complication (HCC)   . Atrial fibrillation (HCC)   . Hypertension   . Glaucoma   . Peripheral neuropathy (HCC)   . Gout   . Anemia      Assessment: [age]  yom with ESRD on HD admitted 04/07/2015  a/ Afib w/ RVR.   Coumadin PTA for AF.  PTA dose = 10mg  on Thursday, and 7.5mg  all other days (last dose 3/7)  INR on admit 2.03, Hgb 10.1, PLT 207   **on amio drip and orders in to increase to 20mg  daily from home dose of 100mg  daily   Goal of Therapy:  INR 2-3 Monitor platelets by anticoagulation protocol: Yes   Plan:  Coumadin 7.5mg  x1 tonight Daily INR/CBC Monitor for s/s of bleeding Increasing amio dose   Ilaria Much C. Marvis MoellerMiles, PharmD Pharmacy Resident  Pager: [phone] 04/07/2015 9:22 PM

## 2015-04-07 NOTE — ED Provider Notes (Signed)
CSN: [id number]     Arrival date & time 04/07/15  1555 History   First MD Initiated Contact with Patient 04/07/15 [phone]     Chief Complaint  Patient presents with  . Tachycardia     (Consider location/radiation/quality/duration/timing/severity/associated sxs/prior Treatment) Patient is a 82 y.o. male presenting with general illness. The history is provided by the patient.  Illness Quality:  Tachycardia Severity:  Moderate Onset quality:  Unable to specify Duration: Unknown. This is noted or patient was getting dialysis today. Timing:  Constant Progression:  Unchanged Chronicity:  Recurrent Context:  Patient has a history of paroxysmal SVT, A. fib/flutter on amiodarone Relieved by:  Nothing tract Associated symptoms: no abdominal pain, no chest pain, no congestion, no cough, no diarrhea, no ear pain, no fatigue, no fever, no headaches, no nausea, no rash, no shortness of breath, no sore throat and no vomiting     Past Medical History  Diagnosis Date  . Renal disorder   . Diabetes mellitus without complication (HCC)   . Atrial fibrillation (HCC)   . Hypertension   . Glaucoma   . Peripheral neuropathy (HCC)   . Gout   . Anemia    Past Surgical History  Procedure Laterality Date  . Ablation of dysrhythmic focus    . Sp av dialysis shunt access existing *l*     Family History  Problem Relation Age of Onset  . Alcoholism Brother   . Alcoholism Brother   . Other Mother     bowel obstruction  . Heart failure Father     fluid bluid up   Social History  Substance Use Topics  . Smoking status: Never Smoker   . Smokeless tobacco: Never Used  . Alcohol Use: No    Review of Systems  Constitutional: Negative for fever, chills, appetite change and fatigue.  HENT: Negative for congestion, ear pain, facial swelling, mouth sores and sore throat.   Eyes: Negative for visual disturbance.  Respiratory: Negative for cough, chest tightness and shortness of breath.   Cardiovascular:  Negative for chest pain and palpitations.  Gastrointestinal: Negative for nausea, vomiting, abdominal pain, diarrhea and blood in stool.  Endocrine: Negative for cold intolerance and heat intolerance.  Genitourinary: Negative for frequency, decreased urine volume and difficulty urinating.  Musculoskeletal: Negative for back pain and neck stiffness.  Skin: Negative for rash.  Neurological: Negative for dizziness, weakness, light-headedness and headaches.  All other systems reviewed and are negative.     Allergies  Diltiazem  Home Medications   Prior to Admission medications   Medication Sig Start Date End Date Taking? Authorizing Provider  acetaminophen (TYLENOL) 325 MG tablet Take 650 mg by mouth daily as needed for mild pain.   Yes Historical Provider, MD  albuterol (PROVENTIL HFA;VENTOLIN HFA) 108 (90 Base) MCG/ACT inhaler Inhale 1-2 puffs into the lungs every 6 (six) hours as needed for wheezing or shortness of breath.  03/29/15 03/28/16 Yes Historical Provider, MD  amiodarone (PACERONE) 100 MG tablet Take 1 tablet (100 mg total) by mouth at bedtime. 10/29/14  Yes Lyn RecordsHenry W Smith, MD  amLODipine (NORVASC) 10 MG tablet Take 10 mg by mouth at bedtime.  01/09/14  Yes Historical Provider, MD  aspirin EC 81 MG tablet Take 81 mg by mouth daily.   Yes Historical Provider, MD  atropine 1 % ophthalmic solution Place 1 drop into the left eye 2 times daily at 12 noon and 4 pm.   Yes Historical Provider, MD  Calcium Acetate 667 MG TABS  Take 1-2 capsules by mouth 3 (three) times daily. 667 mg for snacks, 1334 mg for meals   Yes Historical Provider, MD  docusate sodium (COLACE) 100 MG capsule Take 100 mg by mouth 2 (two) times daily.   Yes Historical Provider, MD  dorzolamide-timolol (COSOPT) 22.3-6.8 MG/ML ophthalmic solution Place 1 drop into the right eye 2 (two) times daily.    Yes Historical Provider, MD  erythromycin ophthalmic ointment Place 1 application into the left eye at bedtime. 1/2 inch  ribbon in the left lower eye lid 03/29/15 04/08/15 Yes Historical Provider, MD  folic acid-vitamin b complex-vitamin c-selenium-zinc (DIALYVITE) 3 MG TABS tablet Take 1 tablet by mouth daily.   Yes Historical Provider, MD  glipiZIDE (GLUCOTROL) 10 MG tablet Take 10 mg by mouth 2 (two) times daily before a meal.   Yes Historical Provider, MD  lidocaine-prilocaine (EMLA) cream Apply 1 application topically as needed (apply to skin near dialysis port before dialysis).  08/31/14  Yes Historical Provider, MD  SENSIPAR 60 MG tablet Take 60 mg by mouth at bedtime.  09/03/14  Yes Historical Provider, MD  simvastatin (ZOCOR) 20 MG tablet Take 20 mg by mouth every evening.   Yes Historical Provider, MD  TRAVATAN Z 0.004 % SOLN ophthalmic solution Place 1 drop into the right eye at bedtime.  08/30/14  Yes Historical Provider, MD  valsartan (DIOVAN) 160 MG tablet Take 160 mg by mouth at bedtime.    Yes Historical Provider, MD  warfarin (COUMADIN) 5 MG tablet Take as directed by coumadin clinic Patient taking differently: Take 7.5-10 mg by mouth daily at 6 PM. Takes  on thurs only Takes 7.5mg  on all other days 03/11/15  Yes Lyn Records, MD   BP 142/93 mmHg  Pulse 146  Temp(Src) 98.6 F (37 C) (Oral)  Resp 17  Ht  (1.93 m)  SpO2 95% Physical Exam  Constitutional: He is oriented to person, place, and time. He appears well-nourished. No distress.  HENT:  Head: Normocephalic and atraumatic.  Right Ear: External ear normal.  Left Ear: External ear normal.  Eyes: Pupils are equal, round, and reactive to light. Right eye exhibits no discharge. Left eye exhibits no discharge. No scleral icterus.  Neck: Normal range of motion. Neck supple.  Cardiovascular: Normal rate.  Exam reveals no gallop and no friction rub.   No murmur heard. Pulmonary/Chest: Effort normal and breath sounds normal. No stridor. No respiratory distress. He has no wheezes. He has no rales. He exhibits no tenderness.  Abdominal: Soft. He  exhibits no distension and no mass. There is no tenderness. There is no rebound and no guarding.  Musculoskeletal: He exhibits no edema or tenderness.  Neurological: He is alert and oriented to person, place, and time.  Skin: Skin is warm and dry. No rash noted. He is not diaphoretic. No erythema.    ED Course  Procedures (including critical care time) Labs Review Labs Reviewed  CBC WITH DIFFERENTIAL/PLATELET - Abnormal; Notable for the following:    RBC 3.26 (*)    Hemoglobin 10.1 (*)    HCT 30.3 (*)    Monocytes Absolute 1.2 (*)    All other components within normal limits  COMPREHENSIVE METABOLIC PANEL - Abnormal; Notable for the following:    Potassium 3.0 (*)    Chloride 95 (*)    Glucose, Bld 204 (*)    Creatinine, Ser 5.68 (*)    Calcium 8.6 (*)    Total Protein 8.3 (*)  Albumin 3.0 (*)    GFR calc non Af Amer 9 (*)    GFR calc Af Amer 10 (*)    All other components within normal limits  PROTIME-INR - Abnormal; Notable for the following:    Prothrombin Time 22.9 (*)    INR 2.03 (*)    All other components within normal limits  MRSA PCR SCREENING  TSH  T4, FREE  BASIC METABOLIC PANEL  PROTIME-INR  CBC    Imaging Review No results found. I have personally reviewed and evaluated these images and lab results as part of my medical decision-making.   EKG Interpretation   Date/Time:  Wednesday April 07 2015 18:14:36 EST Ventricular Rate:  145 PR Interval:  184 QRS Duration: 84 QT Interval:  336 QTC Calculation: 522 R Axis:   62 Text Interpretation:  Atrial flutter 2:1 block Nonspecific T wave  abnormality Abnormal ekg Since last tracing now with aflutter Confirmed by  MILLER  MD, BRIAN ([id number]) on 04/07/2015 10:36:29 PM      MDM   Asymptomatic tachycardia. EKG with tachyarrhythmia either SVT or flutter given the persistent rate at 145. Vagal maneuvers unsuccessful. Bolus of diltiazem given with some improvement of his tachycardia and down to the 120s.  Rhythm noted to be A. fib on the monitor. Heart rate slowly increased and was persistent at 145. Amiodarone drip initiated. Cardiology consulted and will admit the patient for further evaluation and management.  She remained hemodynamically stable while in the ED and was transported to the floor.   Patient seen in conjunction with Dr. Hyacinth Meeker  Final diagnoses:  Loni Dolly, MD 04/07/15 [id number]  Eber Hong, MD 04/09/15 2138

## 2015-04-07 NOTE — ED Notes (Signed)
Pt came from dialysis. Pt tachy HR 140-150. Pt was recently taken off of coumadin due to levels being too high. Was recently seen for bleeding graft. Pt has hx of Afib. Pt asymptomatic until HR reached 160- pt felt dizzy. Denies N/V/SOB denies CP. Pt finished dialysis treatment then was brought to this ED. Pt has been having infections in left eye. BP 120/83, 99% on 2L [place] (pt was 93% on room air). CBG 211.

## 2015-04-07 NOTE — ED Notes (Signed)
Cardiology at the bedside.

## 2015-04-08 ENCOUNTER — Encounter (HOSPITAL_COMMUNITY): Payer: Self-pay | Admitting: General Practice

## 2015-04-08 DIAGNOSIS — I10 Essential (primary) hypertension: Secondary | ICD-10-CM

## 2015-04-08 DIAGNOSIS — E1122 Type 2 diabetes mellitus with diabetic chronic kidney disease: Secondary | ICD-10-CM

## 2015-04-08 LAB — CBC
HCT: 26.5 % — ABNORMAL LOW (ref 39.0–52.0)
HEMOGLOBIN: 8.9 g/dL — AB (ref 13.0–17.0)
MCH: 31.4 pg (ref 26.0–34.0)
MCHC: 33.6 g/dL (ref 30.0–36.0)
MCV: 93.6 fL (ref 78.0–100.0)
Platelets: 169 10*3/uL (ref 150–400)
RBC: 2.83 MIL/uL — ABNORMAL LOW (ref 4.22–5.81)
RDW: 15.1 % (ref 11.5–15.5)
WBC: 7.5 10*3/uL (ref 4.0–10.5)

## 2015-04-08 LAB — BASIC METABOLIC PANEL
Anion gap: 14 (ref 5–15)
BUN: 22 mg/dL — ABNORMAL HIGH (ref 6–20)
CO2: 31 mmol/L (ref 22–32)
Calcium: 8.4 mg/dL — ABNORMAL LOW (ref 8.9–10.3)
Chloride: 95 mmol/L — ABNORMAL LOW (ref 101–111)
Creatinine, Ser: 7.74 mg/dL — ABNORMAL HIGH (ref 0.61–1.24)
GFR calc Af Amer: 7 mL/min — ABNORMAL LOW (ref >60)
GFR calc non Af Amer: 6 mL/min — ABNORMAL LOW (ref >60)
Glucose, Bld: 144 mg/dL — ABNORMAL HIGH (ref 65–99)
Potassium: 3.6 mmol/L (ref 3.5–5.1)
Sodium: 140 mmol/L (ref 135–145)

## 2015-04-08 LAB — PROTIME-INR
INR: 1.9 — AB (ref 0.00–1.49)
PROTHROMBIN TIME: 21.7 s — AB (ref 11.6–15.2)

## 2015-04-08 LAB — MRSA PCR SCREENING: MRSA BY PCR: NEGATIVE

## 2015-04-08 LAB — T4, FREE: Free T4: 1.03 ng/dL (ref 0.61–1.12)

## 2015-04-08 LAB — GLUCOSE, CAPILLARY
GLUCOSE-CAPILLARY: 141 mg/dL — AB (ref 65–99)
GLUCOSE-CAPILLARY: 143 mg/dL — AB (ref 65–99)
Glucose-Capillary: 169 mg/dL — ABNORMAL HIGH (ref 65–99)
Glucose-Capillary: 131 mg/dL — ABNORMAL HIGH (ref 65–99)

## 2015-04-08 LAB — TSH: TSH: 3.298 u[IU]/mL (ref 0.350–4.500)

## 2015-04-08 MED ORDER — AMIODARONE HCL 200 MG PO TABS
400.0000 mg | ORAL_TABLET | ORAL | Status: AC
  Administered 2015-04-08: 400 mg via ORAL
  Filled 2015-04-08: qty 2

## 2015-04-08 MED ORDER — WARFARIN SODIUM 10 MG PO TABS: 12.5000 mg | ORAL_TABLET | Freq: Once | ORAL | Status: DC

## 2015-04-08 MED ORDER — AMIODARONE HCL 200 MG PO TABS: 200.0000 mg | ORAL_TABLET | Freq: Every day | ORAL | Status: DC

## 2015-04-08 MED ORDER — AMIODARONE HCL 200 MG PO TABS
200.0000 mg | ORAL_TABLET | Freq: Every day | ORAL | Status: DC
Start: 2015-04-09 — End: 2015-04-08

## 2015-04-08 NOTE — Progress Notes (Signed)
ANTICOAGULATION CONSULT NOTE - Follow Up Consult  Pharmacy Consult for Coumadin Indication: Afib/flutter  Allergies  Allergen Reactions  . Diltiazem Other (See Comments)    SEVERE STOMACH CRAMPING PER PT    Patient Measurements: Height: 6\' 4"  (193 cm) Weight: 227 lb 11.8 oz (103.3 kg) ([facility] at this [facility] IBW/kg (Calculated) : 86.8 Heparin Dosing Weight:    Vital Signs: Temp: 98 F (36.7 C) (03/09 0326) Temp Source: Oral (03/09 0326) BP: 109/85 mmHg (03/09 0752) Pulse Rate: 134 (03/09 0326)  Labs:  Recent Labs  04/05/15 1855 04/07/15 1605 04/08/15 0404  HGB  --  10.1* 8.9*  HCT  --  30.3* 26.5*  PLT  --  207 169  LABPROT 40.3* 22.9* 21.7*  INR 4.32* 2.03* 1.90*  CREATININE  --  5.68* 7.74*    Estimated Creatinine Clearance: 9.5 mL/min (by C-G formula based on Cr of 7.74).  Assessment: [age]  yom with ESRD on HD admitted 04/07/2015  a/ Afib w/ RVR.   Anticoagulation: Afib on Coum PTA with admit INR 2.03. INR dropped to 1.9. (Amio PTA but now on infusion). Hgb 8.9 with anemia of chronic dz - PTA dose = 10mg  on Thursday, and 7.5mg  all other days (last dose 3/7)  Goal of Therapy:  INR 2-3 Monitor platelets by anticoagulation protocol: Yes   Plan:  Coumadin 12.5mg  po x 1 tonight. Daily INR  Merilynn Finlandobertson, Levi StraussCrystal Stillinger 04/08/2015,10:17 AM

## 2015-04-08 NOTE — Telephone Encounter (Signed)
Pt hospitalized

## 2015-04-08 NOTE — Progress Notes (Signed)
Discharge teaching and instructions reviewed. Pt has no further questions at this time. VSS. Pt discharging home via wife.

## 2015-04-08 NOTE — Discharge Summary (Signed)
Discharge Summary    Patient ID: [name] [name],  MRN: [id number], DOB/AGE: 80-May-1935 80 y.o.  Admit date: 04/07/2015 Discharge date: 04/08/2015  Primary Care Provider: Renford Dills D Primary Cardiologist: Dr. 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 Diltiazem Other (See Comments)    SEVERE STOMACH CRAMPING PER PT    Diagnostic Studies/Procedures    none  History of Present Illness      82 y/o AA male with a history of PAF, HTN, NIDDM, and ESRD on HD MWF. He is followed by Dr. Katrinka Blazing and is on low dose amiodarone (100 mg daily) and Coumadin for his afib. He is followed in our Coumadin Clinic. He reported to [facility] on 04/07/15 with a complaint of dizziness and an elevated HR that occurred during HD. Subsequently, he was transported to the [facility] ED where EKG confirmed atrial flutter w/ a RVR in the 120s. K was low at 3.0. TSH was normal. INR was therapeutic at 2.02 at time of admit.   Hospital Course     Patient was admitted to telemetry and started on IV amiodarone. He was given supplemental K. F/u BMP showed improved K at 3.6. Coumadin was continued per pharmacy. He was monitored on telemetry overnight and remained in atrial fibrillation. He spontaneously converted back to NSR on 04/08/15 at 12:41 pm. This was confirmed by EKG. His symptoms resolved. He was given 400 mg of PO amiodarone to continue loading and IV was discontinued. Decision was made to increase his home does to 200 mg daily. He was last seen and examined by Dr. Herbie Baltimore who determined he was stable for discharge home. He will f/u with an APP in 1 week for f/u and for repeat EKG. If still maintaining NSR, his amiodarone  can be reduced back down to 100 mg daily. He also has an upcomming f/u with Dr. Katrinka Blazing 05/13/15.   Consultants: none  Discharge Vitals Blood pressure 125/67, pulse 73, temperature 98.6 F (37 C), temperature source Oral, resp. rate 17, height 6\' 4"  (1.93 m), weight 227 lb 11.8 oz (103.3 kg), SpO2 100 %.  Filed Weights   04/07/15 2235 04/08/15 0145  Weight: 230 lb 6.1 oz (104.5 kg) 227 lb 11.8 oz (103.3 kg)   Physical Exam  Constitutional: He is oriented to person, place, and time. He appears well-developed. No distress.  HENT:  Head: Normocephalic.  Neck: Normal range of motion. No JVD present.  Cardiovascular: Normal rate and regular rhythm.   No murmur heard. Pulmonary/Chest: Effort normal and breath sounds normal. No respiratory distress. He has no wheezes. He has no rales.  Abdominal: Soft. Bowel sounds are normal. He exhibits no distension.  Musculoskeletal: He exhibits no edema.  Neurological: He is alert and oriented to person, place, and time.  Skin: Skin is warm and dry.  Psychiatric: He has a normal mood and affect. His behavior is normal.    Labs & Radiologic Studies     CBC  Recent Labs  04/07/15 1605 04/08/15 0404  WBC 7.7 7.5  NEUTROABS 5.2  --   HGB 10.1* 8.9*  HCT 30.3* 26.5*  MCV 92.9 93.6  PLT 207 169  Basic Metabolic Panel  Recent Labs  04/07/15 1605 04/08/15 0404  NA 138 140  K 3.0* 3.6  CL 95* 95*  CO2 31 31  GLUCOSE 204* 144*  BUN 15 22*  CREATININE 5.68* 7.74*  CALCIUM 8.6* 8.4*   Liver Function Tests  Recent Labs  04/07/15 1605  AST 30  ALT 32  ALKPHOS 59  BILITOT 0.8  PROT 8.3*  ALBUMIN 3.0*     Recent Labs  04/07/15 2228  TSH 3.298    Ct Head Wo Contrast  04/04/2015  CLINICAL DATA:  Larey Seat last week. Fell from bed with trauma to the head and neck. Occipital region pain. Symptoms worsening. EXAM: CT HEAD WITHOUT CONTRAST CT CERVICAL SPINE WITHOUT CONTRAST TECHNIQUE: Multidetector CT imaging of the head and cervical  spine was performed following the standard protocol without intravenous contrast. Multiplanar CT image reconstructions of the cervical spine were also generated. COMPARISON:  04/01/2015 FINDINGS: CT HEAD FINDINGS The brain shows atrophy with chronic small-vessel ischemic changes throughout the white matter, thalami I and pons. No sign of acute infarction, mass lesion, hemorrhage, hydrocephalus or extra-axial collection. No skull fracture. No fluid in the sinuses, middle ears or mastoids. Chronic calcification of the left globe but as previously seen. CT CERVICAL SPINE FINDINGS No evidence of fracture or traumatic malalignment. There chronic degenerative changes at the C1-2 articulation but without encroachment upon the neural spaces. C2-3:  Chronic facet fusion on the left.  No stenosis. C3-4: Facet degeneration on the left. Mild foraminal narrowing. No significant stenosis. C4-5: Spondylosis more prominent towards the right. No significant stenosis. C5-6:  Spondylosis.  No significant stenosis. C6-7: Spondylosis. Facet degeneration on the left. No significant stenosis. C7-T1:  Mild facet degeneration.  No stenosis. IMPRESSION: Head CT: No acute or traumatic finding. Atrophy and chronic small vessel ischemic changes. Cervical spine CT: No acute or traumatic finding. Extensive chronic degenerative changes. Electronically Signed   By: Paulina Fusi M.D.   On: 04/04/2015 10:10   Ct Head Wo Contrast  04/01/2015  CLINICAL DATA:  Posttraumatic headache after falling out of bed 2 days ago. No reported loss of consciousness. EXAM: CT HEAD WITHOUT CONTRAST CT CERVICAL SPINE WITHOUT CONTRAST TECHNIQUE: Multidetector CT imaging of the head and cervical spine was performed following the standard protocol without intravenous contrast. Multiplanar CT image reconstructions of the cervical spine were also generated. COMPARISON:  CT scan of head of July 10, 2014. FINDINGS: CT HEAD FINDINGS Bony calvarium appears intact. Mild  diffuse cortical atrophy is noted. Mild chronic ischemic white matter disease is noted. No mass effect or midline shift is noted. Ventricular size is within normal limits. There is no evidence of mass lesion, hemorrhage or acute infarction. CT CERVICAL SPINE FINDINGS Reversal of normal lordosis is noted due to degenerative disc disease. Severe degenerative disc disease is noted at C4-5, C5-6 and C6-7 with anterior osteophyte formation. No fracture or spondylolisthesis is noted. Visualized upper lung zones appear normal. IMPRESSION: Mild diffuse cortical atrophy. Mild chronic ischemic white matter disease. No acute intracranial abnormality seen. Severe multilevel degenerative disc disease. No acute abnormality seen in the cervical spine. Electronically Signed   By: Lupita Raider, M.D.   On: 04/01/2015 14:05   Ct Cervical Spine Wo Contrast  04/04/2015  CLINICAL DATA:  Larey Seat last week. Fell from bed with trauma to the head and neck. Occipital region pain. Symptoms worsening. EXAM: CT HEAD WITHOUT CONTRAST CT CERVICAL SPINE WITHOUT CONTRAST TECHNIQUE: Multidetector CT imaging of the head and cervical  spine was performed following the standard protocol without intravenous contrast. Multiplanar CT image reconstructions of the cervical spine were also generated. COMPARISON:  04/01/2015 FINDINGS: CT HEAD FINDINGS The brain shows atrophy with chronic small-vessel ischemic changes throughout the white matter, thalami I and pons. No sign of acute infarction, mass lesion, hemorrhage, hydrocephalus or extra-axial collection. No skull fracture. No fluid in the sinuses, middle ears or mastoids. Chronic calcification of the left globe but as previously seen. CT CERVICAL SPINE FINDINGS No evidence of fracture or traumatic malalignment. There chronic degenerative changes at the C1-2 articulation but without encroachment upon the neural spaces. C2-3:  Chronic facet fusion on the left.  No stenosis. C3-4: Facet degeneration on the  left. Mild foraminal narrowing. No significant stenosis. C4-5: Spondylosis more prominent towards the right. No significant stenosis. C5-6:  Spondylosis.  No significant stenosis. C6-7: Spondylosis. Facet degeneration on the left. No significant stenosis. C7-T1:  Mild facet degeneration.  No stenosis. IMPRESSION: Head CT: No acute or traumatic finding. Atrophy and chronic small vessel ischemic changes. Cervical spine CT: No acute or traumatic finding. Extensive chronic degenerative changes. Electronically Signed   By: Paulina FusiMark  Shogry M.D.   On: 04/04/2015 10:10   Ct Cervical Spine Wo Contrast  04/01/2015  CLINICAL DATA:  Posttraumatic headache after falling out of bed 2 days ago. No reported loss of consciousness. EXAM: CT HEAD WITHOUT CONTRAST CT CERVICAL SPINE WITHOUT CONTRAST TECHNIQUE: Multidetector CT imaging of the head and cervical spine was performed following the standard protocol without intravenous contrast. Multiplanar CT image reconstructions of the cervical spine were also generated. COMPARISON:  CT scan of head of July 10, 2014. FINDINGS: CT HEAD FINDINGS Bony calvarium appears intact. Mild diffuse cortical atrophy is noted. Mild chronic ischemic white matter disease is noted. No mass effect or midline shift is noted. Ventricular size is within normal limits. There is no evidence of mass lesion, hemorrhage or acute infarction. CT CERVICAL SPINE FINDINGS Reversal of normal lordosis is noted due to degenerative disc disease. Severe degenerative disc disease is noted at C4-5, C5-6 and C6-7 with anterior osteophyte formation. No fracture or spondylolisthesis is noted. Visualized upper lung zones appear normal. IMPRESSION: Mild diffuse cortical atrophy. Mild chronic ischemic white matter disease. No acute intracranial abnormality seen. Severe multilevel degenerative disc disease. No acute abnormality seen in the cervical spine. Electronically Signed   By: Lupita RaiderJames  Green Jr, M.D.   On: 04/01/2015 14:05     Disposition   Pt is being discharged home today in good condition.  Follow-up Plans & Appointments    Follow-up Information    [facility] [address] [facility] On 04/13/2015.   Specialty:  Cardiology   Why:  coumadin check 10:15 am   Contact information:   [address], [address] [place] [id number] [phone]      Follow up with Jacolyn ReedyMichele Lenze, PA-C On 04/19/2015.   Specialty:  Cardiology   Why:  2:30 PM (cardiology follow-up)   Contact information:   [address] [place] [address] [phone]       Follow up with Lesleigh NoeSMITH III,HENRY W, MD On 05/13/2015.   Specialty:  Cardiology   Why:  9:45 am    Contact information:   [address] [place] [address] [phone]      Discharge Instructions    Diet - low sodium heart healthy    Complete by:  As directed      Increase  activity slowly    Complete by:  As directed            Discharge Medications   Current Discharge Medication List    CONTINUE these medications which have CHANGED   Details  amiodarone (PACERONE) 200 MG tablet Take 1 tablet (200 mg total) by mouth at bedtime. Qty: 30 tablet, Refills: 5   Associated Diagnoses: On amiodarone therapy      CONTINUE these medications which have NOT CHANGED   Details  acetaminophen (TYLENOL) 325 MG tablet Take 650 mg by mouth daily as needed for mild pain.    albuterol (PROVENTIL HFA;VENTOLIN HFA) 108 (90 Base) MCG/ACT inhaler Inhale 1-2 puffs into the lungs every 6 (six) hours as needed for wheezing or shortness of breath.     amLODipine (NORVASC) 10 MG tablet Take 10 mg by mouth at bedtime.     aspirin EC 81 MG tablet Take 81 mg by mouth daily.    atropine 1 % ophthalmic solution Place 1 drop into the left eye 2 times daily at 12 noon and 4 pm.    Calcium Acetate 667 MG TABS Take 1-2 capsules by mouth 3 (three) times daily. 667 mg for snacks, 1334 mg for meals    docusate sodium  (COLACE) 100 MG capsule Take 100 mg by mouth 2 (two) times daily.    dorzolamide-timolol (COSOPT) 22.3-6.8 MG/ML ophthalmic solution Place 1 drop into the right eye 2 (two) times daily.     erythromycin ophthalmic ointment Place 1 application into the left eye at bedtime. 1/2 inch ribbon in the left lower eye lid    folic acid-vitamin b complex-vitamin c-selenium-zinc (DIALYVITE) 3 MG TABS tablet Take 1 tablet by mouth daily.    glipiZIDE (GLUCOTROL) 10 MG tablet Take 10 mg by mouth 2 (two) times daily before a meal.    lidocaine-prilocaine (EMLA) cream Apply 1 application topically as needed (apply to skin near dialysis port before dialysis).     SENSIPAR 60 MG tablet Take 60 mg by mouth at bedtime.     simvastatin (ZOCOR) 20 MG tablet Take 20 mg by mouth every evening.    TRAVATAN Z 0.004 % SOLN ophthalmic solution Place 1 drop into the right eye at bedtime.     valsartan (DIOVAN) 160 MG tablet Take 160 mg by mouth at bedtime.     warfarin (COUMADIN) 5 MG tablet Take as directed by coumadin clinic Qty: 50 tablet, Refills: 3          Outstanding Labs/Studies    Needs office EKG at time of f/u to assess for atrial fibrillation/flutter.   Duration of Discharge Encounter   Greater than 30 minutes including physician time.  Beau Fanny PA 04/08/2015, 4:14 PM  I saw examined the patient this afternoon along with Boyce Medici, PA-C. The patient was admitted from the dialysis unit found to be in A. fib with RVR. He was started on IV amiodarone overnight and cardioverted chemically earlier today. He feels better readings and letting without difficulty. I think he is ready for discharge. We've given him 4 mg amiodarone today and will treat him for 1 week with 200 mg and then he can likely reduce back down to 100 mg. He has a close follow-up visit with Dr. Katrinka Blazing pending. If possible we can get him in to see an extender to ensure that he remains in sinus rhythm prior to  his April 13 visit.  He remains on warfarin for anticoagulation. Other medications remain stable.  After seeing him today ambulating - albeit with some difficulty based on his baseline instability, I felt he was safe for discharge home after ambulation. His oxygen levels remain stable.   Marykay Lex, M.D., M.S. Interventional Cardiologist   Pager # [phone] Phone # [phone] [address] [place], [place] [address]

## 2015-04-08 NOTE — Progress Notes (Signed)
Pt ambulated 4035ft, approximately the distance he does at home. Tolerated well, required extra time and cane. Per pts wife pt moves at baseline. 02 sats 92% on room air.

## 2015-04-08 NOTE — Progress Notes (Signed)
pt converted to NSR 12:41, obtaining EKG. Cardiology notified.

## 2015-04-08 NOTE — Discharge Instructions (Addendum)
Information on my medicine - Coumadin   (Warfarin)  This medication education was reviewed with me or my healthcare representative as part of my discharge preparation.  The pharmacist that spoke with me during my hospital stay was:  Wayland Salinas, [facility]  Why was Coumadin prescribed for you? Coumadin was prescribed for you because you have a blood clot or a medical condition that can cause an increased risk of forming blood clots. Blood clots can cause serious health problems by blocking the flow of blood to the heart, lung, or brain. Coumadin can prevent harmful blood clots from forming. As a reminder your indication for Coumadin is:   Stroke Prevention Because Of Atrial Fibrillation  What test will check on my response to Coumadin? While on Coumadin (warfarin) you will need to have an INR test regularly to ensure that your dose is keeping you in the desired range. The INR (international normalized ratio) number is calculated from the result of the laboratory test called prothrombin time (PT).  If an INR APPOINTMENT HAS NOT ALREADY BEEN MADE FOR YOU please schedule an appointment to have this lab work done by your health care provider within 7 days. Your INR goal is usually a number between:  2 to 3 or your provider may give you a more narrow range like 2-2.5.  Ask your health care provider during an office visit what your goal INR is.  What  do you need to  know  About  COUMADIN? Take Coumadin (warfarin) exactly as prescribed by your healthcare provider about the same time each day.  DO NOT stop taking without talking to the doctor who prescribed the medication.  Stopping without other blood clot prevention medication to take the place of Coumadin may increase your risk of developing a new clot or stroke.  Get refills before you run out.  What do you do if you miss a dose? If you miss a dose, take it as soon as you remember on the same day then continue your regularly scheduled  regimen the next day.  Do not take two doses of Coumadin at the same time.  Important Safety Information A possible side effect of Coumadin (Warfarin) is an increased risk of bleeding. You should call your healthcare provider right away if you experience any of the following: ? Bleeding from an injury or your nose that does not stop. ? Unusual colored urine (red or dark brown) or unusual colored stools (red or black). ? Unusual bruising for unknown reasons. ? A serious fall or if you hit your head (even if there is no bleeding).  Some foods or medicines interact with Coumadin (warfarin) and might alter your response to warfarin. To help avoid this: ? Eat a balanced diet, maintaining a consistent amount of Vitamin K. ? Notify your provider about major diet changes you plan to make. ? Avoid alcohol or limit your intake to 1 drink for women and 2 drinks for men per day. (1 drink is 5 oz. wine, 12 oz. beer, or 1.5 oz. liquor.)  Make sure that ANY health care provider who prescribes medication for you knows that you are taking Coumadin (warfarin).  Also make sure the healthcare provider who is monitoring your Coumadin knows when you have started a new medication including herbals and non-prescription products.  Coumadin (Warfarin)  Major Drug Interactions  Increased Warfarin Effect Decreased Warfarin Effect  Alcohol (large quantities) Antibiotics (esp. Septra/Bactrim, Flagyl, Cipro) Amiodarone (Cordarone) Aspirin (ASA) Cimetidine (Tagamet) Megestrol (Megace) NSAIDs (ibuprofen,  naproxen, etc.) Piroxicam (Feldene) Propafenone (Rythmol SR) Propranolol (Inderal) Isoniazid (INH) Posaconazole (Noxafil) Barbiturates (Phenobarbital) Carbamazepine (Tegretol) Chlordiazepoxide (Librium) Cholestyramine (Questran) Griseofulvin Oral Contraceptives Rifampin Sucralfate (Carafate) Vitamin K   Coumadin (Warfarin) Major Herbal Interactions  Increased Warfarin Effect Decreased Warfarin Effect    Garlic Ginseng Ginkgo biloba Coenzyme Q10 Green tea St. Johns wort    Coumadin (Warfarin) FOOD Interactions  Eat a consistent number of servings per week of foods HIGH in Vitamin K (1 serving =  cup)  Collards (cooked, or boiled & drained) Kale (cooked, or boiled & drained) Mustard greens (cooked, or boiled & drained) Parsley *serving size only =  cup Spinach (cooked, or boiled & drained) Swiss chard (cooked, or boiled & drained) Turnip greens (cooked, or boiled & drained)  Eat a consistent number of servings per week of foods MEDIUM-HIGH in Vitamin K (1 serving = 1 cup)  Asparagus (cooked, or boiled & drained) Broccoli (cooked, boiled & drained, or raw & chopped) Brussel sprouts (cooked, or boiled & drained) *serving size only =  cup Lettuce, raw (green leaf, endive, romaine) Spinach, raw Turnip greens, raw & chopped   These websites have more information on Coumadin (warfarin):  http://www.king-russell.com/; https://www.hines.net/;   Atrial Fibrillation  Atrial fibrillation is a type of irregular or rapid heartbeat (arrhythmia). In atrial fibrillation, the heart quivers continuously in a chaotic pattern. This occurs when parts of the heart receive disorganized signals that make the heart unable to pump blood normally. This can increase the risk for stroke, heart failure, and other heart-related conditions. There are different types of atrial fibrillation, including:  Paroxysmal atrial fibrillation. This type starts suddenly, and it usually stops on its own shortly after it starts.  Persistent atrial fibrillation. This type often lasts longer than a week. It may stop on its own or with treatment.  Long-lasting persistent atrial fibrillation. This type lasts longer than 12 months.  Permanent atrial fibrillation. This type does not go away. Talk with your health care provider to learn about the type of atrial fibrillation that you have.  CAUSES  This condition is caused by  some heart-related conditions or procedures, including:  A heart attack.  Coronary artery disease.  Heart failure.  Heart valve conditions.  High blood pressure.  Inflammation of the sac that surrounds the heart (pericarditis).  Heart surgery.  Certain heart rhythm disorders, such as Wolf-Parkinson-White syndrome. Other causes include:  Pneumonia.  Obstructive sleep apnea.  Blockage of an artery in the lungs (pulmonary embolism, or PE).  Lung cancer.  Chronic lung disease.  Thyroid problems, especially if the thyroid is overactive (hyperthyroidism).  Caffeine.  Excessive alcohol use or illegal drug use.  Use of some medicines, including certain decongestants and diet pills. Sometimes, the cause cannot be found.  RISK FACTORS  This condition is more likely to develop in:  People who are older in age.  People who smoke.  People who have diabetes mellitus.  People who are overweight (obese).  Athletes who exercise vigorously. SYMPTOMS  Symptoms of this condition include:  A feeling that your heart is beating rapidly or irregularly.  A feeling of discomfort or pain in your chest.  Shortness of breath.  Sudden light-headedness or weakness.  Getting tired easily during exercise. In some cases, there are no symptoms.  DIAGNOSIS  Your health care provider may be able to detect atrial fibrillation when taking your pulse. If detected, this condition may be diagnosed with:  An electrocardiogram (ECG).  A Holter monitor test that records  your heartbeat patterns over a 24-hour period.  Transthoracic echocardiogram (TTE) to evaluate how blood flows through your heart.  Transesophageal echocardiogram (TEE) to view more detailed images of your heart.  A stress test.  Imaging tests, such as a CT scan or chest X-ray.  Blood tests. TREATMENT  The main goals of treatment are to prevent blood clots from forming and to keep your heart beating at a normal rate and rhythm. The type of treatment  that you receive depends on many factors, such as your underlying medical conditions and how you feel when you are experiencing atrial fibrillation.  This condition may be treated with:  Medicine to slow down the heart rate, bring the heart's rhythm back to normal, or prevent clots from forming.  Electrical cardioversion. This is a procedure that resets your heart's rhythm by delivering a controlled, low-energy shock to the heart through your skin.  Different types of ablation, such as catheter ablation, catheter ablation with pacemaker, or surgical ablation. These procedures destroy the heart tissues that send abnormal signals. When the pacemaker is used, it is placed under your skin to help your heart beat in a regular rhythm. HOME CARE INSTRUCTIONS  Take over-the counter and prescription medicines only as told by your health care provider.  If your health care provider prescribed a blood-thinning medicine (anticoagulant), take it exactly as told. Taking too much blood-thinning medicine can cause bleeding. If you do not take enough blood-thinning medicine, you will not have the protection that you need against stroke and other problems.  Do not use tobacco products, including cigarettes, chewing tobacco, and e-cigarettes. If you need help quitting, ask your health care provider.  If you have obstructive sleep apnea, manage your condition as told by your health care provider.  Do not drink alcohol.  Do not drink beverages that contain caffeine, such as coffee, soda, and tea.  Maintain a healthy weight. Do not use diet pills unless your health care provider approves. Diet pills may make heart problems worse.  Follow diet instructions as told by your health care provider.  Exercise regularly as told by your health care provider.  Keep all follow-up visits as told by your health care provider. This is important. PREVENTION  Avoid drinking beverages that contain caffeine or alcohol.  Avoid certain  medicines, especially medicines that are used for breathing problems.  Avoid certain herbs and herbal medicines, such as those that contain ephedra or ginseng.  Do not use illegal drugs, such as cocaine and amphetamines.  Do not smoke.  Manage your high blood pressure. SEEK MEDICAL CARE IF:  You notice a change in the rate, rhythm, or strength of your heartbeat.  You are taking an anticoagulant and you notice increased bruising.  You tire more easily when you exercise or exert yourself. SEEK IMMEDIATE MEDICAL CARE IF:  You have chest pain, abdominal pain, sweating, or weakness.  You feel nauseous.  You notice blood in your vomit, bowel movement, or urine.  You have shortness of breath.  You suddenly have swollen feet and ankles.  You feel dizzy.  You have sudden weakness or numbness of the face, arm, or leg, especially on one side of the body.  You have trouble speaking, trouble understanding, or both (aphasia).  Your face or your eyelid droops on one side. These symptoms may represent a serious problem that is an emergency. Do not wait to see if the symptoms will go away. Get medical help right away. Call your local emergency services (  911 in the [place] Do not drive yourself to the hospital.  This information is not intended to replace advice given to you by your health care provider. Make sure you discuss any questions you have with your health care provider.  Document Released: 01/16/2005 Document Revised: 10/07/2014 Document Reviewed: 05/13/2014  Elsevier Interactive Patient Education [employer]2016 [employer] Inc.

## 2015-04-12 ENCOUNTER — Emergency Department (HOSPITAL_COMMUNITY): Payer: Medicare Other

## 2015-04-12 ENCOUNTER — Inpatient Hospital Stay (HOSPITAL_COMMUNITY)
Admission: EM | Admit: 2015-04-12 | Discharge: 2015-04-17 | DRG: 193 | Disposition: A | Discharge status: Home Health | Payer: Medicare Other | Attending: Internal Medicine | Admitting: Internal Medicine

## 2015-04-12 ENCOUNTER — Encounter (HOSPITAL_COMMUNITY): Payer: Self-pay | Admitting: Emergency Medicine

## 2015-04-12 DIAGNOSIS — J9600 Acute respiratory failure, unspecified whether with hypoxia or hypercapnia: Secondary | ICD-10-CM | POA: Diagnosis present

## 2015-04-12 DIAGNOSIS — Y95 Nosocomial condition: Secondary | ICD-10-CM | POA: Diagnosis present

## 2015-04-12 DIAGNOSIS — E11649 Type 2 diabetes mellitus with hypoglycemia without coma: Secondary | ICD-10-CM | POA: Diagnosis present

## 2015-04-12 DIAGNOSIS — I509 Heart failure, unspecified: Secondary | ICD-10-CM

## 2015-04-12 DIAGNOSIS — R4702 Dysphasia: Secondary | ICD-10-CM | POA: Diagnosis present

## 2015-04-12 DIAGNOSIS — I120 Hypertensive chronic kidney disease with stage 5 chronic kidney disease or end stage renal disease: Secondary | ICD-10-CM | POA: Diagnosis present

## 2015-04-12 DIAGNOSIS — J189 Pneumonia, unspecified organism: Principal | ICD-10-CM | POA: Diagnosis present

## 2015-04-12 DIAGNOSIS — I4891 Unspecified atrial fibrillation: Secondary | ICD-10-CM | POA: Diagnosis present

## 2015-04-12 DIAGNOSIS — H409 Unspecified glaucoma: Secondary | ICD-10-CM | POA: Diagnosis present

## 2015-04-12 DIAGNOSIS — R531 Weakness: Secondary | ICD-10-CM | POA: Diagnosis not present

## 2015-04-12 DIAGNOSIS — I495 Sick sinus syndrome: Secondary | ICD-10-CM | POA: Diagnosis present

## 2015-04-12 DIAGNOSIS — Z888 Allergy status to other drugs, medicaments and biological substances status: Secondary | ICD-10-CM

## 2015-04-12 DIAGNOSIS — R7989 Other specified abnormal findings of blood chemistry: Secondary | ICD-10-CM | POA: Diagnosis not present

## 2015-04-12 DIAGNOSIS — D649 Anemia, unspecified: Secondary | ICD-10-CM | POA: Diagnosis present

## 2015-04-12 DIAGNOSIS — J9601 Acute respiratory failure with hypoxia: Secondary | ICD-10-CM | POA: Diagnosis not present

## 2015-04-12 DIAGNOSIS — Z7901 Long term (current) use of anticoagulants: Secondary | ICD-10-CM

## 2015-04-12 DIAGNOSIS — R609 Edema, unspecified: Secondary | ICD-10-CM | POA: Diagnosis present

## 2015-04-12 DIAGNOSIS — M109 Gout, unspecified: Secondary | ICD-10-CM | POA: Diagnosis present

## 2015-04-12 DIAGNOSIS — Z7984 Long term (current) use of oral hypoglycemic drugs: Secondary | ICD-10-CM

## 2015-04-12 DIAGNOSIS — Z79899 Other long term (current) drug therapy: Secondary | ICD-10-CM

## 2015-04-12 DIAGNOSIS — Z7982 Long term (current) use of aspirin: Secondary | ICD-10-CM

## 2015-04-12 DIAGNOSIS — I4892 Unspecified atrial flutter: Secondary | ICD-10-CM | POA: Diagnosis not present

## 2015-04-12 DIAGNOSIS — E1122 Type 2 diabetes mellitus with diabetic chronic kidney disease: Secondary | ICD-10-CM | POA: Diagnosis not present

## 2015-04-12 DIAGNOSIS — I10 Essential (primary) hypertension: Secondary | ICD-10-CM | POA: Diagnosis present

## 2015-04-12 DIAGNOSIS — D631 Anemia in chronic kidney disease: Secondary | ICD-10-CM | POA: Diagnosis present

## 2015-04-12 DIAGNOSIS — R778 Other specified abnormalities of plasma proteins: Secondary | ICD-10-CM | POA: Diagnosis present

## 2015-04-12 DIAGNOSIS — E1142 Type 2 diabetes mellitus with diabetic polyneuropathy: Secondary | ICD-10-CM | POA: Diagnosis present

## 2015-04-12 DIAGNOSIS — Z992 Dependence on renal dialysis: Secondary | ICD-10-CM

## 2015-04-12 DIAGNOSIS — N2581 Secondary hyperparathyroidism of renal origin: Secondary | ICD-10-CM | POA: Diagnosis present

## 2015-04-12 DIAGNOSIS — N186 End stage renal disease: Secondary | ICD-10-CM | POA: Diagnosis present

## 2015-04-12 LAB — GLUCOSE, CAPILLARY
GLUCOSE-CAPILLARY: 169 mg/dL — AB (ref 65–99)
GLUCOSE-CAPILLARY: 179 mg/dL — AB (ref 65–99)
Glucose-Capillary: 99 mg/dL (ref 65–99)

## 2015-04-12 LAB — CBC WITH DIFFERENTIAL/PLATELET
BASOS ABS: 0 10*3/uL (ref 0.0–0.1)
Basophils Relative: 0 %
Eosinophils Absolute: 0 10*3/uL (ref 0.0–0.7)
Eosinophils Relative: 0 %
HEMATOCRIT: 29.9 % — AB (ref 39.0–52.0)
HEMOGLOBIN: 9.6 g/dL — AB (ref 13.0–17.0)
LYMPHS PCT: 7 %
Lymphs Abs: 0.9 10*3/uL (ref 0.7–4.0)
MCH: 30.1 pg (ref 26.0–34.0)
MCHC: 32.1 g/dL (ref 30.0–36.0)
MCV: 93.7 fL (ref 78.0–100.0)
MONO ABS: 0.7 10*3/uL (ref 0.1–1.0)
MONOS PCT: 6 %
NEUTROS ABS: 10.4 10*3/uL — AB (ref 1.7–7.7)
NEUTROS PCT: 87 %
Platelets: 270 10*3/uL (ref 150–400)
RBC: 3.19 MIL/uL — ABNORMAL LOW (ref 4.22–5.81)
RDW: 15.3 % (ref 11.5–15.5)
WBC: 12.1 10*3/uL — ABNORMAL HIGH (ref 4.0–10.5)

## 2015-04-12 LAB — INFLUENZA PANEL BY PCR (TYPE A & B)
H1N1 flu by pcr: NOT DETECTED
Influenza A By PCR: NEGATIVE
Influenza B By PCR: NEGATIVE

## 2015-04-12 LAB — BASIC METABOLIC PANEL
ANION GAP: 19 — AB (ref 5–15)
BUN: 58 mg/dL — ABNORMAL HIGH (ref 6–20)
CHLORIDE: 96 mmol/L — AB (ref 101–111)
CO2: 26 mmol/L (ref 22–32)
Calcium: 10.1 mg/dL (ref 8.9–10.3)
Creatinine, Ser: 13.16 mg/dL — ABNORMAL HIGH (ref 0.61–1.24)
GFR calc non Af Amer: 3 mL/min — ABNORMAL LOW (ref >60)
GFR, EST AFRICAN AMERICAN: 4 mL/min — AB (ref >60)
Glucose, Bld: 135 mg/dL — ABNORMAL HIGH (ref 65–99)
Potassium: 4.4 mmol/L (ref 3.5–5.1)
SODIUM: 141 mmol/L (ref 135–145)

## 2015-04-12 LAB — GRAM STAIN

## 2015-04-12 LAB — I-STAT TROPONIN, ED: Troponin i, poc: 0.22 ng/mL (ref 0.00–0.08)

## 2015-04-12 LAB — PROTIME-INR
INR: 4.29 — ABNORMAL HIGH (ref 0.00–1.49)
Prothrombin Time: 40 seconds — ABNORMAL HIGH (ref 11.6–15.2)

## 2015-04-12 LAB — CBG MONITORING, ED
GLUCOSE-CAPILLARY: 118 mg/dL — AB (ref 65–99)
GLUCOSE-CAPILLARY: 128 mg/dL — AB (ref 65–99)

## 2015-04-12 LAB — I-STAT CG4 LACTIC ACID, ED: LACTIC ACID, VENOUS: 1.38 mmol/L (ref 0.5–2.0)

## 2015-04-12 LAB — TROPONIN I: Troponin I: 0.11 ng/mL — ABNORMAL HIGH (ref <0.031)

## 2015-04-12 MED ORDER — SODIUM CHLORIDE 0.9 % IV SOLN: 100.0000 mL | INTRAVENOUS | Status: DC | PRN

## 2015-04-12 MED ORDER — HYDRALAZINE HCL 20 MG/ML IJ SOLN: 5.0000 mg | INTRAMUSCULAR | Status: DC | PRN

## 2015-04-12 MED ORDER — DEXTROSE 5 % IV SOLN
2.0000 g | INTRAVENOUS | Status: DC
  Administered 2015-04-12 – 2015-04-16 (×3): 2 g via INTRAVENOUS
  Filled 2015-04-12 (×3): qty 2

## 2015-04-12 MED ORDER — SODIUM CHLORIDE 0.9% FLUSH
3.0000 mL | Freq: Two times a day (BID) | INTRAVENOUS | Status: DC
  Administered 2015-04-12 – 2015-04-17 (×8): 3 mL via INTRAVENOUS

## 2015-04-12 MED ORDER — ATROPINE SULFATE 1 % OP SOLN
1.0000 [drp] | Freq: Two times a day (BID) | OPHTHALMIC | Status: DC
  Administered 2015-04-12 – 2015-04-17 (×9): 1 [drp] via OPHTHALMIC
  Filled 2015-04-12: qty 2

## 2015-04-12 MED ORDER — AMIODARONE HCL 200 MG PO TABS
300.0000 mg | ORAL_TABLET | Freq: Every day | ORAL | Status: DC
  Administered 2015-04-12 – 2015-04-13 (×2): 300 mg via ORAL
  Filled 2015-04-12 (×3): qty 1

## 2015-04-12 MED ORDER — DEXTROSE 5 % IV SOLN: 1.0000 g | INTRAVENOUS | Status: DC

## 2015-04-12 MED ORDER — AMLODIPINE BESYLATE 10 MG PO TABS
10.0000 mg | ORAL_TABLET | Freq: Every day | ORAL | Status: DC
  Administered 2015-04-13 – 2015-04-17 (×4): 10 mg via ORAL
  Filled 2015-04-12 (×3): qty 1
  Filled 2015-04-12: qty 2
  Filled 2015-04-12 (×2): qty 1

## 2015-04-12 MED ORDER — DIALYVITE 3000 3 MG PO TABS: 1.0000 | ORAL_TABLET | Freq: Every day | ORAL | Status: DC

## 2015-04-12 MED ORDER — DEXTROSE 5 % IV SOLN
1.0000 g | INTRAVENOUS | Status: DC
  Filled 2015-04-12: qty 1

## 2015-04-12 MED ORDER — LIDOCAINE-PRILOCAINE 2.5-2.5 % EX CREA
1.0000 | TOPICAL_CREAM | CUTANEOUS | Status: DC | PRN
Start: 2015-04-12 — End: 2015-04-12

## 2015-04-12 MED ORDER — HEPARIN SODIUM (PORCINE) 1000 UNIT/ML DIALYSIS: 4200.0000 [IU] | Freq: Once | INTRAMUSCULAR | Status: DC

## 2015-04-12 MED ORDER — DEXTROSE 5 % IV SOLN
1.0000 g | Freq: Once | INTRAVENOUS | Status: AC
  Administered 2015-04-12: 1 g via INTRAVENOUS
  Filled 2015-04-12: qty 1

## 2015-04-12 MED ORDER — SIMVASTATIN 40 MG PO TABS
20.0000 mg | ORAL_TABLET | Freq: Every evening | ORAL | Status: DC
  Administered 2015-04-12 – 2015-04-15 (×4): 20 mg via ORAL
  Filled 2015-04-12 (×6): qty 1

## 2015-04-12 MED ORDER — VANCOMYCIN HCL 10 G IV SOLR
2500.0000 mg | Freq: Once | INTRAVENOUS | Status: AC
  Administered 2015-04-12: 2500 mg via INTRAVENOUS
  Filled 2015-04-12: qty 2500

## 2015-04-12 MED ORDER — IRBESARTAN 150 MG PO TABS
150.0000 mg | ORAL_TABLET | Freq: Every day | ORAL | Status: DC
  Administered 2015-04-13 – 2015-04-17 (×4): 150 mg via ORAL
  Filled 2015-04-12 (×10): qty 1

## 2015-04-12 MED ORDER — ONDANSETRON HCL 4 MG/2ML IJ SOLN: 4.0000 mg | Freq: Four times a day (QID) | INTRAMUSCULAR | Status: DC | PRN

## 2015-04-12 MED ORDER — CALCIUM ACETATE (PHOS BINDER) 667 MG PO CAPS
667.0000 mg | ORAL_CAPSULE | ORAL | Status: DC
  Administered 2015-04-12 – 2015-04-16 (×4): 667 mg via ORAL
  Filled 2015-04-12 (×2): qty 1

## 2015-04-12 MED ORDER — PENTAFLUOROPROP-TETRAFLUOROETH EX AERO: 1.0000 "application " | INHALATION_SPRAY | CUTANEOUS | Status: DC | PRN

## 2015-04-12 MED ORDER — ACETAMINOPHEN 325 MG PO TABS
650.0000 mg | ORAL_TABLET | Freq: Four times a day (QID) | ORAL | Status: DC | PRN
  Administered 2015-04-14: 650 mg via ORAL
  Filled 2015-04-12: qty 2

## 2015-04-12 MED ORDER — TOBRAMYCIN-DEXAMETHASONE 0.3-0.1 % OP SUSP
1.0000 [drp] | Freq: Four times a day (QID) | OPHTHALMIC | Status: DC
  Administered 2015-04-12 – 2015-04-17 (×17): 1 [drp] via OPHTHALMIC
  Filled 2015-04-12: qty 2.5

## 2015-04-12 MED ORDER — WARFARIN - PHARMACIST DOSING INPATIENT: Freq: Every day | Status: DC

## 2015-04-12 MED ORDER — CINACALCET HCL 30 MG PO TABS
60.0000 mg | ORAL_TABLET | Freq: Every day | ORAL | Status: DC
  Administered 2015-04-12 – 2015-04-16 (×5): 60 mg via ORAL
  Filled 2015-04-12 (×6): qty 2

## 2015-04-12 MED ORDER — ACETAMINOPHEN 650 MG RE SUPP: 650.0000 mg | Freq: Four times a day (QID) | RECTAL | Status: DC | PRN

## 2015-04-12 MED ORDER — CALCIUM ACETATE 667 MG PO TABS: 1.0000 | ORAL_TABLET | Freq: Three times a day (TID) | ORAL | Status: DC

## 2015-04-12 MED ORDER — LIDOCAINE HCL (PF) 1 % IJ SOLN: 5.0000 mL | INTRAMUSCULAR | Status: DC | PRN

## 2015-04-12 MED ORDER — VANCOMYCIN HCL IN DEXTROSE 1-5 GM/200ML-% IV SOLN
1000.0000 mg | INTRAVENOUS | Status: DC
  Administered 2015-04-14: 1000 mg via INTRAVENOUS
  Filled 2015-04-12: qty 200

## 2015-04-12 MED ORDER — CALCIUM ACETATE (PHOS BINDER) 667 MG PO CAPS
1334.0000 mg | ORAL_CAPSULE | Freq: Three times a day (TID) | ORAL | Status: DC
  Administered 2015-04-13 – 2015-04-17 (×11): 1334 mg via ORAL
  Filled 2015-04-12 (×13): qty 2

## 2015-04-12 MED ORDER — ALBUTEROL SULFATE (2.5 MG/3ML) 0.083% IN NEBU
2.5000 mg | INHALATION_SOLUTION | Freq: Four times a day (QID) | RESPIRATORY_TRACT | Status: DC
  Administered 2015-04-13 – 2015-04-15 (×5): 2.5 mg via RESPIRATORY_TRACT
  Filled 2015-04-12 (×8): qty 3

## 2015-04-12 MED ORDER — DOCUSATE SODIUM 100 MG PO CAPS
100.0000 mg | ORAL_CAPSULE | Freq: Two times a day (BID) | ORAL | Status: DC
  Administered 2015-04-12 – 2015-04-17 (×9): 100 mg via ORAL
  Filled 2015-04-12 (×9): qty 1

## 2015-04-12 MED ORDER — MORPHINE SULFATE (PF) 2 MG/ML IV SOLN: 1.0000 mg | INTRAVENOUS | Status: DC | PRN

## 2015-04-12 MED ORDER — INSULIN ASPART 100 UNIT/ML ~~LOC~~ SOLN
0.0000 [IU] | Freq: Three times a day (TID) | SUBCUTANEOUS | Status: DC
  Administered 2015-04-12 – 2015-04-13 (×2): 1 [IU] via SUBCUTANEOUS
  Filled 2015-04-12: qty 1

## 2015-04-12 MED ORDER — PREDNISOLONE ACETATE 1 % OP SUSP
1.0000 [drp] | Freq: Two times a day (BID) | OPHTHALMIC | Status: DC
  Administered 2015-04-12 – 2015-04-17 (×11): 1 [drp] via OPHTHALMIC
  Filled 2015-04-12: qty 1

## 2015-04-12 MED ORDER — LATANOPROST 0.005 % OP SOLN
1.0000 [drp] | Freq: Every day | OPHTHALMIC | Status: DC
  Administered 2015-04-12 – 2015-04-16 (×5): 1 [drp] via OPHTHALMIC
  Filled 2015-04-12: qty 2.5

## 2015-04-12 MED ORDER — HYDROCODONE-ACETAMINOPHEN 5-325 MG PO TABS: 1.0000 | ORAL_TABLET | ORAL | Status: DC | PRN

## 2015-04-12 MED ORDER — ASPIRIN EC 81 MG PO TBEC
81.0000 mg | DELAYED_RELEASE_TABLET | Freq: Every day | ORAL | Status: DC
  Administered 2015-04-13 – 2015-04-17 (×4): 81 mg via ORAL
  Filled 2015-04-12 (×5): qty 1

## 2015-04-12 MED ORDER — ADULT MULTIVITAMIN W/MINERALS CH
1.0000 | ORAL_TABLET | Freq: Every day | ORAL | Status: DC
  Administered 2015-04-13 – 2015-04-17 (×4): 1 via ORAL
  Filled 2015-04-12 (×4): qty 1

## 2015-04-12 MED ORDER — ONDANSETRON HCL 4 MG PO TABS
4.0000 mg | ORAL_TABLET | Freq: Four times a day (QID) | ORAL | Status: DC | PRN
Start: 2015-04-12 — End: 2015-04-17

## 2015-04-12 MED ORDER — ALBUTEROL SULFATE (2.5 MG/3ML) 0.083% IN NEBU
2.5000 mg | INHALATION_SOLUTION | RESPIRATORY_TRACT | Status: DC
  Administered 2015-04-12 (×3): 2.5 mg via RESPIRATORY_TRACT
  Filled 2015-04-12 (×3): qty 3

## 2015-04-12 MED ORDER — DORZOLAMIDE HCL-TIMOLOL MAL 2-0.5 % OP SOLN
1.0000 [drp] | Freq: Two times a day (BID) | OPHTHALMIC | Status: DC
  Administered 2015-04-12 – 2015-04-17 (×11): 1 [drp] via OPHTHALMIC
  Filled 2015-04-12: qty 10

## 2015-04-12 MED ORDER — DOXERCALCIFEROL 4 MCG/2ML IV SOLN
5.0000 ug | INTRAVENOUS | Status: DC
  Administered 2015-04-14 – 2015-04-16 (×2): 5 ug via INTRAVENOUS
  Filled 2015-04-12 (×2): qty 4

## 2015-04-12 MED ORDER — INSULIN ASPART 100 UNIT/ML ~~LOC~~ SOLN: 0.0000 [IU] | Freq: Every day | SUBCUTANEOUS | Status: DC

## 2015-04-12 MED ORDER — AMLODIPINE BESYLATE 5 MG PO TABS: 10.0000 mg | ORAL_TABLET | Freq: Every day | ORAL | Status: DC

## 2015-04-12 NOTE — ED Provider Notes (Signed)
CSN: [id number]     Arrival date & time 04/12/15  0857 History   First MD Initiated Contact with Patient 04/12/15 [phone]     Chief Complaint  Patient presents with  . Weakness     (Consider location/radiation/quality/duration/timing/severity/associated sxs/prior Treatment) HPI Comments: Patient with h/o ESRD on hemodialysis, atrial fib on coumadin, recent admission for atrial fib with RVR spontaneously converted prior to discharge, increased amiodarone for 1 week after d/c -- presents with c/o weakness in his legs, able to walk but slower and more difficult this morning. EMS visited last night 2/2 blood sugar in the 20's, treated patient and he was not transported to the hospital. Patient otherwise denies fever, URI symptoms, chest pain, cough, shortness of breath, abdominal pain or swelling, leg pain or swelling. Patient does make a small amount of urine. He is due for dialysis at a [facility] facility today. States that he has been taking his medications as prescribed.  The history is provided by the patient and medical records.    Past Medical History  Diagnosis Date  . Diabetes mellitus without complication (HCC)   . Atrial fibrillation (HCC)   . Hypertension   . Glaucoma   . Peripheral neuropathy (HCC)   . Gout   . Anemia   . ESRD (end stage renal disease) on dialysis [facility](HCC)     "MWF; [address]" (04/08/2015)   Past Surgical History  Procedure Laterality Date  . Ablation of dysrhythmic focus    . Sp av dialysis shunt access existing *l*     Family History  Problem Relation Age of Onset  . Alcoholism Brother   . Alcoholism Brother   . Other Mother     bowel obstruction  . Heart failure Father     fluid bluid up   Social History  Substance Use Topics  . Smoking status: Never Smoker   . Smokeless tobacco: Never Used  . Alcohol Use: No    Review of Systems  Constitutional: Negative for fever.  HENT: Negative for rhinorrhea and sore throat.   Eyes:  Negative for redness.  Respiratory: Positive for cough. Negative for shortness of breath.   Cardiovascular: Negative for chest pain and leg swelling.  Gastrointestinal: Negative for nausea, vomiting, abdominal pain and diarrhea.  Genitourinary: Negative for dysuria.  Musculoskeletal: Negative for myalgias.  Skin: Negative for rash.  Neurological: Positive for weakness. Negative for headaches.      Allergies  Diltiazem  Home Medications   Prior to Admission medications   Medication Sig Start Date End Date Taking? Authorizing Provider  acetaminophen (TYLENOL) 325 MG tablet Take 650 mg by mouth daily as needed for mild pain.    Historical Provider, MD  albuterol (PROVENTIL HFA;VENTOLIN HFA) 108 (90 Base) MCG/ACT inhaler Inhale 1-2 puffs into the lungs every 6 (six) hours as needed for wheezing or shortness of breath.  03/29/15 03/28/16  Historical Provider, MD  amiodarone (PACERONE) 200 MG tablet Take 1 tablet (200 mg total) by mouth at bedtime. 04/08/15   Brittainy Sherlynn CarbonM Simmons, PA-C  amLODipine (NORVASC) 10 MG tablet Take 10 mg by mouth at bedtime.  01/09/14   Historical Provider, MD  aspirin EC 81 MG tablet Take 81 mg by mouth daily.    Historical Provider, MD  atropine 1 % ophthalmic solution Place 1 drop into the left eye 2 times daily at 12 noon and 4 pm.    Historical Provider, MD  Calcium Acetate 667 MG TABS Take 1-2 capsules by  mouth 3 (three) times daily. 667 mg for snacks, 1334 mg for meals    Historical Provider, MD  docusate sodium (COLACE) 100 MG capsule Take 100 mg by mouth 2 (two) times daily.    Historical Provider, MD  dorzolamide-timolol (COSOPT) 22.3-6.8 MG/ML ophthalmic solution Place 1 drop into the right eye 2 (two) times daily.     Historical Provider, MD  folic acid-vitamin b complex-vitamin c-selenium-zinc (DIALYVITE) 3 MG TABS tablet Take 1 tablet by mouth daily.    Historical Provider, MD  glipiZIDE (GLUCOTROL) 10 MG tablet Take 10 mg by mouth 2 (two) times daily before  a meal.    Historical Provider, MD  lidocaine-prilocaine (EMLA) cream Apply 1 application topically as needed (apply to skin near dialysis port before dialysis).  08/31/14   Historical Provider, MD  SENSIPAR 60 MG tablet Take 60 mg by mouth at bedtime.  09/03/14   Historical Provider, MD  simvastatin (ZOCOR) 20 MG tablet Take 20 mg by mouth every evening.    Historical Provider, MD  TRAVATAN Z 0.004 % SOLN ophthalmic solution Place 1 drop into the right eye at bedtime.  08/30/14   Historical Provider, MD  valsartan (DIOVAN) 160 MG tablet Take 160 mg by mouth at bedtime.     Historical Provider, MD  warfarin (COUMADIN) 5 MG tablet Take as directed by coumadin clinic Patient taking differently: Take 7.5-10 mg by mouth daily at 6 PM. Takes  on thurs only Takes 7.5mg  on all other days 03/11/15   Lyn Records, MD   BP 161/87 mmHg  Pulse 79  Temp(Src) 99.2 F (37.3 C) (Oral)  Resp 21  SpO2 99%   Physical Exam  Constitutional: He appears well-developed and well-nourished.  HENT:  Head: Normocephalic and atraumatic.  Mouth/Throat: Mucous membranes are dry.  Eyes: Conjunctivae are normal. Right eye exhibits no discharge. Left eye exhibits no discharge.  Neck: Normal range of motion. Neck supple.  Cardiovascular: Normal rate, regular rhythm and normal heart sounds.   Pulmonary/Chest: Effort normal and breath sounds normal. No respiratory distress. He has no wheezes. He has no rales.  Abdominal: Soft. There is no tenderness. There is no rebound and no guarding.  Neurological: He is alert.  Skin: Skin is warm and dry.  L UE dialysis access  Psychiatric: He has a normal mood and affect.  Nursing note and vitals reviewed.   ED Course  Procedures (including critical care time) Labs Review Labs Reviewed  CBC WITH DIFFERENTIAL/PLATELET - Abnormal; Notable for the following:    WBC 12.1 (*)    RBC 3.19 (*)    Hemoglobin 9.6 (*)    HCT 29.9 (*)    Neutro Abs 10.4 (*)    All other components  within normal limits  BASIC METABOLIC PANEL - Abnormal; Notable for the following:    Chloride 96 (*)    Glucose, Bld 135 (*)    BUN 58 (*)    Creatinine, Ser 13.16 (*)    GFR calc non Af Amer 3 (*)    GFR calc Af Amer 4 (*)    Anion gap 19 (*)    All other components within normal limits  PROTIME-INR - Abnormal; Notable for the following:    Prothrombin Time 40.0 (*)    INR 4.29 (*)    All other components within normal limits  I-STAT TROPOININ, ED - Abnormal; Notable for the following:    Troponin i, poc 0.22 (*)    All other components within normal limits  CBG MONITORING, ED - Abnormal; Notable for the following:    Glucose-Capillary 118 (*)    All other components within normal limits  I-STAT CG4 LACTIC ACID, ED    Imaging Review Dg Chest 2 View  04/12/2015  CLINICAL DATA:  Shortness of breath and tachycardia EXAM: CHEST  2 VIEW COMPARISON:  July 10, 2014 FINDINGS: There is patchy airspace consolidation throughout much of the right lung, primarily in the right upper lobe anteriorly. Left lung is clear. Heart is upper normal in size with pulmonary vascularity within normal limits. No adenopathy. There are no apparent bone lesions. IMPRESSION: Extensive infiltrate on the right, most notably in the anterior segment right upper lobe. Left lung clear. No adenopathy evident. Followup PA and lateral chest radiographs recommended in 3-4 weeks following trial of antibiotic therapy to ensure resolution and exclude underlying malignancy. Electronically Signed   By: Bretta Bang III M.D.   On: 04/12/2015 09:55   I have personally reviewed and evaluated these images and lab results as part of my medical decision-making.   EKG Interpretation   Date/Time:  Monday April 12 2015 09:54:06 EDT Ventricular Rate:  81 PR Interval:  56 QRS Duration: 90 QT Interval:  409 QTC Calculation: 475 R Axis:   69 Text Interpretation:  Sinus rhythm Atrial premature complexes Short PR  interval  Confirmed by Ranae Palms  MD, DAVID ([id number]) on 04/12/2015 10:27:54  AM       9:02 AM Patient seen and examined. HR nml. O2 say in upper 80's. Work-up initiated. Medications ordered. Discussed with and seen by Dr. Ranae Palms.   Vital signs reviewed and are as follows: BP 161/87 mmHg  Pulse 79  Temp(Src) 99.2 F (37.3 C) (Oral)  Resp 21  SpO2 99%  11:20 AM Treatment initiated for HCAP. Patient updated on results.   Spoke with Toya Smothers NP who will see and admit.   11:35 AM Spoke with Dr. Lowell Guitar who will arrange for hemodialysis.   MDM   Final diagnoses:  HCAP (healthcare-associated pneumonia)  ESRD (end stage renal disease) (HCC)  Acute respiratory failure with hypoxia (HCC)   Admit.     Renne Crigler, PA-C 04/12/15 1135  Loren Racer, MD 04/21/15 [phone]

## 2015-04-12 NOTE — ED Notes (Signed)
Pt returned to room from xray.

## 2015-04-12 NOTE — ED Notes (Signed)
NP Toya SmothersKaren Black advised patient may eat at this time.

## 2015-04-12 NOTE — Progress Notes (Addendum)
Hemodialysis- Pt tolerated treatment well. Total UF 3L without issue. Pt remains weak, sats 96-100% on 2L 02. No complaints. Alert and oriented x3. Report given to 6E. Pt transported in stable condition.   Left message with reception in ED to notify pts wife of location when she returns as she does not have a cell phone.

## 2015-04-12 NOTE — ED Notes (Signed)
Pt called EMS for feeling weak in his knees and legs. Pt states he uses cane and walker to get around and has been feeling weak since Friday. Pt had dialysis on Friday and will need it today. Pt SOB when EMS arrived, sats 87% on room air. Improved to 97% on 2L [place]. CBG 179, Afib HR 88 (with hx of Afib), BP 160/100. EMS was called last night for low CBG and he was given D50 and not transported.

## 2015-04-12 NOTE — Progress Notes (Signed)
This patient is a 82 y.o. male with past medical history that includes diabetes, A. Fib on coumadin, hypertension, end-stage renal disease on Monday Wednesday Friday dialysis. He dialyzes at [facility] MWF, 4hrs, EDW 104kg, 3k, 2.25Ca; Hectorol 5, Micera 38mg Q 2 weeks, Hep 4200U.  He presented to the emergency department with complaint of generalized weakness and inability to ambulate.   In the emergency department he had a temperature of 99.2, HR 120 and oxygen saturation level 87% on room air at rest.  CXR reveals RUL infiltrate.  He states he was in his usual state of health until yesterday he gradually felt generalized weakness. He typically uses a walker for ambulation and this morning he awakened and was unable to stand. Denies any headache dizziness syncope or near-syncope. He denies a chest pain palpitations shortness of breath abdominal pain nausea vomiting diarrhea. He denies any fever chills.   Past Medical History  Diagnosis Date  . [place] complication ([place]   . Atrial fibrillation ([place]   . Hypertension   . Glaucoma   . Peripheral neuropathy ([place]   . Gout   . Anemia   . ESRD (end stage renal disease) on dialysis ([facility]     "[place]WF[place] (04[place]   Past Surgical History  Procedure Laterality Date  . Ablation of dysrhythmic focus    . Sp av dialysis shunt access existing *l*     Social History:  reports that he has never smoked. He has never used smokeless tobacco. He reports that he does not drink alcohol or use illicit drugs. Allergies:  Allergies  Allergen Reactions  . Diltiazem Other (See Comments)    SEVERE STOMACH CRAMPING PER PT   Family History  Problem Relation Age of Onset  . Alcoholism Brother   . Alcoholism Brother   . Other Mother     bowel obstruction  . Heart failure Father     fluid bluid up    Medications:  Scheduled: . albuterol  2.5 mg Nebulization Q4H  . amiodarone  300 mg Oral QHS  . amLODipine  10 mg Oral Daily   . aspirin EC  81 mg Oral Daily  . atropine  1 drop Left Eye q12n4p  . Calcium Acetate  1-2 capsule Oral TID  . ceFEPime (MAXIPIME) IV  2 g Intravenous Q M,W,F-2000  . cinacalcet  60 mg Oral QHS  . docusate sodium  100 mg Oral BID  . dorzolamide-timolol  1 drop Right Eye BID  . [START ON 04/14/2015] doxercalciferol  5 mcg Intravenous Q M,W,F-HD  . insulin aspart  0-5 Units Subcutaneous QHS  . insulin aspart  0-9 Units Subcutaneous TID WC  . irbesartan  150 mg Oral Daily  . latanoprost  1 drop Left Eye QHS  . [START ON 04/13/2015] multivitamin with minerals  1 tablet Oral Daily  . prednisoLONE acetate  1 drop Left Eye BID  . simvastatin  20 mg Oral QPM  . sodium chloride flush  3 mL Intravenous Q12H  . tobramycin-dexamethasone  1 drop Left Eye QID  . vancomycin  2,500 mg Intravenous Once  . [START ON 04/14/2015] vancomycin  1,000 mg Intravenous Q M,W,F-HD  . [START ON 04/13/2015] Warfarin - Pharmacist Dosing Inpatient   Does not apply q1800   ROS: as per HPI  Blood pressure 164/97, pulse 72, temperature 99.1 F (37.3 C), temperature source Oral, resp. rate 22, weight 103.5 kg (228 lb 2.8 oz), SpO2 96 %.  General appearance: alert  and cooperative Head: Normocephalic, without obvious abnormality, atraumatic Eyes: blind OD., conjuntival injection OD Ears: normal TM's and external ear canals both ears Nose: Nares normal. Septum midline. Mucosa normal. No drainage or sinus tenderness. Throat: edent Resp: clear to auscultation bilaterally Chest wall: no tenderness Cardio: regular rate and rhythm, S1, S2 normal, no murmur, click, rub or gallop GI: soft, non-tender; bowel sounds normal; no masses,  no organomegaly Extremities: extremities normal, atraumatic, no cyanosis or edema; LUE AVF with hypopigmentation Neurologic: Grossly normal Results for orders placed or performed during the hospital encounter of 04/12/15 (from the past 48 hour(s))  CBG monitoring, ED     Status: Abnormal    Collection Time: 04/12/15 10:00 AM  Result Value Ref Range   Glucose-Capillary 118 (H) 65 - 99 mg/dL  CBC with Differential/Platelet     Status: Abnormal   Collection Time: 04/12/15 10:01 AM  Result Value Ref Range   WBC 12.1 (H) 4.0 - 10.5 K/uL   RBC 3.19 (L) 4.22 - 5.81 MIL/uL   Hemoglobin 9.6 (L) 13.0 - 17.0 g/dL   HCT 29.9 (L) 39.0 - 52.0 %   MCV 93.7 78.0 - 100.0 fL   MCH 30.1 26.0 - 34.0 pg   MCHC 32.1 30.0 - 36.0 g/dL   RDW 15.3 11.5 - 15.5 %   Platelets 270 150 - 400 K/uL   Neutrophils Relative % 87 %   Neutro Abs 10.4 (H) 1.7 - 7.7 K/uL   Lymphocytes Relative 7 %   Lymphs Abs 0.9 0.7 - 4.0 K/uL   Monocytes Relative 6 %   Monocytes Absolute 0.7 0.1 - 1.0 K/uL   Eosinophils Relative 0 %   Eosinophils Absolute 0.0 0.0 - 0.7 K/uL   Basophils Relative 0 %   Basophils Absolute 0.0 0.0 - 0.1 K/uL  Basic metabolic panel     Status: Abnormal   Collection Time: 04/12/15 10:01 AM  Result Value Ref Range   Sodium 141 135 - 145 mmol/L   Potassium 4.4 3.5 - 5.1 mmol/L   Chloride 96 (L) 101 - 111 mmol/L   CO2 26 22 - 32 mmol/L   Glucose, Bld 135 (H) 65 - 99 mg/dL   BUN 58 (H) 6 - 20 mg/dL   Creatinine, Ser 13.16 (H) 0.61 - 1.24 mg/dL   Calcium 10.1 8.9 - 10.3 mg/dL   GFR calc non Af Amer 3 (L) >60 mL/min   GFR calc Af Amer 4 (L) >60 mL/min    Comment: (NOTE) The eGFR has been calculated using the CKD EPI equation. This calculation has not been validated in all clinical situations. eGFR's persistently <60 mL/min signify possible Chronic Kidney Disease.    Anion gap 19 (H) 5 - 15  Protime-INR     Status: Abnormal   Collection Time: 04/12/15 10:01 AM  Result Value Ref Range   Prothrombin Time 40.0 (H) 11.6 - 15.2 seconds   INR 4.29 (H) 0.00 - 1.49  I-stat troponin, ED     Status: Abnormal   Collection Time: 04/12/15 10:08 AM  Result Value Ref Range   Troponin i, poc 0.22 (HH) 0.00 - 0.08 ng/mL   Comment NOTIFIED PHYSICIAN    Comment 3            Comment: Due to the  release kinetics of cTnI, a negative result within the first hours of the onset of symptoms does not rule out myocardial infarction with certainty. If myocardial infarction is still suspected, repeat the test at appropriate intervals.  I-Stat CG4 Lactic Acid, ED     Status: None   Collection Time: 04/12/15 10:18 AM  Result Value Ref Range   Lactic Acid, Venous 1.38 0.5 - 2.0 mmol/L  CBG monitoring, ED     Status: Abnormal   Collection Time: 04/12/15 11:56 AM  Result Value Ref Range   Glucose-Capillary 128 (H) 65 - 99 mg/dL  Influenza panel by pcr     Status: None   Collection Time: 04/12/15 12:04 PM  Result Value Ref Range   Influenza A By PCR NEGATIVE NEGATIVE   Influenza B By PCR NEGATIVE NEGATIVE   H1N1 flu by pcr NOT DETECTED NOT DETECTED    Comment:        The Xpert Flu assay (FDA approved for nasal aspirates or washes and nasopharyngeal swab specimens), is intended as an aid in the diagnosis of influenza and should not be used as a sole basis for treatment.   Gram stain     Status: None   Collection Time: 04/12/15  1:55 PM  Result Value Ref Range   Specimen Description TRACHEAL SITE    Special Requests NONE    Gram Stain      FEW WBC PRESENT,BOTH PMN AND MONONUCLEAR MODERATE GRAM POSITIVE RODS FEW GRAM POSITIVE COCCI IN PAIRS FEW GRAM NEGATIVE RODS SQUAMOUS EPITHELIAL CELLS PRESENT    Report Status 04/12/2015 FINAL    Dg Chest 2 View  04/12/2015  CLINICAL DATA:  Shortness of breath and tachycardia EXAM: CHEST  2 VIEW COMPARISON:  July 10, 2014 FINDINGS: There is patchy airspace consolidation throughout much of the right lung, primarily in the right upper lobe anteriorly. Left lung is clear. Heart is upper normal in size with pulmonary vascularity within normal limits. No adenopathy. There are no apparent bone lesions. IMPRESSION: Extensive infiltrate on the right, most notably in the anterior segment right upper lobe. Left lung clear. No adenopathy evident. Followup  PA and lateral chest radiographs recommended in 3-4 weeks following trial of antibiotic therapy to ensure resolution and exclude underlying malignancy. Electronically Signed   By: Lowella Grip III M.D.   On: 04/12/2015 09:55    Assessment:  1 ESRD 2 Pneumonia Plan: 1 Dialysis 2 Antibiotics per IM Nazir Hacker C 04/12/2015, 4:00 PM

## 2015-04-12 NOTE — ED Notes (Signed)
Pt transported to xray 

## 2015-04-12 NOTE — ED Notes (Signed)
DR. Konrad Doloresmerrell at bedside, Dr. Konrad DoloresMerrell made aware of swallow eval order as well as cardiac diet order, this RN requesting md to advise if pt can eat or needs to be NPO. Dr. Konrad Doloresmerrell advised he would speak with NP karen black and call this RN in regards to this

## 2015-04-12 NOTE — Procedures (Signed)
Tolerating hemodialysis without hemodynamic issues at this time. [name] [name] C

## 2015-04-12 NOTE — ED Notes (Signed)
Patient transported to X-ray 

## 2015-04-12 NOTE — Progress Notes (Addendum)
ANTICOAGULATION CONSULT NOTE - Initial Consult  Pharmacy Consult for warfarin Indication: atrial fibrillation  Allergies  Allergen Reactions  . Diltiazem Other (See Comments)    SEVERE STOMACH CRAMPING PER PT    Patient Measurements:     Vital Signs: Temp: 99.2 F (37.3 C) (03/13 0907) Temp Source: Oral (03/13 0907) BP: 193/94 mmHg (03/13 1130) Pulse Rate: 79 (03/13 1130)  Labs:  Recent Labs  04/12/15 1001  HGB 9.6*  HCT 29.9*  PLT 270  LABPROT 40.0*  INR 4.29*  CREATININE 13.16*    Estimated Creatinine Clearance: 5.6 mL/min (by C-G formula based on Cr of 13.16).   Medical History: Past Medical History  Diagnosis Date  . Diabetes mellitus without complication (HCC)   . Atrial fibrillation (HCC)   . Hypertension   . Glaucoma   . Peripheral neuropathy (HCC)   . Gout   . Anemia   . ESRD (end stage renal disease) on dialysis [facility](HCC)     "MWF; [address]" (04/08/2015)    Assessment: [age] yom with afib on warfarin pta. Pharmacy consulted to continue dosing inpatient. INR supratherapeutic 4.29 on admit (last dose pta 3/12). Hg low 9.6, plt wnl. No bleed documented.   PTA dose: 10mg  on Thurs, 7.5mg  all other days  Goal of Therapy:  INR 2-3 Monitor platelets by anticoagulation protocol: Yes   Plan:  Hold warfarin tonight Daily INR Monitor s/sx bleeding  Babs BertinHaley Lexis Potenza, PharmD, BCPS Clinical Pharmacist Pager [phone] 04/12/2015 12:01 PM

## 2015-04-12 NOTE — H&P (Signed)
[facility] History and Physical  [name] [name] ZOX:[id number] DOB: May 29, 1933 DOA: 04/12/2015  Referring physician: Emmit Alexanders PCP: Katy Apo, MD   Chief Complaint: generalized weakness  HPI: [name] [name] is a 82 y.o. male with past medical history that includes diabetes, A. Fib on coumadin, hypertension, end-stage renal disease on Monday Wednesday Friday dialysis patient presents to the emergency department with chief complaint of generalized weakness and inability to ambulate. Initial evaluation in the emergency department reveals pneumonia.  Information is obtained from the patient and the chart. He states he recently was discharged from the hospital after a brief stay for A. fib with RVR. He states he was in his usual state of health until yesterday he gradually felt generalized weakness. He typically uses a walker for ambulation and this morning he awakened and was unable to bear weight. He states his legs "just wouldn't hold me". Denies any headache dizziness syncope or near-syncope. He denies a chest pain palpitations shortness of breath abdominal pain nausea vomiting diarrhea. He denies any fever chills.   In the emergency department he has a temperature of 99.2 tachycardia at 120 and oxygen saturation level 87% on room air at rest.   Review of Systems:  10 point review of systems complete and all systems are negative except as indicated in the history of present illness  Past Medical History  Diagnosis Date  . Diabetes mellitus without complication (HCC)   . Atrial fibrillation (HCC)   . Hypertension   . Glaucoma   . Peripheral neuropathy (HCC)   . Gout   . Anemia   . ESRD (end stage renal disease) on dialysis [facility])     "MWF; [address]" (04/08/2015)   Past Surgical History  Procedure Laterality Date  . Ablation of dysrhythmic focus    . Sp av dialysis shunt access existing *l*     Social History:  reports that he has never smoked. He has never used  smokeless tobacco. He reports that he does not drink alcohol or use illicit drugs. 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 Diltiazem Other (See Comments)    SEVERE STOMACH CRAMPING PER PT    Family History  Problem Relation Age of Onset  . Alcoholism Brother   . Alcoholism Brother   . Other Mother     bowel obstruction  . Heart failure Father     fluid bluid up     Prior to Admission medications   Medication Sig Start Date End Date Taking? Authorizing Provider  acetaminophen (TYLENOL) 325 MG tablet Take 650 mg by mouth daily as needed for mild pain.   Yes Historical Provider, MD  albuterol (PROVENTIL HFA;VENTOLIN HFA) 108 (90 Base) MCG/ACT inhaler Inhale 1-2 puffs into the lungs every 6 (six) hours as needed for wheezing or shortness of breath.  03/29/15 03/28/16 Yes Historical Provider, MD  amiodarone (PACERONE) 200 MG tablet Take 1 tablet (200 mg total) by mouth at bedtime. Patient taking differently: Take 300 mg by mouth at bedtime.  04/08/15  Yes Brittainy Sherlynn Carbon, PA-C  amLODipine (NORVASC) 10 MG tablet Take 10 mg by mouth at bedtime.  01/09/14  Yes Historical Provider, MD  aspirin EC 81 MG tablet Take 81 mg by mouth daily.   Yes Historical Provider, MD  atropine 1 % ophthalmic solution Place 1 drop into the left eye 2 times daily  at 12 noon and 4 pm.   Yes Historical Provider, MD  Calcium Acetate 667 MG TABS Take 1-2 capsules by mouth 3 (three) times daily. 667 mg for snacks, 1334 mg for meals   Yes Historical Provider, MD  docusate sodium (COLACE) 100 MG capsule Take 100 mg by mouth 2 (two) times daily.   Yes Historical Provider, MD  dorzolamide-timolol (COSOPT) 22.3-6.8 MG/ML ophthalmic solution Place 1 drop into the right eye 2 (two) times daily.    Yes Historical Provider, MD  folic acid-vitamin b complex-vitamin c-selenium-zinc (DIALYVITE) 3 MG TABS tablet  Take 1 tablet by mouth daily.   Yes Historical Provider, MD  glipiZIDE (GLUCOTROL) 10 MG tablet Take 10 mg by mouth 2 (two) times daily before a meal.   Yes Historical Provider, MD  lidocaine-prilocaine (EMLA) cream Apply 1 application topically as needed (apply to skin near dialysis port before dialysis).  08/31/14  Yes Historical Provider, MD  SENSIPAR 60 MG tablet Take 60 mg by mouth at bedtime.  09/03/14  Yes Historical Provider, MD  simvastatin (ZOCOR) 20 MG tablet Take 20 mg by mouth every evening.   Yes Historical Provider, MD  TRAVATAN Z 0.004 % SOLN ophthalmic solution Place 1 drop into the right eye at bedtime.  08/30/14  Yes Historical Provider, MD  valsartan (DIOVAN) 160 MG tablet Take 160 mg by mouth at bedtime.    Yes Historical Provider, MD  warfarin (COUMADIN) 5 MG tablet Take as directed by coumadin clinic Patient taking differently: Take 7.5-10 mg by mouth daily at 6 PM. Takes  on thurs only Takes 7.5mg  on all other days 03/11/15  Yes Lyn Records, MD   Physical Exam: Filed Vitals:   04/12/15 0908 04/12/15 0910 04/12/15 1000 04/12/15 1100  BP:  200/106 190/92 161/87  Pulse:  85 80 79  Temp:      TempSrc:      Resp:   19 21  SpO2: 97% 98% 100% 99%    Wt Readings from Last 3 Encounters:  04/08/15 103.3 kg (227 lb 11.8 oz)  04/01/15 102.059 kg (225 lb)  12/08/14 105.371 kg (232 lb 4.8 oz)    General:  Appears calm and comfortable Eyes:  normal lids, irises & conjunctiva Left eye ENT: grossly normal hearing, lips & tongue Is membranes of his mouth are dry Neck: no LAD, masses or thyromegaly Cardiovascular: RRR, no m/r/g. No LE edema. Telemetry: SR, no arrhythmias  Respiratory: Mild increased work of breathing with conversation. Breath sounds are quite coarse throughout no crackles fine wheezes Abdomen: soft, ntnd positive bowel sounds Skin: no rash or induration seen on limited exam Musculoskeletal: grossly normal tone BUE/BLE Psychiatric: grossly normal mood and  affect, speech fluent and appropriate Neurologic: grossly non-focal. Beach clear           Labs on Admission:  Basic Metabolic Panel:  Recent Labs Lab 04/07/15 1605 04/08/15 0404 04/12/15 1001  NA 138 140 141  K 3.0* 3.6 4.4  CL 95* 95* 96*  CO2 GLUCOSE 204* 144* 135*  BUN 15 22* 58*  CREATININE 5.68* 7.74* 13.16*  CALCIUM 8.6* 8.4* 10.1   Liver Function Tests:  Recent Labs Lab 04/07/15 1605  AST 30  ALT 32  ALKPHOS 59  BILITOT 0.8  PROT 8.3*  ALBUMIN 3.0*   No results for input(s): LIPASE, AMYLASE in the last 168 hours. No results for input(s): AMMONIA in the last 168 hours. CBC:  Recent Labs Lab 04/07/15 1605 04/08/15 0404  04/12/15 1001  WBC 7.7 7.5 12.1*  NEUTROABS 5.2  --  10.4*  HGB 10.1* 8.9* 9.6*  HCT 30.3* 26.5* 29.9*  MCV 92.9 93.6 93.7  PLT 207 169 270   Cardiac Enzymes: No results for input(s): CKTOTAL, CKMB, CKMBINDEX, TROPONINI in the last 168 hours.  BNP (last 3 results) No results for input(s): BNP in the last 8760 hours.  ProBNP (last 3 results) No results for input(s): PROBNP in the last 8760 hours.  CBG:  Recent Labs Lab 04/08/15 0056 04/08/15 0742 04/08/15 1111 04/08/15 1608 04/12/15 1000  GLUCAP 143* 141* 169* 131* 118*    Radiological Exams on Admission: Dg Chest 2 View  04/12/2015  CLINICAL DATA:  Shortness of breath and tachycardia EXAM: CHEST  2 VIEW COMPARISON:  July 10, 2014 FINDINGS: There is patchy airspace consolidation throughout much of the right lung, primarily in the right upper lobe anteriorly. Left lung is clear. Heart is upper normal in size with pulmonary vascularity within normal limits. No adenopathy. There are no apparent bone lesions. IMPRESSION: Extensive infiltrate on the right, most notably in the anterior segment right upper lobe. Left lung clear. No adenopathy evident. Followup PA and lateral chest radiographs recommended in 3-4 weeks following trial of antibiotic therapy to ensure  resolution and exclude underlying malignancy. Electronically Signed   By: Bretta BangWilliam  Woodruff III M.D.   On: 04/12/2015 09:55    EKG: Independently reviewed. Sinus rhythm Atrial premature complexes Short PR interval  Assessment/Plan Principal Problem:   Acute respiratory failure (HCC) Active Problems:   Hypertension   Diabetes mellitus with ESRD (end-stage renal disease) (HCC)   ESRD (end stage renal disease) (HCC)   Sinus bradycardia-tachycardia syndrome (HCC)   HCAP (healthcare-associated pneumonia)   Elevated troponin  #1. Acute respiratory failure with hypoxia likely secondary to healthcare associated pneumonia per chest x-ray. Oxygen saturation level 87% on room air at rest. No chest pain. -Admit to telemetry -We'll provide vancomycin and cefepime -Obtain influenza panel - sputum Culture as able -Oxygen supplementation -Nebulizers every 4 hours -Wean oxygen as able  #2. Healthcare associated pneumonia. Per chest x-ray. See #1. Lactic acid within the limits of normal. Temperature 99.2. -Antibiotics as noted above -Blood cultures pending -speech therapy to evaluate for silent aspiration -physical therapy   #3. Elevated troponin. Likely related to #1 the setting of end-stage renal disease. Denies chest pain. Troponin 0.22. KG without acute changes -We'll cycle troponins -Monitor on telemetry  4. End-stage renal disease. He is a Monday Wednesday Friday dialysis patient. Creatinine 13.6 on admission -Nephrology consult  #5. Diabetes. On oral agents at home. Serum glucose 135 on admission. -Obtain hemoglobin A1c -Sliding scale insulin for optimal control  #6. History of A. Fib. Chadvasc score 5.  EKG shows normal sinus rhythm. Home medications include Coumadin and amiodarone. creatinine 4.26 on admission -Monitor on telemetry -Into home meds  #7. Hypertension. Controlled. -Continue home meds  #8. Anemia. Likely related to chronic disease. Hemoglobin 9.6 on admission.  Chart review indicates this is close to his baseline. No signs of active bleeding     Code Status: limited DVT Prophylaxis: Family Communication: none present Disposition Plan: home hopefully 2 days  Time spent: 55 minutes  Ascension Providence Rochester HospitalBLACK,Jaquayla Hege M [facility]

## 2015-04-12 NOTE — Progress Notes (Signed)
Pharmacy Antibiotic Note  [name] [name] is a 82 y.o. male admitted on 04/12/2015 with pneumonia.  Pharmacy has been consulted for vancomycin dosing. Afeb, other labs pending. ESRD on HD - due today, still makes small amount of urine.  Plan: Vanc 2500mg  IV x1 Cefepime 1g IV x1 per MD Monitor clinical progress, c/s, renal function, abx plan/LOT Pre-HD vanc level as indicated F/u HD schedule inpatient - abx maintenance doses     Temp (24hrs), Avg:99.2 F (37.3 C), Min:99.2 F (37.3 C), Max:99.2 F (37.3 C)   Recent Labs Lab 04/07/15 1605 04/08/15 0404  WBC 7.7 7.5  CREATININE 5.68* 7.74*    Estimated Creatinine Clearance: 9.5 mL/min (by C-G formula based on Cr of 7.74).    Allergies  Allergen Reactions  . Diltiazem Other (See Comments)    SEVERE STOMACH CRAMPING PER PT    Antimicrobials this admission: 3/13 vanc >>  3/13 cefepime x 1  Dose adjustments this admission: n/a  Microbiology results:   [name] [name], PharmD, [facility] Regional Medical CenterBCPS Clinical Pharmacist Pager [phone] 04/12/2015 10:20 AM

## 2015-04-12 NOTE — ED Notes (Signed)
Page placed to karen black np in regards to orders for cardiac diet and swallow eval, this RN requesting clarification on whether pt needs to remain NPO, this RN waiting for providers response at this time

## 2015-04-12 NOTE — Progress Notes (Signed)
Patient arrived on unit via stretcher from hemodialysis.  No family at bedside.  Telemetry placed per MD order and CMT notified.  

## 2015-04-12 NOTE — ED Notes (Addendum)
CBG 118 

## 2015-04-13 ENCOUNTER — Observation Stay (HOSPITAL_COMMUNITY): Payer: Medicare Other

## 2015-04-13 DIAGNOSIS — N2581 Secondary hyperparathyroidism of renal origin: Secondary | ICD-10-CM | POA: Diagnosis present

## 2015-04-13 DIAGNOSIS — Z992 Dependence on renal dialysis: Secondary | ICD-10-CM | POA: Diagnosis not present

## 2015-04-13 DIAGNOSIS — R531 Weakness: Secondary | ICD-10-CM | POA: Diagnosis present

## 2015-04-13 DIAGNOSIS — R7989 Other specified abnormal findings of blood chemistry: Secondary | ICD-10-CM | POA: Diagnosis not present

## 2015-04-13 DIAGNOSIS — J9601 Acute respiratory failure with hypoxia: Secondary | ICD-10-CM | POA: Diagnosis present

## 2015-04-13 DIAGNOSIS — Z7984 Long term (current) use of oral hypoglycemic drugs: Secondary | ICD-10-CM | POA: Diagnosis not present

## 2015-04-13 DIAGNOSIS — Z888 Allergy status to other drugs, medicaments and biological substances status: Secondary | ICD-10-CM | POA: Diagnosis not present

## 2015-04-13 DIAGNOSIS — Z7982 Long term (current) use of aspirin: Secondary | ICD-10-CM | POA: Diagnosis not present

## 2015-04-13 DIAGNOSIS — Z7901 Long term (current) use of anticoagulants: Secondary | ICD-10-CM | POA: Diagnosis not present

## 2015-04-13 DIAGNOSIS — Y95 Nosocomial condition: Secondary | ICD-10-CM | POA: Diagnosis present

## 2015-04-13 DIAGNOSIS — Z79899 Other long term (current) drug therapy: Secondary | ICD-10-CM | POA: Diagnosis not present

## 2015-04-13 DIAGNOSIS — I120 Hypertensive chronic kidney disease with stage 5 chronic kidney disease or end stage renal disease: Secondary | ICD-10-CM | POA: Diagnosis present

## 2015-04-13 DIAGNOSIS — I4892 Unspecified atrial flutter: Secondary | ICD-10-CM | POA: Diagnosis not present

## 2015-04-13 DIAGNOSIS — R778 Other specified abnormalities of plasma proteins: Secondary | ICD-10-CM | POA: Diagnosis present

## 2015-04-13 DIAGNOSIS — R4702 Dysphasia: Secondary | ICD-10-CM | POA: Diagnosis present

## 2015-04-13 DIAGNOSIS — J189 Pneumonia, unspecified organism: Secondary | ICD-10-CM | POA: Diagnosis present

## 2015-04-13 DIAGNOSIS — M109 Gout, unspecified: Secondary | ICD-10-CM | POA: Diagnosis present

## 2015-04-13 DIAGNOSIS — I495 Sick sinus syndrome: Secondary | ICD-10-CM | POA: Diagnosis present

## 2015-04-13 DIAGNOSIS — E1122 Type 2 diabetes mellitus with diabetic chronic kidney disease: Secondary | ICD-10-CM | POA: Diagnosis present

## 2015-04-13 DIAGNOSIS — E1142 Type 2 diabetes mellitus with diabetic polyneuropathy: Secondary | ICD-10-CM | POA: Diagnosis present

## 2015-04-13 DIAGNOSIS — I4891 Unspecified atrial fibrillation: Secondary | ICD-10-CM | POA: Diagnosis present

## 2015-04-13 DIAGNOSIS — N186 End stage renal disease: Secondary | ICD-10-CM | POA: Diagnosis present

## 2015-04-13 DIAGNOSIS — E11649 Type 2 diabetes mellitus with hypoglycemia without coma: Secondary | ICD-10-CM | POA: Diagnosis present

## 2015-04-13 DIAGNOSIS — D631 Anemia in chronic kidney disease: Secondary | ICD-10-CM | POA: Diagnosis present

## 2015-04-13 DIAGNOSIS — H409 Unspecified glaucoma: Secondary | ICD-10-CM | POA: Diagnosis present

## 2015-04-13 DIAGNOSIS — R609 Edema, unspecified: Secondary | ICD-10-CM | POA: Diagnosis present

## 2015-04-13 LAB — CBC
HEMATOCRIT: 26.2 % — AB (ref 39.0–52.0)
HEMOGLOBIN: 8.2 g/dL — AB (ref 13.0–17.0)
MCH: 29.5 pg (ref 26.0–34.0)
MCHC: 31.3 g/dL (ref 30.0–36.0)
MCV: 94.2 fL (ref 78.0–100.0)
Platelets: 261 10*3/uL (ref 150–400)
RBC: 2.78 MIL/uL — ABNORMAL LOW (ref 4.22–5.81)
RDW: 15.3 % (ref 11.5–15.5)
WBC: 8.6 10*3/uL (ref 4.0–10.5)

## 2015-04-13 LAB — HEMOGLOBIN A1C
HEMOGLOBIN A1C: 6.3 % — AB (ref 4.8–5.6)
MEAN PLASMA GLUCOSE: 134 mg/dL

## 2015-04-13 LAB — GLUCOSE, CAPILLARY
GLUCOSE-CAPILLARY: 72 mg/dL (ref 65–99)
GLUCOSE-CAPILLARY: 78 mg/dL (ref 65–99)
GLUCOSE-CAPILLARY: 98 mg/dL (ref 65–99)
GLUCOSE-CAPILLARY: 35 mg/dL — AB (ref 65–99)
Glucose-Capillary: 42 mg/dL — CL (ref 65–99)
Glucose-Capillary: 122 mg/dL — ABNORMAL HIGH (ref 65–99)
Glucose-Capillary: 117 mg/dL — ABNORMAL HIGH (ref 65–99)
Glucose-Capillary: 107 mg/dL — ABNORMAL HIGH (ref 65–99)

## 2015-04-13 LAB — COMPREHENSIVE METABOLIC PANEL
ALK PHOS: 46 U/L (ref 38–126)
ALT: 30 U/L (ref 17–63)
AST: 34 U/L (ref 15–41)
Albumin: 2.4 g/dL — ABNORMAL LOW (ref 3.5–5.0)
Anion gap: 14 (ref 5–15)
BUN: 31 mg/dL — AB (ref 6–20)
CO2: 28 mmol/L (ref 22–32)
CREATININE: 8.54 mg/dL — AB (ref 0.61–1.24)
Calcium: 9.3 mg/dL (ref 8.9–10.3)
Chloride: 96 mmol/L — ABNORMAL LOW (ref 101–111)
GFR, EST AFRICAN AMERICAN: 6 mL/min — AB (ref >60)
GFR, EST NON AFRICAN AMERICAN: 5 mL/min — AB (ref >60)
Glucose, Bld: 97 mg/dL (ref 65–99)
Potassium: 3.5 mmol/L (ref 3.5–5.1)
Sodium: 138 mmol/L (ref 135–145)
Total Bilirubin: 0.6 mg/dL (ref 0.3–1.2)
Total Protein: 6.9 g/dL (ref 6.5–8.1)

## 2015-04-13 LAB — TROPONIN I: Troponin I: 0.1 ng/mL — ABNORMAL HIGH (ref <0.031)

## 2015-04-13 LAB — PROTIME-INR
INR: 3.79 — ABNORMAL HIGH (ref 0.00–1.49)
Prothrombin Time: 36.5 seconds — ABNORMAL HIGH (ref 11.6–15.2)

## 2015-04-13 MED ORDER — DEXTROSE 50 % IV SOLN
INTRAVENOUS | Status: AC
  Administered 2015-04-13: 50 mL
  Filled 2015-04-13: qty 50

## 2015-04-13 NOTE — Progress Notes (Signed)
ANTICOAGULATION CONSULT NOTE - Follow Up Consult  Pharmacy Consult for Warfarin Indication: atrial fibrillation  Allergies  Allergen Reactions  . Diltiazem Other (See Comments)    SEVERE STOMACH CRAMPING PER PT    Patient Measurements: Height: 6\' 4"  (193 cm) Weight: 228 lb 13.4 oz (103.8 kg) IBW/kg (Calculated) : 86.8  Vital Signs: Temp: 98.5 F (36.9 C) (03/14 0957) Temp Source: Oral (03/14 0957) BP: 159/100 mmHg (03/14 0957) Pulse Rate: 73 (03/14 0957)  Labs:  Recent Labs  04/12/15 1001 04/12/15 1901 04/13/15 0529  HGB 9.6*  --  8.2*  HCT 29.9*  --  26.2*  PLT 270  --  261  LABPROT 40.0*  --  36.5*  INR 4.29*  --  3.79*  CREATININE 13.16*  --  8.54*  TROPONINI  --  0.11* 0.10*    Estimated Creatinine Clearance: 8.6 mL/min (by C-G formula based on Cr of 8.54).   Assessment: [age] YOM with ESRD on warfarin PTA for hx Afib. The patient was admitted with a SUPRAtherapeutic INR on the PTA dose. The dose was held on 3/13.  INR today remains SUPRAtherapeutic (INR 3.79 << 4.29, goal of 2-3). Hgb/Hct slight drop, plts wnl - no overt s/sx of bleeding noted.   PTA dose noted to be 7.5mg  daily EXCEPT for 10 mg on Thursdays only.   Goal of Therapy:  INR 2-3   Plan:  1. Hold warfarin dose today 2. Will continue to monitor for any signs/symptoms of bleeding and will follow up with PT/INR in the a.m.   Georgina PillionElizabeth Jerek Meulemans, PharmD, BCPS Clinical Pharmacist Pager: [phone] 04/13/2015 1:24 PM

## 2015-04-13 NOTE — Evaluation (Signed)
Physical Therapy Evaluation Patient Details Name: [name] ErmRichard G Kitko MRN: [id number] DOB: [birth year] Today's Date: 04/13/2015   History of Present Illness  [name] [name] is a 82 y.o. male with past medical history that includes diabetes, A. Fib on coumadin, hypertension, end-stage renal disease on Monday Wednesday Friday dialysis patient presents to the emergency department with chief complaint of generalized weakness and inability to ambulate. Initial evaluation in the emergency department reveals pneumonia.  Clinical Impression  Pt extremely debilitated and is unable to amb at this time without maximal assist. Pt was functioning at supervision level up until 2 weeks ago. At this time recommending ST-SNF upon d/c for rehab to achieve PLOF prior to transition home.    Follow Up Recommendations SNF;Supervision/Assistance - 24 hour    Equipment Recommendations  None recommended by PT    Recommendations for Other Services       Precautions / Restrictions Precautions Precautions: Fall Precaution Comments: on 3 Lo2 via [place] Restrictions Weight Bearing Restrictions: No      Mobility  Bed Mobility Overal bed mobility: Needs Assistance Bed Mobility: Supine to Sit     Supine to sit: Min assist;Mod assist     General bed mobility comments: assist for trunk elevation, use of bed rail and HOB elevated  Transfers Overall transfer level: Needs assistance Equipment used: Rolling walker (2 wheeled) Transfers: Sit to/from Stand Sit to Stand: Mod assist;+2 physical assistance         General transfer comment: attempted x2, unable to complete full upright standing, bilat knee and trunk flexion  Ambulation/Gait Ambulation/Gait assistance: Mod assist;+2 safety/equipment Ambulation Distance (Feet): 3 Feet Assistive device: Rolling walker (2 wheeled) Gait Pattern/deviations: Step-to pattern;Decreased stride length;Trunk flexed;Narrow base of support Gait velocity: extremely slow Gait  velocity interpretation: Below normal speed for age/gender General Gait Details: pt uanble to maintain full upright position to amb. progressive knee and trunk flexion. pt reports "my legs just giving out" SpO2 at 95% on RA  Stairs            Wheelchair Mobility    Modified Rankin (Stroke Patients Only)       Balance Overall balance assessment: Needs assistance Sitting-balance support: Feet supported;Bilateral upper extremity supported Sitting balance-Leahy Scale: Poor Sitting balance - Comments: requires UE assist to maintain midline   Standing balance support: Bilateral upper extremity supported Standing balance-Leahy Scale: Poor Standing balance comment: unable to maintain sitting without UE support                             Pertinent Vitals/Pain Pain Assessment: No/denies pain    Home Living Family/patient expects to be discharged to:: Unsure Living Arrangements: Spouse/significant other Available Help at Discharge: Family;Available 24 hours/day Type of Home: House Home Access: Stairs to enter   Entergy CorporationEntrance Stairs-Number of Steps: 2 Home Layout: One level Home Equipment: Cane - single point;Walker - 2 wheels Additional Comments: Zenaida Niecevan comes to get pt for dialysis, pt reports up until 2 weeks ago pt was able to walk short distances with RW or cane and could do ADLs indep    Prior Function Level of Independence: Needs assistance   Gait / Transfers Assistance Needed: uses RW or cane  ADL's / Homemaking Assistance Needed: was indep until 2 weeks, wife now helps with dressing and bathing        Hand Dominance   Dominant Hand: Right    Extremity/Trunk Assessment   Upper Extremity Assessment: Generalized weakness  Lower Extremity Assessment: Generalized weakness      Cervical / Trunk Assessment: Normal (increased trunk flexion due to weakness)  Communication   Communication:  (garbled speech)  Cognition Arousal/Alertness:  Awake/alert Behavior During Therapy: WFL for tasks assessed/performed Overall Cognitive Status: Within Functional Limits for tasks assessed                      General Comments      Exercises        Assessment/Plan    PT Assessment Patient needs continued PT services  PT Diagnosis Difficulty walking;Generalized weakness   PT Problem List Decreased strength;Decreased range of motion;Decreased activity tolerance;Decreased balance;Decreased mobility;Decreased safety awareness  PT Treatment Interventions DME instruction;Gait training;Functional mobility training;Therapeutic activities;Therapeutic exercise;Balance training   PT Goals (Current goals can be found in the Care Plan section) Acute Rehab PT Goals Patient Stated Goal: to get better PT Goal Formulation: With patient/family Time For Goal Achievement: 04/20/15 Potential to Achieve Goals: Fair    Frequency Min 3X/week   Barriers to discharge Decreased caregiver support pt avail 24/7 but can't provided more than minA    Co-evaluation               End of Session Equipment Utilized During Treatment: Gait belt Activity Tolerance: Patient limited by fatigue Patient left: in chair;with call bell/phone within reach;with chair alarm set;with family/visitor present Nurse Communication: Mobility status    Functional Assessment Tool Used: clinical judgement Functional Limitation: Mobility: Walking and moving around Mobility: Walking and Moving Around Current Status [phone]): At least 40 percent but less than 60 percent impaired, limited or restricted Mobility: Walking and Moving Around Goal Status [phone]): At least 1 percent but less than 20 percent impaired, limited or restricted    Time: 1112-1132 PT Time Calculation (min) (ACUTE ONLY): 20 min   Charges:   PT Evaluation $PT Eval Moderate Complexity: 1 Procedure     PT G Codes:   PT G-Codes **NOT FOR INPATIENT CLASS** Functional Assessment Tool Used:  clinical judgement Functional Limitation: Mobility: Walking and moving around Mobility: Walking and Moving Around Current Status (U9811): At least 40 percent but less than 60 percent impaired, limited or restricted Mobility: Walking and Moving Around Goal Status [phone]): At least 1 percent but less than 20 percent impaired, limited or restricted    Marcene Brawn 04/13/2015, 1:53 PM   Lewis Shock, PT, DPT Pager #: [phone] Office #: [phone]

## 2015-04-13 NOTE — Progress Notes (Signed)
PROGRESS NOTE  [name] [name] AVW:[id number] DOB: 02-Jul-1933 DOA: 04/12/2015 PCP: Katy Apo, MD  Assessment/Plan: Acute respiratory failure with hypoxia likely secondary to healthcare associated pneumonia per chest x-ray. Oxygen saturation level 87% on room air at rest. No chest pain. - vancomycin and cefepime -influenza panel negative - sputum Culture as able -Oxygen supplementation- wean as able -Nebulizers every 4 hours  Healthcare associated pneumonia. Per chest x-ray. See #1. Lactic acid within the limits of normal. Temperature 99.2. -Antibiotics as noted above -Blood cultures pending -speech therapy to evaluate for silent aspiration -physical therapy -Followup PA and lateral chest radiographs recommended in 3-4 weeks following trial of antibiotic therapy to ensure resolution and exclude underlying malignancy.  Hypoglycemia -holding home meds- glucotrol   Elevated troponin. Likely related to #1 the setting of end-stage renal disease. Denies chest pain. Troponin 0.22. KG without acute changes -flat -no chest pain   End-stage renal disease. He is a Monday Wednesday Friday dialysis patient. Creatinine 13.6 on admission -Nephrology consulted-3L removed on 3/13  Diabetes. On oral agents at home. Serum glucose 135 on admission. -Obtain hemoglobin A1c -Sliding scale insulin for optimal control   History of A. Fib. Chadvasc score 5. EKG shows normal sinus rhythm. Home medications include Coumadin and amiodarone. creatinine 4.26 on admission -Monitor on telemetry  Hypertension. Controlled. -Continue home meds  Anemia. Likely related to chronic disease. Hemoglobin 9.6 on admission. Chart review indicates this is close to his baseline. No signs of active bleeding    Code Status: partial Family Communication: wife at bedside Disposition Plan: home 1-2 days- not on O2 at home   Consultants:  renal  Procedures:  dialysis    HPI/Subjective: Asking to go  home Wife reports coughing when eating at home  Objective: Filed Vitals:   04/13/15 0750 04/13/15 0957  BP: 145/85 159/100  Pulse: 74 73  Temp: 99 F (37.2 C) 98.5 F (36.9 C)  Resp: 22 24    Intake/Output Summary (Last 24 hours) at 04/13/15 1227 Last data filed at 04/13/15 0915  Gross per 24 hour  Intake    572 ml  Output   3000 ml  Net  -2428 ml   Filed Weights   04/12/15 1410 04/12/15 1812 04/12/15 1908  Weight: 103.5 kg (228 lb 2.8 oz) 100.4 kg (221 lb 5.5 oz) 103.8 kg (228 lb 13.4 oz)    Exam:   General:  Awake, NAD- on 3L O2  Cardiovascular: rrr  Respiratory: rales, no wheezing  Abdomen: +BS, soft  Musculoskeletal: + edema   Data Reviewed: Basic Metabolic Panel:  Recent Labs Lab 04/07/15 1605 04/08/15 0404 04/12/15 1001 04/13/15 0529  NA 138 140 141 138  K 3.0* 3.6 4.4 3.5  CL 95* 95* 96* 96*  CO2 GLUCOSE 204* 144* 135* 97  BUN 15 22* 58* 31*  CREATININE 5.68* 7.74* 13.16* 8.54*  CALCIUM 8.6* 8.4* 10.1 9.3   Liver Function Tests:  Recent Labs Lab 04/07/15 1605 04/13/15 0529  AST 30 34  ALT 32 30  ALKPHOS 59 46  BILITOT 0.8 0.6  PROT 8.3* 6.9  ALBUMIN 3.0* 2.4*   No results for input(s): LIPASE, AMYLASE in the last 168 hours. No results for input(s): AMMONIA in the last 168 hours. CBC:  Recent Labs Lab 04/07/15 1605 04/08/15 0404 04/12/15 1001 04/13/15 0529  WBC 7.7 7.5 12.1* 8.6  NEUTROABS 5.2  --  10.4*  --   HGB 10.1* 8.9* 9.6* 8.2*  HCT 30.3* 26.5* 29.9*  26.2*  MCV 92.9 93.6 93.7 94.2  PLT 207 169 270 261   Cardiac Enzymes:  Recent Labs Lab 04/12/15 1901 04/13/15 0529  TROPONINI 0.11* 0.10*   BNP (last 3 results) No results for input(s): BNP in the last 8760 hours.  ProBNP (last 3 results) No results for input(s): PROBNP in the last 8760 hours.  CBG:  Recent Labs Lab 04/12/15 2258 04/13/15 0436 04/13/15 0513 04/13/15 0747 04/13/15 1152  GLUCAP 179* 42* 72 78 98    Recent Results  (from the past 240 hour(s))  MRSA PCR Screening     Status: None   Collection Time: 04/07/15 10:29 PM  Result Value Ref Range Status   MRSA by PCR NEGATIVE NEGATIVE Final    Comment:        The GeneXpert MRSA Assay (FDA approved for NASAL specimens only), is one component of a comprehensive MRSA colonization surveillance program. It is not intended to diagnose MRSA infection nor to guide or monitor treatment for MRSA infections.   Culture, blood (routine x 2) Call MD if unable to obtain prior to antibiotics being given     Status: None (Preliminary result)   Collection Time: 04/12/15 12:12 PM  Result Value Ref Range Status   Specimen Description BLOOD RIGHT HAND  Final   Special Requests IN PEDIATRIC BOTTLE 1CC  Final   Culture NO GROWTH < 24 HOURS  Final   Report Status PENDING  Incomplete  Culture, blood (routine x 2) Call MD if unable to obtain prior to antibiotics being given     Status: None (Preliminary result)   Collection Time: 04/12/15  1:14 PM  Result Value Ref Range Status   Specimen Description BLOOD RIGHT HAND  Final   Special Requests IN PEDIATRIC BOTTLE 2CC  Final   Culture NO GROWTH < 12 HOURS  Final   Report Status PENDING  Incomplete  Gram stain     Status: None   Collection Time: 04/12/15  1:55 PM  Result Value Ref Range Status   Specimen Description TRACHEAL SITE  Final   Special Requests NONE  Final   Gram Stain   Final    FEW WBC PRESENT,BOTH PMN AND MONONUCLEAR MODERATE GRAM POSITIVE RODS FEW GRAM POSITIVE COCCI IN PAIRS FEW GRAM NEGATIVE RODS SQUAMOUS EPITHELIAL CELLS PRESENT    Report Status 04/12/2015 FINAL  Final     Studies: Dg Chest 2 View  04/12/2015  CLINICAL DATA:  Shortness of breath and tachycardia EXAM: CHEST  2 VIEW COMPARISON:  July 10, 2014 FINDINGS: There is patchy airspace consolidation throughout much of the right lung, primarily in the right upper lobe anteriorly. Left lung is clear. Heart is upper normal in size with pulmonary  vascularity within normal limits. No adenopathy. There are no apparent bone lesions. IMPRESSION: Extensive infiltrate on the right, most notably in the anterior segment right upper lobe. Left lung clear. No adenopathy evident. Followup PA and lateral chest radiographs recommended in 3-4 weeks following trial of antibiotic therapy to ensure resolution and exclude underlying malignancy. Electronically Signed   By: Bretta Bang III M.D.   On: 04/12/2015 09:55    Scheduled Meds: . albuterol  2.5 mg Nebulization QID  . amiodarone  300 mg Oral QHS  . amLODipine  10 mg Oral Daily  . aspirin EC  81 mg Oral Daily  . atropine  1 drop Left Eye q12n4p  . calcium acetate  1,334 mg Oral TID WC   And  . calcium acetate  667 mg Oral With snacks  . ceFEPime (MAXIPIME) IV  2 g Intravenous Q M,W,F-2000  . cinacalcet  60 mg Oral QHS  . docusate sodium  100 mg Oral BID  . dorzolamide-timolol  1 drop Right Eye BID  . [START ON 04/14/2015] doxercalciferol  5 mcg Intravenous Q M,W,F-HD  . insulin aspart  0-5 Units Subcutaneous QHS  . insulin aspart  0-9 Units Subcutaneous TID WC  . irbesartan  150 mg Oral Daily  . latanoprost  1 drop Left Eye QHS  . multivitamin with minerals  1 tablet Oral Daily  . prednisoLONE acetate  1 drop Left Eye BID  . simvastatin  20 mg Oral QPM  . sodium chloride flush  3 mL Intravenous Q12H  . tobramycin-dexamethasone  1 drop Left Eye QID  . [START ON 04/14/2015] vancomycin  1,000 mg Intravenous Q M,W,F-HD  . Warfarin - Pharmacist Dosing Inpatient   Does not apply q1800   Continuous Infusions:  Antibiotics Given (last 72 hours)    Date/Time Action Medication Dose Rate   04/12/15 2155 Given   ceFEPIme (MAXIPIME) 2 g in dextrose 5 % 50 mL IVPB 2 g 100 mL/hr      Principal Problem:   Acute respiratory failure (HCC) Active Problems:   Hypertension   Diabetes mellitus with ESRD (end-stage renal disease) (HCC)   ESRD (end stage renal disease) (HCC)   Sinus  bradycardia-tachycardia syndrome (HCC)   HCAP (healthcare-associated pneumonia)   Elevated troponin   Anemia    Time spent: 25 min    Brodan Grewell U [facility] Center[facility]ANN  [facility] Pager [phone]. If 7PM-7AM, please contact night-coverage at [email or web address], password [email or web address] 04/13/2015, 12:27 PM

## 2015-04-13 NOTE — Progress Notes (Signed)
Hypoglycemic Event  CBG: 42  Treatment: 15 GM carbohydrate snack  Symptoms: None  Follow-up CBG: Time:0513 CBG Result:72  Possible Reasons for Event: Inadequate meal intake  Comments/MD notified:Kirby, NP.    [name] [name] I

## 2015-04-13 NOTE — Care Management Obs Status (Signed)
MEDICARE OBSERVATION STATUS NOTIFICATION   Patient Details  Name: [name] [name] MRN: [id number] Date of Birth: 1933/09/12   Medicare Observation Status Notification Given:  Yes    Anacristina Steffek, Annamarie MajorCheryl U, RN 04/13/2015, 12:24 PM

## 2015-04-13 NOTE — Evaluation (Signed)
Clinical/Bedside Swallow Evaluation Patient Details  Name: [name] [name] MRN: [id number] Date of Birth: [birth year]  Today's Date: 04/13/2015 Time: SLP Start Time (ACUTE ONLY): 1410 SLP Stop Time (ACUTE ONLY): 1425 SLP Time Calculation (min) (ACUTE ONLY): 15 min  Past Medical History:  Past Medical History  Diagnosis Date  . Diabetes mellitus without complication (HCC)   . Atrial fibrillation (HCC)   . Hypertension   . Glaucoma   . Peripheral neuropathy (HCC)   . Gout   . Anemia   . ESRD (end stage renal disease) on dialysis [facility])     "MWF; [address]" (04/08/2015)   Past Surgical History:  Past Surgical History  Procedure Laterality Date  . Ablation of dysrhythmic focus    . Sp av dialysis shunt access existing *l*     HPI:  [name] [name] is a 82 y.o. male with past medical history that includes diabetes, A. Fib on coumadin, hypertension, end-stage renal disease on Monday Wednesday Friday dialysis patient presents to the emergency department with chief complaint of generalized weakness and inability to ambulate. Initial evaluation in the emergency department reveals acute respiratory failure with hypoxia likely secondary to healthcare associated pneumonia per chest x-ray.  Per MD notes, wife reports coughing when eating at home.    Assessment / Plan / Recommendation Clinical Impression    Pt presents with functional oropharyngeal swallow marked by adequate reciprocity of swallowing and breathing.  RR is well under threshold for concerns re: swallowing/breathing coordination.  Pt is exhaling post-swallow; there are no overt s/s of aspiration.  Mastication is effective despite absence of teeth, and pt passed 3-oz water test with no deficits. Wife states that coughing with meals occurs infrequently, perhaps once a week at most.  Rec continue current diet - no SLP f/u needed.     Aspiration Risk  No limitations    Diet Recommendation     Medication Administration: Whole  meds with liquid    Other  Recommendations Oral Care Recommendations: Oral care BID   Follow up Recommendations  None    Frequency and Duration            Prognosis        Swallow Study   General Date of Onset: 04/12/15 HPI: [name] [name] is a 82 y.o. male with past medical history that includes diabetes, A. Fib on coumadin, hypertension, end-stage renal disease on Monday Wednesday Friday dialysis patient presents to the emergency department with chief complaint of generalized weakness and inability to ambulate. Initial evaluation in the emergency department reveals acute respiratory failure with hypoxia likely secondary to healthcare associated pneumonia per chest x-ray.  Per MD notes, wife reports coughing when eating at home.  Type of Study: Bedside Swallow Evaluation Previous Swallow Assessment: no Diet Prior to this Study: Dysphagia 3 (soft);Thin liquids Temperature Spikes Noted: No Respiratory Status: Nasal cannula History of Recent Intubation: No Behavior/Cognition: Alert;Cooperative Oral Cavity Assessment: Within Functional Limits Oral Care Completed by SLP: No Oral Cavity - Dentition: Edentulous Vision: Functional for self-feeding Self-Feeding Abilities: Able to feed self Patient Positioning: Upright in bed Baseline Vocal Quality: Normal Volitional Cough: Strong Volitional Swallow: Able to elicit    Oral/Motor/Sensory Function Overall Oral Motor/Sensory Function: Within functional limits   Ice Chips Ice chips: Within functional limits   Thin Liquid Thin Liquid: Within functional limits    Nectar Thick Nectar Thick Liquid: Not tested   Honey Thick Honey Thick Liquid: Not tested   Puree Puree: Within functional  limits   Solid   GO   Solid: Within functional limits    Functional Assessment Tool Used: clinical judgment Functional Limitations: Swallowing Swallow Current Status (Z6109(G8996): 0 percent impaired, limited or restricted Swallow Goal Status (U0454(G8997): 0  percent impaired, limited or restricted Swallow Discharge Status [phone](G8998): 0 percent impaired, limited or restricted   Blenda MountsCouture, Eual Lindstrom Laurice 04/13/2015,2:31 PM

## 2015-04-13 NOTE — Progress Notes (Signed)
Assessment:  1 ESRD 2 Pneumonia  Plan: 1 CXR 2 Antibiotics per IM 3 HD in AM  Subjective: Interval History:  3 liters off with HD yesterday  Objective: Vital signs in last 24 hours: Temp:  [97.7 F (36.5 [name])-99.1 F (37.3 [name])] 98.5 F (36.9 [name]) (03/14 0957) Pulse Rate:  [50-96] 73 (03/14 0957) Resp:  [16-24] 24 (03/14 0957) BP: (136-185)/(67-108) 159/100 mmHg (03/14 0957) SpO2:  [92 %-100 %] 100 % (03/14 0957) FiO2 (%):  [28 %] 28 % (03/14 0937) Weight:  [100.4 kg (221 lb 5.5 oz)-103.8 kg (228 lb 13.4 oz)] 103.8 kg (228 lb 13.4 oz) (03/13 1908) Weight change:   Intake/Output from previous day: 03/13 0701 - 03/14 0700 In: 332 [P.O.:282; IV Piggyback:50] Out: 3000  Intake/Output this shift: Total I/O In: 240 [P.O.:240] Out: 0   General appearance: alert and cooperative Resp: diminished to auscultation bilaterally Chest wall: no tenderness Cardio: regular rate and rhythm, S1, S2 normal, no murmur, click, rub or gallop Extremities: 1+ edema Gen weakness  Lab Results:  Recent Labs  04/12/15 1001 04/13/15 0529  WBC 12.1* 8.6  HGB 9.6* 8.2*  HCT 29.9* 26.2*  PLT 270 261   BMET:  Recent Labs  04/12/15 1001 04/13/15 0529  NA 141 138  K 4.4 3.5  CL 96* 96*  CO2 26 28  GLUCOSE 135* 97  BUN 58* 31*  CREATININE 13.16* 8.54*  CALCIUM 10.1 9.3   No results for input(s): PTH in the last 72 hours. Iron Studies: No results for input(s): IRON, TIBC, TRANSFERRIN, FERRITIN in the last 72 hours. Studies/Results: Dg Chest 2 View  04/12/2015  CLINICAL DATA:  Shortness of breath and tachycardia EXAM: CHEST  2 VIEW COMPARISON:  July 10, 2014 FINDINGS: There is patchy airspace consolidation throughout much of the right lung, primarily in the right upper lobe anteriorly. Left lung is clear. Heart is upper normal in size with pulmonary vascularity within normal limits. No adenopathy. There are no apparent bone lesions. IMPRESSION: Extensive infiltrate on the right, most  notably in the anterior segment right upper lobe. Left lung clear. No adenopathy evident. Followup PA and lateral chest radiographs recommended in 3-4 weeks following trial of antibiotic therapy to ensure resolution and exclude underlying malignancy. Electronically Signed   By: Bretta Bang III M.D.   On: 04/12/2015 09:55    Scheduled: . albuterol  2.5 mg Nebulization QID  . amiodarone  300 mg Oral QHS  . amLODipine  10 mg Oral Daily  . aspirin EC  81 mg Oral Daily  . atropine  1 drop Left Eye q12n4p  . calcium acetate  1,334 mg Oral TID WC   And  . calcium acetate  667 mg Oral With snacks  . ceFEPime (MAXIPIME) IV  2 g Intravenous Q M,W,F-2000  . cinacalcet  60 mg Oral QHS  . docusate sodium  100 mg Oral BID  . dorzolamide-timolol  1 drop Right Eye BID  . [START ON 04/14/2015] doxercalciferol  5 mcg Intravenous Q M,W,F-HD  . insulin aspart  0-5 Units Subcutaneous QHS  . insulin aspart  0-9 Units Subcutaneous TID WC  . irbesartan  150 mg Oral Daily  . latanoprost  1 drop Left Eye QHS  . multivitamin with minerals  1 tablet Oral Daily  . prednisoLONE acetate  1 drop Left Eye BID  . simvastatin  20 mg Oral QPM  . sodium chloride flush  3 mL Intravenous Q12H  . tobramycin-dexamethasone  1 drop Left Eye  QID  . [START ON 04/14/2015] vancomycin  1,000 mg Intravenous Q M,W,F-HD  . Warfarin - Pharmacist Dosing Inpatient   Does not apply q1800       [name] [name] 04/13/2015,11:38 AM

## 2015-04-13 NOTE — Progress Notes (Signed)
Inpatient Diabetes Program Recommendations  AACE/ADA: New Consensus Statement on Inpatient Glycemic Control (2015)  Target Ranges:  Prepandial:   less than 140 mg/dL      Peak postprandial:   less than 180 mg/dL (1-2 hours)      Critically ill patients:  140 - 180 mg/dL  Results for [name] [name], [name] [name] (MRN [id number]) as of 04/13/2015 07:58  Ref. Range 04/08/2015 07:42 04/08/2015 11:11 04/08/2015 16:08 04/12/2015 10:00 04/12/2015 11:56 04/12/2015 19:03 04/12/2015 21:52 04/12/2015 22:58 04/13/2015 04:36 04/13/2015 05:13 04/13/2015 07:47  Glucose-Capillary Latest Ref Range: 65-99 mg/dL 409141 (H) 811169 (H) 914131 (H) 118 (H) 128 (H) 99 169 (H) 179 (H) 42 (LL) 72 78   Review of Glycemic Control  Diabetes history: DM2 Outpatient Diabetes medications: Glipizide 10 mg BID Current orders for Inpatient glycemic control: Novolog 0-9 units TID with meals, Novolog 0-5 units HS  Inpatient Diabetes Program Recommendations: Insulin correction: Noted fasting glucose 42 mg/dl this morning. Patient has not received any insulin since 04/12/15 at 12:21 pm. No changes recommended at this time. Will continue to follow.  Thanks, [name] PennerMarie Angelli Baruch, RN, MSN, CDE Diabetes Coordinator Inpatient Diabetes Program [phone] (Team Pager from 8am to 5pm) [phone] ([facility] office) [phone] [facility] office) [phone] [facility] office)

## 2015-04-14 ENCOUNTER — Telehealth: Payer: Self-pay | Admitting: Physician Assistant

## 2015-04-14 DIAGNOSIS — N186 End stage renal disease: Secondary | ICD-10-CM

## 2015-04-14 DIAGNOSIS — J9601 Acute respiratory failure with hypoxia: Secondary | ICD-10-CM

## 2015-04-14 DIAGNOSIS — J189 Pneumonia, unspecified organism: Principal | ICD-10-CM

## 2015-04-14 DIAGNOSIS — E1122 Type 2 diabetes mellitus with diabetic chronic kidney disease: Secondary | ICD-10-CM

## 2015-04-14 DIAGNOSIS — I484 Atypical atrial flutter: Secondary | ICD-10-CM

## 2015-04-14 DIAGNOSIS — R7989 Other specified abnormal findings of blood chemistry: Secondary | ICD-10-CM

## 2015-04-14 DIAGNOSIS — I10 Essential (primary) hypertension: Secondary | ICD-10-CM

## 2015-04-14 LAB — CBC
HCT: 24.4 % — ABNORMAL LOW (ref 39.0–52.0)
HCT: 25.5 % — ABNORMAL LOW (ref 39.0–52.0)
Hemoglobin: 8.2 g/dL — ABNORMAL LOW (ref 13.0–17.0)
Hemoglobin: 8.6 g/dL — ABNORMAL LOW (ref 13.0–17.0)
MCH: 31.2 pg (ref 26.0–34.0)
MCH: 31.4 pg (ref 26.0–34.0)
MCHC: 33.6 g/dL (ref 30.0–36.0)
MCHC: 33.7 g/dL (ref 30.0–36.0)
MCV: 92.8 fL (ref 78.0–100.0)
MCV: 93.1 fL (ref 78.0–100.0)
PLATELETS: 237 10*3/uL (ref 150–400)
Platelets: 260 K/uL (ref 150–400)
RBC: 2.63 MIL/uL — AB (ref 4.22–5.81)
RBC: 2.74 MIL/uL — ABNORMAL LOW (ref 4.22–5.81)
RDW: 15.2 % (ref 11.5–15.5)
RDW: 15 % (ref 11.5–15.5)
WBC: 7.4 10*3/uL (ref 4.0–10.5)
WBC: 8.2 K/uL (ref 4.0–10.5)

## 2015-04-14 LAB — RENAL FUNCTION PANEL
Albumin: 2.2 g/dL — ABNORMAL LOW (ref 3.5–5.0)
Anion gap: 14 (ref 5–15)
BUN: 51 mg/dL — ABNORMAL HIGH (ref 6–20)
CO2: 27 mmol/L (ref 22–32)
Calcium: 9.5 mg/dL (ref 8.9–10.3)
Chloride: 94 mmol/L — ABNORMAL LOW (ref 101–111)
Creatinine, Ser: 11.14 mg/dL — ABNORMAL HIGH (ref 0.61–1.24)
GFR calc Af Amer: 4 mL/min — ABNORMAL LOW (ref >60)
GFR calc non Af Amer: 4 mL/min — ABNORMAL LOW (ref >60)
Glucose, Bld: 100 mg/dL — ABNORMAL HIGH (ref 65–99)
Phosphorus: 5.6 mg/dL — ABNORMAL HIGH (ref 2.5–4.6)
Potassium: 4 mmol/L (ref 3.5–5.1)
Sodium: 135 mmol/L (ref 135–145)

## 2015-04-14 LAB — MRSA PCR SCREENING: MRSA BY PCR: NEGATIVE

## 2015-04-14 LAB — BASIC METABOLIC PANEL
Anion gap: 15 (ref 5–15)
BUN: 48 mg/dL — AB (ref 6–20)
CALCIUM: 9.4 mg/dL (ref 8.9–10.3)
CHLORIDE: 94 mmol/L — AB (ref 101–111)
CO2: 27 mmol/L (ref 22–32)
CREATININE: 10.63 mg/dL — AB (ref 0.61–1.24)
GFR, EST AFRICAN AMERICAN: 5 mL/min — AB (ref >60)
GFR, EST NON AFRICAN AMERICAN: 4 mL/min — AB (ref >60)
Glucose, Bld: 118 mg/dL — ABNORMAL HIGH (ref 65–99)
Potassium: 4 mmol/L (ref 3.5–5.1)
SODIUM: 136 mmol/L (ref 135–145)

## 2015-04-14 LAB — GLUCOSE, CAPILLARY
GLUCOSE-CAPILLARY: 95 mg/dL (ref 65–99)
GLUCOSE-CAPILLARY: 201 mg/dL — AB (ref 65–99)
Glucose-Capillary: 40 mg/dL — CL (ref 65–99)
Glucose-Capillary: 80 mg/dL (ref 65–99)
Glucose-Capillary: 102 mg/dL — ABNORMAL HIGH (ref 65–99)

## 2015-04-14 LAB — PROTIME-INR
INR: 3.56 — AB (ref 0.00–1.49)
PROTHROMBIN TIME: 34.8 s — AB (ref 11.6–15.2)

## 2015-04-14 MED ORDER — AMIODARONE HCL IN DEXTROSE 360-4.14 MG/200ML-% IV SOLN
60.0000 mg/h | INTRAVENOUS | Status: DC
  Administered 2015-04-14 (×2): 60 mg/h via INTRAVENOUS
  Filled 2015-04-14 (×3): qty 200

## 2015-04-14 MED ORDER — AMIODARONE LOAD VIA INFUSION
150.0000 mg | Freq: Once | INTRAVENOUS | Status: AC
  Administered 2015-04-14: 150 mg via INTRAVENOUS
  Filled 2015-04-14: qty 83.34

## 2015-04-14 MED ORDER — INSULIN ASPART 100 UNIT/ML ~~LOC~~ SOLN: 0.0000 [IU] | Freq: Three times a day (TID) | SUBCUTANEOUS | Status: DC

## 2015-04-14 MED ORDER — AMIODARONE HCL IN DEXTROSE 360-4.14 MG/200ML-% IV SOLN
30.0000 mg/h | INTRAVENOUS | Status: DC
  Administered 2015-04-14 – 2015-04-15 (×3): 30 mg/h via INTRAVENOUS
  Filled 2015-04-14 (×4): qty 200

## 2015-04-14 MED ORDER — SODIUM CHLORIDE 0.9 % IV SOLN: 100.0000 mL | INTRAVENOUS | Status: DC | PRN

## 2015-04-14 MED ORDER — DEXTROSE 50 % IV SOLN
INTRAVENOUS | Status: AC
  Filled 2015-04-14: qty 50

## 2015-04-14 MED ORDER — SODIUM CHLORIDE 0.9 % IV SOLN
62.5000 mg | Freq: Once | INTRAVENOUS | Status: AC
  Administered 2015-04-14: 62.5 mg via INTRAVENOUS
  Filled 2015-04-14 (×2): qty 5

## 2015-04-14 MED ORDER — METOPROLOL TARTRATE 1 MG/ML IV SOLN: 5.0000 mg | Freq: Four times a day (QID) | INTRAVENOUS | Status: DC | PRN

## 2015-04-14 MED ORDER — LIDOCAINE HCL (PF) 1 % IJ SOLN: 5.0000 mL | INTRAMUSCULAR | Status: DC | PRN

## 2015-04-14 MED ORDER — DEXTROSE 50 % IV SOLN
1.0000 | INTRAVENOUS | Status: AC
  Administered 2015-04-14: 50 mL via INTRAVENOUS

## 2015-04-14 MED ORDER — DEXTROSE 10 % IV SOLN
INTRAVENOUS | Status: DC
  Administered 2015-04-14: 04:00:00 via INTRAVENOUS

## 2015-04-14 MED ORDER — DOXERCALCIFEROL 4 MCG/2ML IV SOLN
INTRAVENOUS | Status: AC
  Filled 2015-04-14: qty 4

## 2015-04-14 MED ORDER — VANCOMYCIN HCL IN DEXTROSE 1-5 GM/200ML-% IV SOLN
INTRAVENOUS | Status: AC
  Filled 2015-04-14: qty 200

## 2015-04-14 MED ORDER — LIDOCAINE-PRILOCAINE 2.5-2.5 % EX CREA
1.0000 "application " | TOPICAL_CREAM | CUTANEOUS | Status: DC | PRN
  Filled 2015-04-14: qty 5

## 2015-04-14 MED ORDER — PENTAFLUOROPROP-TETRAFLUOROETH EX AERO: 1.0000 "application " | INHALATION_SPRAY | CUTANEOUS | Status: DC | PRN

## 2015-04-14 MED ORDER — METOPROLOL TARTRATE 25 MG PO TABS
25.0000 mg | ORAL_TABLET | Freq: Two times a day (BID) | ORAL | Status: DC
Start: 2015-04-14 — End: 2015-04-15
  Administered 2015-04-14 – 2015-04-15 (×2): 25 mg via ORAL
  Filled 2015-04-14 (×2): qty 1

## 2015-04-14 NOTE — Consult Note (Signed)
CARDIOLOGY CONSULT NOTE   Patient ID: [name] [name] MRN: [id number] DOB/AGE: August 12, 1933 82 y.o.  Admit date: 04/12/2015  Primary Physician   [name] Apo, [name] Primary Cardiologist : [name] [name]  Reason for Consultation: Afib/RVR  HPI: [name] [name] is a 82 year old male with past medical history of ESRD with HD on M/W/F, DM, Afib on coumadin, and HTN.  He presented to [facility] ED on 04/12/15 with weakness and inability to walk.  Chest X ray shows RUL PNA.   In the ED he was febrile and hypoxic.  He was in NSR at that time. He was admitted and started on vancomycin and cefepime.  He was in HD today and HR was noted to be in 130's.  BP is stable with SBP in 110's. He has a history of Afib and takes po Amio at home. He did wear a 24 hour Holter monitor in Oct. 2016 that showed no Afib/flutter, and was an overall normal study.     After reviewing telemetry, he actually went into Aflutter with rate 120-130 at 22:20 last night (04/13/15).  He has been asymptomatic.  He denies chest pain, he is unaware of his heart rate.  Denies palpitations.   Last echo was in Dec. 2013, LVEF was 55-60%, no WMA's.      Past Medical History  Diagnosis Date  . Diabetes mellitus without complication (HCC)   . Atrial fibrillation (HCC)   . Hypertension   . Glaucoma   . Peripheral neuropathy (HCC)   . Gout   . Anemia   . ESRD (end stage renal disease) on dialysis [facility])     "MWF; [address]" (04/08/2015)     Past Surgical History  Procedure Laterality Date  . Ablation of dysrhythmic focus    . Sp av dialysis shunt access existing *l*      Allergies  Allergen Reactions  . Diltiazem Other (See Comments)    SEVERE STOMACH CRAMPING PER PT    I have reviewed the patient's current medications . albuterol  2.5 mg Nebulization QID  . amiodarone  150 mg Intravenous Once  . amLODipine  10 mg Oral Daily  . aspirin EC  81 mg Oral Daily  . atropine  1 drop Left Eye q12n4p  . calcium acetate  1,334  mg Oral TID WC   And  . calcium acetate  667 mg Oral With snacks  . ceFEPime (MAXIPIME) IV  2 g Intravenous Q M,W,F-2000  . cinacalcet  60 mg Oral QHS  . docusate sodium  100 mg Oral BID  . dorzolamide-timolol  1 drop Right Eye BID  . doxercalciferol      . doxercalciferol  5 mcg Intravenous Q M,W,F-HD  . ferric gluconate (FERRLECIT/NULECIT) IV  62.5 mg Intravenous Once  . insulin aspart  0-8 Units Subcutaneous TID WC  . irbesartan  150 mg Oral Daily  . latanoprost  1 drop Left Eye QHS  . multivitamin with minerals  1 tablet Oral Daily  . prednisoLONE acetate  1 drop Left Eye BID  . simvastatin  20 mg Oral QPM  . sodium chloride flush  3 mL Intravenous Q12H  . tobramycin-dexamethasone  1 drop Left Eye QID  . vancomycin      . vancomycin  1,000 mg Intravenous Q M,W,F-HD  . Warfarin - Pharmacist Dosing Inpatient   Does not apply q1800   . amiodarone 60 mg/hr (04/14/15 1526)   Followed by  . amiodarone  sodium chloride, sodium chloride, acetaminophen **OR** acetaminophen, hydrALAZINE, HYDROcodone-acetaminophen, lidocaine (PF), lidocaine-prilocaine, morphine injection, ondansetron **OR** ondansetron (ZOFRAN) IV, pentafluoroprop-tetrafluoroeth  Prior to Admission medications   Medication Sig Start Date End Date Taking? Authorizing Provider  acetaminophen (TYLENOL) 325 MG tablet Take 650 mg by mouth daily as needed for mild pain.   Yes [name] [name]  albuterol (PROVENTIL HFA;VENTOLIN HFA) 108 (90 Base) MCG/ACT inhaler Inhale 1-2 puffs into the lungs every 6 (six) hours as needed for wheezing or shortness of breath.  03/29/15 03/28/16 Yes [name] [name]  amiodarone (PACERONE) 200 MG tablet Take 1 tablet (200 mg total) by mouth at bedtime. Patient taking differently: Take 300 mg by mouth at bedtime.  04/08/15  Yes [name] [name]  amLODipine (NORVASC) 10 MG tablet Take 10 mg by mouth at bedtime.  01/09/14  Yes [name] [name]  aspirin EC 81 MG tablet  Take 81 mg by mouth daily.   Yes [name] [name]  atropine 1 % ophthalmic solution Place 1 drop into the left eye 2 times daily at 12 noon and 4 pm.   Yes [name] [name]  Calcium Acetate 667 MG TABS Take 1-2 capsules by mouth 3 (three) times daily. 667 mg for snacks, 1334 mg for meals   Yes [name] [name]  docusate sodium (COLACE) 100 MG capsule Take 100 mg by mouth 2 (two) times daily.   Yes [name] [name]  dorzolamide-timolol (COSOPT) 22.3-6.8 MG/ML ophthalmic solution Place 1 drop into the right eye 2 (two) times daily.    Yes [name] [name]  folic acid-vitamin b complex-vitamin c-selenium-zinc (DIALYVITE) 3 MG TABS tablet Take 1 tablet by mouth daily.   Yes [name] [name]  glipiZIDE (GLUCOTROL) 10 MG tablet Take 10 mg by mouth 2 (two) times daily before a meal.   Yes [name] [name]  lidocaine-prilocaine (EMLA) cream Apply 1 application topically as needed (apply to skin near dialysis port before dialysis).  08/31/14  Yes [name] [name]  prednisoLONE acetate (PRED FORTE) 1 % ophthalmic suspension Place 1 drop into the left eye 2 (two) times daily. Patient to use for 37 days. Filled on 03-28-15   Yes [name] [name]  SENSIPAR 60 MG tablet Take 60 mg by mouth at bedtime.  09/03/14  Yes [name] [name]  simvastatin (ZOCOR) 20 MG tablet Take 20 mg by mouth every evening.   Yes [name] [name]  tobramycin-dexamethasone [facility](TOBRADEX) ophthalmic solution Place 1 drop into the left eye 4 (four) times daily. For 37 days. Filled 03-31-15   Yes [name] [name]  TRAVATAN Z 0.004 % SOLN ophthalmic solution Place 1 drop into the right eye at bedtime.  08/30/14  Yes [name] [name]  valsartan (DIOVAN) 160 MG tablet Take 160 mg by mouth at bedtime.    Yes [name] [name]  warfarin (COUMADIN) 5 MG tablet Take as directed by coumadin clinic Patient taking differently: Take 7.5-10 mg by mouth daily at 6 PM.  Takes 10mg  on thurs only Takes 7.5mg  on all other days 03/11/15  Yes [name] [name]     Social History   Social History  . Marital Status: Married    Spouse Name: N/A  . Number of Children: N/A  . Years of Education: N/A   Occupational History  . Not on file.   Social History Main Topics  . Smoking status: Never Smoker   . Smokeless tobacco: Never Used  . Alcohol Use: No  . Drug Use:  No  . Sexual Activity: Not on file   Other Topics Concern  . Not on file   Social History Narrative    Family Status  Relation Status Death Age  . Mother Deceased   . Father Deceased   . Maternal Grandmother Deceased   . Maternal Grandfather Deceased   . Paternal Grandmother Deceased   . Paternal Grandfather Deceased   . Sister Alive   . Sister Alive   . Brother Deceased   . Brother Deceased    Family History  Problem Relation Age of Onset  . Alcoholism Brother   . Alcoholism Brother   . Other Mother     bowel obstruction  . Heart failure Father     fluid bluid up     ROS:  Full 14 point review of systems complete and found to be negative unless listed above.  Physical Exam: Blood pressure 119/83, pulse 137, temperature 98.6 F (37 C), temperature source Oral, resp. rate 23, height 6\' 4"  (1.93 m), weight 230 lb 13.2 oz (104.7 kg), SpO2 98 %.  General: Well developed, well nourished, male in no acute distress Head: Eyes PERRLA, No xanthomas.   Normocephalic and atraumatic, oropharynx without edema or exudate. Dentition: Poor Lungs: Rhonchi in bilateral upper lobes, diminished in the bases.  Heart: HRRR S1 S2, no rub/gallop, No murmur. Pulses are 2+ extrem.   Neck: No carotid bruits. No lymphadenopathy. No JVD. Abdomen: Bowel sounds present, abdomen soft and non-tender without masses or hernias noted. Msk:  No spine or cva tenderness. No weakness, no joint deformities or effusions. Extremities: No clubbing or cyanosis. No edema.  Neuro: Alert and oriented X 3. No focal  deficits noted. Psych:  Good affect, responds appropriately Skin: No rashes or lesions noted.  Labs:   Lab Results  Component Value Date   WBC 8.2 04/14/2015   HGB 8.6* 04/14/2015   HCT 25.5* 04/14/2015   MCV 93.1 04/14/2015   PLT 260 04/14/2015    Recent Labs  04/14/15 0527  INR 3.56*    Recent Labs Lab 04/13/15 0529  04/14/15 1028  NA 138  < > 135  K 3.5  < > 4.0  CL 96*  < > 94*  CO2 28  < > 27  BUN 31*  < > 51*  CREATININE 8.54*  < > 11.14*  CALCIUM 9.3  < > 9.5  PROT 6.9  --   --   BILITOT 0.6  --   --   ALKPHOS 46  --   --   ALT 30  --   --   AST 34  --   --   GLUCOSE 97  < > 100*  ALBUMIN 2.4*  --  2.2*  < > = values in this interval not displayed. MAGNESIUM  Date Value Ref Range Status  01/10/2012 2.9* 1.5 - 2.5 mg/dL Final    Recent Labs  16/10/96 1901 04/13/15 0529  TROPONINI 0.11* 0.10*    Recent Labs  04/12/15 1008  TROPIPOC 0.22*   TSH  Date/Time Value Ref Range Status  04/07/2015 10:28 PM 3.298 0.350 - 4.500 uIU/mL Final  10/29/2014 10:49 AM 2.79 0.35 - 4.50 uIU/mL Final    Echo: Dec. 2013 - Left ventricle: The cavity size was normal. There was mild concentric hypertrophy. Systolic function was normal. The estimated ejection fraction was in the range of 55% to 60%. Wall motion was normal; there were no regional wall motion abnormalities. - Mitral valve: Mild regurgitation. - Right  ventricle: The cavity size was mildly dilated. Wall thickness was normal. - Right atrium: The atrium was mildly dilated  ECG:    Radiology:  Dg Chest 1 View  04/13/2015  CLINICAL DATA:  I50.9 (ICD-10-CM) - CHF (congestive heart failure) (HCC) J18.9 (ICD-10-CM) - PNA (pneumonia) cough EXAM: CHEST  1 VIEW COMPARISON:  the previous day's study FINDINGS: Asymmetric interstitial and alveolar opacities most prominent in the right upper lobe as before. Heart size upper limits normal for technique. No definite effusion. No pneumothorax. Visualized  skeletal structures are unremarkable. IMPRESSION: 1. Little change in asymmetric infiltrates or edema. Electronically Signed   By: Corlis Leak M.D.   On: 04/13/2015 13:05    ASSESSMENT AND PLAN:    Principal Problem:   Acute respiratory failure (HCC) Active Problems:   Hypertension   Diabetes mellitus with ESRD (end-stage renal disease) (HCC)   ESRD (end stage renal disease) (HCC)   Sinus bradycardia-tachycardia syndrome (HCC)   HCAP (healthcare-associated pneumonia)   Elevated troponin   Anemia   1. Rapid Aflutter - He is asymptomatic, BP stable.  - Most likely due to underlying respiratory illness.  - INR is therapeutic, he is on coumadin.  - Can continue Amio drip. - Add prn IV Metoprolol  for HR > 130.  - Consider adding BB daily.   - Transfer to step down floor.    Signed: Little Ishikawa, NP 04/14/2015 3:28 PM   Co-Sign [name]  I have seen and examined the patient along with Little Ishikawa, NP.  I have reviewed the chart, notes and new data.  I agree with NP's note.  Key new complaints: he feels "fine" Key examination changes: rapid regular rhythm, a few wheezes, no overt hypervolemia Key new findings / data: telemetry shows very abrupt arrhythmia onset at 2200h  PLAN: About 1 hour after starting IV amio, still quite tachycardic. Add beta blocker. Arrhythmia may be difficult to control until pulmonary process improves. Continue anticoagulation.  Thurmon Fair, [name], [facility] [phone] 04/14/2015, 4:46 PM

## 2015-04-14 NOTE — Progress Notes (Signed)
[facility] Progress Note  Assessment/Plan: 1. Acute respiratory failure with hypoxia/possible HCAP: Per primary, continue Vanc & Maxipime. Tmax 99. On RA.  2. ESRD -MWF Will have HD today. K+4.0 3. Anemia - HGB 8.2 Rec'd ESA 04/09/15. Follow HGB-may need extra dose ESA 4. Secondary hyperparathyroidism - Continue binders 5. HTN/volume - Had HD 04/12/15 Net UF 3000 Unsure that post wt was correct. Wt today 102.2 Will attempt UFG 1-1.5 liters today. Lower EDW upon DC.  6. Nutrition - Dysphasia 3 diet. Ca 9.4 C Ca 10.68 use 2.0 Ca bath 7. DM: having episodes for hypoglycemia, started on D10w over night and rec'd D50w for BS 40s. Discussed with primary. Stop glipizide.  8. H/O Afib: Coumadin on hold INR 3.56  Disposition: Refusing SNF and HH. Am concerned about ability to receive needed care at home as OP. Will as social work for assistance.   Rita H. Brown NP-C 04/14/2015, 9:03 AM  [facility] [phone] Renal Attending: Tachycardic which is problematic potentially for IHD.  Will follow Emiko Osorto C    Subjective: "I feel OK". No specific complaints other than being upset because he doesn't have cornflakes.  Denies SOB. O2 off. Has been working with PT-ambulate     Objective Filed Vitals:   04/13/15 1943 04/13/15 1946 04/13/15 2200 04/14/15 0425  BP: 139/73  124/64 112/75  Pulse: 72  76 115  Temp: 98.2 F (36.8 C)  98.9 F (37.2 C) 97.7 F (36.5 C)  TempSrc: Oral   Oral  Resp: 20  24 20   Height:      Weight: 102.2 kg (225 lb 5 oz)     SpO2: 96% 97% 100% 99%   Physical Exam General: Chronically ill appearing male in NAD Heart: S1, S2, II/VI systolic M Lungs: Bilateral breath sounds coarse breath sounds upper airways, no wheezing Abdomen: soft, nontender, active BS Extremities: no LE edema Dialysis Access: LUA AVF + bruit   Dialysis Orders:  [facility] at MWF MonWedFri, 4 hrs 0 min, 180NRe Optiflux, BFR 450, DFR Autoflow 1.5, EDW 104 (kg),  Dialysate 3.0 K, 2.25 Ca, UFR Profile: None, Sodium Model: Linear, Access: LUA AV Fistula  Additional Objective Labs: Basic Metabolic Panel:  Recent Labs Lab 04/12/15 1001 04/13/15 0529 04/14/15 0523  NA 141 138 136  K 4.4 3.5 4.0  CL 96* 96* 94*  CO2 26 28 27   GLUCOSE 135* 97 118*  BUN 58* 31* 48*  CREATININE 13.16* 8.54* 10.63*  CALCIUM 10.1 9.3 9.4   Liver Function Tests:  Recent Labs Lab 04/07/15 1605 04/13/15 0529  AST 30 34  ALT 32 30  ALKPHOS 59 46  BILITOT 0.8 0.6  PROT 8.3* 6.9  ALBUMIN 3.0* 2.4*   No results for input(s): LIPASE, AMYLASE in the last 168 hours. CBC:  Recent Labs Lab 04/07/15 1605 04/08/15 0404 04/12/15 1001 04/13/15 0529 04/14/15 0523  WBC 7.7 7.5 12.1* 8.6 7.4  NEUTROABS 5.2  --  10.4*  --   --   HGB 10.1* 8.9* 9.6* 8.2* 8.2*  HCT 30.3* 26.5* 29.9* 26.2* 24.4*  MCV 92.9 93.6 93.7 94.2 92.8  PLT 207 169 270 261 237   Blood Culture    Component Value Date/Time   SDES TRACHEAL SITE 04/12/2015 1355   SDES SPUTUM 04/12/2015 1355   SPECREQUEST NONE 04/12/2015 1355   SPECREQUEST NONE 04/12/2015 1355   CULT PENDING 04/12/2015 1355   REPTSTATUS 04/12/2015 FINAL 04/12/2015 1355   REPTSTATUS PENDING 04/12/2015 1355    Cardiac Enzymes:  Recent Labs Lab 04/12/15 1901 04/13/15 0529  TROPONINI 0.11* 0.10*   CBG:  Recent Labs Lab 04/13/15 2209 04/13/15 2300 04/14/15 0243 04/14/15 0309 04/14/15 0747  GLUCAP 35* 107* 40* 80 102*   Iron Studies: No results for input(s): IRON, TIBC, TRANSFERRIN, FERRITIN in the last 72 hours. @ Studies/Results: Dg Chest 1 View  04/13/2015  CLINICAL DATA:  I50.9 (ICD-10-CM) - CHF (congestive heart failure) (HCC) J18.9 (ICD-10-CM) - PNA (pneumonia) cough EXAM: CHEST  1 VIEW COMPARISON:  the previous day's study FINDINGS: Asymmetric interstitial and alveolar opacities most prominent in the right upper lobe as before. Heart size upper limits normal for technique. No definite effusion.  No pneumothorax. Visualized skeletal structures are unremarkable. IMPRESSION: 1. Little change in asymmetric infiltrates or edema. Electronically Signed   By: Corlis Leak M.D.   On: 04/13/2015 13:05   Dg Chest 2 View  04/12/2015  CLINICAL DATA:  Shortness of breath and tachycardia EXAM: CHEST  2 VIEW COMPARISON:  July 10, 2014 FINDINGS: There is patchy airspace consolidation throughout much of the right lung, primarily in the right upper lobe anteriorly. Left lung is clear. Heart is upper normal in size with pulmonary vascularity within normal limits. No adenopathy. There are no apparent bone lesions. IMPRESSION: Extensive infiltrate on the right, most notably in the anterior segment right upper lobe. Left lung clear. No adenopathy evident. Followup PA and lateral chest radiographs recommended in 3-4 weeks following trial of antibiotic therapy to ensure resolution and exclude underlying malignancy. Electronically Signed   By: Bretta Bang III M.D.   On: 04/12/2015 09:55   Medications: . dextrose 75 mL/hr at 04/14/15 0352   . albuterol  2.5 mg Nebulization QID  . amiodarone  300 mg Oral QHS  . amLODipine  10 mg Oral Daily  . aspirin EC  81 mg Oral Daily  . atropine  1 drop Left Eye q12n4p  . calcium acetate  1,334 mg Oral TID WC   And  . calcium acetate  667 mg Oral With snacks  . ceFEPime (MAXIPIME) IV  2 g Intravenous Q M,W,F-2000  . cinacalcet  60 mg Oral QHS  . dextrose      . docusate sodium  100 mg Oral BID  . dorzolamide-timolol  1 drop Right Eye BID  . doxercalciferol  5 mcg Intravenous Q M,W,F-HD  . insulin aspart  0-8 Units Subcutaneous TID WC  . irbesartan  150 mg Oral Daily  . latanoprost  1 drop Left Eye QHS  . multivitamin with minerals  1 tablet Oral Daily  . prednisoLONE acetate  1 drop Left Eye BID  . simvastatin  20 mg Oral QPM  . sodium chloride flush  3 mL Intravenous Q12H  . tobramycin-dexamethasone  1 drop Left Eye QID  . vancomycin  1,000 mg Intravenous Q  M,W,F-HD  . Warfarin - Pharmacist Dosing Inpatient   Does not apply [phone]

## 2015-04-14 NOTE — Discharge Instructions (Signed)

## 2015-04-14 NOTE — Progress Notes (Addendum)
Pt. Hypoglycemic Event  CBG: 35   Treatment: D50 IV 50 mL  Symptoms: Nervous/irritable  Follow-up CBG Time: 2300 CBG Result:107  Possible Reasons for Event: Inadequate meal intake  Comments/MD notified: Donnamarie PoagK. Kirby NP    Victory DakinAnike, Cobe Viney C

## 2015-04-14 NOTE — Progress Notes (Signed)
Hypoglycemic Event  CBG: 40  Treatment: Carb snack 15MG   Symptoms: Shaky  Follow-up CBG: Time:0309 CBG Result:80  Possible Reasons for Event: Inadequate meal intake  Comments/MD notified:K. Craige CottaKirby NP  NP also ordered D50 IV 50mL & D10 IV 9075mL/hr    [name] [name]

## 2015-04-14 NOTE — Progress Notes (Signed)
ANTICOAGULATION CONSULT NOTE - Follow Up Consult  Pharmacy Consult for Warfarin Indication: atrial fibrillation  Allergies  Allergen Reactions  . Diltiazem Other (See Comments)    SEVERE STOMACH CRAMPING PER PT    Patient Measurements: Height: 6\' 4"  (193 cm) Weight: 225 lb 5 oz (102.2 kg) IBW/kg (Calculated) : 86.8  Vital Signs: Temp: 98.6 F (37 C) (03/15 0939) Temp Source: Oral (03/15 0939) BP: 121/84 mmHg (03/15 0939) Pulse Rate: 97 (03/15 0939)  Labs:  Recent Labs  04/12/15 1001 04/12/15 1901 04/13/15 0529 04/14/15 0523 04/14/15 0527  HGB 9.6*  --  8.2* 8.2*  --   HCT 29.9*  --  26.2* 24.4*  --   PLT 270  --  261 237  --   LABPROT 40.0*  --  36.5*  --  34.8*  INR 4.29*  --  3.79*  --  3.56*  CREATININE 13.16*  --  8.54* 10.63*  --   TROPONINI  --  0.11* 0.10*  --   --     Estimated Creatinine Clearance: 6.9 mL/min (by C-G formula based on Cr of 10.63).   Assessment: [age] YOM with ESRD on warfarin PTA for hx Afib. The patient was admitted with a SUPRAtherapeutic INR on the PTA dose. Pharmacy was consulted to manage warfarin dosing this admission.  INR today remains SUPRAtherapeutic despite holding doses on 3/13 and 3/14 (INR 3.56 << 3.79, goal of 2-3). CBC stable - no overt s/sx of bleeding noted.   PTA dose noted to be 7.5mg  daily EXCEPT for 10 mg on Thursdays only. The patient was re-educated on warfarin today.  Goal of Therapy:  INR 2-3   Plan:  1. Hold warfarin dose today 2. Will continue to monitor for any signs/symptoms of bleeding and will follow up with PT/INR in the a.m.   Georgina PillionElizabeth Rolonda Pontarelli, PharmD, BCPS Clinical Pharmacist Pager: [phone] 04/14/2015 10:50 AM

## 2015-04-14 NOTE — Progress Notes (Signed)
Hemodialysis:  At about 1230 Patient HR started to be at 130's trending up to 137-138, BP WNL. NS bolus given per policy and MD made aware of the patient status. New order was given to run patient even.  At about 1500 MD in for rounds and requested, EKG, cardiologist consult and amiodarone drip to start. Patient remains asymptomatic , denies any chest pain, no SOB. Will continue to monitor. Cardiologist in to check on the patient .

## 2015-04-14 NOTE — Progress Notes (Signed)
Pt previously refused Tx, RN alerted RT that pt had coughing spell and felt SOB. RT gave scheduled Tx at this time. RT to monitor as needed

## 2015-04-14 NOTE — Procedures (Signed)
Rapid afid to 140.  Low BP.  Will need cardiology to reevaluate med TX. Nolita Kutter C

## 2015-04-14 NOTE — Progress Notes (Signed)
PROGRESS NOTE  [name] ZOX:[id number] DOB: 1933-09-28 DOA: 04/12/2015 PCP: Katy Apo, MD  [name] [name] is a 82 y.o. male with past medical history that includes diabetes, A. Fib on coumadin, hypertension, end-stage renal disease on Monday Wednesday Friday dialysis patient presents to the emergency department with chief complaint of generalized weakness and inability to ambulate. Initial evaluation in the emergency department reveals pneumonia.  He recently was discharged from the hospital after a brief stay for A. fib with RVR. He states he was in his usual state of health until yesterday he gradually felt generalized weakness. He typically uses a walker for ambulation and this morning he awakened and was unable to bear weight. He states his legs "just wouldn't hold me".  Suspect due to hypoglycemia.  Patient was on glucotrol at home-- WOULD NOT RESTART.  PT saw and recommended SNF but patient and family refused.  They also refused home health.    Assessment/Plan: Acute respiratory failure with hypoxia likely secondary to healthcare associated pneumonia per chest x-ray. Oxygen saturation level 87% on room air at rest. No chest pain. - vancomycin and cefepime -influenza panel negative - sputum Culture as able -Oxygen supplementation- wean as able -Nebulizers every 4 hours -home O2 study  Healthcare associated pneumonia. Per chest x-ray.  -Antibiotics as noted above -Blood cultures pending -speech therapy to evaluate for silent aspiration -physical therapy -Followup PA and lateral chest radiographs recommended in 3-4 weeks following trial of antibiotic therapy to ensure resolution and exclude underlying malignancy.  Hypoglycemia -holding home meds- glucotrol D5 NS -monitor blood sugars   Elevated troponin. Likely related to #1 the setting of end-stage renal disease. Denies chest pain. Troponin 0.22. KG without acute changes -flat -no chest pain   End-stage renal disease.  He is a Monday Wednesday Friday dialysis patient. Creatinine 13.6 on admission -Nephrology consulted-3L removed on 3/13  Diabetes. On oral agents at home. Serum glucose 135 on admission but dropped once admitted -hemoglobin A1c: 6.3   History of A. Fib. Chadvasc score 5. EKG shows normal sinus rhythm. Home medications include Coumadin and amiodarone. creatinine 4.26 on admission -Monitor on telemetry  Hypertension. Controlled. -Continue home meds  Anemia. Likely related to chronic disease.  -monitor for bleeding   Code Status: partial Family Communication: wife at bedside Disposition Plan: home O2 eval- patient refusing SNF and home health   Consultants:  renal  Procedures:  dialysis    HPI/Subjective: Says he does not want SNF- wife at bedside agrees  Objective: Filed Vitals:   04/13/15 2200 04/14/15 0425  BP: 124/64 112/75  Pulse: 76 115  Temp: 98.9 F (37.2 C) 97.7 F (36.5 C)  Resp: 24 20    Intake/Output Summary (Last 24 hours) at 04/14/15 0906 Last data filed at 04/13/15 2204  Gross per 24 hour  Intake    720 ml  Output      0 ml  Net    720 ml   Filed Weights   04/12/15 1812 04/12/15 1908 04/13/15 1943  Weight: 100.4 kg (221 lb 5.5 oz) 103.8 kg (228 lb 13.4 oz) 102.2 kg (225 lb 5 oz)    Exam:   General:  Awake, NAD- on 3L O2  Cardiovascular: rrr  Respiratory: rales, no wheezing  Abdomen: +BS, soft  Musculoskeletal: + edema   Data Reviewed: Basic Metabolic Panel:  Recent Labs Lab 04/07/15 1605 04/08/15 0404 04/12/15 1001 04/13/15 0529 04/14/15 0523  NA 138 140 141 138 136  K 3.0* 3.6 4.4 3.5  4.0  CL 95* 95* 96* 96* 94*  CO2 GLUCOSE 204* 144* 135* 97 118*  BUN 15 22* 58* 31* 48*  CREATININE 5.68* 7.74* 13.16* 8.54* 10.63*  CALCIUM 8.6* 8.4* 10.1 9.3 9.4   Liver Function Tests:  Recent Labs Lab 04/07/15 1605 04/13/15 0529  AST 30 34  ALT 32 30  ALKPHOS 59 46  BILITOT 0.8 0.6  PROT 8.3* 6.9   ALBUMIN 3.0* 2.4*   No results for input(s): LIPASE, AMYLASE in the last 168 hours. No results for input(s): AMMONIA in the last 168 hours. CBC:  Recent Labs Lab 04/07/15 1605 04/08/15 0404 04/12/15 1001 04/13/15 0529 04/14/15 0523  WBC 7.7 7.5 12.1* 8.6 7.4  NEUTROABS 5.2  --  10.4*  --   --   HGB 10.1* 8.9* 9.6* 8.2* 8.2*  HCT 30.3* 26.5* 29.9* 26.2* 24.4*  MCV 92.9 93.6 93.7 94.2 92.8  PLT 207 169 270 261 237   Cardiac Enzymes:  Recent Labs Lab 04/12/15 1901 04/13/15 0529  TROPONINI 0.11* 0.10*   BNP (last 3 results) No results for input(s): BNP in the last 8760 hours.  ProBNP (last 3 results) No results for input(s): PROBNP in the last 8760 hours.  CBG:  Recent Labs Lab 04/13/15 2209 04/13/15 2300 04/14/15 0243 04/14/15 0309 04/14/15 0747  GLUCAP 35* 107* 40* 80 102*    Recent Results (from the past 240 hour(s))  MRSA PCR Screening     Status: None   Collection Time: 04/07/15 10:29 PM  Result Value Ref Range Status   MRSA by PCR NEGATIVE NEGATIVE Final    Comment:        The GeneXpert MRSA Assay (FDA approved for NASAL specimens only), is one component of a comprehensive MRSA colonization surveillance program. It is not intended to diagnose MRSA infection nor to guide or monitor treatment for MRSA infections.   Culture, blood (routine x 2) Call MD if unable to obtain prior to antibiotics being given     Status: None (Preliminary result)   Collection Time: 04/12/15 12:12 PM  Result Value Ref Range Status   Specimen Description BLOOD RIGHT HAND  Final   Special Requests IN PEDIATRIC BOTTLE 1CC  Final   Culture NO GROWTH < 24 HOURS  Final   Report Status PENDING  Incomplete  Culture, blood (routine x 2) Call MD if unable to obtain prior to antibiotics being given     Status: None (Preliminary result)   Collection Time: 04/12/15  1:14 PM  Result Value Ref Range Status   Specimen Description BLOOD RIGHT HAND  Final   Special Requests IN  PEDIATRIC BOTTLE 2CC  Final   Culture NO GROWTH < 12 HOURS  Final   Report Status PENDING  Incomplete  Gram stain     Status: None   Collection Time: 04/12/15  1:55 PM  Result Value Ref Range Status   Specimen Description TRACHEAL SITE  Final   Special Requests NONE  Final   Gram Stain   Final    FEW WBC PRESENT,BOTH PMN AND MONONUCLEAR MODERATE GRAM POSITIVE RODS FEW GRAM POSITIVE COCCI IN PAIRS FEW GRAM NEGATIVE RODS SQUAMOUS EPITHELIAL CELLS PRESENT    Report Status 04/12/2015 FINAL  Final  Culture, respiratory (NON-Expectorated)     Status: None (Preliminary result)   Collection Time: 04/12/15  1:55 PM  Result Value Ref Range Status   Specimen Description SPUTUM  Final   Special Requests NONE  Final  Gram Stain   Final    FEW WBC PRESENT,BOTH PMN AND MONONUCLEAR MODERATE SQUAMOUS EPITHELIAL CELLS PRESENT MODERATE GRAM POSITIVE RODS FEW GRAM POSITIVE COCCI IN PAIRS FEW GRAM NEGATIVE RODS [facility] Performed at [facility]    Culture PENDING  Incomplete   Report Status PENDING  Incomplete     Studies: Dg Chest 1 View  04/13/2015  CLINICAL DATA:  I50.9 (ICD-10-CM) - CHF (congestive heart failure) (HCC) J18.9 (ICD-10-CM) - PNA (pneumonia) cough EXAM: CHEST  1 VIEW COMPARISON:  the previous day's study FINDINGS: Asymmetric interstitial and alveolar opacities most prominent in the right upper lobe as before. Heart size upper limits normal for technique. No definite effusion. No pneumothorax. Visualized skeletal structures are unremarkable. IMPRESSION: 1. Little change in asymmetric infiltrates or edema. Electronically Signed   By: Corlis Leak  Hassell M.D.   On: 04/13/2015 13:05   Dg Chest 2 View  04/12/2015  CLINICAL DATA:  Shortness of breath and tachycardia EXAM: CHEST  2 VIEW COMPARISON:  July 10, 2014 FINDINGS: There is patchy airspace consolidation throughout much of the right lung, primarily in the right upper lobe anteriorly. Left lung is clear.  Heart is upper normal in size with pulmonary vascularity within normal limits. No adenopathy. There are no apparent bone lesions. IMPRESSION: Extensive infiltrate on the right, most notably in the anterior segment right upper lobe. Left lung clear. No adenopathy evident. Followup PA and lateral chest radiographs recommended in 3-4 weeks following trial of antibiotic therapy to ensure resolution and exclude underlying malignancy. Electronically Signed   By: Bretta BangWilliam  Woodruff III M.D.   On: 04/12/2015 09:55    Scheduled Meds: . albuterol  2.5 mg Nebulization QID  . amiodarone  300 mg Oral QHS  . amLODipine  10 mg Oral Daily  . aspirin EC  81 mg Oral Daily  . atropine  1 drop Left Eye q12n4p  . calcium acetate  1,334 mg Oral TID WC   And  . calcium acetate  667 mg Oral With snacks  . ceFEPime (MAXIPIME) IV  2 g Intravenous Q M,W,F-2000  . cinacalcet  60 mg Oral QHS  . dextrose      . docusate sodium  100 mg Oral BID  . dorzolamide-timolol  1 drop Right Eye BID  . doxercalciferol  5 mcg Intravenous Q M,W,F-HD  . insulin aspart  0-8 Units Subcutaneous TID WC  . irbesartan  150 mg Oral Daily  . latanoprost  1 drop Left Eye QHS  . multivitamin with minerals  1 tablet Oral Daily  . prednisoLONE acetate  1 drop Left Eye BID  . simvastatin  20 mg Oral QPM  . sodium chloride flush  3 mL Intravenous Q12H  . tobramycin-dexamethasone  1 drop Left Eye QID  . vancomycin  1,000 mg Intravenous Q M,W,F-HD  . Warfarin - Pharmacist Dosing Inpatient   Does not apply q1800   Continuous Infusions: . dextrose 75 mL/hr at 04/14/15 0352   Antibiotics Given (last 72 hours)    Date/Time Action Medication Dose Rate   04/12/15 2155 Given   ceFEPIme (MAXIPIME) 2 g in dextrose 5 % 50 mL IVPB 2 g 100 mL/hr      Principal Problem:   Acute respiratory failure (HCC) Active Problems:   Hypertension   Diabetes mellitus with ESRD (end-stage renal disease) (HCC)   ESRD (end stage renal disease) (HCC)   Sinus  bradycardia-tachycardia syndrome (HCC)   HCAP (healthcare-associated pneumonia)   Elevated troponin  Anemia    Time spent: 25 min    Tzipora Mcinroy U [facility]  [facility] Pager [phone]. If 7PM-7AM, please contact night-coverage at [email or web address], password [facility] 04/14/2015, 9:06 AM  LOS: 1 day

## 2015-04-15 DIAGNOSIS — I4892 Unspecified atrial flutter: Secondary | ICD-10-CM | POA: Insufficient documentation

## 2015-04-15 LAB — CULTURE, RESPIRATORY

## 2015-04-15 LAB — CULTURE, RESPIRATORY W GRAM STAIN: Culture: NORMAL

## 2015-04-15 LAB — GLUCOSE, CAPILLARY
GLUCOSE-CAPILLARY: 159 mg/dL — AB (ref 65–99)
Glucose-Capillary: 91 mg/dL (ref 65–99)
Glucose-Capillary: 186 mg/dL — ABNORMAL HIGH (ref 65–99)
Glucose-Capillary: 152 mg/dL — ABNORMAL HIGH (ref 65–99)

## 2015-04-15 LAB — PROTIME-INR
INR: 2.82 — ABNORMAL HIGH (ref 0.00–1.49)
Prothrombin Time: 29.3 seconds — ABNORMAL HIGH (ref 11.6–15.2)

## 2015-04-15 MED ORDER — ALBUTEROL SULFATE (2.5 MG/3ML) 0.083% IN NEBU
2.5000 mg | INHALATION_SOLUTION | Freq: Three times a day (TID) | RESPIRATORY_TRACT | Status: DC
  Administered 2015-04-16: 2.5 mg via RESPIRATORY_TRACT
  Filled 2015-04-15 (×2): qty 3

## 2015-04-15 MED ORDER — BISOPROLOL FUMARATE 5 MG PO TABS
10.0000 mg | ORAL_TABLET | Freq: Every day | ORAL | Status: DC
  Administered 2015-04-15 – 2015-04-16 (×2): 10 mg via ORAL
  Filled 2015-04-15 (×2): qty 2

## 2015-04-15 MED ORDER — WARFARIN SODIUM 5 MG PO TABS
5.0000 mg | ORAL_TABLET | Freq: Once | ORAL | Status: AC
  Administered 2015-04-15: 5 mg via ORAL
  Filled 2015-04-15: qty 1

## 2015-04-15 MED ORDER — ALBUTEROL SULFATE (2.5 MG/3ML) 0.083% IN NEBU
2.5000 mg | INHALATION_SOLUTION | RESPIRATORY_TRACT | Status: DC | PRN
  Administered 2015-04-16: 2.5 mg via RESPIRATORY_TRACT
  Filled 2015-04-15: qty 3

## 2015-04-15 NOTE — Progress Notes (Signed)
Patient Name: [name] [name] Date of Encounter: 04/15/2015  Principal Problem:   Acute respiratory failure (HCC) Active Problems:   Hypertension   Diabetes mellitus with ESRD (end-stage renal disease) (HCC)   ESRD (end stage renal disease) (HCC)   Sinus bradycardia-tachycardia syndrome (HCC)   HCAP (healthcare-associated pneumonia)   Elevated troponin   Anemia   Primary Cardiologist: Dr. Katrinka BlazingSmith  Patient Profile: [name] [name] is a 82 year old male with past medical history of ESRD with HD on M/W/F, DM, Afib on coumadin, and HTN. [facility] with weakness and inability to walk. Chest X ray shows RUL PNA. He went into Aflutter with rate of 130's on 04/14/15 and was started on Amio gtt.   SUBJECTIVE: He is more alert today, says he feels ok. Denies chest pain and palpitations.  Says his breathing is still "not good".    OBJECTIVE Filed Vitals:   04/15/15 0300 04/15/15 0400 04/15/15 0500 04/15/15 0600  BP: 105/87 125/89 100/88 124/82  Pulse: 116 98 119 102  Temp:  98 F (36.7 C)    TempSrc:  Oral    Resp: 14 20 27 22   Height:      Weight:      SpO2: 94% 93% 96% 100%    Intake/Output Summary (Last 24 hours) at 04/15/15 0740 Last data filed at 04/15/15 0700  Gross per 24 hour  Intake 2562.66 ml  Output    -78 ml  Net 2640.66 ml   Filed Weights   04/13/15 1943 04/14/15 1150 04/14/15 1600  Weight: 225 lb 5 oz (102.2 kg) 230 lb 13.2 oz (104.7 kg) 232 lb 2.3 oz (105.3 kg)    PHYSICAL EXAM General: Well developed, well nourished, male in no acute distress. Head: Normocephalic, atraumatic.  Neck: Supple without bruits, No JVD. Lungs:  Resp regular and unlabored, Coarse rhonchi in bilateral upper lobes, diminished rhonchi in bases.  Heart: Irregular rhythm,S1, S2, No murmur; no rub. Abdomen: Soft, non-tender, non-distended, BS + x 4.  Extremities: No clubbing, cyanosis, No edema.  Neuro: Alert and oriented X 3. Moves all extremities  spontaneously. Psych: Normal affect.  LABS: CBC: Recent Labs  04/12/15 1001  04/14/15 0523 04/14/15 1028  WBC 12.1*  < > 7.4 8.2  NEUTROABS 10.4*  --   --   --   HGB 9.6*  < > 8.2* 8.6*  HCT 29.9*  < > 24.4* 25.5*  MCV 93.7  < > 92.8 93.1  PLT 270  < > 237 260  < > = values in this interval not displayed. INR: Recent Labs  04/15/15 0350  INR 2.82*   Basic Metabolic Panel: Recent Labs  04/14/15 0523 04/14/15 1028  NA 136 135  K 4.0 4.0  CL 94* 94*  CO2 27 27  GLUCOSE 118* 100*  BUN 48* 51*  CREATININE 10.63* 11.14*  CALCIUM 9.4 9.5  PHOS  --  5.6*   Liver Function Tests: Recent Labs  04/13/15 0529 04/14/15 1028  AST 34  --   ALT 30  --   ALKPHOS 46  --   BILITOT 0.6  --   PROT 6.9  --   ALBUMIN 2.4* 2.2*   Cardiac Enzymes: Recent Labs  04/12/15 1901 04/13/15 0529  TROPONINI 0.11* 0.10*    Recent Labs  04/12/15 1008  TROPIPOC 0.22*   BNP: No results found for: BNP PRO B NATRIURETIC PEPTIDE (BNP)  Date/Time Value Ref Range Status  11/05/2013 07:35 PM  8040.0* 0 - 450 pg/mL Final  01/10/2012 04:58 PM 3616.0* 0 - 450 pg/mL Final   Hemoglobin A1C: Recent Labs  04/12/15 1901  HGBA1C 6.3*    TELE: Afib/flutter, rate 100's.        ECG: Afib  Echo: Dec. 2013 - Left ventricle: The cavity size was normal. There was mild concentric hypertrophy. Systolic function was normal. The estimated ejection fraction was in the range of 55% to 60%. Wall motion was normal; there were no regional wall motion abnormalities. - Mitral valve: Mild regurgitation. - Right ventricle: The cavity size was mildly dilated. Wall thickness was normal. - Right atrium: The atrium was mildly dilated  Radiology/Studies: Dg Chest 1 View  04/13/2015  CLINICAL DATA:  I50.9 (ICD-10-CM) - CHF (congestive heart failure) (HCC) J18.9 (ICD-10-CM) - PNA (pneumonia) cough EXAM: CHEST  1 VIEW COMPARISON:  the previous day's study FINDINGS: Asymmetric interstitial and  alveolar opacities most prominent in the right upper lobe as before. Heart size upper limits normal for technique. No definite effusion. No pneumothorax. Visualized skeletal structures are unremarkable. IMPRESSION: 1. Little change in asymmetric infiltrates or edema. Electronically Signed   By: Corlis Leak M.D.   On: 04/13/2015 13:05     Current Medications:  . albuterol  2.5 mg Nebulization QID  . amLODipine  10 mg Oral Daily  . aspirin EC  81 mg Oral Daily  . atropine  1 drop Left Eye q12n4p  . calcium acetate  1,334 mg Oral TID WC   And  . calcium acetate  667 mg Oral With snacks  . ceFEPime (MAXIPIME) IV  2 g Intravenous Q M,W,F-2000  . cinacalcet  60 mg Oral QHS  . docusate sodium  100 mg Oral BID  . dorzolamide-timolol  1 drop Right Eye BID  . doxercalciferol  5 mcg Intravenous Q M,W,F-HD  . insulin aspart  0-8 Units Subcutaneous TID WC  . irbesartan  150 mg Oral Daily  . latanoprost  1 drop Left Eye QHS  . metoprolol tartrate  25 mg Oral BID  . multivitamin with minerals  1 tablet Oral Daily  . prednisoLONE acetate  1 drop Left Eye BID  . simvastatin  20 mg Oral QPM  . sodium chloride flush  3 mL Intravenous Q12H  . tobramycin-dexamethasone  1 drop Left Eye QID  . vancomycin  1,000 mg Intravenous Q M,W,F-HD  . Warfarin - Pharmacist Dosing Inpatient   Does not apply q1800   . amiodarone 30 mg/hr (04/14/15 2314)    ASSESSMENT AND PLAN: Principal Problem:   Acute respiratory failure (HCC) Active Problems:   Hypertension   Diabetes mellitus with ESRD (end-stage renal disease) (HCC)   ESRD (end stage renal disease) (HCC)   Sinus bradycardia-tachycardia syndrome (HCC)   HCAP (healthcare-associated pneumonia)   Elevated troponin   Anemia  1. Aflutter - Better rate control today (90-100).  - Md to advise transition to po Amio  - On coumadin, held last night due to elevated INR, pharmacy is monitoring.   2. RLL PNA - Mgmt per IM. - On antibiotics - Suspect that this  is driving his Aflutter.    Signed, Little Ishikawa , NP   7:40 AM 04/15/2015  I have seen and examined the patient along with Little Ishikawa , NP.  I have reviewed the chart, notes and new data.  I agree with PA/NP's note.  Clearly more alert and less respiratory difficulty, still wheezing. Rate control improved, still suboptimal. Keep on IV  amio today and add very low dose selective beta blocker.   Thurmon Fair, MD, [facility] [phone] 04/15/2015, 10:50 AM

## 2015-04-15 NOTE — Progress Notes (Signed)
PT Cancellation Note  Patient Details Name: Orlene ErmRichard G Shake MRN: [id number] DOB: 31-Mar-1933   Cancelled Treatment:    Reason Eval/Treat Not Completed: Medical issues which prohibited therapy.  Patient with increased HR at rest of 119. On amiodarone drip.  RN [facility]  Will hold PT today and return tomorrow for PT session as appropriated.   [name], Donae Kueker H 04/15/2015, 2:17 PM [name], PT, [facility] Acute Rehab Services Pager [phone]

## 2015-04-15 NOTE — Progress Notes (Signed)
Assessment/Plan: 1. HCAP: Per primary, continue Vanc & Maxipime. Tmax 99. On RA.  2. ESRD -MWF (MonWedFri, 4 hrs 0 min, 180NRe Optiflux, BFR 450, DFR Autoflow 1.5, EDW 104 (kg), Dialysate 3.0 K, 2.25) HD Friday 3. HTN/volume -.  4. H/O Afib/flutter: Coumadin per pharmacy  Subjective: Interval History: HR has slowed  Objective: Vital signs in last 24 hours: Temp:  [97.9 F (36.6 [name])-98.6 F (37 [name])] 98.4 F (36.9 [name]) (03/16 0747) Pulse Rate:  [98-139] 105 (03/16 0747) Resp:  [14-27] 19 (03/16 0747) BP: (90-142)/(68-98) 135/98 mmHg (03/16 0747) SpO2:  [92 %-100 %] 92 % (03/16 0747) Weight:  [104.7 kg (230 lb 13.2 oz)-105.3 kg (232 lb 2.3 oz)] 105.3 kg (232 lb 2.3 oz) (03/15 1600) Weight change: 2.5 kg (5 lb 8.2 oz)  Intake/Output from previous day: 03/15 0701 - 03/16 0700 In: 2562.7 [P.O.:360; I.V.:2152.7; IV Piggyback:50] Out: -78  Intake/Output this shift: Total I/O In: 2440.4 [P.O.:240; I.V.:2200.4] Out: -   General appearance: alert and cooperative Resp: clear to auscultation bilaterally Cardio: regular rate and rhythm, S1, S2 normal, no murmur, click, rub or gallop Extremities: LUE AV access  Lab Results:  Recent Labs  04/14/15 0523 04/14/15 1028  WBC 7.4 8.2  HGB 8.2* 8.6*  HCT 24.4* 25.5*  PLT 237 260   BMET:  Recent Labs  04/14/15 0523 04/14/15 1028  NA 136 135  K 4.0 4.0  CL 94* 94*  CO2 27 27  GLUCOSE 118* 100*  BUN 48* 51*  CREATININE 10.63* 11.14*  CALCIUM 9.4 9.5   No results for input(s): PTH in the last 72 hours. Iron Studies: No results for input(s): IRON, TIBC, TRANSFERRIN, FERRITIN in the last 72 hours. Studies/Results: Dg Chest 1 View  04/13/2015  CLINICAL DATA:  I50.9 (ICD-10-CM) - CHF (congestive heart failure) (HCC) J18.9 (ICD-10-CM) - PNA (pneumonia) cough EXAM: CHEST  1 VIEW COMPARISON:  the previous day's study FINDINGS: Asymmetric interstitial and alveolar opacities most prominent in the right upper lobe as before. Heart size  upper limits normal for technique. No definite effusion. No pneumothorax. Visualized skeletal structures are unremarkable. IMPRESSION: 1. Little change in asymmetric infiltrates or edema. Electronically Signed   By: Corlis Leak  Hassell M.D.   On: 04/13/2015 13:05    Scheduled: . albuterol  2.5 mg Nebulization QID  . amLODipine  10 mg Oral Daily  . aspirin EC  81 mg Oral Daily  . atropine  1 drop Left Eye q12n4p  . bisoprolol  10 mg Oral Daily  . calcium acetate  1,334 mg Oral TID WC   And  . calcium acetate  667 mg Oral With snacks  . ceFEPime (MAXIPIME) IV  2 g Intravenous Q M,W,F-2000  . cinacalcet  60 mg Oral QHS  . docusate sodium  100 mg Oral BID  . dorzolamide-timolol  1 drop Right Eye BID  . doxercalciferol  5 mcg Intravenous Q M,W,F-HD  . insulin aspart  0-8 Units Subcutaneous TID WC  . irbesartan  150 mg Oral Daily  . latanoprost  1 drop Left Eye QHS  . multivitamin with minerals  1 tablet Oral Daily  . prednisoLONE acetate  1 drop Left Eye BID  . simvastatin  20 mg Oral QPM  . sodium chloride flush  3 mL Intravenous Q12H  . tobramycin-dexamethasone  1 drop Left Eye QID  . vancomycin  1,000 mg Intravenous Q M,W,F-HD  . warfarin  5 mg Oral ONCE-1800  . Warfarin - Pharmacist Dosing Inpatient   Does not  apply q1800     LOS: 2 days   [name] [name] 04/15/2015,11:03 AM

## 2015-04-15 NOTE — Progress Notes (Addendum)
Pharmacy Antibiotic + Anticoagulation Note  [name] ErmRichard G [name] is a 82 y.o. male admitted on 04/12/2015 with pneumonia.  Pharmacy has been consulted for vancomycin dosing. Afeb, other labs pending. ESRD on HD, makes small amounts of urine.   On warfarin PTA for AFib. PTA dose: 10 mg Thursdays, 7.5 mg po all other days. Pt was admitted with surpatherapeutic INR 4.29, last dose of warfarin was on 3/12 PTA, current INR 2.8.   Plan: Vanc 1 g IV qHD - MWF  -- consider discontinuing Cefepime 2 g IV qHD - MWF Monitor clinical progress, c/s, renal function, abx plan/LOT Pre-HD vanc level as indicated Warfarin 5 mg po x1 Daily INR    Height: 6\' 4"  (193 cm) Weight: 232 lb 2.3 oz (105.3 kg) IBW/kg (Calculated) : 86.8  Temp (24hrs), Avg:98.3 F (36.8 C), Min:97.9 F (36.6 C), Max:98.6 F (37 C)   Recent Labs Lab 04/12/15 1001 04/12/15 1018 04/13/15 0529 04/14/15 0523 04/14/15 1028  WBC 12.1*  --  8.6 7.4 8.2  CREATININE 13.16*  --  8.54* 10.63* 11.14*  LATICACIDVEN  --  1.38  --   --   --     Estimated Creatinine Clearance: 7.2 mL/min (by C-G formula based on Cr of 11.14).    Allergies  Allergen Reactions  . Diltiazem Other (See Comments)    SEVERE STOMACH CRAMPING PER PT    Antimicrobials this admission: 3/13 vanc >>  3/13 cefepime >>  Dose adjustments this admission: n/a  Microbiology results:  3/13 Resp Cx >> NF 3/13 BCx >> ngtd   [name] [name], PharmD, BCPS Clinical Pharmacist Pager: [phone] 04/15/2015 9:40 AM

## 2015-04-15 NOTE — Clinical Social Work Note (Signed)
Clinical Social Work Assessment  Patient Details  Name: [name] [name] MRN: [id number] Date of Birth: 11-18-1933  Date of referral:  04/15/15               Reason for consult:  Discharge Planning                Permission sought to share information with:  Case Manager, Family Supports Permission granted to share information::  Yes, Verbal Permission Granted  Name::      Dierdre Harness(Barbara Evan Wife [phone])  Agency::     Relationship::     Contact Information:     Housing/Transportation Living arrangements for the past 2 months:  Single Family Home Source of Information:  Patient Patient Interpreter Needed:  None Criminal Activity/Legal Involvement Pertinent to Current Situation/Hospitalization:  No - Comment as needed Significant Relationships:  Spouse Lives with:  Spouse Do you feel safe going back to the place where you live?  Yes Need for family participation in patient care:  Yes (Comment)  Care giving concerns: refusing SNF and home health   Social Worker assessment / plan:  BSW intern entered room. Patient was alert and oriented with wife at bedside. BSW intern convinced patient that going to SNF or home health will help him obtain some rehab to get better and strong to be able to do for himself. Patient gave in and decided to go with home health. His wife said she will help as well. Patient receive dialysis on Monday Wednesday and Friday.  [place] For transportation patient uses [employer]. Patient did ask if the medicine he on for his blood thinner could it possible be changed to something else. Patient is going to ask MD. BSW intern left contact inforamtion for wife and patient for any other questions or concerns.   Employment status:  Retired Database administratornsurance information:  Managed Medicare PT Recommendations:  Skilled Nursing Facility Information / Referral to community resources:  Acute Rehab  Patient/Family's Response to care:  Patient is agreeable to  home health being set up at home.   Patient/Family's Understanding of and Emotional Response to Diagnosis, Current Treatment, and Prognosis:  Patient and patient wife is aware of patient current treatment and planning.   Emotional Assessment Appearance:  Appears stated age Attitude/Demeanor/Rapport:   (nice, welcoming,friendly) Affect (typically observed):  Accepting, Blunt, Guarded Orientation:  Oriented to Place, Oriented to Self, Oriented to Situation Alcohol / Substance use:  Never Used Psych involvement (Current and /or in the community):  No (Comment)  Discharge Needs  Concerns to be addressed:  No discharge needs identified Readmission within the last 30 days:  No Current discharge risk:  None Barriers to Discharge:  No Barriers Identified   Catheryn BaconCharlean McNeil  BSW intern  [phone]   CSW reviewed assessment and agrees with its findings  Merlyn LotJenna Holoman, [facility] WinchesterCSWA Clinical Social Worker [phone]

## 2015-04-15 NOTE — Progress Notes (Signed)
PROGRESS NOTE  [name] [name]:[id number] DOB: 14-Jul-1933 DOA: 04/12/2015 PCP: Katy Apo, MD  [name] [name] is a 81 y.o. male with past medical history that includes diabetes, A. Fib on coumadin, hypertension, end-stage renal disease on Monday Wednesday Friday dialysis patient presents to the emergency department with chief complaint of generalized weakness and inability to ambulate. Initial evaluation in the emergency department reveals pneumonia.  He recently was discharged from the hospital after a brief stay for A. fib with RVR. He states he was in his usual state of health until yesterday he gradually felt generalized weakness. He typically uses a walker for ambulation and this morning he awakened and was unable to bear weight. He states his legs "just wouldn't hold me".  Suspect due to hypoglycemia.  Patient was on glucotrol at home-- WOULD NOT RESTART.  PT saw and recommended SNF but patient and family refused.  They also refused home health.    Assessment/Plan: Acute respiratory failure with hypoxia likely secondary to healthcare associated pneumonia per chest x-ray.  No chest pain. - day 4 of vancomycin and cefepime -influenza panel negative - sputum Culture normal oropharyngeal flora -Oxygen supplementation- wean as able -Nebulizers every 4 hours -home O2 as needed.  - d/c vancomycin today and complete the course of cefepime.   Healthcare associated pneumonia. Per chest x-ray.  -Antibiotics as noted above -Blood cultures pending and negative till date.  -speech therapy to evaluate for silent aspiration, recommended no follow up needed at this time.  -physical therapy recommended SNF, but pt refused.  -Followup PA and lateral chest radiographs recommended in 3-4 weeks following trial of antibiotic therapy to ensure resolution and exclude underlying malignancy.  Hypoglycemia -holding home meds- glucotrol  - CBG (last 3)   Recent Labs  04/15/15 0746 04/15/15 1258  04/15/15 1627  GLUCAP 91 159* 186*    cbg's improving.    Elevated troponin. Likely related to #1 the setting of end-stage renal disease. Denies chest pain. Troponin 0.22. KG without acute changes -no chest pain   End-stage renal disease. He is a Monday Wednesday Friday dialysis patient. Creatinine 13.6 on admission -Nephrology consulted-3L removed on 3/13  Diabetes. On oral agents at home. Serum glucose 135 on admission but dropped once admitted -hemoglobin A1c: 6.3 CBG (last 3)   Recent Labs  04/15/15 0746 04/15/15 1258 04/15/15 1627  GLUCAP 91 159* 186*       History of A. Fib. Chadvasc score 5. EKG shows normal sinus rhythm. Home medications include Coumadin and amiodarone. creatinine 4.26 on admission -Monitor on telemetry - cardiology consulted and recommended IV amiodarone, added low dose bb today. Plan to transition to oral amiodarone soon.   Hypertension. Controlled. -Continue home meds  Anemia. Likely related to chronic disease.  -monitor for bleeding   Code Status: partial Family Communication: wife at bedside, updated patient and family.  Disposition Plan: home O2 eval- patient refusing SNF and home health   Consultants:  Renal  Cardiology  PT  Procedures:  dialysis    HPI/Subjective: Pt denies any chest pain sob.   Objective: Filed Vitals:   04/15/15 1300 04/15/15 1628  BP: 141/89 145/84  Pulse: 81   Temp: 97.9 F (36.6 C) 98.3 F (36.8 C)  Resp: 17 18    Intake/Output Summary (Last 24 hours) at 04/15/15 1902 Last data filed at 04/15/15 1600  Gross per 24 hour  Intake 6091.1 ml  Output      0 ml  Net 6091.1 ml  Filed Weights   04/13/15 1943 04/14/15 1150 04/14/15 1600  Weight: 102.2 kg (225 lb 5 oz) 104.7 kg (230 lb 13.2 oz) 105.3 kg (232 lb 2.3 oz)    Exam:   General:  Awake, NAD- on 3L O2  Cardiovascular: s1s2. RRR  Respiratory: rales, no wheezing  Abdomen: +BS, soft  Musculoskeletal: + edema   Data  Reviewed: Basic Metabolic Panel:  Recent Labs Lab 04/12/15 1001 04/13/15 0529 04/14/15 0523 04/14/15 1028  NA 141 138 136 135  K 4.4 3.5 4.0 4.0  CL 96* 96* 94* 94*  CO2 26 28 27 27   GLUCOSE 135* 97 118* 100*  BUN 58* 31* 48* 51*  CREATININE 13.16* 8.54* 10.63* 11.14*  CALCIUM 10.1 9.3 9.4 9.5  PHOS  --   --   --  5.6*   Liver Function Tests:  Recent Labs Lab 04/13/15 0529 04/14/15 1028  AST 34  --   ALT 30  --   ALKPHOS 46  --   BILITOT 0.6  --   PROT 6.9  --   ALBUMIN 2.4* 2.2*   No results for input(s): LIPASE, AMYLASE in the last 168 hours. No results for input(s): AMMONIA in the last 168 hours. CBC:  Recent Labs Lab 04/12/15 1001 04/13/15 0529 04/14/15 0523 04/14/15 1028  WBC 12.1* 8.6 7.4 8.2  NEUTROABS 10.4*  --   --   --   HGB 9.6* 8.2* 8.2* 8.6*  HCT 29.9* 26.2* 24.4* 25.5*  MCV 93.7 94.2 92.8 93.1  PLT 270 261 237 260   Cardiac Enzymes:  Recent Labs Lab 04/12/15 1901 04/13/15 0529  TROPONINI 0.11* 0.10*   BNP (last 3 results) No results for input(s): BNP in the last 8760 hours.  ProBNP (last 3 results) No results for input(s): PROBNP in the last 8760 hours.  CBG:  Recent Labs Lab 04/14/15 0946 04/14/15 2133 04/15/15 0746 04/15/15 1258 04/15/15 1627  GLUCAP 95 201* 91 159* 186*    Recent Results (from the past 240 hour(s))  MRSA PCR Screening     Status: None   Collection Time: 04/07/15 10:29 PM  Result Value Ref Range Status   MRSA by PCR NEGATIVE NEGATIVE Final    Comment:        The GeneXpert MRSA Assay (FDA approved for NASAL specimens only), is one component of a comprehensive MRSA colonization surveillance program. It is not intended to diagnose MRSA infection nor to guide or monitor treatment for MRSA infections.   Culture, blood (routine x 2) Call MD if unable to obtain prior to antibiotics being given     Status: None (Preliminary result)   Collection Time: 04/12/15 12:12 PM  Result Value Ref Range Status     Specimen Description BLOOD RIGHT HAND  Final   Special Requests IN PEDIATRIC BOTTLE 1CC  Final   Culture NO GROWTH 3 DAYS  Final   Report Status PENDING  Incomplete  Culture, blood (routine x 2) Call MD if unable to obtain prior to antibiotics being given     Status: None (Preliminary result)   Collection Time: 04/12/15  1:14 PM  Result Value Ref Range Status   Specimen Description BLOOD RIGHT HAND  Final   Special Requests IN PEDIATRIC BOTTLE 2CC  Final   Culture NO GROWTH 3 DAYS  Final   Report Status PENDING  Incomplete  Gram stain     Status: None   Collection Time: 04/12/15  1:55 PM  Result Value Ref Range Status   Specimen  Description TRACHEAL SITE  Final   Special Requests NONE  Final   Gram Stain   Final    FEW WBC PRESENT,BOTH PMN AND MONONUCLEAR MODERATE GRAM POSITIVE RODS FEW GRAM POSITIVE COCCI IN PAIRS FEW GRAM NEGATIVE RODS SQUAMOUS EPITHELIAL CELLS PRESENT    Report Status 04/12/2015 FINAL  Final  Culture, respiratory (NON-Expectorated)     Status: None   Collection Time: 04/12/15  1:55 PM  Result Value Ref Range Status   Specimen Description SPUTUM  Final   Special Requests NONE  Final   Gram Stain   Final    FEW WBC PRESENT,BOTH PMN AND MONONUCLEAR MODERATE SQUAMOUS EPITHELIAL CELLS PRESENT MODERATE GRAM POSITIVE RODS FEW GRAM POSITIVE COCCI IN PAIRS FEW GRAM NEGATIVE RODS Performed at [facility] Performed at [facility]    Culture   Final    NORMAL OROPHARYNGEAL FLORA Performed at [employer] [facility]    Report Status 04/15/2015 FINAL  Final  MRSA PCR Screening     Status: None   Collection Time: 04/14/15  7:10 PM  Result Value Ref Range Status   MRSA by PCR NEGATIVE NEGATIVE Final    Comment:        The GeneXpert MRSA Assay (FDA approved for NASAL specimens only), is one component of a comprehensive MRSA colonization surveillance program. It is not intended to diagnose MRSA infection nor to guide or monitor treatment  for MRSA infections.      Studies: No results found.  Scheduled Meds: . albuterol  2.5 mg Nebulization TID  . amLODipine  10 mg Oral Daily  . aspirin EC  81 mg Oral Daily  . atropine  1 drop Left Eye q12n4p  . bisoprolol  10 mg Oral Daily  . calcium acetate  1,334 mg Oral TID WC   And  . calcium acetate  667 mg Oral With snacks  . ceFEPime (MAXIPIME) IV  2 g Intravenous Q M,W,F-2000  . cinacalcet  60 mg Oral QHS  . docusate sodium  100 mg Oral BID  . dorzolamide-timolol  1 drop Right Eye BID  . doxercalciferol  5 mcg Intravenous Q M,W,F-HD  . insulin aspart  0-8 Units Subcutaneous TID WC  . irbesartan  150 mg Oral Daily  . latanoprost  1 drop Left Eye QHS  . multivitamin with minerals  1 tablet Oral Daily  . prednisoLONE acetate  1 drop Left Eye BID  . simvastatin  20 mg Oral QPM  . sodium chloride flush  3 mL Intravenous Q12H  . tobramycin-dexamethasone  1 drop Left Eye QID  . vancomycin  1,000 mg Intravenous Q M,W,F-HD  . Warfarin - Pharmacist Dosing Inpatient   Does not apply q1800   Continuous Infusions: . amiodarone 30 mg/hr (04/15/15 1300)   Antibiotics Given (last 72 hours)    Date/Time Action Medication Dose Rate   04/12/15 2155 Given   ceFEPIme (MAXIPIME) 2 g in dextrose 5 % 50 mL IVPB 2 g 100 mL/hr   04/14/15 1502 Given   vancomycin (VANCOCIN) IVPB 1000 mg/200 mL premix 1,000 mg 200 mL/hr   04/14/15 2137 Given   ceFEPIme (MAXIPIME) 2 g in dextrose 5 % 50 mL IVPB 2 g 100 mL/hr      Principal Problem:   Acute respiratory failure (HCC) Active Problems:   Hypertension   Diabetes mellitus with ESRD (end-stage renal disease) (HCC)   ESRD (end stage renal disease) (HCC)   Sinus bradycardia-tachycardia syndrome (HCC)   HCAP (healthcare-associated pneumonia)  Elevated troponin   Anemia   Atrial flutter with rapid ventricular response (HCC)    Time spent: 25 min    Nairi Oswald  [facility] Pager [phone] If 7PM-7AM, please contact  night-coverage at [email or web address], password [facility] 04/15/2015, 7:02 PM  LOS: 2 days

## 2015-04-16 LAB — CBC
HCT: 24.9 % — ABNORMAL LOW (ref 39.0–52.0)
HEMOGLOBIN: 8.1 g/dL — AB (ref 13.0–17.0)
MCH: 29.8 pg (ref 26.0–34.0)
MCHC: 32.5 g/dL (ref 30.0–36.0)
MCV: 91.5 fL (ref 78.0–100.0)
PLATELETS: 284 10*3/uL (ref 150–400)
RBC: 2.72 MIL/uL — AB (ref 4.22–5.81)
RDW: 15.2 % (ref 11.5–15.5)
WBC: 8.1 10*3/uL (ref 4.0–10.5)

## 2015-04-16 LAB — BASIC METABOLIC PANEL
Anion gap: 15 (ref 5–15)
BUN: 42 mg/dL — AB (ref 6–20)
CHLORIDE: 92 mmol/L — AB (ref 101–111)
CO2: 24 mmol/L (ref 22–32)
Calcium: 9.6 mg/dL (ref 8.9–10.3)
Creatinine, Ser: 9.22 mg/dL — ABNORMAL HIGH (ref 0.61–1.24)
GFR calc non Af Amer: 5 mL/min — ABNORMAL LOW (ref >60)
GFR, EST AFRICAN AMERICAN: 5 mL/min — AB (ref >60)
GLUCOSE: 142 mg/dL — AB (ref 65–99)
POTASSIUM: 4.4 mmol/L (ref 3.5–5.1)
SODIUM: 131 mmol/L — AB (ref 135–145)

## 2015-04-16 LAB — GLUCOSE, CAPILLARY
GLUCOSE-CAPILLARY: 142 mg/dL — AB (ref 65–99)
GLUCOSE-CAPILLARY: 173 mg/dL — AB (ref 65–99)
Glucose-Capillary: 133 mg/dL — ABNORMAL HIGH (ref 65–99)
Glucose-Capillary: 136 mg/dL — ABNORMAL HIGH (ref 65–99)

## 2015-04-16 LAB — PROTIME-INR
INR: 2.01 — AB (ref 0.00–1.49)
PROTHROMBIN TIME: 22.7 s — AB (ref 11.6–15.2)

## 2015-04-16 MED ORDER — HEPARIN SODIUM (PORCINE) 1000 UNIT/ML DIALYSIS
20.0000 [IU]/kg | INTRAMUSCULAR | Status: DC | PRN
Start: 2015-04-16 — End: 2015-04-16
  Filled 2015-04-16: qty 3

## 2015-04-16 MED ORDER — BISOPROLOL FUMARATE 5 MG PO TABS: 5.0000 mg | ORAL_TABLET | Freq: Every day | ORAL | Status: DC

## 2015-04-16 MED ORDER — PENTAFLUOROPROP-TETRAFLUOROETH EX AERO: 1.0000 "application " | INHALATION_SPRAY | CUTANEOUS | Status: DC | PRN

## 2015-04-16 MED ORDER — ALTEPLASE 2 MG IJ SOLR: 2.0000 mg | Freq: Once | INTRAMUSCULAR | Status: DC | PRN

## 2015-04-16 MED ORDER — AMIODARONE HCL 200 MG PO TABS: 200.0000 mg | ORAL_TABLET | Freq: Every day | ORAL | Status: DC

## 2015-04-16 MED ORDER — DOXERCALCIFEROL 4 MCG/2ML IV SOLN
INTRAVENOUS | Status: AC
  Filled 2015-04-16: qty 4

## 2015-04-16 MED ORDER — SODIUM CHLORIDE 0.9 % IV SOLN: 100.0000 mL | INTRAVENOUS | Status: DC | PRN

## 2015-04-16 MED ORDER — LIDOCAINE-PRILOCAINE 2.5-2.5 % EX CREA: 1.0000 "application " | TOPICAL_CREAM | CUTANEOUS | Status: DC | PRN

## 2015-04-16 MED ORDER — WARFARIN SODIUM 5 MG PO TABS
7.5000 mg | ORAL_TABLET | Freq: Once | ORAL | Status: AC
  Administered 2015-04-16: 7.5 mg via ORAL
  Filled 2015-04-16: qty 1.5

## 2015-04-16 MED ORDER — AMIODARONE HCL 200 MG PO TABS
400.0000 mg | ORAL_TABLET | Freq: Every day | ORAL | Status: DC
  Administered 2015-04-16 – 2015-04-17 (×2): 400 mg via ORAL
  Filled 2015-04-16 (×2): qty 2

## 2015-04-16 MED ORDER — HEPARIN SODIUM (PORCINE) 1000 UNIT/ML DIALYSIS
1000.0000 [IU] | INTRAMUSCULAR | Status: DC | PRN
  Filled 2015-04-16: qty 1

## 2015-04-16 MED ORDER — LIDOCAINE HCL (PF) 1 % IJ SOLN: 5.0000 mL | INTRAMUSCULAR | Status: DC | PRN

## 2015-04-16 NOTE — Progress Notes (Signed)
[facility] notified of conversion to NSR/1st degree HB. EKG done. Awaiting response/new orders.  M.Mozella Rexrode, RN

## 2015-04-16 NOTE — Progress Notes (Signed)
ANTICOAGULATION CONSULT NOTE - Follow Up Consult  Pharmacy Consult for Warfarin Indication: atrial fibrillation  Allergies  Allergen Reactions  . Diltiazem Other (See Comments)    SEVERE STOMACH CRAMPING PER PT    Patient Measurements: Height: 6\' 4"  (193 cm) Weight: 232 lb 2.3 oz (105.3 kg) IBW/kg (Calculated) : 86.8  Vital Signs: Temp: 96.3 F (35.7 C) (03/17 1216) Temp Source: Axillary (03/17 1216) BP: 146/76 mmHg (03/17 1216) Pulse Rate: 59 (03/17 0750)  Labs:  Recent Labs  04/14/15 0523 04/14/15 0527 04/14/15 1028 04/15/15 0350 04/16/15 0822  HGB 8.2*  --  8.6*  --  8.1*  HCT 24.4*  --  25.5*  --  24.9*  PLT 237  --  260  --  284  LABPROT  --  34.8*  --  29.3* 22.7*  INR  --  3.56*  --  2.82* 2.01*  CREATININE 10.63*  --  11.14*  --  9.22*    Estimated Creatinine Clearance: 8.7 mL/min (by C-G formula based on Cr of 9.22).   Assessment: [age] YOM with ESRD on warfarin PTA for hx Afib. The patient was admitted with a SUPRAtherapeutic INR on the PTA dose. Pharmacy was consulted to manage warfarin dosing this admission.  INR down to 2.01 - 5mg  dose given last night. CBC stable - no overt s/sx of bleeding noted.   PTA dose noted to be 7.5mg  daily EXCEPT for 10 mg on Thursdays only. The patient was re-educated on warfarin today.  Goal of Therapy:  INR 2-3   Plan:  1. Warfarin 7.5mg  po x1 tonight.  2. Will continue to monitor for any signs/symptoms of bleeding and will follow up with PT/INR in the a.m.  3. If to d/c home, recommend lowering home dose to 7.5mg  daily except 5mg  MWF (this is a 20% reduction in prior home dose) with follow-up INR next week.   [name] [name], PharmD, BCPS Clinical Pharmacist [phone] 04/16/2015 2:08 PM

## 2015-04-16 NOTE — Telephone Encounter (Signed)
Close encounter 

## 2015-04-16 NOTE — Progress Notes (Signed)
Physical Therapy Treatment Patient Details Name: [name] [name] Oyervides MRN: [id number] DOB: May 11, 1933 Today's Date: 04/16/2015    History of Present Illness [name] [name] Swartzlander is a 82 y.o. male with past medical history that includes diabetes, A. Fib on coumadin, hypertension, end-stage renal disease on Monday Wednesday Friday dialysis patient presents to the emergency department with chief complaint of generalized weakness and inability to ambulate. Initial evaluation in the emergency department reveals pneumonia.    PT Comments    Pt admitted with above diagnosis. Pt presents with functional limitations due to decreased strength, endurance, and balance. Pt tolerated ambulation with the sara plus well but requires a lot of VC's for adequate steps and to stand more upright. Pt only able to walk very short distances at this time due to fatigue and needs chair follow for safety. Pt will continue to benefit from PT services to continue to improve functional mobility.    Follow Up Recommendations  SNF;Supervision/Assistance - 24 hour     Equipment Recommendations  Other (comment) (TBD. )    Recommendations for Other Services       Precautions / Restrictions Precautions Precautions: Fall Restrictions Weight Bearing Restrictions: No    Mobility  Bed Mobility Overal bed mobility: Needs Assistance Bed Mobility: Supine to Sit     Supine to sit: Min assist     General bed mobility comments: Pt able to bring his legs off the bed and scoot to the edge just needed a hand to elevate his trunk.   Transfers Overall transfer level: Needs assistance Equipment used: Ambulation equipment used [place] Dec(Sara Plus) Transfers: Sit to/from Stand Sit to Stand: Mod assist;From elevated surface (Sara Plus)         General transfer comment: Pt able to stand with elbow supported and mechanical assistance from sara plus  Ambulation/Gait Ambulation/Gait assistance: Mod assist;+2 safety/equipment Ambulation  Distance (Feet): 35 Feet (10+25) Assistive device:  (Sara Plus) Gait Pattern/deviations: Step-to pattern;Decreased stride length;Shuffle;Trunk flexed;Narrow base of support Gait velocity: extremely slow Gait velocity interpretation: Below normal speed for age/gender General Gait Details: Pt unable to maintain full upright posture but states this is how he walked PTA. Pt takes very small shuffle steps and needs VC's to take bigger steps and to stand more upright.    Stairs            Wheelchair Mobility    Modified Rankin (Stroke Patients Only)       Balance Overall balance assessment: Needs assistance Sitting-balance support: Bilateral upper extremity supported;Feet supported Sitting balance-Leahy Scale: Poor Sitting balance - Comments: Pt able to sit upright for very short bouts then starts to lean backwards and is unable to get himself back upright without Min-ModA.  Postural control: Posterior lean Standing balance support: Bilateral upper extremity supported Standing balance-Leahy Scale: Poor Standing balance comment: reliant on UE support.                     Cognition Arousal/Alertness: Awake/alert Behavior During Therapy: WFL for tasks assessed/performed Overall Cognitive Status: Within Functional Limits for tasks assessed                      Exercises      General Comments General comments (skin integrity, edema, etc.): Pt pleasant and cooperative with therapy. Pt fatigues very quickly.       Pertinent Vitals/Pain Pain Assessment: No/denies pain  Pre-Activity: HR: 70 bpm, O2 Sat: 92% on room air, RR: 18 bpm, BP: 161/78 mmHg Post-Activity:  HR: 62bpm, O2 Sat: 98% on room air, RR: 22 bpm     Home Living                      Prior Function            PT Goals (current goals can now be found in the care plan section) Acute Rehab PT Goals Patient Stated Goal: to get better PT Goal Formulation: With patient Time For Goal  Achievement: 04/20/15 Potential to Achieve Goals: Fair Progress towards PT goals: Progressing toward goals    Frequency  Min 2X/week    PT Plan Current plan remains appropriate    Co-evaluation             End of Session Equipment Utilized During Treatment: Gait belt Activity Tolerance: Patient tolerated treatment well Patient left: in chair;with call bell/phone within reach;with chair alarm set;with family/visitor present     Time: 5409-8119 PT Time Calculation (min) (ACUTE ONLY): 25 min  Charges:  $Gait Training: 8-22 mins $Therapeutic Activity: 8-22 mins                    [name] Codes:      Everlean Cherry, SPT Everlean Cherry 04/16/2015, 11:40 AM

## 2015-04-16 NOTE — Progress Notes (Signed)
Assessment/Plan: 1. HCAP: improved  2. ESRD -MWF (Mon,Wed,Fri, 4 hrs 0 min, 180NRe Optiflux, BFR 450, DFR Autoflow 1.5, EDW 104 (kg), Dialysate 3 K, 2.25) HD today.  Plans are to go home Saturday. 3. HTN/volume -.  4. H/O Afib/flutter: Coumadin per pharmacy  Subjective: Interval History: Plans to go home  Objective: Vital signs in last 24 hours: Temp:  [97.9 F (36.6 C)-98.5 F (36.9 C)] 98.5 F (36.9 C) (03/17 0750) Pulse Rate:  [59-91] 59 (03/17 0750) Resp:  [17-30] 30 (03/17 0750) BP: (139-169)/(71-89) 161/78 mmHg (03/17 0933) SpO2:  [89 %-100 %] 100 % (03/17 0849) Weight change:   Intake/Output from previous day: 03/16 0701 - 03/17 0700 In: 6008 [P.O.:840; I.V.:5168] Out: 0  Intake/Output this shift: Total I/O In: 2614.6 [P.O.:460; I.V.:2154.6] Out: -   General appearance: alert and cooperative Resp: clear to auscultation bilaterally Chest wall: no tenderness Cardio: regular rate and rhythm, S1, S2 normal, no murmur, click, rub or gallop Extremities: edema 1+  Lab Results:  Recent Labs  04/14/15 1028 04/16/15 0822  WBC 8.2 8.1  HGB 8.6* 8.1*  HCT 25.5* 24.9*  PLT 260 284   BMET:  Recent Labs  04/14/15 1028 04/16/15 0822  NA 135 131*  K 4.0 4.4  CL 94* 92*  CO2 27 24  GLUCOSE 100* 142*  BUN 51* 42*  CREATININE 11.14* 9.22*  CALCIUM 9.5 9.6   No results for input(s): PTH in the last 72 hours. Iron Studies: No results for input(s): IRON, TIBC, TRANSFERRIN, FERRITIN in the last 72 hours. Studies/Results: No results found.  Scheduled: . albuterol  2.5 mg Nebulization TID  . [START ON 04/23/2015] amiodarone  200 mg Oral Daily  . amiodarone  400 mg Oral Daily  . amLODipine  10 mg Oral Daily  . aspirin EC  81 mg Oral Daily  . atropine  1 drop Left Eye q12n4p  . calcium acetate  1,334 mg Oral TID WC   And  . calcium acetate  667 mg Oral With snacks  . ceFEPime (MAXIPIME) IV  2 g Intravenous Q M,W,F-2000  . cinacalcet  60 mg Oral QHS  .  docusate sodium  100 mg Oral BID  . dorzolamide-timolol  1 drop Right Eye BID  . doxercalciferol  5 mcg Intravenous Q M,W,F-HD  . insulin aspart  0-8 Units Subcutaneous TID WC  . irbesartan  150 mg Oral Daily  . latanoprost  1 drop Left Eye QHS  . multivitamin with minerals  1 tablet Oral Daily  . prednisoLONE acetate  1 drop Left Eye BID  . simvastatin  20 mg Oral QPM  . sodium chloride flush  3 mL Intravenous Q12H  . tobramycin-dexamethasone  1 drop Left Eye QID  . Warfarin - Pharmacist Dosing Inpatient   Does not apply q1800     LOS: 3 days   Breindy Meadow C 04/16/2015,11:51 AM

## 2015-04-16 NOTE — Progress Notes (Signed)
Patient confused/agitated. Refusing AM labs and nursing interventions, such as temperature. Wife at bedside and nurse frequently reorienting patient. Pt to go to HD this AM. Will report to HD RN to obtain labs during dialysis. Bed alarm on.  M.Allysson Rinehimer, RN

## 2015-04-16 NOTE — Progress Notes (Signed)
Patient Name: [name] [name] Date of Encounter: 04/16/2015  Principal Problem:   Acute respiratory failure (HCC) Active Problems:   Hypertension   Diabetes mellitus with ESRD (end-stage renal disease) (HCC)   ESRD (end stage renal disease) (HCC)   Sinus bradycardia-tachycardia syndrome (HCC)   HCAP (healthcare-associated pneumonia)   Elevated troponin   Anemia   Atrial flutter with rapid ventricular response [facility])   Primary Cardiologist: Dr. Katrinka Blazing  Patient Profile: Mr. [name] [name] is a 82 year old male with past medical history of ESRD with HD on M/W/F, DM, Afib on coumadin, and HTN. He presented to [facility] ED on 04/12/15 with weakness and inability to walk. Chest X ray shows RUL PNA. He went into Aflutter with rate of 130's on 04/14/15 and was started on Amio gtt.   SUBJECTIVE: Feels better. No dyspnea.  OBJECTIVE Filed Vitals:   04/16/15 0600 04/16/15 0750 04/16/15 0849 04/16/15 0933  BP: 161/77 153/79  161/78  Pulse: 64 59    Temp:  98.5 F (36.9 C)    TempSrc:  Oral    Resp: 21 30    Height:      Weight:      SpO2: 94% 94% 100%     Intake/Output Summary (Last 24 hours) at 04/16/15 1020 Last data filed at 04/16/15 0700  Gross per 24 hour  Intake   5768 ml  Output      0 ml  Net   5768 ml   Filed Weights   04/13/15 1943 04/14/15 1150 04/14/15 1600  Weight: 225 lb 5 oz (102.2 kg) 230 lb 13.2 oz (104.7 kg) 232 lb 2.3 oz (105.3 kg)    PHYSICAL EXAM General: Well developed, well nourished, male in no acute distress. Head: Normocephalic, atraumatic.  Neck: Supple without bruits, JVD. Lungs:  Resp regular and unlabored, CTA. Heart: RRR, S1, S2, no S3, S4, or murmur; no rub. Abdomen: Soft, non-tender, non-distended, BS + x 4.  Extremities: No clubbing, cyanosis, edema. AV fistula L arm Neuro: Alert and oriented X 3. Moves all extremities spontaneously. Psych: Normal affect.  LABS: CBC: Recent Labs  04/14/15 1028 04/16/15 0822  WBC 8.2 8.1  HGB 8.6* 8.1*  HCT  25.5* 24.9*  MCV 93.1 91.5  PLT 260 284   INR: Recent Labs  04/16/15 0822  INR 2.01*   Basic Metabolic Panel: Recent Labs  04/14/15 1028 04/16/15 0822  NA 135 131*  K 4.0 4.4  CL 94* 92*  CO2 27 24  GLUCOSE 100* 142*  BUN 51* 42*  CREATININE 11.14* 9.22*  CALCIUM 9.5 9.6  PHOS 5.6*  --    Liver Function Tests: Recent Labs  04/14/15 1028  ALBUMIN 2.2*    TELE:   NSR - converted around 4:00 am  . Occasional PACs   Current Medications:  . albuterol  2.5 mg Nebulization TID  . amLODipine  10 mg Oral Daily  . aspirin EC  81 mg Oral Daily  . atropine  1 drop Left Eye q12n4p  . bisoprolol  10 mg Oral Daily  . calcium acetate  1,334 mg Oral TID WC   And  . calcium acetate  667 mg Oral With snacks  . ceFEPime (MAXIPIME) IV  2 g Intravenous Q M,W,F-2000  . cinacalcet  60 mg Oral QHS  . docusate sodium  100 mg Oral BID  . dorzolamide-timolol  1 drop Right Eye BID  . doxercalciferol  5 mcg Intravenous Q M,W,F-HD  . insulin aspart  0-8 Units Subcutaneous TID WC  . irbesartan  150 mg Oral Daily  . latanoprost  1 drop Left Eye QHS  . multivitamin with minerals  1 tablet Oral Daily  . prednisoLONE acetate  1 drop Left Eye BID  . simvastatin  20 mg Oral QPM  . sodium chloride flush  3 mL Intravenous Q12H  . tobramycin-dexamethasone  1 drop Left Eye QID  . Warfarin - Pharmacist Dosing Inpatient   Does not apply q1800   . amiodarone 30 mg/hr (04/15/15 2210)    ASSESSMENT AND PLAN: Principal Problem:   Acute respiratory failure (HCC) Active Problems:   Hypertension   Diabetes mellitus with ESRD (end-stage renal disease) (HCC)   ESRD (end stage renal disease) (HCC)   Sinus bradycardia-tachycardia syndrome (HCC)   HCAP (healthcare-associated pneumonia)   Elevated troponin   Anemia   Atrial flutter with rapid ventricular response (HCC)   1. Aflutter - Converted to NSR this am. - will transition to PO Amio, he was on this at home.   2. RLL PNA - Mgmt per  IM. - On antibiotics - Clinically improved.   Signed, Little IshikawaErin E Smith , NP   10:20 AM 04/16/2015  I have seen and examined the patient along with Little IshikawaErin E Smith , NP.  I have reviewed the chart, notes and new data.  I agree with NP's note.  Key new complaints: better, no dyspnea Key examination changes: fewer wheezes Key new findings / data: in NSR with PACs for last 6 hours  PLAN: Amiodarone PO 400 mg for a week, then back to maintenance dose of 200 mg daily. Stop beta blocker. Continue anticoagulation.  Thurmon FairMihai Channelle Bottger, MD, [facility] [phone] 04/16/2015, 10:33 AM

## 2015-04-16 NOTE — Care Management Note (Signed)
Case Management Note  Patient Details  Name: [name] [name] MRN: [id number] Date of Birth: 1933-07-28  Subjective/Objective:   PT recommends SNF for rehab but pt and spouse refuse.  They are now agreeable to home health PT.  Provided spouse with list of home health agencies, referral [facility] per choice.                      Expected Discharge Plan:  Home w Home Health Services  Discharge planning Services  CM Consult  Post Acute Care Choice:  Home Health Choice offered to:  Spouse  HH Arranged:  [facility]  Status of Service:  Completed, signed off  Medicare Important Message Given:  Yes  Magdalene RiverMayo, Nyshaun Standage T, RN 04/16/2015, 2:53 PM

## 2015-04-16 NOTE — Care Management Important Message (Signed)
Important Message  Patient Details  Name: Orlene ErmRichard G Sapp MRN: [id number] Date of Birth: 11-15-1933   Medicare Important Message Given:  Yes    Santonio Speakman P Mercede Rollo 04/16/2015, 2:17 PM

## 2015-04-16 NOTE — Progress Notes (Signed)
PROGRESS NOTE  [name] [name] ZOX:[id number] DOB: 1933/06/02 DOA: 04/12/2015 PCP: Katy Apo, MD  [name] [name] is a 82 y.o. male with past medical history that includes diabetes, A. Fib on coumadin, hypertension, end-stage renal disease on Monday Wednesday Friday dialysis patient presents to the emergency department with chief complaint of generalized weakness and inability to ambulate. Initial evaluation in the emergency department reveals pneumonia.  He recently was discharged from the hospital after a brief stay for A. fib with RVR. He was admitted for evalution of health care associated pneumonia and afib with RVR,. He was started on amio gtt by cardiology and  Plan to transition to po amiodarone today.  Meanwhile he had an episode of hypoglycemia and his oral anti diabetic medications were held initially discontinued. PT saw and recommended SNF but patient and family refused.  They also refused home health.  Plan to discharge the patient in am to home with home health if the family wishes to agree to home health.   Assessment/Plan: Acute respiratory failure with hypoxia likely secondary to healthcare associated pneumonia .  No chest pain. - completed 4 days of vancomycin and day 5 of cefepime today. Plan to discontinue all antibiotics on discharge.  -influenza panel negative - sputum Culture normal oropharyngeal flora -Oxygen supplementation- weaned off. -Nebulizers every 4 hours -home O2 as needed.    Healthcare associated pneumonia.  -Antibiotics as noted above -Blood cultures pending and negative till date.  -speech therapy to evaluate for silent aspiration, recommended no follow up needed at this time.  -physical therapy recommended SNF, but pt refused.  -Followup PA and lateral chest radiographs recommended in 3-4 weeks following trial of antibiotic therapy to ensure resolution and exclude underlying malignancy.  Hypoglycemia -holding home meds- glucotrol  - CBG (last  3)   Recent Labs  04/15/15 2209 04/16/15 0127 04/16/15 0748  GLUCAP 152* 133* 142*    cbg's improving.    Elevated troponin. Likely related to #1 the setting of end-stage renal disease. Denies chest pain. Troponin 0.22. KG without acute changes -no chest pain   End-stage renal disease. He is a Monday Wednesday Friday dialysis patient. Creatinine 13.6 on admission -Nephrology consulted-and plan for HD today.   Diabetes. On oral agents at home. Serum glucose 135 on admission but dropped once admitted -hemoglobin A1c: 6.3 CBG (last 3)   Recent Labs  04/15/15 2209 04/16/15 0127 04/16/15 0748  GLUCAP 152* 133* 142*       History of A. Fib. Chadvasc score 5. EKG shows normal sinus rhythm. Home medications include Coumadin and amiodarone. creatinine 4.26 on admission - cardiology consulted and recommended IV amiodarone, added low dose bb 3/16. Plan to transition to oral amiodarone  Today with  for a week and a maintenance dose of 200 mg daily .  Bb was discontinued today.  Resume coumadin and INR is therapeutic.   Hypertension. Controlled. -Continue home meds  Anemia. Likely related to anemia of chronic disease from ESRD.  Normocyic.    Code Status: partial Family Communication: wife at bedside, updated patient and family.  Disposition Plan: home tomrrow.    Consultants:  Renal  Cardiology  PT  Procedures:  dialysis    HPI/Subjective: Pt denies any chest pain sob. Was slightly confused this am, as per wife at bedside, this is normal for the patient.   Objective: Filed Vitals:   04/16/15 0750 04/16/15 0933  BP: 153/79 161/78  Pulse: 59   Temp: 98.5 F (36.9 C)  Resp: 30     Intake/Output Summary (Last 24 hours) at 04/16/15 1139 Last data filed at 04/16/15 1100  Gross per 24 hour  Intake 6182.17 ml  Output      0 ml  Net 6182.17 ml   Filed Weights   04/13/15 1943 04/14/15 1150 04/14/15 1600  Weight: 102.2 kg (225 lb 5 oz) 104.7 kg (230  lb 13.2 oz) 105.3 kg (232 lb 2.3 oz)    Exam:   General:  Awake, NAD- on 3L O2  Cardiovascular: s1s2. RRR  Respiratory: rales, no wheezing  Abdomen: +BS, soft  Musculoskeletal: + edema   Data Reviewed: Basic Metabolic Panel:  Recent Labs Lab 04/12/15 1001 04/13/15 0529 04/14/15 0523 04/14/15 1028 04/16/15 0822  NA 141 138 136 135 131*  K 4.4 3.5 4.0 4.0 4.4  CL 96* 96* 94* 94* 92*  CO2 26 28 27 27 24   GLUCOSE 135* 97 118* 100* 142*  BUN 58* 31* 48* 51* 42*  CREATININE 13.16* 8.54* 10.63* 11.14* 9.22*  CALCIUM 10.1 9.3 9.4 9.5 9.6  PHOS  --   --   --  5.6*  --    Liver Function Tests:  Recent Labs Lab 04/13/15 0529 04/14/15 1028  AST 34  --   ALT 30  --   ALKPHOS 46  --   BILITOT 0.6  --   PROT 6.9  --   ALBUMIN 2.4* 2.2*   No results for input(s): LIPASE, AMYLASE in the last 168 hours. No results for input(s): AMMONIA in the last 168 hours. CBC:  Recent Labs Lab 04/12/15 1001 04/13/15 0529 04/14/15 0523 04/14/15 1028 04/16/15 0822  WBC 12.1* 8.6 7.4 8.2 8.1  NEUTROABS 10.4*  --   --   --   --   HGB 9.6* 8.2* 8.2* 8.6* 8.1*  HCT 29.9* 26.2* 24.4* 25.5* 24.9*  MCV 93.7 94.2 92.8 93.1 91.5  PLT 270 261 237 260 284   Cardiac Enzymes:  Recent Labs Lab 04/12/15 1901 04/13/15 0529  TROPONINI 0.11* 0.10*   BNP (last 3 results) No results for input(s): BNP in the last 8760 hours.  ProBNP (last 3 results) No results for input(s): PROBNP in the last 8760 hours.  CBG:  Recent Labs Lab 04/15/15 1258 04/15/15 1627 04/15/15 2209 04/16/15 0127 04/16/15 0748  GLUCAP 159* 186* 152* 133* 142*    Recent Results (from the past 240 hour(s))  MRSA PCR Screening     Status: None   Collection Time: 04/07/15 10:29 PM  Result Value Ref Range Status   MRSA by PCR NEGATIVE NEGATIVE Final    Comment:        The GeneXpert MRSA Assay (FDA approved for NASAL specimens only), is one component of a comprehensive MRSA colonization surveillance  program. It is not intended to diagnose MRSA infection nor to guide or monitor treatment for MRSA infections.   Culture, blood (routine x 2) Call MD if unable to obtain prior to antibiotics being given     Status: None (Preliminary result)   Collection Time: 04/12/15 12:12 PM  Result Value Ref Range Status   Specimen Description BLOOD RIGHT HAND  Final   Special Requests IN PEDIATRIC BOTTLE 1CC  Final   Culture NO GROWTH 3 DAYS  Final   Report Status PENDING  Incomplete  Culture, blood (routine x 2) Call MD if unable to obtain prior to antibiotics being given     Status: None (Preliminary result)   Collection Time: 04/12/15  1:14 PM  Result Value Ref Range Status   Specimen Description BLOOD RIGHT HAND  Final   Special Requests IN PEDIATRIC BOTTLE 2CC  Final   Culture NO GROWTH 3 DAYS  Final   Report Status PENDING  Incomplete  Gram stain     Status: None   Collection Time: 04/12/15  1:55 PM  Result Value Ref Range Status   Specimen Description TRACHEAL SITE  Final   Special Requests NONE  Final   Gram Stain   Final    FEW WBC PRESENT,BOTH PMN AND MONONUCLEAR MODERATE GRAM POSITIVE RODS FEW GRAM POSITIVE COCCI IN PAIRS FEW GRAM NEGATIVE RODS SQUAMOUS EPITHELIAL CELLS PRESENT    Report Status 04/12/2015 FINAL  Final  Culture, respiratory (NON-Expectorated)     Status: None   Collection Time: 04/12/15  1:55 PM  Result Value Ref Range Status   Specimen Description SPUTUM  Final   Special Requests NONE  Final   Gram Stain   Final    FEW WBC PRESENT,BOTH PMN AND MONONUCLEAR MODERATE SQUAMOUS EPITHELIAL CELLS PRESENT MODERATE GRAM POSITIVE RODS FEW GRAM POSITIVE COCCI IN PAIRS FEW GRAM NEGATIVE RODS [facility]formed at [facility]    Culture   Final    NORMAL OROPHARYNGEAL FLORA Performed at [facility]    Report Status 04/15/2015 FINAL  Final  MRSA PCR Screening     Status: None   Collection Time: 04/14/15  7:10 PM  Result  Value Ref Range Status   MRSA by PCR NEGATIVE NEGATIVE Final    Comment:        The GeneXpert MRSA Assay (FDA approved for NASAL specimens only), is one component of a comprehensive MRSA colonization surveillance program. It is not intended to diagnose MRSA infection nor to guide or monitor treatment for MRSA infections.      Studies: No results found.  Scheduled Meds: . albuterol  2.5 mg Nebulization TID  . [START ON 04/23/2015] amiodarone  200 mg Oral Daily  . amiodarone  400 mg Oral Daily  . amLODipine  10 mg Oral Daily  . aspirin EC  81 mg Oral Daily  . atropine  1 drop Left Eye q12n4p  . calcium acetate  1,334 mg Oral TID WC   And  . calcium acetate  667 mg Oral With snacks  . ceFEPime (MAXIPIME) IV  2 g Intravenous Q M,W,F-2000  . cinacalcet  60 mg Oral QHS  . docusate sodium  100 mg Oral BID  . dorzolamide-timolol  1 drop Right Eye BID  . doxercalciferol  5 mcg Intravenous Q M,W,F-HD  . insulin aspart  0-8 Units Subcutaneous TID WC  . irbesartan  150 mg Oral Daily  . latanoprost  1 drop Left Eye QHS  . multivitamin with minerals  1 tablet Oral Daily  . prednisoLONE acetate  1 drop Left Eye BID  . simvastatin  20 mg Oral QPM  . sodium chloride flush  3 mL Intravenous Q12H  . tobramycin-dexamethasone  1 drop Left Eye QID  . Warfarin - Pharmacist Dosing Inpatient   Does not apply q1800   Continuous Infusions:   Antibiotics Given (last 72 hours)    Date/Time Action Medication Dose Rate   04/14/15 1502 Given   vancomycin (VANCOCIN) IVPB 1000 mg/200 mL premix 1,000 mg 200 mL/hr   04/14/15 2137 Given   ceFEPIme (MAXIPIME) 2 g in dextrose 5 % 50 mL IVPB 2 g 100 mL/hr      Principal Problem:  Acute respiratory failure (HCC) Active Problems:   Hypertension   Diabetes mellitus with ESRD (end-stage renal disease) (HCC)   ESRD (end stage renal disease) (HCC)   Sinus bradycardia-tachycardia syndrome (HCC)   HCAP (healthcare-associated pneumonia)   Elevated  troponin   Anemia   Atrial flutter with rapid ventricular response (HCC)    Time spent: 25 min    Clayborne Divis  [facility] Pager [phone] If 7PM-7AM, please contact night-coverage at [email or web address], password [facility] 04/16/2015, 11:39 AM  LOS: 3 days

## 2015-04-16 NOTE — Procedures (Signed)
Tolerating hemodialysis, no hemodynamic issue at this time. [name] [name]

## 2015-04-17 LAB — CULTURE, BLOOD (ROUTINE X 2)
CULTURE: NO GROWTH
Culture: NO GROWTH

## 2015-04-17 LAB — MAGNESIUM: MAGNESIUM: 2.1 mg/dL (ref 1.7–2.4)

## 2015-04-17 LAB — PROTIME-INR
INR: 2.18 — ABNORMAL HIGH (ref 0.00–1.49)
Prothrombin Time: 24.1 seconds — ABNORMAL HIGH (ref 11.6–15.2)

## 2015-04-17 LAB — GLUCOSE, CAPILLARY
Glucose-Capillary: 117 mg/dL — ABNORMAL HIGH (ref 65–99)
Glucose-Capillary: 146 mg/dL — ABNORMAL HIGH (ref 65–99)

## 2015-04-17 MED ORDER — AMIODARONE HCL 400 MG PO TABS: 400.0000 mg | ORAL_TABLET | Freq: Every day | ORAL | Status: DC

## 2015-04-17 MED ORDER — AMIODARONE HCL 200 MG PO TABS: 200.0000 mg | ORAL_TABLET | Freq: Every day | ORAL | Status: DC

## 2015-04-17 MED ORDER — AMOXICILLIN-POT CLAVULANATE 875-125 MG PO TABS: 1.0000 | ORAL_TABLET | Freq: Two times a day (BID) | ORAL | Status: DC

## 2015-04-17 MED ORDER — ADULT MULTIVITAMIN W/MINERALS CH: 1.0000 | ORAL_TABLET | Freq: Every day | ORAL | Status: DC

## 2015-04-17 MED ORDER — WARFARIN SODIUM 5 MG PO TABS: ORAL_TABLET | ORAL | Status: DC

## 2015-04-17 MED ORDER — AMOXICILLIN-POT CLAVULANATE 500-125 MG PO TABS: 500.0000 mg | ORAL_TABLET | Freq: Every day | ORAL | Status: AC

## 2015-04-17 NOTE — Progress Notes (Signed)
Pt transported by home by [facility] @ 18:00. Wife notified that pt was on his way and reminded to give hime Coumadin 7.5mg  this PM

## 2015-04-17 NOTE — Progress Notes (Signed)
Assessment/Plan: 1. HCAP: improved  2. ESRD -MWF (Mon,Wed,Fri, 4 hrs 0 min, 180NRe Optiflux, BFR 450, DFR Autoflow 1.5, EDW 104 (kg), Dialysate 3 K, 2.25) HD yesterday. Marked reduction in weight.  Will recheck! 3. HTN/volume -.  4. H/O Afib/flutter: Coumadin per pharmacy  Subjective: Interval History: Wife appears nervous about going home today  Objective: Vital signs in last 24 hours: Temp:  [96.3 F (35.7 C)-98.4 F (36.9 C)] 98.4 F (36.9 C) (03/18 0802) Pulse Rate:  [55-64] 61 (03/18 0400) Resp:  [13-26] 23 (03/18 0802) BP: (131-169)/(63-99) 155/89 mmHg (03/18 0802) SpO2:  [88 %-100 %] 97 % (03/18 0802) Weight:  [87.5 kg (192 lb 14.4 oz)-91.1 kg (200 lb 13.4 oz)] 87.5 kg (192 lb 14.4 oz) (03/17 1818) Weight change:   Intake/Output from previous day: 03/17 0701 - 03/18 0700 In: 3024.6 [P.O.:820; I.V.:2154.6; IV Piggyback:50] Out: 3500  Intake/Output this shift: Total I/O In: 120 [P.O.:120] Out: -   General appearance: alert and cooperative Resp: clear to auscultation bilaterally Chest wall: no tenderness Cardio: regular rate and rhythm, S1, S2 normal, no murmur, click, rub or gallop Extremities: extremities normal, atraumatic, no cyanosis or edema  LUE AVF  Lab Results:  Recent Labs  04/16/15 0822  WBC 8.1  HGB 8.1*  HCT 24.9*  PLT 284   BMET:  Recent Labs  04/16/15 0822  NA 131*  K 4.4  CL 92*  CO2 24  GLUCOSE 142*  BUN 42*  CREATININE 9.22*  CALCIUM 9.6   No results for input(s): PTH in the last 72 hours. Iron Studies: No results for input(s): IRON, TIBC, TRANSFERRIN, FERRITIN in the last 72 hours. Studies/Results: No results found.  Scheduled: . [START ON 04/23/2015] amiodarone  200 mg Oral Daily  . amiodarone  400 mg Oral Daily  . amLODipine  10 mg Oral Daily  . aspirin EC  81 mg Oral Daily  . atropine  1 drop Left Eye q12n4p  . calcium acetate  1,334 mg Oral TID WC   And  . calcium acetate  667 mg Oral With snacks  . ceFEPime  (MAXIPIME) IV  2 g Intravenous Q M,W,F-2000  . cinacalcet  60 mg Oral QHS  . docusate sodium  100 mg Oral BID  . dorzolamide-timolol  1 drop Right Eye BID  . doxercalciferol  5 mcg Intravenous Q M,W,F-HD  . insulin aspart  0-8 Units Subcutaneous TID WC  . irbesartan  150 mg Oral Daily  . latanoprost  1 drop Left Eye QHS  . multivitamin with minerals  1 tablet Oral Daily  . prednisoLONE acetate  1 drop Left Eye BID  . simvastatin  20 mg Oral QPM  . sodium chloride flush  3 mL Intravenous Q12H  . tobramycin-dexamethasone  1 drop Left Eye QID  . Warfarin - Pharmacist Dosing Inpatient   Does not apply q1800    LOS: 4 days   [name] [name] C 04/17/2015,10:49 AM

## 2015-04-17 NOTE — Progress Notes (Signed)
ANTICOAGULATION CONSULT NOTE - Follow Up Consult  Pharmacy Consult for Warfarin Indication: atrial fibrillation  Allergies  Allergen Reactions  . Diltiazem Other (See Comments)    SEVERE STOMACH CRAMPING PER PT    Patient Measurements: Height: 6\' 4"  (193 cm) Weight: 192 lb 14.4 oz (87.5 kg) IBW/kg (Calculated) : 86.8  Vital Signs: Temp: 98.4 F (36.9 C) (03/18 0802) Temp Source: Oral (03/18 0802) BP: 155/89 mmHg (03/18 0802) Pulse Rate: 61 (03/18 0400)  Labs:  Recent Labs  04/14/15 1028 04/15/15 0350 04/16/15 0822 04/17/15 0421  HGB 8.6*  --  8.1*  --   HCT 25.5*  --  24.9*  --   PLT 260  --  284  --   LABPROT  --  29.3* 22.7* 24.1*  INR  --  2.82* 2.01* 2.18*  CREATININE 11.14*  --  9.22*  --     Estimated Creatinine Clearance: 8 mL/min (by C-G formula based on Cr of 9.22).   Assessment: [age] YOM with ESRD on warfarin PTA for hx Afib. The patient was admitted with a SUPRAtherapeutic INR on the PTA dose. Pharmacy was consulted to manage warfarin dosing this admission.  INR 2.01>2.18. CBC stable - no overt s/sx of bleeding noted.   PTA dose noted to be 7.5mg  daily EXCEPT for 10 mg on Thursdays only. The patient was re-educated on warfarin today.  Goal of Therapy:  INR 2-3   Plan:  Warfarin 5mg  tonight x1 Daily INR Monitor s/sx of bleeding If to d/c home, recommend lowering home dose to 7.5mg  daily except 5mg  MWF (this is a 20% reduction in prior home dose) with follow-up INR next week.   Arlean Hoppingorey M. Newman PiesBall, PharmD, BCPS Clinical Pharmacist Pager [phone]  04/17/2015 9:33 AM

## 2015-04-17 NOTE — Discharge Summary (Signed)
Physician Discharge Summary  [name] [name] ZOX:[id number] DOB: 1933-02-06 DOA: 04/12/2015  PCP: Katy ApoPOLITE,RONALD D, MD  Admit date: 04/12/2015 Discharge date: 04/17/2015  Time spent: 35 minutes  Recommendations for Outpatient Follow-up:  Followup PA and lateral chest radiographs recommended in 3-4 weeks following trial of antibiotic therapy to ensure resolution and exclude underlying malignancy.  Discharge Diagnoses:    HCAP (healthcare-associated pneumonia)   Acute respiratory failure (HCC)   Atrial flutter with rapid ventricular response (HCC)   Hypertension   Diabetes mellitus with ESRD (end-stage renal disease) (HCC)   ESRD (end stage renal disease) (HCC)   Sinus bradycardia-tachycardia syndrome (HCC)   Elevated troponin   Anemia     Discharge Condition: stable  Diet recommendation: renal diet   Filed Weights   04/14/15 1600 04/16/15 1406 04/16/15 1818  Weight: 105.3 kg (232 lb 2.3 oz) 91.1 kg (200 lb 13.4 oz) 87.5 kg (192 lb 14.4 oz)    History of present illness:  [name] [name] is a 82 y.o. male with past medical history that includes diabetes, A. Fib on coumadin, hypertension, end-stage renal disease on Monday Wednesday Friday dialysis patient presents to the emergency department with chief complaint of generalized weakness and inability to ambulate. Initial evaluation in the emergency department reveals pneumonia. He recently was discharged from the hospital after a brief stay for A. fib with RVR. He was admitted for evalution of health care associated pneumonia and afib with RVR,. He was started on amio gtt by cardiology and Plan to transition to po amiodarone today.  Meanwhile he had an episode of hypoglycemia and his oral anti diabetic medications were held initially discontinued. PT saw and recommended SNF but patient and family refused. They also refused home health. Plan to discharge the patient in am to home with home health if the family wishes to agree to home  health.   Hospital Course:  Acute respiratory failure with hypoxia likely secondary to healthcare associated pneumonia . No chest pain. - completed 4 days of vancomycin and day 5 of cefepime. Plan to discharge on 2 days of Augmentin for total of 7 days Tx  -influenza panel negative - sputum Culture normal oropharyngeal flora -Oxygen supplementation- weaned off. -Nebulizers every 4 hours -home O2 as needed.   Healthcare associated pneumonia.  -Antibiotics as noted above -Blood cultures pending and negative till date.  -speech therapy to evaluate for silent aspiration, recommended no follow up needed at this time.  -physical therapy recommended SNF, but pt refused.  -Followup PA and lateral chest radiographs recommended in 3-4 weeks following trial of antibiotic therapy to ensure resolution and exclude underlying malignancy. -discharge on 2 more days of Augmentin to complete 7 days Tx.   Hypoglycemia -holding home meds- glucotrol at discharge.   Elevated troponin. Likely related to #1 the setting of end-stage renal disease. Denies chest pain. Troponin 0.22. KG without acute changes -no chest pain  End-stage renal disease. He is a Monday Wednesday Friday dialysis patient. Creatinine 13.6 on admission -Nephrology consulted-  History of A. Fib. Chadvasc score 5. EKG shows normal sinus rhythm. Home medications include Coumadin and amiodarone. creatinine 4.26 on admission - cardiology consulted and recommended IV amiodarone, added low dose bb 3/16. Plan to transition to oral amiodarone Needs amiodarone  400mg  for a week and a maintenance dose of 200 mg daily . Bb was discontinued today.  -Continue with coumadin   Hypertension. Controlled. -Continue home meds  Anemia. Likely related to anemia of chronic disease from ESRD.  Normocyic.   Procedures:  HD    Consultations:  Cardiology   Discharge Exam: Filed Vitals:   04/17/15 0500 04/17/15 0802  BP: 163/75 155/89   Pulse:    Temp:  98.4 F (36.9 C)  Resp: 21 23    General: NAD Cardiovascular: S 1, S 2 RRR Respiratory: CTA  Discharge Instructions   Discharge Instructions    Diet - low sodium heart healthy    Complete by:  As directed      Increase activity slowly    Complete by:  As directed           Current Discharge Medication List    START taking these medications   Details  Multiple Vitamin (MULTIVITAMIN WITH MINERALS) TABS tablet Take 1 tablet by mouth daily. Qty: 30 tablet, Refills: 0      CONTINUE these medications which have CHANGED   Details  !! amiodarone (PACERONE) 200 MG tablet Take 1 tablet (200 mg total) by mouth daily. Qty: 30 tablet, Refills: 0    !! amiodarone (PACERONE) 400 MG tablet Take 1 tablet (400 mg total) by mouth daily. Qty: 14 tablet, Refills: 0    warfarin (COUMADIN) 5 MG tablet Take 7.5 mg daily, except on M, W, F . On M, W, F take 5 mg by mouth. Qty: 50 tablet, Refills: 3     !! - Potential duplicate medications found. Please discuss with provider.    CONTINUE these medications which have NOT CHANGED   Details  acetaminophen (TYLENOL) 325 MG tablet Take 650 mg by mouth daily as needed for mild pain.    albuterol (PROVENTIL HFA;VENTOLIN HFA) 108 (90 Base) MCG/ACT inhaler Inhale 1-2 puffs into the lungs every 6 (six) hours as needed for wheezing or shortness of breath.     amLODipine (NORVASC) 10 MG tablet Take 10 mg by mouth at bedtime.     aspirin EC 81 MG tablet Take 81 mg by mouth daily.    atropine 1 % ophthalmic solution Place 1 drop into the left eye 2 times daily at 12 noon and 4 pm.    Calcium Acetate 667 MG TABS Take 1-2 capsules by mouth 3 (three) times daily. 667 mg for snacks, 1334 mg for meals    docusate sodium (COLACE) 100 MG capsule Take 100 mg by mouth 2 (two) times daily.    dorzolamide-timolol (COSOPT) 22.3-6.8 MG/ML ophthalmic solution Place 1 drop into the right eye 2 (two) times daily.     folic acid-vitamin b  complex-vitamin c-selenium-zinc (DIALYVITE) 3 MG TABS tablet Take 1 tablet by mouth daily.    lidocaine-prilocaine (EMLA) cream Apply 1 application topically as needed (apply to skin near dialysis port before dialysis).     prednisoLONE acetate (PRED FORTE) 1 % ophthalmic suspension Place 1 drop into the left eye 2 (two) times daily. Patient to use for 37 days. Filled on 03-28-15    SENSIPAR 60 MG tablet Take 60 mg by mouth at bedtime.     simvastatin (ZOCOR) 20 MG tablet Take 20 mg by mouth every evening.    tobramycin-dexamethasone (TOBRADEX) ophthalmic solution Place 1 drop into the left eye 4 (four) times daily. For 37 days. Filled 03-31-15    TRAVATAN Z 0.004 % SOLN ophthalmic solution Place 1 drop into the right eye at bedtime.     valsartan (DIOVAN) 160 MG tablet Take 160 mg by mouth at bedtime.       STOP taking these medications     glipiZIDE (GLUCOTROL) 10  MG tablet        Allergies  Allergen Reactions  . Diltiazem Other (See Comments)    SEVERE STOMACH CRAMPING PER PT   Follow-up Information    Please follow up.   Why:  you need INR check on monday       Follow up with POLITE,RONALD D, MD In 3 days.   Specialty:  Internal Medicine   Contact information:   [address] [place] [address] [phone]        The results of significant diagnostics from this hospitalization (including imaging, microbiology, ancillary and laboratory) are listed below for reference.    Significant Diagnostic Studies: Dg Chest 1 View  04/13/2015  CLINICAL DATA:  I50.9 (ICD-10-CM) - CHF (congestive heart failure) (HCC) J18.9 (ICD-10-CM) - PNA (pneumonia) cough EXAM: CHEST  1 VIEW COMPARISON:  the previous day's study FINDINGS: Asymmetric interstitial and alveolar opacities most prominent in the right upper lobe as before. Heart size upper limits normal for technique. No definite effusion. No pneumothorax. Visualized skeletal structures are unremarkable. IMPRESSION: 1.  Little change in asymmetric infiltrates or edema. Electronically Signed   By: Corlis Leak M.D.   On: 04/13/2015 13:05   Dg Chest 2 View  04/12/2015  CLINICAL DATA:  Shortness of breath and tachycardia EXAM: CHEST  2 VIEW COMPARISON:  July 10, 2014 FINDINGS: There is patchy airspace consolidation throughout much of the right lung, primarily in the right upper lobe anteriorly. Left lung is clear. Heart is upper normal in size with pulmonary vascularity within normal limits. No adenopathy. There are no apparent bone lesions. IMPRESSION: Extensive infiltrate on the right, most notably in the anterior segment right upper lobe. Left lung clear. No adenopathy evident. Followup PA and lateral chest radiographs recommended in 3-4 weeks following trial of antibiotic therapy to ensure resolution and exclude underlying malignancy. Electronically Signed   By: Bretta Bang III M.D.   On: 04/12/2015 09:55   Ct Head Wo Contrast  04/04/2015  CLINICAL DATA:  Larey Seat last week. Fell from bed with trauma to the head and neck. Occipital region pain. Symptoms worsening. EXAM: CT HEAD WITHOUT CONTRAST CT CERVICAL SPINE WITHOUT CONTRAST TECHNIQUE: Multidetector CT imaging of the head and cervical spine was performed following the standard protocol without intravenous contrast. Multiplanar CT image reconstructions of the cervical spine were also generated. COMPARISON:  04/01/2015 FINDINGS: CT HEAD FINDINGS The brain shows atrophy with chronic small-vessel ischemic changes throughout the white matter, thalami I and pons. No sign of acute infarction, mass lesion, hemorrhage, hydrocephalus or extra-axial collection. No skull fracture. No fluid in the sinuses, middle ears or mastoids. Chronic calcification of the left globe but as previously seen. CT CERVICAL SPINE FINDINGS No evidence of fracture or traumatic malalignment. There chronic degenerative changes at the C1-2 articulation but without encroachment upon the neural spaces. C2-3:   Chronic facet fusion on the left.  No stenosis. C3-4: Facet degeneration on the left. Mild foraminal narrowing. No significant stenosis. C4-5: Spondylosis more prominent towards the right. No significant stenosis. C5-6:  Spondylosis.  No significant stenosis. C6-7: Spondylosis. Facet degeneration on the left. No significant stenosis. C7-T1:  Mild facet degeneration.  No stenosis. IMPRESSION: Head CT: No acute or traumatic finding. Atrophy and chronic small vessel ischemic changes. Cervical spine CT: No acute or traumatic finding. Extensive chronic degenerative changes. Electronically Signed   By: Paulina Fusi M.D.   On: 04/04/2015 10:10   Ct Head Wo Contrast  04/01/2015  CLINICAL DATA:  Posttraumatic headache after falling out of bed 2 days ago. No reported loss of consciousness. EXAM: CT HEAD WITHOUT CONTRAST CT CERVICAL SPINE WITHOUT CONTRAST TECHNIQUE: Multidetector CT imaging of the head and cervical spine was performed following the standard protocol without intravenous contrast. Multiplanar CT image reconstructions of the cervical spine were also generated. COMPARISON:  CT scan of head of July 10, 2014. FINDINGS: CT HEAD FINDINGS Bony calvarium appears intact. Mild diffuse cortical atrophy is noted. Mild chronic ischemic white matter disease is noted. No mass effect or midline shift is noted. Ventricular size is within normal limits. There is no evidence of mass lesion, hemorrhage or acute infarction. CT CERVICAL SPINE FINDINGS Reversal of normal lordosis is noted due to degenerative disc disease. Severe degenerative disc disease is noted at C4-5, C5-6 and C6-7 with anterior osteophyte formation. No fracture or spondylolisthesis is noted. Visualized upper lung zones appear normal. IMPRESSION: Mild diffuse cortical atrophy. Mild chronic ischemic white matter disease. No acute intracranial abnormality seen. Severe multilevel degenerative disc disease. No acute abnormality seen in the cervical spine.  Electronically Signed   By: Lupita Raider, M.D.   On: 04/01/2015 14:05   Ct Cervical Spine Wo Contrast  04/04/2015  CLINICAL DATA:  Larey Seat last week. Fell from bed with trauma to the head and neck. Occipital region pain. Symptoms worsening. EXAM: CT HEAD WITHOUT CONTRAST CT CERVICAL SPINE WITHOUT CONTRAST TECHNIQUE: Multidetector CT imaging of the head and cervical spine was performed following the standard protocol without intravenous contrast. Multiplanar CT image reconstructions of the cervical spine were also generated. COMPARISON:  04/01/2015 FINDINGS: CT HEAD FINDINGS The brain shows atrophy with chronic small-vessel ischemic changes throughout the white matter, thalami I and pons. No sign of acute infarction, mass lesion, hemorrhage, hydrocephalus or extra-axial collection. No skull fracture. No fluid in the sinuses, middle ears or mastoids. Chronic calcification of the left globe but as previously seen. CT CERVICAL SPINE FINDINGS No evidence of fracture or traumatic malalignment. There chronic degenerative changes at the C1-2 articulation but without encroachment upon the neural spaces. C2-3:  Chronic facet fusion on the left.  No stenosis. C3-4: Facet degeneration on the left. Mild foraminal narrowing. No significant stenosis. C4-5: Spondylosis more prominent towards the right. No significant stenosis. C5-6:  Spondylosis.  No significant stenosis. C6-7: Spondylosis. Facet degeneration on the left. No significant stenosis. C7-T1:  Mild facet degeneration.  No stenosis. IMPRESSION: Head CT: No acute or traumatic finding. Atrophy and chronic small vessel ischemic changes. Cervical spine CT: No acute or traumatic finding. Extensive chronic degenerative changes. Electronically Signed   By: Paulina Fusi M.D.   On: 04/04/2015 10:10   Ct Cervical Spine Wo Contrast  04/01/2015  CLINICAL DATA:  Posttraumatic headache after falling out of bed 2 days ago. No reported loss of consciousness. EXAM: CT HEAD WITHOUT  CONTRAST CT CERVICAL SPINE WITHOUT CONTRAST TECHNIQUE: Multidetector CT imaging of the head and cervical spine was performed following the standard protocol without intravenous contrast. Multiplanar CT image reconstructions of the cervical spine were also generated. COMPARISON:  CT scan of head of July 10, 2014. FINDINGS: CT HEAD FINDINGS Bony calvarium appears intact. Mild diffuse cortical atrophy is noted. Mild chronic ischemic white matter disease is noted. No mass effect or midline shift is noted. Ventricular size is within normal limits. There is no evidence of mass lesion, hemorrhage or acute infarction. CT CERVICAL SPINE FINDINGS Reversal of normal lordosis is noted due to degenerative disc disease. Severe degenerative disc disease is noted  at C4-5, C5-6 and C6-7 with anterior osteophyte formation. No fracture or spondylolisthesis is noted. Visualized upper lung zones appear normal. IMPRESSION: Mild diffuse cortical atrophy. Mild chronic ischemic white matter disease. No acute intracranial abnormality seen. Severe multilevel degenerative disc disease. No acute abnormality seen in the cervical spine. Electronically Signed   By: Lupita Raider, M.D.   On: 04/01/2015 14:05    Microbiology: Recent Results (from the past 240 hour(s))  MRSA PCR Screening     Status: None   Collection Time: 04/07/15 10:29 PM  Result Value Ref Range Status   MRSA by PCR NEGATIVE NEGATIVE Final    Comment:        The GeneXpert MRSA Assay (FDA approved for NASAL specimens only), is one component of a comprehensive MRSA colonization surveillance program. It is not intended to diagnose MRSA infection nor to guide or monitor treatment for MRSA infections.   Culture, blood (routine x 2) Call MD if unable to obtain prior to antibiotics being given     Status: None (Preliminary result)   Collection Time: 04/12/15 12:12 PM  Result Value Ref Range Status   Specimen Description BLOOD RIGHT HAND  Final   Special Requests  IN PEDIATRIC BOTTLE 1CC  Final   Culture NO GROWTH 4 DAYS  Final   Report Status PENDING  Incomplete  Culture, blood (routine x 2) Call MD if unable to obtain prior to antibiotics being given     Status: None (Preliminary result)   Collection Time: 04/12/15  1:14 PM  Result Value Ref Range Status   Specimen Description BLOOD RIGHT HAND  Final   Special Requests IN PEDIATRIC BOTTLE 2CC  Final   Culture NO GROWTH 4 DAYS  Final   Report Status PENDING  Incomplete  Gram stain     Status: None   Collection Time: 04/12/15  1:55 PM  Result Value Ref Range Status   Specimen Description TRACHEAL SITE  Final   Special Requests NONE  Final   Gram Stain   Final    FEW WBC PRESENT,BOTH PMN AND MONONUCLEAR MODERATE GRAM POSITIVE RODS FEW GRAM POSITIVE COCCI IN PAIRS FEW GRAM NEGATIVE RODS SQUAMOUS EPITHELIAL CELLS PRESENT    Report Status 04/12/2015 FINAL  Final  Culture, respiratory (NON-Expectorated)     Status: None   Collection Time: 04/12/15  1:55 PM  Result Value Ref Range Status   Specimen Description SPUTUM  Final   Special Requests NONE  Final   Gram Stain   Final    FEW WBC PRESENT,BOTH PMN AND MONONUCLEAR MODERATE SQUAMOUS EPITHELIAL CELLS PRESENT MODERATE GRAM POSITIVE RODS FEW GRAM POSITIVE COCCI IN PAIRS FEW GRAM NEGATIVE RODS Performed at [facility] Performed at [facility]    Culture   Final    NORMAL OROPHARYNGEAL FLORA Performed at [facility]    Report Status 04/15/2015 FINAL  Final  MRSA PCR Screening     Status: None   Collection Time: 04/14/15  7:10 PM  Result Value Ref Range Status   MRSA by PCR NEGATIVE NEGATIVE Final    Comment:        The GeneXpert MRSA Assay (FDA approved for NASAL specimens only), is one component of a comprehensive MRSA colonization surveillance program. It is not intended to diagnose MRSA infection nor to guide or monitor treatment for MRSA infections.      Labs: Basic Metabolic Panel:  Recent  Labs Lab 04/12/15 1001 04/13/15 0529 04/14/15 0523 04/14/15 1028 04/16/15 1610 04/17/15  0751  NA 141 138 136 135 131*  --   K 4.4 3.5 4.0 4.0 4.4  --   CL 96* 96* 94* 94* 92*  --   CO2 26 28 27 27 24   --   GLUCOSE 135* 97 118* 100* 142*  --   BUN 58* 31* 48* 51* 42*  --   CREATININE 13.16* 8.54* 10.63* 11.14* 9.22*  --   CALCIUM 10.1 9.3 9.4 9.5 9.6  --   MG  --   --   --   --   --  2.1  PHOS  --   --   --  5.6*  --   --    Liver Function Tests:  Recent Labs Lab 04/13/15 0529 04/14/15 1028  AST 34  --   ALT 30  --   ALKPHOS 46  --   BILITOT 0.6  --   PROT 6.9  --   ALBUMIN 2.4* 2.2*   No results for input(s): LIPASE, AMYLASE in the last 168 hours. No results for input(s): AMMONIA in the last 168 hours. CBC:  Recent Labs Lab 04/12/15 1001 04/13/15 0529 04/14/15 0523 04/14/15 1028 04/16/15 0822  WBC 12.1* 8.6 7.4 8.2 8.1  NEUTROABS 10.4*  --   --   --   --   HGB 9.6* 8.2* 8.2* 8.6* 8.1*  HCT 29.9* 26.2* 24.4* 25.5* 24.9*  MCV 93.7 94.2 92.8 93.1 91.5  PLT 270 261 237 260 284   Cardiac Enzymes:  Recent Labs Lab 04/12/15 1901 04/13/15 0529  TROPONINI 0.11* 0.10*   BNP: BNP (last 3 results) No results for input(s): BNP in the last 8760 hours.  ProBNP (last 3 results) No results for input(s): PROBNP in the last 8760 hours.  CBG:  Recent Labs Lab 04/16/15 0127 04/16/15 0748 04/16/15 1208 04/16/15 2150 04/17/15 0758  GLUCAP 133* 142* 173* 136* 117*       Signed:  Hartley Barefoot A MD.  [facility] 04/17/2015, 10:16 AM

## 2015-04-17 NOTE — Progress Notes (Signed)
Agree with plan outlined by Dr. Salena Saner yesterday for amiodarone taper going home. A follow-up appointment has been scheduled on 3/27 with Jacolyn ReedyMichele Lenze[facility] Continue warfarin per pharmacy recommendations. Cardiology will sign-off. Call with questions.  Chrystie NoseKenneth C. Hilty, MD, [facility] Surgery CenterFACC Attending Cardiologist [facility]

## 2015-04-18 DIAGNOSIS — J9601 Acute respiratory failure with hypoxia: Secondary | ICD-10-CM | POA: Insufficient documentation

## 2015-04-19 ENCOUNTER — Encounter: Payer: Medicare Other | Admitting: Physician Assistant

## 2015-04-20 ENCOUNTER — Inpatient Hospital Stay (HOSPITAL_COMMUNITY)
Admission: EM | Admit: 2015-04-20 | Discharge: 2015-04-23 | DRG: 193 | Disposition: A | Discharge status: Skilled Nursing Facility | Payer: Medicare Other | Attending: Internal Medicine | Admitting: Internal Medicine

## 2015-04-20 ENCOUNTER — Encounter (HOSPITAL_COMMUNITY): Payer: Self-pay

## 2015-04-20 ENCOUNTER — Emergency Department (HOSPITAL_COMMUNITY): Payer: Medicare Other

## 2015-04-20 DIAGNOSIS — Y95 Nosocomial condition: Secondary | ICD-10-CM | POA: Diagnosis present

## 2015-04-20 DIAGNOSIS — Z811 Family history of alcohol abuse and dependence: Secondary | ICD-10-CM

## 2015-04-20 DIAGNOSIS — Z7982 Long term (current) use of aspirin: Secondary | ICD-10-CM

## 2015-04-20 DIAGNOSIS — I1311 Hypertensive heart and chronic kidney disease without heart failure, with stage 5 chronic kidney disease, or end stage renal disease: Secondary | ICD-10-CM | POA: Diagnosis present

## 2015-04-20 DIAGNOSIS — H5440 Blindness, one eye, unspecified eye: Secondary | ICD-10-CM | POA: Diagnosis present

## 2015-04-20 DIAGNOSIS — D631 Anemia in chronic kidney disease: Secondary | ICD-10-CM | POA: Diagnosis present

## 2015-04-20 DIAGNOSIS — Z7901 Long term (current) use of anticoagulants: Secondary | ICD-10-CM

## 2015-04-20 DIAGNOSIS — J9601 Acute respiratory failure with hypoxia: Secondary | ICD-10-CM | POA: Diagnosis present

## 2015-04-20 DIAGNOSIS — R0902 Hypoxemia: Secondary | ICD-10-CM

## 2015-04-20 DIAGNOSIS — Z8701 Personal history of pneumonia (recurrent): Secondary | ICD-10-CM

## 2015-04-20 DIAGNOSIS — E118 Type 2 diabetes mellitus with unspecified complications: Secondary | ICD-10-CM

## 2015-04-20 DIAGNOSIS — J189 Pneumonia, unspecified organism: Secondary | ICD-10-CM | POA: Diagnosis present

## 2015-04-20 DIAGNOSIS — M109 Gout, unspecified: Secondary | ICD-10-CM | POA: Diagnosis present

## 2015-04-20 DIAGNOSIS — J811 Chronic pulmonary edema: Secondary | ICD-10-CM | POA: Diagnosis present

## 2015-04-20 DIAGNOSIS — E785 Hyperlipidemia, unspecified: Secondary | ICD-10-CM | POA: Diagnosis present

## 2015-04-20 DIAGNOSIS — H5442 Blindness, left eye, normal vision right eye: Secondary | ICD-10-CM | POA: Diagnosis present

## 2015-04-20 DIAGNOSIS — G629 Polyneuropathy, unspecified: Secondary | ICD-10-CM | POA: Diagnosis present

## 2015-04-20 DIAGNOSIS — I480 Paroxysmal atrial fibrillation: Secondary | ICD-10-CM | POA: Diagnosis present

## 2015-04-20 DIAGNOSIS — G9340 Encephalopathy, unspecified: Secondary | ICD-10-CM | POA: Diagnosis present

## 2015-04-20 DIAGNOSIS — R0602 Shortness of breath: Secondary | ICD-10-CM

## 2015-04-20 DIAGNOSIS — H409 Unspecified glaucoma: Secondary | ICD-10-CM | POA: Diagnosis present

## 2015-04-20 DIAGNOSIS — N186 End stage renal disease: Secondary | ICD-10-CM | POA: Diagnosis present

## 2015-04-20 DIAGNOSIS — Z8249 Family history of ischemic heart disease and other diseases of the circulatory system: Secondary | ICD-10-CM

## 2015-04-20 DIAGNOSIS — Z992 Dependence on renal dialysis: Secondary | ICD-10-CM

## 2015-04-20 DIAGNOSIS — R531 Weakness: Secondary | ICD-10-CM | POA: Diagnosis present

## 2015-04-20 DIAGNOSIS — R5381 Other malaise: Secondary | ICD-10-CM

## 2015-04-20 DIAGNOSIS — E1122 Type 2 diabetes mellitus with diabetic chronic kidney disease: Secondary | ICD-10-CM | POA: Diagnosis present

## 2015-04-20 DIAGNOSIS — E11649 Type 2 diabetes mellitus with hypoglycemia without coma: Secondary | ICD-10-CM | POA: Diagnosis present

## 2015-04-20 LAB — CBC
HCT: 27.9 % — ABNORMAL LOW (ref 39.0–52.0)
HEMOGLOBIN: 9.3 g/dL — AB (ref 13.0–17.0)
MCH: 30.7 pg (ref 26.0–34.0)
MCHC: 33.3 g/dL (ref 30.0–36.0)
MCV: 92.1 fL (ref 78.0–100.0)
Platelets: 268 10*3/uL (ref 150–400)
RBC: 3.03 MIL/uL — ABNORMAL LOW (ref 4.22–5.81)
RDW: 15.8 % — AB (ref 11.5–15.5)
WBC: 7.4 10*3/uL (ref 4.0–10.5)

## 2015-04-20 LAB — CBG MONITORING, ED
GLUCOSE-CAPILLARY: 32 mg/dL — AB (ref 65–99)
GLUCOSE-CAPILLARY: 72 mg/dL (ref 65–99)
GLUCOSE-CAPILLARY: 71 mg/dL (ref 65–99)
GLUCOSE-CAPILLARY: 63 mg/dL — AB (ref 65–99)
Glucose-Capillary: 101 mg/dL — ABNORMAL HIGH (ref 65–99)
Glucose-Capillary: 42 mg/dL — CL (ref 65–99)

## 2015-04-20 LAB — PROCALCITONIN: Procalcitonin: 4.4 ng/mL

## 2015-04-20 LAB — I-STAT ARTERIAL BLOOD GAS, ED
ACID-BASE EXCESS: 6 mmol/L — AB (ref 0.0–2.0)
Bicarbonate: 30.9 mEq/L — ABNORMAL HIGH (ref 20.0–24.0)
O2 SAT: 96 %
PH ART: 7.462 — AB (ref 7.350–7.450)
PO2 ART: 80 mmHg (ref 80.0–100.0)
Patient temperature: 98.2
TCO2: 32 mmol/L (ref 0–100)
pCO2 arterial: 43.2 mmHg (ref 35.0–45.0)

## 2015-04-20 LAB — PROTIME-INR
INR: 2.81 — AB (ref 0.00–1.49)
PROTHROMBIN TIME: 29.1 s — AB (ref 11.6–15.2)

## 2015-04-20 LAB — I-STAT CG4 LACTIC ACID, ED
LACTIC ACID, VENOUS: 0.87 mmol/L (ref 0.5–2.0)
Lactic Acid, Venous: 1.23 mmol/L (ref 0.5–2.0)

## 2015-04-20 LAB — BASIC METABOLIC PANEL
ANION GAP: 17 — AB (ref 5–15)
BUN: 36 mg/dL — ABNORMAL HIGH (ref 6–20)
CALCIUM: 10 mg/dL (ref 8.9–10.3)
CO2: 26 mmol/L (ref 22–32)
Chloride: 96 mmol/L — ABNORMAL LOW (ref 101–111)
Creatinine, Ser: 8.41 mg/dL — ABNORMAL HIGH (ref 0.61–1.24)
GFR calc Af Amer: 6 mL/min — ABNORMAL LOW (ref >60)
GFR, EST NON AFRICAN AMERICAN: 5 mL/min — AB (ref >60)
GLUCOSE: 109 mg/dL — AB (ref 65–99)
Potassium: 4.1 mmol/L (ref 3.5–5.1)
SODIUM: 139 mmol/L (ref 135–145)

## 2015-04-20 LAB — AMMONIA: AMMONIA: 22 umol/L (ref 9–35)

## 2015-04-20 MED ORDER — DARBEPOETIN ALFA 60 MCG/0.3ML IJ SOSY
60.0000 ug | PREFILLED_SYRINGE | INTRAMUSCULAR | Status: DC
  Administered 2015-04-21: 60 ug via INTRAVENOUS
  Filled 2015-04-20: qty 0.3

## 2015-04-20 MED ORDER — WARFARIN - PHARMACIST DOSING INPATIENT: Freq: Every day | Status: DC

## 2015-04-20 MED ORDER — KCL IN DEXTROSE-NACL 40-5-0.9 MEQ/L-%-% IV SOLN
INTRAVENOUS | Status: DC
  Administered 2015-04-20 – 2015-04-21 (×2): via INTRAVENOUS
  Filled 2015-04-20 (×2): qty 1000

## 2015-04-20 MED ORDER — SODIUM CHLORIDE 0.9 % IV BOLUS (SEPSIS)
500.0000 mL | Freq: Once | INTRAVENOUS | Status: AC
  Administered 2015-04-20: 500 mL via INTRAVENOUS

## 2015-04-20 MED ORDER — ASPIRIN EC 81 MG PO TBEC
81.0000 mg | DELAYED_RELEASE_TABLET | Freq: Every day | ORAL | Status: DC
  Administered 2015-04-20 – 2015-04-23 (×4): 81 mg via ORAL
  Filled 2015-04-20 (×4): qty 1

## 2015-04-20 MED ORDER — DORZOLAMIDE HCL-TIMOLOL MAL 2-0.5 % OP SOLN
1.0000 [drp] | Freq: Two times a day (BID) | OPHTHALMIC | Status: DC
  Administered 2015-04-21 – 2015-04-22 (×4): 1 [drp] via OPHTHALMIC
  Filled 2015-04-20: qty 10

## 2015-04-20 MED ORDER — ONDANSETRON HCL 4 MG/2ML IJ SOLN: 4.0000 mg | Freq: Four times a day (QID) | INTRAMUSCULAR | Status: DC | PRN

## 2015-04-20 MED ORDER — AMIODARONE HCL 200 MG PO TABS
400.0000 mg | ORAL_TABLET | Freq: Every day | ORAL | Status: DC
  Administered 2015-04-20 – 2015-04-23 (×4): 400 mg via ORAL
  Filled 2015-04-20 (×4): qty 2

## 2015-04-20 MED ORDER — INSULIN ASPART 100 UNIT/ML ~~LOC~~ SOLN: 0.0000 [IU] | Freq: Three times a day (TID) | SUBCUTANEOUS | Status: DC

## 2015-04-20 MED ORDER — PREDNISOLONE ACETATE 1 % OP SUSP
1.0000 [drp] | Freq: Two times a day (BID) | OPHTHALMIC | Status: DC
  Administered 2015-04-21 – 2015-04-22 (×4): 1 [drp] via OPHTHALMIC
  Filled 2015-04-20: qty 1

## 2015-04-20 MED ORDER — SODIUM CHLORIDE 0.9% FLUSH: 3.0000 mL | Freq: Two times a day (BID) | INTRAVENOUS | Status: DC

## 2015-04-20 MED ORDER — IRBESARTAN 150 MG PO TABS
150.0000 mg | ORAL_TABLET | Freq: Every day | ORAL | Status: DC
  Administered 2015-04-20: 150 mg via ORAL
  Filled 2015-04-20: qty 1

## 2015-04-20 MED ORDER — HEPARIN SODIUM (PORCINE) 1000 UNIT/ML DIALYSIS: 1000.0000 [IU] | INTRAMUSCULAR | Status: DC | PRN

## 2015-04-20 MED ORDER — SODIUM CHLORIDE 0.9 % IV SOLN: 100.0000 mL | INTRAVENOUS | Status: DC | PRN

## 2015-04-20 MED ORDER — CINACALCET HCL 30 MG PO TABS
60.0000 mg | ORAL_TABLET | Freq: Every day | ORAL | Status: DC
  Administered 2015-04-21 – 2015-04-22 (×3): 60 mg via ORAL
  Filled 2015-04-20 (×3): qty 2

## 2015-04-20 MED ORDER — DEXTROSE 50 % IV SOLN
1.0000 | Freq: Once | INTRAVENOUS | Status: AC
  Administered 2015-04-20: 50 mL via INTRAVENOUS
  Filled 2015-04-20: qty 50

## 2015-04-20 MED ORDER — AMLODIPINE BESYLATE 10 MG PO TABS
10.0000 mg | ORAL_TABLET | Freq: Every day | ORAL | Status: DC
  Administered 2015-04-21 – 2015-04-22 (×2): 10 mg via ORAL
  Filled 2015-04-20 (×2): qty 1

## 2015-04-20 MED ORDER — LIDOCAINE-PRILOCAINE 2.5-2.5 % EX CREA: 1.0000 "application " | TOPICAL_CREAM | CUTANEOUS | Status: DC | PRN

## 2015-04-20 MED ORDER — INSULIN ASPART 100 UNIT/ML ~~LOC~~ SOLN: 0.0000 [IU] | Freq: Every day | SUBCUTANEOUS | Status: DC

## 2015-04-20 MED ORDER — VANCOMYCIN HCL 10 G IV SOLR
2000.0000 mg | Freq: Once | INTRAVENOUS | Status: AC
  Administered 2015-04-20: 2000 mg via INTRAVENOUS
  Filled 2015-04-20: qty 2000

## 2015-04-20 MED ORDER — HEPARIN SODIUM (PORCINE) 1000 UNIT/ML DIALYSIS
4000.0000 [IU] | Freq: Once | INTRAMUSCULAR | Status: AC
  Administered 2015-04-21: 4000 [IU] via INTRAVENOUS_CENTRAL

## 2015-04-20 MED ORDER — CEFEPIME HCL 2 G IJ SOLR
2.0000 g | INTRAMUSCULAR | Status: DC
  Administered 2015-04-21: 2 g via INTRAVENOUS
  Filled 2015-04-20 (×2): qty 2

## 2015-04-20 MED ORDER — CALCIUM ACETATE (PHOS BINDER) 667 MG PO CAPS
1334.0000 mg | ORAL_CAPSULE | Freq: Three times a day (TID) | ORAL | Status: DC
  Administered 2015-04-21 – 2015-04-23 (×6): 1334 mg via ORAL
  Filled 2015-04-20 (×6): qty 2

## 2015-04-20 MED ORDER — ATROPINE SULFATE 1 % OP SOLN
1.0000 [drp] | Freq: Two times a day (BID) | OPHTHALMIC | Status: DC
  Administered 2015-04-21 – 2015-04-23 (×4): 1 [drp] via OPHTHALMIC
  Filled 2015-04-20: qty 2

## 2015-04-20 MED ORDER — ALTEPLASE 2 MG IJ SOLR: 2.0000 mg | Freq: Once | INTRAMUSCULAR | Status: DC | PRN

## 2015-04-20 MED ORDER — LIDOCAINE HCL (PF) 1 % IJ SOLN: 5.0000 mL | INTRAMUSCULAR | Status: DC | PRN

## 2015-04-20 MED ORDER — IPRATROPIUM-ALBUTEROL 0.5-2.5 (3) MG/3ML IN SOLN
3.0000 mL | Freq: Once | RESPIRATORY_TRACT | Status: AC
  Administered 2015-04-20: 3 mL via RESPIRATORY_TRACT
  Filled 2015-04-20: qty 3

## 2015-04-20 MED ORDER — TOBRAMYCIN-DEXAMETHASONE 0.3-0.1 % OP SUSP
1.0000 [drp] | Freq: Four times a day (QID) | OPHTHALMIC | Status: DC
  Administered 2015-04-21 – 2015-04-23 (×8): 1 [drp] via OPHTHALMIC
  Filled 2015-04-20: qty 2.5

## 2015-04-20 MED ORDER — SIMVASTATIN 20 MG PO TABS
20.0000 mg | ORAL_TABLET | Freq: Every evening | ORAL | Status: DC
  Administered 2015-04-21 – 2015-04-22 (×2): 20 mg via ORAL
  Filled 2015-04-20 (×2): qty 1

## 2015-04-20 MED ORDER — PENTAFLUOROPROP-TETRAFLUOROETH EX AERO: 1.0000 "application " | INHALATION_SPRAY | CUTANEOUS | Status: DC | PRN

## 2015-04-20 MED ORDER — WARFARIN SODIUM 7.5 MG PO TABS
7.5000 mg | ORAL_TABLET | Freq: Once | ORAL | Status: DC
  Filled 2015-04-20 (×2): qty 1

## 2015-04-20 MED ORDER — LATANOPROST 0.005 % OP SOLN
1.0000 [drp] | Freq: Every day | OPHTHALMIC | Status: DC
  Administered 2015-04-21 – 2015-04-22 (×2): 1 [drp] via OPHTHALMIC
  Filled 2015-04-20: qty 2.5

## 2015-04-20 MED ORDER — DEXTROSE 5 % IV SOLN
2.0000 g | Freq: Once | INTRAVENOUS | Status: AC
  Administered 2015-04-20: 2 g via INTRAVENOUS
  Filled 2015-04-20: qty 2

## 2015-04-20 MED ORDER — VANCOMYCIN HCL IN DEXTROSE 1-5 GM/200ML-% IV SOLN
1000.0000 mg | INTRAVENOUS | Status: DC
  Administered 2015-04-21: 1000 mg via INTRAVENOUS
  Filled 2015-04-20: qty 200

## 2015-04-20 MED ORDER — ONDANSETRON HCL 4 MG PO TABS: 4.0000 mg | ORAL_TABLET | Freq: Four times a day (QID) | ORAL | Status: DC | PRN

## 2015-04-20 NOTE — ED Notes (Signed)
PA at bedside for assessment

## 2015-04-20 NOTE — ED Notes (Signed)
Diet tray deivered, spouse at bedside assisting patient with meal

## 2015-04-20 NOTE — ED Notes (Signed)
Lunch tray ordered, awaiting bed assignment

## 2015-04-20 NOTE — ED Notes (Signed)
Patient transported to CT 

## 2015-04-20 NOTE — ED Notes (Signed)
Ordered diet tray 

## 2015-04-20 NOTE — Consult Note (Addendum)
Renal Service Consult Note [facility]  [name] [name] 04/20/2015 [name] [name] D Requesting Physician:  Dr Konrad Dolores  Reason for Consult:   HPI: The patient is a 82 y.o. year-old with hx of DM, HTN, afib , glaucoma, blind one eye, gout and ESRD on HD 6 years.  [facility].  Had HD yesterday.  Comes in brought by wife for severe weakness, unable to walk across the room.  Was just here last week for "HCAP".  CXR today showing severe bilat pulm edema changes.  Pt poor historian.  Wife says yes to SOB.  No cough or fevers.  NO CP.    ROS  denies CP  no joint pain   no HA  no blurry vision  no rash  no diarrhea  no nausea/ vomiting  no dysuria  no difficulty voiding  no change in urine color    Past Medical History  Past Medical History  Diagnosis Date  . Diabetes mellitus without complication (HCC)   . Atrial fibrillation (HCC)   . Hypertension   . Glaucoma   . Peripheral neuropathy (HCC)   . Gout   . Anemia   . ESRD (end stage renal disease) on dialysis [facility])     "MWF; [address]" (04/08/2015)   Past Surgical History  Past Surgical History  Procedure Laterality Date  . Ablation of dysrhythmic focus    . Sp av dialysis shunt access existing *l*     Family History  Family History  Problem Relation Age of Onset  . Alcoholism Brother   . Alcoholism Brother   . Other Mother     bowel obstruction  . Heart failure Father     fluid bluid up   Social History  reports that he has never smoked. He has never used smokeless tobacco. He reports that he does not drink alcohol or use illicit drugs. Allergies  Allergies  Allergen Reactions  . Diltiazem Other (See Comments)    SEVERE STOMACH CRAMPING PER PT   Home medications Prior to Admission medications   Medication Sig Start Date End Date Taking? Authorizing Provider  aspirin EC 81 MG tablet Take 81 mg by mouth daily.   Yes Historical Provider, MD  atropine 1 % ophthalmic  solution Place 1 drop into the left eye 2 times daily at 12 noon and 4 pm.   Yes Historical Provider, MD  dorzolamide-timolol (COSOPT) 22.3-6.8 MG/ML ophthalmic solution Place 1 drop into the right eye 2 (two) times daily.    Yes Historical Provider, MD  prednisoLONE acetate (PRED FORTE) 1 % ophthalmic suspension Place 1 drop into the left eye 2 (two) times daily. Patient to use for 37 days. Filled on 03-28-15   Yes Historical Provider, MD  warfarin (COUMADIN) 5 MG tablet Take 7.5 mg daily, except on M, W, F . On M, W, F take 5 mg by mouth. 04/17/15  Yes Belkys A Regalado, MD  acetaminophen (TYLENOL) 325 MG tablet Take 650 mg by mouth daily as needed for mild pain.    Historical Provider, MD  albuterol (PROVENTIL HFA;VENTOLIN HFA) 108 (90 Base) MCG/ACT inhaler Inhale 1-2 puffs into the lungs every 6 (six) hours as needed for wheezing or shortness of breath.  03/29/15 03/28/16  Historical Provider, MD  amiodarone (PACERONE) 200 MG tablet Take 1 tablet (200 mg total) by mouth daily. 04/23/15   Belkys A Regalado, MD  amiodarone (PACERONE) 400 MG tablet Take 1 tablet (400 mg total)  by mouth daily. 04/17/15   Belkys A Regalado, MD  amLODipine (NORVASC) 10 MG tablet Take 10 mg by mouth at bedtime.  01/09/14   Historical Provider, MD  Calcium Acetate 667 MG TABS Take 1-2 capsules by mouth 3 (three) times daily. 667 mg for snacks, 1334 mg for meals    Historical Provider, MD  docusate sodium (COLACE) 100 MG capsule Take 100 mg by mouth 2 (two) times daily.    Historical Provider, MD  folic acid-vitamin b complex-vitamin c-selenium-zinc (DIALYVITE) 3 MG TABS tablet Take 1 tablet by mouth daily.    Historical Provider, MD  lidocaine-prilocaine (EMLA) cream Apply 1 application topically as needed (apply to skin near dialysis port before dialysis).  08/31/14   Historical Provider, MD  Multiple Vitamin (MULTIVITAMIN WITH MINERALS) TABS tablet Take 1 tablet by mouth daily. 04/17/15   Belkys A Regalado, MD  SENSIPAR 60 MG  tablet Take 60 mg by mouth at bedtime.  09/03/14   Historical Provider, MD  simvastatin (ZOCOR) 20 MG tablet Take 20 mg by mouth every evening.    Historical Provider, MD  tobramycin-dexamethasone [name](TOBRADEX) ophthalmic solution Place 1 drop into the left eye 4 (four) times daily. For 37 days. Filled 03-31-15    Historical Provider, MD  TRAVATAN Z 0.004 % SOLN ophthalmic solution Place 1 drop into the right eye at bedtime.  08/30/14   Historical Provider, MD  valsartan (DIOVAN) 160 MG tablet Take 160 mg by mouth at bedtime.     Historical Provider, MD   Liver Function Tests  Recent Labs Lab 04/14/15 1028  ALBUMIN 2.2*   No results for input(s): LIPASE, AMYLASE in the last 168 hours. CBC  Recent Labs Lab 04/14/15 1028 04/16/15 0822 04/20/15 1044  WBC 8.2 8.1 7.4  HGB 8.6* 8.1* 9.3*  HCT 25.5* 24.9* 27.9*  MCV 93.1 91.5 92.1  PLT 260 284 268   Basic Metabolic Panel  Recent Labs Lab 04/14/15 0523 04/14/15 1028 04/16/15 0822 04/20/15 1044  NA 136 135 131* 139  K 4.0 4.0 4.4 4.1  CL 94* 94* 92* 96*  CO2 27 27 24 26   GLUCOSE 118* 100* 142* 109*  BUN 48* 51* 42* 36*  CREATININE 10.63* 11.14* 9.22* 8.41*  CALCIUM 9.4 9.5 9.6 10.0  PHOS  --  5.6*  --   --     Filed Vitals:   04/20/15 1420 04/20/15 1445 04/20/15 1515 04/20/15 1545  BP:  143/72 134/76 165/82  Pulse: 66 61 66 66  Temp:      TempSrc:      Resp: 21 26 22 24   SpO2: 100% 100% 100% 99%   Exam BP 160/82   P 60  RR 18  Temp 98.2  2L [place] 98% Gen is awake, conversant , a bit confused perhaps Sclera anicteric, throat clear ++ jvd to angle of jaw Chest bibasilar crackles RRR no MRG Abd soft ntnd no mass or ascites +bs GU normal male MS no joint effusions or deformity Ext no edema / wounds/ ulcers, darkening of skin bilat feet Neuro is alert, Ox 3 , nf, gen'd weakness    [facility]: [facility] M[facility]   4h  104kg   3/2.25 bath   LUA AVF   Hep 4200 8.3/ 48%/ -- > mircera 75 q2 last 3/8 9.2/ 4.9/ 229 pth > hect 5, phoslo  2ac, sensipar 60 BP > norvasc 10/ valsartan 80   Assessment: 1 Gen weakness - severe pulm edema may be primary issue. Can't walk across the  room, very poor historian but CXR is very abnormal. Would do HD tonight and tomorrow and repeat CXR.   2 ESRD MWF HD 3 Afib on amio/ coumadin 4 HTN on norvasc/ valsartan, bp's good 5 Volume - get bed wt when upstairs, dry wt 104kg 6 Blind one eye 7 MBD - cont meds 8 Anemia - Hb 8-9 cont esa , next dose tomorrow 9 MBD - Ca high, dc vit D 10 DM - on glipizide at home, BS"s low "25 " at night recently per pts wife. On hold.    Plan - extra HD tonight, reg HD tomorrow, get vol down and repeat CXR  Vinson Moselle MD [facility] pager [phone]    cell [phone] 04/20/2015, 5:04 PM

## 2015-04-20 NOTE — Progress Notes (Signed)
Pharmacy Antibiotic Note  Orlene ErmRichard G Goecke is a 82 y.o. male admitted on 04/20/2015 with pneumonia.  Pharmacy has been consulted for vancomycin and cefepime dosing. Pt is afebrile and WBC is WNL.   Plan: - Vancomycin 2gm IV x 1 then 1gm IV post-HD MWF - Cefepime 2gm IV x 1 then 2gm post-HD MWF - F/u renal plans, C&S, clinical status and pre-HD level when appropriate     Temp (24hrs), Avg:98.3 F (36.8 C), Min:98.2 F (36.8 C), Max:98.4 F (36.9 C)   Recent Labs Lab 04/14/15 0523 04/14/15 1028 04/16/15 0822 04/20/15 1044 04/20/15 1213  WBC 7.4 8.2 8.1 7.4  --   CREATININE 10.63* 11.14* 9.22* 8.41*  --   LATICACIDVEN  --   --   --   --  1.23    Estimated Creatinine Clearance: 8.7 mL/min (by C-G formula based on Cr of 8.41).    Allergies  Allergen Reactions  . Diltiazem Other (See Comments)    SEVERE STOMACH CRAMPING PER PT    Antimicrobials this admission: Vanc 3/21>> Cefepime 3/21>>  Dose adjustments this admission: N/A  Microbiology results: Pending  Thank you for allowing pharmacy to be a part of this patient's care.  Jamario Colina, Drake LeachRachel Lynn 04/20/2015 12:17 PM

## 2015-04-20 NOTE — ED Notes (Signed)
No insulin given with lunch due to blood sugar 32 post meal. Patient alert and oriented, OJ given and dextrose with potassium up

## 2015-04-20 NOTE — Progress Notes (Signed)
ANTICOAGULATION CONSULT NOTE - Initial Consult  Pharmacy Consult for warfarin Indication: atrial fibrillation  Allergies  Allergen Reactions  . Diltiazem Other (See Comments)    SEVERE STOMACH CRAMPING PER PT    Patient Measurements:    Vital Signs: Temp: 98.2 F (36.8 C) (03/21 1048) Temp Source: Rectal (03/21 1048) BP: 140/71 mmHg (03/21 1245) Pulse Rate: 60 (03/21 1245)  Labs:  Recent Labs  04/20/15 1044  HGB 9.3*  HCT 27.9*  PLT 268  LABPROT 29.1*  INR 2.81*  CREATININE 8.41*    Estimated Creatinine Clearance: 8.7 mL/min (by C-G formula based on Cr of 8.41).   Medical History: Past Medical History  Diagnosis Date  . Diabetes mellitus without complication (HCC)   . Atrial fibrillation (HCC)   . Hypertension   . Glaucoma   . Peripheral neuropathy (HCC)   . Gout   . Anemia   . ESRD (end stage renal disease) on dialysis [facility](HCC)     "MWF; [address]" (04/08/2015)   Assessment: [age] yom presented to the ED with generalized weakness. He is on chronic coumadin for history of afib. INR is therapeutic at 2.81. H/H low and platelets are WNL. No bleeding noted.   Goal of Therapy:  INR 2-3 Monitor platelets by anticoagulation protocol: Yes   Plan:  - Warfarin 7.5mg  PO x 1 tonight (normal home dose) - Daily INR  Rufus Cypert, Drake Leachachel Lynn 04/20/2015,1:29 PM

## 2015-04-20 NOTE — H&P (Signed)
[facility] History and Physical  [name] [name] ZOX:[id number] DOB: December 31, 1933 DOA: 04/20/2015  Referring physician: Dr Devin GoingYelverton - [facility] PCP: Katy ApoPOLITE,RONALD D, MD   Chief Complaint: Generalized weakness and confusion  HPI: [name] [name] is a 82 y.o. male  Level V caveat applies: Patient unable to provide reliable history and wife who is present with patient is more extended watching [name] in providing history. Multiple attempts were made to engage the wife in history but she continued to give conflicting history and would say I don't know and continue to turn around and watch the TV.  History provided by EDP, and end on a limited basis by patient and wife. Patient was discharged from [facility] on 04/17/2015 after admission for acute respiratory failure secondary to HcAP and generalized weakness. Per patient's wife he has been very weak and altered in his mentation for greater than 10 days. She was unable to state any change in his physical or mental status since discharge on 04/17/2015. Patient received his full dialysis session on 04/19/2015 4 hours. Family states medical compliance with medications. Per discharge summary patient should've finished his last dose of antibiotics for his pneumonia one day ago. Patient is without focal complaint.   Review of Systems:  Unable to obtain further review systems due to patient's mental status.   Past Medical History  Diagnosis Date  . Diabetes mellitus without complication (HCC)   . Atrial fibrillation (HCC)   . Hypertension   . Glaucoma   . Peripheral neuropathy (HCC)   . Gout   . Anemia   . ESRD (end stage renal disease) on dialysis [facility](HCC)     "MWF; [address]" (04/08/2015)   Past Surgical History  Procedure Laterality Date  . Ablation of dysrhythmic focus    . Sp av dialysis shunt access existing *l*     Social History:  reports that he has never smoked. He has never used smokeless tobacco. He  reports that he does not drink alcohol or use illicit drugs.  Allergies  Allergen Reactions  . Diltiazem Other (See Comments)    SEVERE STOMACH CRAMPING PER PT    Family History  Problem Relation Age of Onset  . Alcoholism Brother   . Alcoholism Brother   . Other Mother     bowel obstruction  . Heart failure Father     fluid bluid up     Prior to Admission medications   Medication Sig Start Date End Date Taking? Authorizing Provider  acetaminophen (TYLENOL) 325 MG tablet Take 650 mg by mouth daily as needed for mild pain.    Historical Provider, MD  albuterol (PROVENTIL HFA;VENTOLIN HFA) 108 (90 Base) MCG/ACT inhaler Inhale 1-2 puffs into the lungs every 6 (six) hours as needed for wheezing or shortness of breath.  03/29/15 03/28/16  Historical Provider, MD  amiodarone (PACERONE) 200 MG tablet Take 1 tablet (200 mg total) by mouth daily. 04/23/15   Belkys A Regalado, MD  amiodarone (PACERONE) 400 MG tablet Take 1 tablet (400 mg total) by mouth daily. 04/17/15   Belkys A Regalado, MD  amLODipine (NORVASC) 10 MG tablet Take 10 mg by mouth at bedtime.  01/09/14   Historical Provider, MD  aspirin EC 81 MG tablet Take 81 mg by mouth daily.    Historical Provider, MD  atropine 1 % ophthalmic solution Place 1 drop into the left eye 2 times daily at 12 noon and 4 pm.    Historical Provider, MD  Calcium Acetate 667 MG TABS Take 1-2 capsules by mouth 3 (three) times daily. 667 mg for snacks, 1334 mg for meals    Historical Provider, MD  docusate sodium (COLACE) 100 MG capsule Take 100 mg by mouth 2 (two) times daily.    Historical Provider, MD  dorzolamide-timolol (COSOPT) 22.3-6.8 MG/ML ophthalmic solution Place 1 drop into the right eye 2 (two) times daily.     Historical Provider, MD  folic acid-vitamin b complex-vitamin c-selenium-zinc (DIALYVITE) 3 MG TABS tablet Take 1 tablet by mouth daily.    Historical Provider, MD  lidocaine-prilocaine (EMLA) cream Apply 1 application topically as needed  (apply to skin near dialysis port before dialysis).  08/31/14   Historical Provider, MD  Multiple Vitamin (MULTIVITAMIN WITH MINERALS) TABS tablet Take 1 tablet by mouth daily. 04/17/15   Belkys A Regalado, MD  prednisoLONE acetate (PRED FORTE) 1 % ophthalmic suspension Place 1 drop into the left eye 2 (two) times daily. Patient to use for 37 days. Filled on 03-28-15    Historical Provider, MD  SENSIPAR 60 MG tablet Take 60 mg by mouth at bedtime.  09/03/14   Historical Provider, MD  simvastatin (ZOCOR) 20 MG tablet Take 20 mg by mouth every evening.    Historical Provider, MD  tobramycin-dexamethasone [facility]) ophthalmic solution Place 1 drop into the left eye 4 (four) times daily. For 37 days. Filled 03-31-15    Historical Provider, MD  TRAVATAN Z 0.004 % SOLN ophthalmic solution Place 1 drop into the right eye at bedtime.  08/30/14   Historical Provider, MD  valsartan (DIOVAN) 160 MG tablet Take 160 mg by mouth at bedtime.     Historical Provider, MD  warfarin (COUMADIN) 5 MG tablet Take 7.5 mg daily, except on M, W, F . On M, W, F take 5 mg by mouth. 04/17/15   Alba Cory, MD   Physical Exam: Filed Vitals:   04/20/15 1045 04/20/15 1048 04/20/15 1200 04/20/15 1245  BP: 135/68  143/74 140/71  Pulse: 70  72 60  Temp:  98.2 F (36.8 C)    TempSrc:  Rectal    Resp: SpO2: 95%  100% 99%    Wt Readings from Last 3 Encounters:  04/16/15 87.5 kg (192 lb 14.4 oz)  04/08/15 103.3 kg (227 lb 11.8 oz)  04/01/15 102.059 kg (225 lb)    General:  Appears calm and comfortable ENT: Dry mm Neck:  no LAD, masses or thyromegaly Cardiovascular:  RRR, II/VI systolic murmur. No LE edema.  Respiratory:  Scattered rhonchi. No wheezes. Mild increased effort. Decrease in bases. Abdomen:  soft, ntnd Skin:  no rash or induration seen on limited exam Musculoskeletal: Global weakness but symmetrical Psychiatric: Patient very difficult to understand due to slurring of words which is baseline for  patient. Gives unintelligible answers that are unrelated to the question asked. grossly normal mood and affect, speech fluent and appropriate Neurologic:  Unable to fully ascertain neurological status due to patient's mental status.           Labs on Admission:  Basic Metabolic Panel:  Recent Labs Lab 04/14/15 0523 04/14/15 1028 04/16/15 0822 04/17/15 0751 04/20/15 1044  NA 136 135 131*  --  139  K 4.0 4.0 4.4  --  4.1  CL 94* 94* 92*  --  96*  CO2 --  26  GLUCOSE 118* 100* 142*  --  109*  BUN 48* 51* 42*  --  36*  CREATININE 10.63* 11.14* 9.22*  --  8.41*  CALCIUM 9.4 9.5 9.6  --  10.0  MG  --   --   --  2.1  --   PHOS  --  5.6*  --   --   --    Liver Function Tests:  Recent Labs Lab 04/14/15 1028  ALBUMIN 2.2*   No results for input(s): LIPASE, AMYLASE in the last 168 hours. No results for input(s): AMMONIA in the last 168 hours. CBC:  Recent Labs Lab 04/14/15 0523 04/14/15 1028 04/16/15 0822 04/20/15 1044  WBC 7.4 8.2 8.1 7.4  HGB 8.2* 8.6* 8.1* 9.3*  HCT 24.4* 25.5* 24.9* 27.9*  MCV 92.8 93.1 91.5 92.1  PLT 237 260 284 268   Cardiac Enzymes: No results for input(s): CKTOTAL, CKMB, CKMBINDEX, TROPONINI in the last 168 hours.  BNP (last 3 results) No results for input(s): BNP in the last 8760 hours.  ProBNP (last 3 results) No results for input(s): PROBNP in the last 8760 hours.   CREATININE: 8.41 mg/dL ABNORMAL (16/10/96 0454) Estimated creatinine clearance - 8.7 mL/min  CBG:  Recent Labs Lab 04/16/15 1208 04/16/15 2150 04/17/15 0758 04/17/15 1301 04/20/15 1041  GLUCAP 173* 136* 117* 146* 101*    Radiological Exams on Admission: Ct Head Wo Contrast  04/20/2015  CLINICAL DATA:  Weakness. Speech difficulty. Slurred speech. Headache appear EXAM: CT HEAD WITHOUT CONTRAST TECHNIQUE: Contiguous axial images were obtained from the base of the skull through the vertex without intravenous contrast. COMPARISON:  04/04/2015 FINDINGS:  Global atrophy. Chronic ischemic changes in the periventricular white matter. No mass effect, midline shift, or acute hemorrhage. Calcifications of the left lobe with slight shrinking suggest early phthisis bulbi. Visualized paranasal sinuses and mastoid air cells are clear. Cranium is intact. IMPRESSION: No acute intracranial pathology. Electronically Signed   By: Jolaine Click M.D.   On: 04/20/2015 11:42   Dg Chest Port 1 View  04/20/2015  CLINICAL DATA:  Weakness for 5 days.  Episodes of hypoxia EXAM: PORTABLE CHEST 1 VIEW COMPARISON:  April 13, 2015 FINDINGS: There is increased airspace consolidation in portions of the right upper and right lower lobes. There is underlying interstitial edema with cardiomegaly and pulmonary venous hypertension. No adenopathy evident. No bone lesions. IMPRESSION: Persistent underlying congestive heart failure. New areas of consolidation throughout portions the right upper and lower lobes. Question alveolar edema versus superimposed pneumonia. Both entities may exist concurrently. Electronically Signed   By: Bretta Bang III M.D.   On: 04/20/2015 10:35    Assessment/Plan Active Problems:   Blind left eye   ESRD (end stage renal disease) (HCC)   HCAP (healthcare-associated pneumonia)   PAF (paroxysmal atrial fibrillation) (HCC)   Encephalopathy   Physical deconditioning   Diabetes mellitus with complication (HCC)   HCAP: Patient admitted on 04/12/2015 for HCAP, and finished last 2 days of antibiotic regimen at home after discharge on 04/17/2015. Unable to fully ascertain if patient after he took his medications as family and patient very poor historians. Chest x-ray today concerning for new areas of consolidation in the upper lobes. - MedSurg - Vancomycin and cefepime - Pneumonia order set utilized - ABG - O2 when necessary  Persistent encephalopathy: Suspect this is related to ongoing HCAP at appears to be worsening. Of note patient admitted from  04/12/2015 until 04/17/2015 for treatment of the same. Per family patients mental status has never returned to baseline since previous admission. Suspect some element of underlying dementia as the  symptoms seem to wax and wane per family. Lactic acid 1.23 - Treatment as above - MMSE after discharge - Ammonia, UDS  Physical deconditioning: Persistent for several weeks. Suspect patient will need nursing home placement. - PT/OT/nutrition consult - Social worker consult  DM: diet controlled but per report pt occasionally gets family members oral and injectable medications. - SSI  PAF: currently rate controlled. EKG in sinus. Concern for CHF on plain film. Last Echo w/ EF 55% and mild ventricular hypertrophy in 2013. CHA2DS2-VASc = 4  - Echo - continue amio - continue warfarin  ESRD: Patient on dialysis. Mother was a Friday. Discussed with Dr. Arlean Hopping who will follow and dialyze patient as needed.  - dialysis per nephro - Renal vitamins - Suspect pt may be slightly fluid overloaded and will need net diuresis w/ next dialysis  HLD: - continue statin  Code Status: DNI  DVT Prophylaxis: coumadin Family Communication: wife Disposition Plan: Pending Improvement    Anahis Furgeson J, MD Family Medicine [facility] [email or web address] Password [email or web address]

## 2015-04-20 NOTE — ED Notes (Signed)
Patient here by EMS from home with complaint of general weakness for 5 days. Had dialysis yesterday and arrived with IV in place by EMS. Denies pain, persistent cough, recent admission for pneumonia

## 2015-04-20 NOTE — ED Provider Notes (Signed)
CSN: [id number]     Arrival date & time 04/20/15  0935 History   First MD Initiated Contact with Patient 04/20/15 [phone]     Chief Complaint  Patient presents with  . Weakness     (Consider location/radiation/quality/duration/timing/severity/associated sxs/prior Treatment) The history is provided by the patient and medical records.     Pt with hx ESRD on MWF dialysis, last dialysis yesterday, DM, afib on coumadin, recent admission 04/12/15-04/17/15 for HCAP and respiratory failure with hypoxia, presents with worsening generalized weakness, SOB, and cough.  HCAP was treated with vanc, cefepime, and augmentin.  Pt reports he finished the antibiotics.  Denies any pain.    Level V caveat - communication is very difficult as pt is either too weak to enunciate or is slurring words for some other reason, also answers questions only occasionally and with often indistinct responses.    Hypoxic upon arrival to 88% room air.  Pt unable to assist with changing clothes due to weakness, per nurse.    Past Medical History  Diagnosis Date  . Diabetes mellitus without complication (HCC)   . Atrial fibrillation (HCC)   . Hypertension   . Glaucoma   . Peripheral neuropathy (HCC)   . Gout   . Anemia   . ESRD (end stage renal disease) on dialysis [facility])     "MWF; [address]" (04/08/2015)   Past Surgical History  Procedure Laterality Date  . Ablation of dysrhythmic focus    . Sp av dialysis shunt access existing *l*     Family History  Problem Relation Age of Onset  . Alcoholism Brother   . Alcoholism Brother   . Other Mother     bowel obstruction  . Heart failure Father     fluid bluid up   Social History  Substance Use Topics  . Smoking status: Never Smoker   . Smokeless tobacco: Never Used  . Alcohol Use: No    Review of Systems  Unable to perform ROS: Acuity of condition      Allergies  Diltiazem  Home Medications   Prior to Admission medications   Medication Sig  Start Date End Date Taking? Authorizing Provider  acetaminophen (TYLENOL) 325 MG tablet Take 650 mg by mouth daily as needed for mild pain.    Historical Provider, MD  albuterol (PROVENTIL HFA;VENTOLIN HFA) 108 (90 Base) MCG/ACT inhaler Inhale 1-2 puffs into the lungs every 6 (six) hours as needed for wheezing or shortness of breath.  03/29/15 03/28/16  Historical Provider, MD  amiodarone (PACERONE) 200 MG tablet Take 1 tablet (200 mg total) by mouth daily. 04/23/15   Belkys A Regalado, MD  amiodarone (PACERONE) 400 MG tablet Take 1 tablet (400 mg total) by mouth daily. 04/17/15   Belkys A Regalado, MD  amLODipine (NORVASC) 10 MG tablet Take 10 mg by mouth at bedtime.  01/09/14   Historical Provider, MD  aspirin EC 81 MG tablet Take 81 mg by mouth daily.    Historical Provider, MD  atropine 1 % ophthalmic solution Place 1 drop into the left eye 2 times daily at 12 noon and 4 pm.    Historical Provider, MD  Calcium Acetate 667 MG TABS Take 1-2 capsules by mouth 3 (three) times daily. 667 mg for snacks, 1334 mg for meals    Historical Provider, MD  docusate sodium (COLACE) 100 MG capsule Take 100 mg by mouth 2 (two) times daily.    Historical Provider, MD  dorzolamide-timolol (COSOPT) 22.3-6.8 MG/ML ophthalmic  solution Place 1 drop into the right eye 2 (two) times daily.     Historical Provider, MD  folic acid-vitamin b complex-vitamin c-selenium-zinc (DIALYVITE) 3 MG TABS tablet Take 1 tablet by mouth daily.    Historical Provider, MD  lidocaine-prilocaine (EMLA) cream Apply 1 application topically as needed (apply to skin near dialysis port before dialysis).  08/31/14   Historical Provider, MD  Multiple Vitamin (MULTIVITAMIN WITH MINERALS) TABS tablet Take 1 tablet by mouth daily. 04/17/15   Belkys A Regalado, MD  prednisoLONE acetate (PRED FORTE) 1 % ophthalmic suspension Place 1 drop into the left eye 2 (two) times daily. Patient to use for 37 days. Filled on 03-28-15    Historical Provider, MD  SENSIPAR 60  MG tablet Take 60 mg by mouth at bedtime.  09/03/14   Historical Provider, MD  simvastatin (ZOCOR) 20 MG tablet Take 20 mg by mouth every evening.    Historical Provider, MD  tobramycin-dexamethasone [facility](TOBRADEX) ophthalmic solution Place 1 drop into the left eye 4 (four) times daily. For 37 days. Filled 03-31-15    Historical Provider, MD  TRAVATAN Z 0.004 % SOLN ophthalmic solution Place 1 drop into the right eye at bedtime.  08/30/14   Historical Provider, MD  valsartan (DIOVAN) 160 MG tablet Take 160 mg by mouth at bedtime.     Historical Provider, MD  warfarin (COUMADIN) 5 MG tablet Take 7.5 mg daily, except on M, W, F . On M, W, F take 5 mg by mouth. 04/17/15   Belkys A Regalado, MD   BP 117/95 mmHg  Pulse 76  Temp(Src) 98.4 F (36.9 C) (Oral)  Resp 18  SpO2 91% Physical Exam  Constitutional: He appears well-developed and well-nourished. No distress.  HENT:  Head: Normocephalic and atraumatic.  Eyes: Left eye exhibits discharge.  Dry mucus membranes   Neck: Neck supple.  Cardiovascular: Normal rate and regular rhythm.   Pulmonary/Chest: Effort normal and breath sounds normal. No respiratory distress. He has no wheezes. He has no rales.  Abdominal: Soft. He exhibits no distension and no mass. There is no tenderness. There is no rebound and no guarding.  Neurological: He is alert. He exhibits normal muscle tone.  Moves all extremities equally, generally weak.   Skin: No rash noted. He is not diaphoretic.  Nursing note and vitals reviewed.   ED Course  Procedures (including critical care time) Labs Review Labs Reviewed  BASIC METABOLIC PANEL - Abnormal; Notable for the following:    Chloride 96 (*)    Glucose, Bld 109 (*)    BUN 36 (*)    Creatinine, Ser 8.41 (*)    GFR calc non Af Amer 5 (*)    GFR calc Af Amer 6 (*)    Anion gap 17 (*)    All other components within normal limits  CBC - Abnormal; Notable for the following:    RBC 3.03 (*)    Hemoglobin 9.3 (*)    HCT 27.9  (*)    RDW 15.8 (*)    All other components within normal limits  PROTIME-INR - Abnormal; Notable for the following:    Prothrombin Time 29.1 (*)    INR 2.81 (*)    All other components within normal limits  CBG MONITORING, ED - Abnormal; Notable for the following:    Glucose-Capillary 101 (*)    All other components within normal limits  CULTURE, BLOOD (ROUTINE X 2)  CULTURE, BLOOD (ROUTINE X 2)  I-STAT CG4 LACTIC ACID, ED  Imaging Review Dg Chest Port 1 View  04/20/2015  CLINICAL DATA:  Weakness for 5 days.  Episodes of hypoxia EXAM: PORTABLE CHEST 1 VIEW COMPARISON:  April 13, 2015 FINDINGS: There is increased airspace consolidation in portions of the right upper and right lower lobes. There is underlying interstitial edema with cardiomegaly and pulmonary venous hypertension. No adenopathy evident. No bone lesions. IMPRESSION: Persistent underlying congestive heart failure. New areas of consolidation throughout portions the right upper and lower lobes. Question alveolar edema versus superimposed pneumonia. Both entities may exist concurrently. Electronically Signed   By: Bretta Bang III M.D.   On: 04/20/2015 10:35   I have personally reviewed and evaluated these images and lab results as part of my medical decision-making.   EKG Interpretation None      MDM   Final diagnoses:  Hypoxia  HCAP (healthcare-associated pneumonia)    Patient with hx ESRD on dialysis, recent admission for HCAP p/w worsening weakness, SOB, cough hypoxic on room air, generalized weakness.  CXR shows new infiltrates vs edema.  Labs approximately at baseline.  INR 2.8.  Discussed pt with Dr Ranae Palms who also saw patient and discussed pt with Dr Konrad Dolores ([facility]) who agreed to admission.    Trixie Dredge, PA-C 04/20/15 1458  Loren Racer, MD 04/22/15 [phone]

## 2015-04-20 NOTE — ED Notes (Signed)
Notified Dr. Konrad DoloresMerrell of pt's low BG. Awaiting further orders.

## 2015-04-20 NOTE — ED Notes (Signed)
Rena MD at bedside for assessment.

## 2015-04-21 ENCOUNTER — Inpatient Hospital Stay (HOSPITAL_COMMUNITY): Payer: Medicare Other

## 2015-04-21 DIAGNOSIS — N186 End stage renal disease: Secondary | ICD-10-CM

## 2015-04-21 DIAGNOSIS — G9340 Encephalopathy, unspecified: Secondary | ICD-10-CM

## 2015-04-21 DIAGNOSIS — I480 Paroxysmal atrial fibrillation: Secondary | ICD-10-CM

## 2015-04-21 DIAGNOSIS — J189 Pneumonia, unspecified organism: Principal | ICD-10-CM

## 2015-04-21 DIAGNOSIS — R5381 Other malaise: Secondary | ICD-10-CM

## 2015-04-21 DIAGNOSIS — H5442 Blindness, left eye, normal vision right eye: Secondary | ICD-10-CM

## 2015-04-21 LAB — COMPREHENSIVE METABOLIC PANEL
ALT: 22 U/L (ref 17–63)
AST: 22 U/L (ref 15–41)
Albumin: 2.3 g/dL — ABNORMAL LOW (ref 3.5–5.0)
Alkaline Phosphatase: 50 U/L (ref 38–126)
Anion gap: 14 (ref 5–15)
BILIRUBIN TOTAL: 0.7 mg/dL (ref 0.3–1.2)
BUN: 20 mg/dL (ref 6–20)
CO2: 27 mmol/L (ref 22–32)
CREATININE: 6.06 mg/dL — AB (ref 0.61–1.24)
Calcium: 9.2 mg/dL (ref 8.9–10.3)
Chloride: 97 mmol/L — ABNORMAL LOW (ref 101–111)
GFR calc Af Amer: 9 mL/min — ABNORMAL LOW (ref >60)
GFR, EST NON AFRICAN AMERICAN: 8 mL/min — AB (ref >60)
Glucose, Bld: 100 mg/dL — ABNORMAL HIGH (ref 65–99)
Potassium: 4.4 mmol/L (ref 3.5–5.1)
Sodium: 138 mmol/L (ref 135–145)
TOTAL PROTEIN: 7 g/dL (ref 6.5–8.1)

## 2015-04-21 LAB — GLUCOSE, CAPILLARY
GLUCOSE-CAPILLARY: 101 mg/dL — AB (ref 65–99)
GLUCOSE-CAPILLARY: 57 mg/dL — AB (ref 65–99)
GLUCOSE-CAPILLARY: 67 mg/dL (ref 65–99)
GLUCOSE-CAPILLARY: 87 mg/dL (ref 65–99)
GLUCOSE-CAPILLARY: 97 mg/dL (ref 65–99)
Glucose-Capillary: 50 mg/dL — ABNORMAL LOW (ref 65–99)
Glucose-Capillary: 218 mg/dL — ABNORMAL HIGH (ref 65–99)

## 2015-04-21 LAB — CBC
HEMATOCRIT: 23.6 % — AB (ref 39.0–52.0)
Hemoglobin: 7.7 g/dL — ABNORMAL LOW (ref 13.0–17.0)
MCH: 30.6 pg (ref 26.0–34.0)
MCHC: 32.6 g/dL (ref 30.0–36.0)
MCV: 93.7 fL (ref 78.0–100.0)
Platelets: 235 10*3/uL (ref 150–400)
RBC: 2.52 MIL/uL — AB (ref 4.22–5.81)
RDW: 15.7 % — AB (ref 11.5–15.5)
WBC: 6.3 10*3/uL (ref 4.0–10.5)

## 2015-04-21 LAB — MRSA PCR SCREENING: MRSA by PCR: NEGATIVE

## 2015-04-21 LAB — PROTIME-INR
INR: 2.55 — ABNORMAL HIGH (ref 0.00–1.49)
PROTHROMBIN TIME: 27.1 s — AB (ref 11.6–15.2)

## 2015-04-21 LAB — HIV ANTIBODY (ROUTINE TESTING W REFLEX): HIV Screen 4th Generation wRfx: NONREACTIVE

## 2015-04-21 MED ORDER — LIDOCAINE HCL (PF) 1 % IJ SOLN: 5.0000 mL | INTRAMUSCULAR | Status: DC | PRN

## 2015-04-21 MED ORDER — DEXTROSE 50 % IV SOLN
INTRAVENOUS | Status: AC
  Administered 2015-04-21: 25 mL
  Filled 2015-04-21: qty 50

## 2015-04-21 MED ORDER — LIDOCAINE-PRILOCAINE 2.5-2.5 % EX CREA: 1.0000 "application " | TOPICAL_CREAM | CUTANEOUS | Status: DC | PRN

## 2015-04-21 MED ORDER — DARBEPOETIN ALFA 60 MCG/0.3ML IJ SOSY
PREFILLED_SYRINGE | INTRAMUSCULAR | Status: AC
  Filled 2015-04-21: qty 0.3

## 2015-04-21 MED ORDER — LORAZEPAM 2 MG/ML IJ SOLN
1.0000 mg | Freq: Once | INTRAMUSCULAR | Status: AC
  Administered 2015-04-21: 1 mg via INTRAVENOUS
  Filled 2015-04-21: qty 1

## 2015-04-21 MED ORDER — VANCOMYCIN HCL IN DEXTROSE 1-5 GM/200ML-% IV SOLN
INTRAVENOUS | Status: AC
  Filled 2015-04-21: qty 200

## 2015-04-21 MED ORDER — HEPARIN SODIUM (PORCINE) 1000 UNIT/ML DIALYSIS: 1000.0000 [IU] | INTRAMUSCULAR | Status: DC | PRN

## 2015-04-21 MED ORDER — WARFARIN SODIUM 7.5 MG PO TABS
7.5000 mg | ORAL_TABLET | Freq: Once | ORAL | Status: AC
  Administered 2015-04-21: 7.5 mg via ORAL
  Filled 2015-04-21 (×2): qty 1

## 2015-04-21 MED ORDER — PENTAFLUOROPROP-TETRAFLUOROETH EX AERO: 1.0000 "application " | INHALATION_SPRAY | CUTANEOUS | Status: DC | PRN

## 2015-04-21 MED ORDER — SODIUM CHLORIDE 0.9 % IV SOLN: 100.0000 mL | INTRAVENOUS | Status: DC | PRN

## 2015-04-21 MED ORDER — CALCIUM ACETATE (PHOS BINDER) 667 MG PO CAPS: 667.0000 mg | ORAL_CAPSULE | ORAL | Status: DC | PRN

## 2015-04-21 MED ORDER — HEPARIN SODIUM (PORCINE) 1000 UNIT/ML DIALYSIS: 4000.0000 [IU] | Freq: Once | INTRAMUSCULAR | Status: DC

## 2015-04-21 MED ORDER — HEPARIN SODIUM (PORCINE) 1000 UNIT/ML DIALYSIS: 3000.0000 [IU] | Freq: Once | INTRAMUSCULAR | Status: DC

## 2015-04-21 MED ORDER — ALTEPLASE 2 MG IJ SOLR: 2.0000 mg | Freq: Once | INTRAMUSCULAR | Status: DC | PRN

## 2015-04-21 NOTE — Progress Notes (Signed)
Hypoglycemic Event  CBG: 67  Treatment: carb snack  Symptoms: none  Follow-up CBG: Time: 9:19 CBG Result: 87  Possible Reasons for Event: unknown  Comments/MD notified: yes    [name] [name]

## 2015-04-21 NOTE — Progress Notes (Signed)
Received from Hemodialysis via stretcher. Wife with patient. Assessment as charted.
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
## 2015-04-21 NOTE — Evaluation (Signed)
Physical Therapy Evaluation Patient Details Name: [name] [name] MRN: [id number] DOB: [birth year] Today's Date: 04/21/2015   History of Present Illness  Pt is 82 y/o male w/ PMH: diabetes, A-fib on coumadin, HTN, end-stage renal disease, generalized wekness and inability to ambulate per chart review. Patient was discharged from [facility] on 04/17/2015 after admission for acute respiratory failure secondary to HcAP and generalized weakness. Per patient's wife he has been very weak and altered in his mentation for greater than 10 days. She was unable to state any change in his physical or mental status since discharge on 04/17/2015. Patient received his full dialysis session on 04/19/2015 4 hours. Family states medical compliance with medications. Pt was admitted dx: Paroxysmal atrial fibrillation.  Clinical Impression  Pt admitted with above complications. Pt currently with functional limitations due to the deficits listed below (see PT Problem List). Lethargic, providing a poor history at times but states he was ambulatory for very short distances at home PTA and that his wife can only provide minimal assistance at home. Pt required +2 mod assist for bed mobility and transfer today.  Pt will benefit from skilled PT to increase their independence and safety with mobility to allow discharge to the venue listed below.       Follow Up Recommendations Supervision/Assistance - 24 hour;SNF    Equipment Recommendations  None recommended by PT    Recommendations for Other Services       Precautions / Restrictions Precautions Precautions: Fall Precaution Comments: monitor O2      Mobility  Bed Mobility Overal bed mobility: Needs Assistance Bed Mobility: Supine to Sit     Supine to sit: Mod assist;+2 for physical assistance;HOB elevated Sit to supine: Mod assist;+2 for physical assistance   General bed mobility comments: Mod assist for truncal support. Pt able to pull through PTs  hand. VC for technique. Assist for LEs out of bed and to initiate sequencing.  Transfers Overall transfer level: Needs assistance Equipment used: Rolling walker (2 wheeled) Transfers: Sit to/from [employer] Sit to Stand: Mod assist;From elevated surface;+2 physical assistance   Squat pivot transfers: Mod assist;+2 physical assistance     General transfer comment: Mod assist +2 for boost to stand. Cues for hand placement on RW once standing but did not tolerate >1 minute and could not fully extend hips for upright posture. Performed squat pivot transfer with Mod assist for sequencing and boost. Attempts to sit prematurely. Once in recliner, pt able to lift bottom to scoot to center and backwards.  Ambulation/Gait                Stairs            Wheelchair Mobility    Modified Rankin (Stroke Patients Only)       Balance Overall balance assessment: Needs assistance Sitting-balance support: Single extremity supported;Feet supported Sitting balance-Leahy Scale: Poor Sitting balance - Comments: Sat EOB with frequent LOB to posterior, able to self correct with max VC and tactile facilitation. Postural control: Posterior lean Standing balance support: Bilateral upper extremity supported Standing balance-Leahy Scale: Poor                               Pertinent Vitals/Pain Pain Assessment:  ("all over" - no value or discription given)    Home Living Family/patient expects to be discharged to:: Unsure Living Arrangements: Spouse/significant other Available Help at Discharge: Family;Available 24 hours/day;Other (Comment) ("She  will help me if she will, but she's not in good health") Type of Home: House Home Access: Stairs to enter   Entrance Stairs-Number of Steps: 2 Home Layout: One level Home Equipment: Cane - single point;Walker - 2 wheels Additional Comments: Zenaida Niecevan comes to get pt for dialysis, pt reports up until ~3 weeks ago pt was  able to walk short distances with RW     Prior Function Level of Independence: Needs assistance   Gait / Transfers Assistance Needed: Uses RW  ADL's / Homemaking Assistance Needed: Pt reports that he requires assistance "for bathing, dressing and all that"  Comments: No family present to assist with PLOF since last admission/discharge on 04/17/15.     Hand Dominance   Dominant Hand: Right    Extremity/Trunk Assessment   Upper Extremity Assessment: Defer to OT evaluation           Lower Extremity Assessment: Difficult to assess due to impaired cognition         Communication   Communication: Other (comment) (Slurred/garbled speech at baseline per pt report)  Cognition Arousal/Alertness: Lethargic Behavior During Therapy: WFL for tasks assessed/performed Overall Cognitive Status: No family/caregiver present to determine baseline cognitive functioning Area of Impairment: Memory;Safety/judgement;Orientation;Attention Orientation Level: Place;Time;Situation Current Attention Level: Focused     Safety/Judgement: Decreased awareness of safety;Decreased awareness of deficits          General Comments General comments (skin integrity, edema, etc.): Sats 84% on room air when PT entered room. Improved to 94% with 2L supplemental O2. More interactive once upright     Exercises General Exercises - Lower Extremity Long Arc Quad: AAROM;Strengthening;Both;5 reps;Seated Hip Flexion/Marching: Strengthening;Both;10 reps;Seated      Assessment/Plan    PT Assessment Patient needs continued PT services  PT Diagnosis Difficulty walking;Generalized weakness;Acute pain;Altered mental status   PT Problem List Decreased strength;Decreased range of motion;Decreased activity tolerance;Decreased balance;Decreased mobility;Decreased safety awareness;Decreased cognition;Decreased knowledge of use of DME;Cardiopulmonary status limiting activity;Pain  PT Treatment Interventions DME  instruction;Gait training;Functional mobility training;Therapeutic activities;Therapeutic exercise;Balance training;Cognitive remediation;Patient/family education   PT Goals (Current goals can be found in the Care Plan section) Acute Rehab PT Goals Patient Stated Goal: None stated PT Goal Formulation: Patient unable to participate in goal setting Time For Goal Achievement: 05/05/15 Potential to Achieve Goals: Fair    Frequency Min 2X/week   Barriers to discharge Decreased caregiver support Limited physical assistance    Co-evaluation               End of Session Equipment Utilized During Treatment: Gait belt;Oxygen Activity Tolerance: Patient limited by lethargy Patient left: in chair;with call bell/phone within reach;with chair alarm set Nurse Communication:  (Could not reach via telephone)         Time: [id number] PT Time Calculation (min) (ACUTE ONLY): 20 min   Charges:   PT Evaluation $PT Eval Moderate Complexity: 1 Procedure     PT G CodesBerton Mount:        Janelli Welling S 04/21/2015, 11:29 AM Charlsie MerlesLogan Secor Modene Andy, PT [phone]

## 2015-04-21 NOTE — Progress Notes (Signed)
Hypoglycemic Event  CBG: 57  Treatment: carb snack  Symptoms: none  Follow-up CBG: Time:8:53 CBG Result: 67  Possible Reasons for Event: unknown  Comments/MD notified: yes    [name] [name]

## 2015-04-21 NOTE — Progress Notes (Signed)
PATIENT DETAILS Name: [name] [name] Age: 82 y.o. Sex: male Date of Birth: 02-21-1933 Admit Date: 04/20/2015 Admitting Physician Briscoe Deutscher, MD [id number] D, MD  Subjective: Still very weak, shortness of breath mildly better. .  Assessment/Plan: Active Problems: Acute hypoxemic respiratory failure: Suspect from noncardiogenic pulmonary edema in a setting of hemodialysis. Doubt pneumonia. Chest x-ray with only minimal improvement post dialysis, plans are to repeat dialysis today and repeat chest x-ray tomorrow. If x-ray shows significant improvement, we will discontinue all antibiotics.  HCAP vs Pul Edema:Suspect noncardiogenic pulmonary edema-in a setting of dialysis rather than pneumonia. For now continue empiric antibiotics, volume removal with hemodialysis. Check echo and Follow.   Acute encephalopathy:  Seems to have improved, too lethargic-but answering questions appropriately this morning.  ESRD: On hemodialysis MWF-see above  Atrial fibrillation: Sinus rhythm, continue amiodarone, Coumadin per pharmacy. INR therapeutic.  Type 2 diabetes: Not on any oral hypoglycemic agents, CBGs on the lower side-stop SSI. Follow.  Hypertension: Controlled with amlodipine. Follow  Anemia: Likely secondary to end-stage renal disease, no indication of blood loss. Follow for now. May need transfusion, if hemoglobin continues to drop.  Physical deconditioning: Likely chronic issue, made worse by acute illness. PT evaluation.   Blind left eye  Disposition: Remain inpatient  Antimicrobial agents  See below  Anti-infectives    Start     Dose/Rate Route Frequency Ordered Stop   04/21/15 1800  ceFEPIme (MAXIPIME) 2 g in dextrose 5 % 50 mL IVPB     2 g 100 mL/hr over 30 Minutes Intravenous Every M-W-F (1800) 04/20/15 1214     04/21/15 1439  vancomycin (VANCOCIN) 1 GM/200ML IVPB    Comments:  Woods, Angene   : cabinet override      04/21/15 1439 04/21/15 1628     04/21/15 1200  vancomycin (VANCOCIN) IVPB 1000 mg/200 mL premix     1,000 mg 200 mL/hr over 60 Minutes Intravenous Every M-W-F (Hemodialysis) 04/20/15 1214     04/20/15 1215  vancomycin (VANCOCIN) 2,000 mg in sodium chloride 0.9 % 500 mL IVPB     2,000 mg 250 mL/hr over 120 Minutes Intravenous  Once 04/20/15 1214 04/20/15 1551   04/20/15 1215  ceFEPIme (MAXIPIME) 2 g in dextrose 5 % 50 mL IVPB     2 g 100 mL/hr over 30 Minutes Intravenous  Once 04/20/15 1214 04/20/15 1254      DVT Prophylaxis: Coumadin  Code Status: DNI  Family Communication None at bedside  Procedures: None  CONSULTS:  nephrology  Time spent 30 minutes-Greater than 50% of this time was spent in counseling, explanation of diagnosis, planning of further management, and coordination of care.  MEDICATIONS: Scheduled Meds: . amiodarone  400 mg Oral Daily  . amLODipine  10 mg Oral QHS  . aspirin EC  81 mg Oral Daily  . atropine  1 drop Left Eye q12n4p  . calcium acetate  1,334 mg Oral TID WC  . ceFEPime (MAXIPIME) IV  2 g Intravenous Q M,W,F-1800  . cinacalcet  60 mg Oral QHS  . darbepoetin (ARANESP) injection - DIALYSIS  60 mcg Intravenous Q Wed-HD  . dorzolamide-timolol  1 drop Right Eye BID  . heparin  3,000 Units Dialysis Once in dialysis  . [START ON 04/22/2015] heparin  4,000 Units Dialysis Once in dialysis  . insulin aspart  0-5 Units Subcutaneous QHS  . insulin aspart  0-9 Units Subcutaneous TID  WC  . latanoprost  1 drop Right Eye QHS  . prednisoLONE acetate  1 drop Left Eye BID  . simvastatin  20 mg Oral QPM  . sodium chloride flush  3 mL Intravenous Q12H  . tobramycin-dexamethasone  1 drop Left Eye QID  . vancomycin  1,000 mg Intravenous Q M,W,F-HD  . warfarin  7.5 mg Oral ONCE-1800  . Warfarin - Pharmacist Dosing Inpatient   Does not apply q1800   Continuous Infusions: . dextrose 5 % and 0.9 % NaCl with KCl 40 mEq/L 10 mL/hr at 04/21/15 0100   PRN Meds:.sodium chloride, sodium  chloride, sodium chloride, sodium chloride, sodium chloride, sodium chloride, alteplase, alteplase, alteplase, calcium acetate, heparin, heparin, heparin, lidocaine (PF), lidocaine (PF), lidocaine (PF), lidocaine-prilocaine, lidocaine-prilocaine, lidocaine-prilocaine, lidocaine-prilocaine, ondansetron **OR** ondansetron (ZOFRAN) IV, pentafluoroprop-tetrafluoroeth, pentafluoroprop-tetrafluoroeth, pentafluoroprop-tetrafluoroeth    PHYSICAL EXAM: Vital signs in last 24 hours: Filed Vitals:   04/21/15 1530 04/21/15 1600 04/21/15 1630 04/21/15 1700  BP: 127/66 128/71 105/60 105/64  Pulse: 62 60 62 62  Temp:      TempSrc:      Resp: 17 17 20 21   Height:      Weight:      SpO2:        Weight change:  Filed Weights   04/20/15 2331 04/21/15 0500 04/21/15 1343  Weight: 99.1 kg (218 lb 7.6 oz) 99.9 kg (220 lb 3.8 oz) 101 kg (222 lb 10.6 oz)   Body mass index is 27.11 kg/(m^2).   Gen Exam: Awake-lethargic, but answering questions appropriately this morning.  Neck: Supple,  Chest:Few bibasilar rales CVS: S1 S2 irregular Abdomen: soft, BS +, non tender, non distended.  Extremities: no edema, lower extremities warm to touch. Neurologic: Non Focal-but with generalized weakness Skin: No Rash.   Wounds: N/A.    Intake/Output from previous day:  Intake/Output Summary (Last 24 hours) at 04/21/15 1723 Last data filed at 04/21/15 0900  Gross per 24 hour  Intake    300 ml  Output   3000 ml  Net  -2700 ml     LAB RESULTS: CBC  Recent Labs Lab 04/16/15 0822 04/20/15 1044 04/21/15 0650  WBC 8.1 7.4 6.3  HGB 8.1* 9.3* 7.7*  HCT 24.9* 27.9* 23.6*  PLT 284 268 235  MCV 91.5 92.1 93.7  MCH 29.8 30.7 30.6  MCHC 32.5 33.3 32.6  RDW 15.2 15.8* 15.7*    Chemistries   Recent Labs Lab 04/16/15 0822 04/17/15 0751 04/20/15 1044 04/21/15 0650  NA 131*  --  139 138  K 4.4  --  4.1 4.4  CL 92*  --  96* 97*  CO2 24  --  26 27  GLUCOSE 142*  --  109* 100*  BUN 42*  --  36* 20   CREATININE 9.22*  --  8.41* 6.06*  CALCIUM 9.6  --  10.0 9.2  MG  --  2.1  --   --     CBG:  Recent Labs Lab 04/21/15 0549 04/21/15 0825 04/21/15 0852 04/21/15 0919 04/21/15 1709  GLUCAP 101* 57* 67 87 97    GFR Estimated Creatinine Clearance: 12.1 mL/min (by C-G formula based on Cr of 6.06).  Coagulation profile  Recent Labs Lab 04/15/15 0350 04/16/15 0822 04/17/15 0421 04/20/15 1044 04/21/15 0650  INR 2.82* 2.01* 2.18* 2.81* 2.55*    Cardiac Enzymes No results for input(s): CKMB, TROPONINI, MYOGLOBIN in the last 168 hours.  Invalid input(s): CK  Invalid input(s): POCBNP No results for input(s): DDIMER in  the last 72 hours. No results for input(s): HGBA1C in the last 72 hours. No results for input(s): CHOL, HDL, LDLCALC, TRIG, CHOLHDL, LDLDIRECT in the last 72 hours. No results for input(s): TSH, T4TOTAL, T3FREE, THYROIDAB in the last 72 hours.  Invalid input(s): FREET3 No results for input(s): VITAMINB12, FOLATE, FERRITIN, TIBC, IRON, RETICCTPCT in the last 72 hours. No results for input(s): LIPASE, AMYLASE in the last 72 hours.  Urine Studies No results for input(s): UHGB, CRYS in the last 72 hours.  Invalid input(s): UACOL, UAPR, USPG, UPH, UTP, UGL, UKET, UBIL, UNIT, UROB, ULEU, UEPI, UWBC, URBC, UBAC, CAST, UCOM, BILUA  MICROBIOLOGY: Recent Results (from the past 240 hour(s))  Culture, blood (routine x 2) Call MD if unable to obtain prior to antibiotics being given     Status: None   Collection Time: 04/12/15 12:12 PM  Result Value Ref Range Status   Specimen Description BLOOD RIGHT HAND  Final   Special Requests IN PEDIATRIC BOTTLE 1CC  Final   Culture NO GROWTH 5 DAYS  Final   Report Status 04/17/2015 FINAL  Final  Culture, blood (routine x 2) Call MD if unable to obtain prior to antibiotics being given     Status: None   Collection Time: 04/12/15  1:14 PM  Result Value Ref Range Status   Specimen Description BLOOD RIGHT HAND  Final   Special  Requests IN PEDIATRIC BOTTLE 2CC  Final   Culture NO GROWTH 5 DAYS  Final   Report Status 04/17/2015 FINAL  Final  Gram stain     Status: None   Collection Time: 04/12/15  1:55 PM  Result Value Ref Range Status   Specimen Description TRACHEAL SITE  Final   Special Requests NONE  Final   Gram Stain   Final    FEW WBC PRESENT,BOTH PMN AND MONONUCLEAR MODERATE GRAM POSITIVE RODS FEW GRAM POSITIVE COCCI IN PAIRS FEW GRAM NEGATIVE RODS SQUAMOUS EPITHELIAL CELLS PRESENT    Report Status 04/12/2015 FINAL  Final  Culture, respiratory (NON-Expectorated)     Status: None   Collection Time: 04/12/15  1:55 PM  Result Value Ref Range Status   Specimen Description SPUTUM  Final   Special Requests NONE  Final   Gram Stain   Final    FEW WBC PRESENT,BOTH PMN AND MONONUCLEAR MODERATE SQUAMOUS EPITHELIAL CELLS PRESENT MODERATE GRAM POSITIVE RODS FEW GRAM POSITIVE COCCI IN PAIRS FEW GRAM NEGATIVE RODS Performed at [facility] Performed at [facility]    Culture   Final    NORMAL OROPHARYNGEAL FLORA Performed at [facility]    Report Status 04/15/2015 FINAL  Final  MRSA PCR Screening     Status: None   Collection Time: 04/14/15  7:10 PM  Result Value Ref Range Status   MRSA by PCR NEGATIVE NEGATIVE Final    Comment:        The GeneXpert MRSA Assay (FDA approved for NASAL specimens only), is one component of a comprehensive MRSA colonization surveillance program. It is not intended to diagnose MRSA infection nor to guide or monitor treatment for MRSA infections.   MRSA PCR Screening     Status: None   Collection Time: 04/21/15  3:51 AM  Result Value Ref Range Status   MRSA by PCR NEGATIVE NEGATIVE Final    Comment:        The GeneXpert MRSA Assay (FDA approved for NASAL specimens only), is one component of a comprehensive MRSA colonization surveillance program. It is not  intended to diagnose MRSA infection nor to guide or monitor treatment for MRSA  infections.     RADIOLOGY STUDIES/RESULTS: Dg Chest 1 View  04/13/2015  CLINICAL DATA:  I50.9 (ICD-10-CM) - CHF (congestive heart failure) (HCC) J18.9 (ICD-10-CM) - PNA (pneumonia) cough EXAM: CHEST  1 VIEW COMPARISON:  the previous day's study FINDINGS: Asymmetric interstitial and alveolar opacities most prominent in the right upper lobe as before. Heart size upper limits normal for technique. No definite effusion. No pneumothorax. Visualized skeletal structures are unremarkable. IMPRESSION: 1. Little change in asymmetric infiltrates or edema. Electronically Signed   By: Corlis Leak M.D.   On: 04/13/2015 13:05   Dg Chest 2 View  04/12/2015  CLINICAL DATA:  Shortness of breath and tachycardia EXAM: CHEST  2 VIEW COMPARISON:  July 10, 2014 FINDINGS: There is patchy airspace consolidation throughout much of the right lung, primarily in the right upper lobe anteriorly. Left lung is clear. Heart is upper normal in size with pulmonary vascularity within normal limits. No adenopathy. There are no apparent bone lesions. IMPRESSION: Extensive infiltrate on the right, most notably in the anterior segment right upper lobe. Left lung clear. No adenopathy evident. Followup PA and lateral chest radiographs recommended in 3-4 weeks following trial of antibiotic therapy to ensure resolution and exclude underlying malignancy. Electronically Signed   By: Bretta Bang III M.D.   On: 04/12/2015 09:55   Ct Head Wo Contrast  04/20/2015  CLINICAL DATA:  Weakness. Speech difficulty. Slurred speech. Headache appear EXAM: CT HEAD WITHOUT CONTRAST TECHNIQUE: Contiguous axial images were obtained from the base of the skull through the vertex without intravenous contrast. COMPARISON:  04/04/2015 FINDINGS: Global atrophy. Chronic ischemic changes in the periventricular white matter. No mass effect, midline shift, or acute hemorrhage. Calcifications of the left lobe with slight shrinking suggest early phthisis bulbi. Visualized  paranasal sinuses and mastoid air cells are clear. Cranium is intact. IMPRESSION: No acute intracranial pathology. Electronically Signed   By: Jolaine Click M.D.   On: 04/20/2015 11:42   Ct Head Wo Contrast  04/04/2015  CLINICAL DATA:  Larey Seat last week. Fell from bed with trauma to the head and neck. Occipital region pain. Symptoms worsening. EXAM: CT HEAD WITHOUT CONTRAST CT CERVICAL SPINE WITHOUT CONTRAST TECHNIQUE: Multidetector CT imaging of the head and cervical spine was performed following the standard protocol without intravenous contrast. Multiplanar CT image reconstructions of the cervical spine were also generated. COMPARISON:  04/01/2015 FINDINGS: CT HEAD FINDINGS The brain shows atrophy with chronic small-vessel ischemic changes throughout the white matter, thalami I and pons. No sign of acute infarction, mass lesion, hemorrhage, hydrocephalus or extra-axial collection. No skull fracture. No fluid in the sinuses, middle ears or mastoids. Chronic calcification of the left globe but as previously seen. CT CERVICAL SPINE FINDINGS No evidence of fracture or traumatic malalignment. There chronic degenerative changes at the C1-2 articulation but without encroachment upon the neural spaces. C2-3:  Chronic facet fusion on the left.  No stenosis. C3-4: Facet degeneration on the left. Mild foraminal narrowing. No significant stenosis. C4-5: Spondylosis more prominent towards the right. No significant stenosis. C5-6:  Spondylosis.  No significant stenosis. C6-7: Spondylosis. Facet degeneration on the left. No significant stenosis. C7-T1:  Mild facet degeneration.  No stenosis. IMPRESSION: Head CT: No acute or traumatic finding. Atrophy and chronic small vessel ischemic changes. Cervical spine CT: No acute or traumatic finding. Extensive chronic degenerative changes. Electronically Signed   By: Paulina Fusi M.D.   On: 04/04/2015 10:10  Ct Head Wo Contrast  04/01/2015  CLINICAL DATA:  Posttraumatic headache after  falling out of bed 2 days ago. No reported loss of consciousness. EXAM: CT HEAD WITHOUT CONTRAST CT CERVICAL SPINE WITHOUT CONTRAST TECHNIQUE: Multidetector CT imaging of the head and cervical spine was performed following the standard protocol without intravenous contrast. Multiplanar CT image reconstructions of the cervical spine were also generated. COMPARISON:  CT scan of head of July 10, 2014. FINDINGS: CT HEAD FINDINGS Bony calvarium appears intact. Mild diffuse cortical atrophy is noted. Mild chronic ischemic white matter disease is noted. No mass effect or midline shift is noted. Ventricular size is within normal limits. There is no evidence of mass lesion, hemorrhage or acute infarction. CT CERVICAL SPINE FINDINGS Reversal of normal lordosis is noted due to degenerative disc disease. Severe degenerative disc disease is noted at C4-5, C5-6 and C6-7 with anterior osteophyte formation. No fracture or spondylolisthesis is noted. Visualized upper lung zones appear normal. IMPRESSION: Mild diffuse cortical atrophy. Mild chronic ischemic white matter disease. No acute intracranial abnormality seen. Severe multilevel degenerative disc disease. No acute abnormality seen in the cervical spine. Electronically Signed   By: Lupita Raider, M.D.   On: 04/01/2015 14:05   Ct Cervical Spine Wo Contrast  04/04/2015  CLINICAL DATA:  Larey Seat last week. Fell from bed with trauma to the head and neck. Occipital region pain. Symptoms worsening. EXAM: CT HEAD WITHOUT CONTRAST CT CERVICAL SPINE WITHOUT CONTRAST TECHNIQUE: Multidetector CT imaging of the head and cervical spine was performed following the standard protocol without intravenous contrast. Multiplanar CT image reconstructions of the cervical spine were also generated. COMPARISON:  04/01/2015 FINDINGS: CT HEAD FINDINGS The brain shows atrophy with chronic small-vessel ischemic changes throughout the white matter, thalami I and pons. No sign of acute infarction, mass  lesion, hemorrhage, hydrocephalus or extra-axial collection. No skull fracture. No fluid in the sinuses, middle ears or mastoids. Chronic calcification of the left globe but as previously seen. CT CERVICAL SPINE FINDINGS No evidence of fracture or traumatic malalignment. There chronic degenerative changes at the C1-2 articulation but without encroachment upon the neural spaces. C2-3:  Chronic facet fusion on the left.  No stenosis. C3-4: Facet degeneration on the left. Mild foraminal narrowing. No significant stenosis. C4-5: Spondylosis more prominent towards the right. No significant stenosis. C5-6:  Spondylosis.  No significant stenosis. C6-7: Spondylosis. Facet degeneration on the left. No significant stenosis. C7-T1:  Mild facet degeneration.  No stenosis. IMPRESSION: Head CT: No acute or traumatic finding. Atrophy and chronic small vessel ischemic changes. Cervical spine CT: No acute or traumatic finding. Extensive chronic degenerative changes. Electronically Signed   By: Paulina Fusi M.D.   On: 04/04/2015 10:10   Ct Cervical Spine Wo Contrast  04/01/2015  CLINICAL DATA:  Posttraumatic headache after falling out of bed 2 days ago. No reported loss of consciousness. EXAM: CT HEAD WITHOUT CONTRAST CT CERVICAL SPINE WITHOUT CONTRAST TECHNIQUE: Multidetector CT imaging of the head and cervical spine was performed following the standard protocol without intravenous contrast. Multiplanar CT image reconstructions of the cervical spine were also generated. COMPARISON:  CT scan of head of July 10, 2014. FINDINGS: CT HEAD FINDINGS Bony calvarium appears intact. Mild diffuse cortical atrophy is noted. Mild chronic ischemic white matter disease is noted. No mass effect or midline shift is noted. Ventricular size is within normal limits. There is no evidence of mass lesion, hemorrhage or acute infarction. CT CERVICAL SPINE FINDINGS Reversal of normal lordosis is noted due  to degenerative disc disease. Severe degenerative  disc disease is noted at C4-5, C5-6 and C6-7 with anterior osteophyte formation. No fracture or spondylolisthesis is noted. Visualized upper lung zones appear normal. IMPRESSION: Mild diffuse cortical atrophy. Mild chronic ischemic white matter disease. No acute intracranial abnormality seen. Severe multilevel degenerative disc disease. No acute abnormality seen in the cervical spine. Electronically Signed   By: Lupita RaiderJames  Green Jr, M.D.   On: 04/01/2015 14:05   Dg Chest Port 1 View  04/20/2015  CLINICAL DATA:  Weakness for 5 days.  Episodes of hypoxia EXAM: PORTABLE CHEST 1 VIEW COMPARISON:  April 13, 2015 FINDINGS: There is increased airspace consolidation in portions of the right upper and right lower lobes. There is underlying interstitial edema with cardiomegaly and pulmonary venous hypertension. No adenopathy evident. No bone lesions. IMPRESSION: Persistent underlying congestive heart failure. New areas of consolidation throughout portions the right upper and lower lobes. Question alveolar edema versus superimposed pneumonia. Both entities may exist concurrently. Electronically Signed   By: Bretta BangWilliam  Woodruff III M.D.   On: 04/20/2015 10:35   Dg Chest Port 1v Same Day  04/21/2015  CLINICAL DATA:  X 5 days patient has been severley SOB, Dr states yesterday he had fluid on his lungs per family member. HX HTN, diabetes, asthma, atrial fibrillation EXAM: PORTABLE CHEST 1 VIEW COMPARISON:  04/20/2015 FINDINGS: Scattered airspace opacities throughout the right lung and at the left lung base, slightly improved since previous day's exam. Heart size upper limits normal for technique. No pneumothorax. No effusion. Regional bones unremarkable. IMPRESSION: 1. Slight improvement in asymmetric infiltrates or edema. Electronically Signed   By: Corlis Leak  Hassell M.D.   On: 04/21/2015 09:37    Jeoffrey MassedGHIMIRE,Shloimy Michalski, MD  [facility] Pager:[phone]  If 7PM-7AM, please contact night-coverage [email or web address] Password  [email or web address] 04/21/2015, 5:23 PM   LOS: 1 day

## 2015-04-21 NOTE — Evaluation (Signed)
Occupational Therapy Evaluation Patient Details Name: [name] [name] MRN: [id number] DOB: February 14, 1933 Today's Date: 04/21/2015    History of Present Illness Pt is 82 y/o male w/ PMH: diabetes, A-fib on coumadin, HTN, end-stage renal disease, generalized wekness and inability to ambulate per chart review. Patient was discharged from [facility] on 04/17/2015 after admission for acute respiratory failure secondary to HcAP and generalized weakness. Per patient's wife he has been very weak and altered in his mentation for greater than 10 days. She was unable to state any change in his physical or mental status since discharge on 04/17/2015. Patient received his full dialysis session on 04/19/2015 4 hours. Family states medical compliance with medications. Pt was admitted dx: Paroxysmal atrial fibulation.   Clinical Impression   Pt admitted as above:  Pt demonstrates significant deficits in his ability to perform ADL's (see OT problem list below). Pt was Mod-max A +2 for attempted sit EOB & repeatedly attempted to lay down "And just slide out of the bed, that'd be better". Pt was assisted back to bed with +2 & bed tilted for gravity assist.. No family present to assist with PLOF since recent d/c home from hospital on 04/17/15. Recommend use of lift for any transfers for safety and trial of OT to see if pt able to participate in ADL's and decrease burden of care. Rec SNF w/ 24/7 assist.    Follow Up Recommendations  SNF;Supervision/Assistance - 24 hour    Equipment Recommendations  Other (comment) (Defer to next venue)    Recommendations for Other Services       Precautions / Restrictions Precautions Precautions: Fall;Other (comment) Precaution Comments: O2 at 2L via nasal cannula      Mobility Bed Mobility Overal bed mobility: Needs Assistance Bed Mobility: Supine to Sit;Sit to Supine     Supine to sit: Mod assist;+2 for physical assistance;HOB elevated Sit to supine: Mod  assist;+2 for physical assistance   General bed mobility comments: Pt required assist of LE's and trunk. Pt repeatedly attempting to lean posteriorly and  stating "It's easier to just slide off the bed" Not safe for OOB transfers at this time. When performing sit to supine, bed was inverted for gravity assist as pt was unable to follow commands or provide much assist.  Transfers Overall transfer level: Needs assistance               General transfer comment: Pt not able to perform OOB transfer today secondary to safety. RN staff made aware that lift would be appropriate for any OOB activity.    Balance Overall balance assessment: Needs assistance Sitting-balance support: Bilateral upper extremity supported;Feet supported;Feet unsupported Sitting balance-Leahy Scale: Zero Sitting balance - Comments: Pt was not able to sit EOB despite multiple attempts today and +2 assist. Pt w/ strong posterior lean and unable to get himself back upright given mod-max assist.                                     ADL Overall ADL's : Needs assistance/impaired Eating/Feeding: Supervision/ safety;Set up;Minimal assistance;Bed level Eating/Feeding Details (indicate cue type and reason): Decreased coordination L > R noted, pt with difficulty holding cup that nurse tech handed him and he spilled it. Linens were replaced and pt was assisted in cleaning up. Grooming: Therapist, nutritionalWash/dry face;Set up;Bed level;Minimal assistance   Upper Body Bathing: Minimal assitance;Bed level   Lower Body Bathing: +2 for physical  assistance;Maximal assistance       Lower Body Dressing: Maximal assistance;+2 for physical assistance;Bed level   Toilet Transfer:  (Pt is not safe for OOB transfer after +2 attempt to sit EOB. NT, Use lift for safety. Nurse tech made aware of this.)   Toileting- Clothing Manipulation and Hygiene: +2 for physical assistance;Total assistance;Bed level       Functional mobility during ADLs:   (Pt not currently safe for OOB transfer. Rec use of lift for transfers. Nurse Tech made aware of this.) General ADL Comments: Pt was assessed and educated in role of OT. Pt was agreeable to OT ADL retraining session for sitting EOB for balance. Pt demonstrates significant deficits in his ability to perform ADL's. Pt was Mod-max A +2 for attempted sit EOB & repeatedly attempted to lay down "And just slide out of the bed, that'd be better". Pt was assisted back to bed with +2 & bed tilted for gravity assist.. No family present to assist with PLOF since recent d/c home from hospital on 04/17/15. Recommend use of lift for any transfers for safety and trial of OT to see if pt able to participate in ADL's and decrease burden of care. Rec SNF w/ 24/7 assist..     Vision  Legally blind left eye.    Perception     Praxis      Pertinent Vitals/Pain Pain Assessment: No/denies pain     Hand Dominance Right   Extremity/Trunk Assessment Upper Extremity Assessment Upper Extremity Assessment: Generalized weakness;LUE deficits/detail;RUE deficits/detail RUE Coordination: decreased fine motor;decreased gross motor LUE Coordination: decreased fine motor;decreased gross motor   Lower Extremity Assessment Lower Extremity Assessment: Defer to PT evaluation       Communication Communication Communication: Other (comment) (Slurred/garbled speech at baseline per pt report)   Cognition Arousal/Alertness: Awake/alert Behavior During Therapy: WFL for tasks assessed/performed Overall Cognitive Status: No family/caregiver present to determine baseline cognitive functioning Area of Impairment: Memory;Safety/judgement;Orientation;Attention Orientation Level: Place;Time;Situation Current Attention Level: Focused     Safety/Judgement: Decreased awareness of safety;Decreased awareness of deficits         General Comments       Exercises       Shoulder Instructions      Home Living Family/patient  expects to be discharged to:: Unsure Living Arrangements: Spouse/significant other Available Help at Discharge: Family;Available 24 hours/day;Other (Comment) ("She will help me if she will, but ashe's not in good health") Type of Home: House Home Access: Stairs to enter [employer] of Steps: 2   Home Layout: One level     Bathroom Shower/Tub: Tub/shower unit         Home Equipment: Cane - single point;Walker - 2 wheels   Additional Comments: Zenaida Niece comes to get pt for dialysis, pt reports up until ~3 weeks ago pt was able to walk short distances with RW       Prior Functioning/Environment Level of Independence: Needs assistance  Gait / Transfers Assistance Needed: Uses RW ADL's / Homemaking Assistance Needed: Pt reports that he requires assistance "for bathing, dressing and all that" Communication / Swallowing Assistance Needed: Slurred speech is baseline level per pt report Comments: No family present to assist with PLOF since last admission/discharge on 04/17/15.    OT Diagnosis: Generalized weakness;Cognitive deficits;Blindness and low vision   OT Problem List: Decreased strength;Decreased cognition;Decreased activity tolerance;Decreased safety awareness;Decreased knowledge of use of DME or AE;Impaired balance (sitting and/or standing);Impaired vision/perception;Decreased knowledge of precautions   OT Treatment/Interventions: Self-care/ADL training;Patient/family education;Visual/perceptual remediation/compensation;Therapeutic activities;Therapeutic  exercise;Balance training    OT Goals(Current goals can be found in the care plan section) Acute Rehab OT Goals Patient Stated Goal: None stated OT Goal Formulation: Patient unable to participate in goal setting Time For Goal Achievement: 05/05/15 Potential to Achieve Goals: Fair ADL Goals Pt Will Perform Eating: with set-up;bed level Pt Will Perform Grooming: with set-up;with supervision;bed level Pt Will Perform Upper  Body Bathing: with supervision;bed level Additional ADL Goal #1: Pt will sit EOB w/o LOB for 3-5 min or more in preparation for increased participation in ADL's and self care tasks.  OT Frequency: Min 2X/week   Barriers to D/C:            Co-evaluation              End of Session Equipment Utilized During Treatment: Oxygen Nurse Communication: Need for lift equipment;Other (comment) (+2 for safety and lift for transfers)  Activity Tolerance: Patient limited by lethargy Patient left: in bed;with call bell/phone within reach;with bed alarm set;with nursing/sitter in room   Time: 0918-0950 OT Time Calculation (min): 32 min Charges:  OT General Charges $OT Visit: 1 Procedure OT Evaluation $OT Eval Moderate Complexity: 1 Procedure OT Treatments $Self Care/Home Management : 8-22 mins G-Codes:    Roselie Awkward Dixon, OTR/L 04/21/2015, 10:16 AM

## 2015-04-21 NOTE — Progress Notes (Signed)
Patient drowsy,slow to respond. CBG checked,50. 1/2 amp  D50 given. Patient less drowsy, responding to questions, able to take po meds. Will recheck CBG in 15 minutes.

## 2015-04-21 NOTE — Progress Notes (Signed)
[facility] Progress Note   Subjective: breathing "a little better"  Filed Vitals:   04/21/15 0500 04/21/15 0601 04/21/15 1000 04/21/15 1358  BP:  137/70 131/67   Pulse:  61 60   Temp:  98.2 F (36.8 C) 98.2 F (36.8 C)   TempSrc:  Oral Oral   Resp:  18 18   Height:     (1.93 m)  Weight: 99.9 kg (220 lb 3.8 oz)     SpO2:  96% 98%     Inpatient medications: . amiodarone  400 mg Oral Daily  . amLODipine  10 mg Oral QHS  . aspirin EC  81 mg Oral Daily  . atropine  1 drop Left Eye q12n4p  . calcium acetate  1,334 mg Oral TID WC  . ceFEPime (MAXIPIME) IV  2 g Intravenous Q M,W,F-1800  . cinacalcet  60 mg Oral QHS  . darbepoetin (ARANESP) injection - DIALYSIS  60 mcg Intravenous Q Wed-HD  . dorzolamide-timolol  1 drop Right Eye BID  . heparin  3,000 Units Dialysis Once in dialysis  . heparin  4,000 Units Dialysis Once in dialysis  . insulin aspart  0-5 Units Subcutaneous QHS  . insulin aspart  0-9 Units Subcutaneous TID WC  . latanoprost  1 drop Right Eye QHS  . prednisoLONE acetate  1 drop Left Eye BID  . simvastatin  20 mg Oral QPM  . sodium chloride flush  3 mL Intravenous Q12H  . tobramycin-dexamethasone  1 drop Left Eye QID  . vancomycin  1,000 mg Intravenous Q M,W,F-HD  . warfarin  7.5 mg Oral ONCE-1800  . Warfarin - Pharmacist Dosing Inpatient   Does not apply q1800   . dextrose 5 % and 0.9 % NaCl with KCl 40 mEq/L 10 mL/hr at 04/21/15 0100   sodium chloride, sodium chloride, sodium chloride, sodium chloride, alteplase, alteplase, calcium acetate, heparin, heparin, lidocaine (PF), lidocaine (PF), lidocaine-prilocaine, lidocaine-prilocaine, lidocaine-prilocaine, ondansetron **OR** ondansetron (ZOFRAN) IV, pentafluoroprop-tetrafluoroeth, pentafluoroprop-tetrafluoroeth  Exam: Alert, no distress, up in chair +jvd Chest bilat scattered rales lower lung fields, occ rhonchi Irreg rhythm, no RG Abd soft ntnd no mass or ascites Ext trace edema  only Darkening of skin bilat feet, no ulcers Neuro alert, nf, gen'd weak  Dialysis: [facility] MWF 4h 104kg 3/2.25 bath LUA AVF Hep 4200 8.3/ 48%/ -- > mircera 75 q2 last 3/8 9.2/ 4.9/ 229 pth > hect 5, phoslo 2ac, sensipar 60 BP > norvasc 10/ valsartan 80      Assessment: 1 Pulm edema - prob main issue causing DOE/ gen weakness. Has lost sig body wt, pulled 3kg off yest and plan HD again today then repeat CXR  2 ESRD MWF HD 3 Afib on amio/ coumadin 4 HTN on norvasc (holding valsartan), BP ok 5 MBD - cont meds 6 Anemia - Hb 8-9 cont esa , next dose tomorrow 7 MBD - Ca high, dc vit D 8 DM - on glipizide at home, BS"s low "25 " at night recently per pts wife. On hold.   Plan - HD again today, get vol down, repeat film after   Vinson Moselle MD [facility] pager [phone]    cell [phone] 04/21/2015, 1:59 PM    Recent Labs Lab 04/16/15 0822 04/20/15 1044 04/21/15 0650  NA 131* 139 138  K 4.4 4.1 4.4  CL 92* 96* 97*  CO2 GLUCOSE 142* 109* 100*  BUN 42* 36* 20  CREATININE 9.22* 8.41* 6.06*  CALCIUM 9.6  10.0 9.2    Recent Labs Lab 04/21/15 0650  AST 22  ALT 22  ALKPHOS 50  BILITOT 0.7  PROT 7.0  ALBUMIN 2.3*    Recent Labs Lab 04/16/15 0822 04/20/15 1044 04/21/15 0650  WBC 8.1 7.4 6.3  HGB 8.1* 9.3* 7.7*  HCT 24.9* 27.9* 23.6*  MCV 91.5 92.1 93.7  PLT 284 268 235

## 2015-04-21 NOTE — Clinical Documentation Improvement (Signed)
Nephrology/Renal  Can the diagnosis of anemia be further specified?   Anemia of chronic disease, including the associated chronic disease state  Nutritional anemia, including the nutrition or mineral deficits  Chronic Anemia, including the suspected or known cause  Iron deficiency anemia  Other  Clinically Undetermined   Please exercise your independent, professional judgment when responding. A specific answer is not anticipated or expected.   Thank You,  Cherylann Ratelonna J Roch Quach, RN, BSN Health Information Management [place] [phone]

## 2015-04-21 NOTE — Progress Notes (Signed)
CBG on recheck was 101. Patient asymptomatic.

## 2015-04-21 NOTE — Progress Notes (Signed)
ANTICOAGULATION CONSULT NOTE - Follow Up Consult  Pharmacy Consult for Warfarin Indication: atrial fibrillation  Allergies  Allergen Reactions  . Diltiazem Other (See Comments)    SEVERE STOMACH CRAMPING PER PT    Patient Measurements: Height: 6\' 4"  (193 cm) Weight: 222 lb 10.6 oz (101 kg) (bedscale) IBW/kg (Calculated) : 86.8  Vital Signs: Temp: 98.2 F (36.8 C) (03/22 1343) Temp Source: Oral (03/22 1343) BP: 106/65 mmHg (03/22 1430) Pulse Rate: 61 (03/22 1430)  Labs:  Recent Labs  04/20/15 1044 04/21/15 0650  HGB 9.3* 7.7*  HCT 27.9* 23.6*  PLT 268 235  LABPROT 29.1* 27.1*  INR 2.81* 2.55*  CREATININE 8.41* 6.06*    Estimated Creatinine Clearance: 12.1 mL/min (by C-G formula based on Cr of 6.06).   Assessment: [age] YOM with ESRD who presented on 3/21 with generalized weakness and confusion. PTA the patient was on warfarin for hx Afib - pharmacy has been asked to resume this admission.  INR today remains therapeutic (INR 2.55 << 2.81, goal of 2-3). It is unclear if the dose on 3/21 was given or not - the RN notified for the dose and it was sent up however [facility] Hgb 7.7 << 9.3, plts wnl - no bleeding noted. Will watch CBC closely  PTA dose was noted to be 7.5 mg daily EXCEPT for 5 mg on MWF  Goal of Therapy:  INR 2-3   Plan:  1. Warfarin 7.5 mg x 1 dose at 1800 today 2. Will continue to monitor for any signs/symptoms of bleeding and will follow up with PT/INR in the a.m.   Georgina PillionElizabeth Makaylia Hewett, PharmD, BCPS Clinical Pharmacist Pager: [phone] 04/21/2015 2:53 PM

## 2015-04-22 ENCOUNTER — Inpatient Hospital Stay (HOSPITAL_COMMUNITY): Payer: Medicare Other

## 2015-04-22 DIAGNOSIS — J9601 Acute respiratory failure with hypoxia: Secondary | ICD-10-CM

## 2015-04-22 LAB — RENAL FUNCTION PANEL
Albumin: 2.2 g/dL — ABNORMAL LOW (ref 3.5–5.0)
Anion gap: 11 (ref 5–15)
BUN: 14 mg/dL (ref 6–20)
CHLORIDE: 95 mmol/L — AB (ref 101–111)
CO2: 28 mmol/L (ref 22–32)
Calcium: 9.1 mg/dL (ref 8.9–10.3)
Creatinine, Ser: 4.86 mg/dL — ABNORMAL HIGH (ref 0.61–1.24)
GFR calc Af Amer: 12 mL/min — ABNORMAL LOW (ref >60)
GFR, EST NON AFRICAN AMERICAN: 10 mL/min — AB (ref >60)
Glucose, Bld: 101 mg/dL — ABNORMAL HIGH (ref 65–99)
POTASSIUM: 4.6 mmol/L (ref 3.5–5.1)
Phosphorus: 3.4 mg/dL (ref 2.5–4.6)
Sodium: 134 mmol/L — ABNORMAL LOW (ref 135–145)

## 2015-04-22 LAB — CBC
HEMATOCRIT: 26.2 % — AB (ref 39.0–52.0)
Hemoglobin: 8.1 g/dL — ABNORMAL LOW (ref 13.0–17.0)
MCH: 29 pg (ref 26.0–34.0)
MCHC: 30.9 g/dL (ref 30.0–36.0)
MCV: 93.9 fL (ref 78.0–100.0)
PLATELETS: 267 10*3/uL (ref 150–400)
RBC: 2.79 MIL/uL — ABNORMAL LOW (ref 4.22–5.81)
RDW: 15.4 % (ref 11.5–15.5)
WBC: 6.5 10*3/uL (ref 4.0–10.5)

## 2015-04-22 LAB — PROCALCITONIN: PROCALCITONIN: 2.91 ng/mL

## 2015-04-22 LAB — GLUCOSE, CAPILLARY
GLUCOSE-CAPILLARY: 99 mg/dL (ref 65–99)
GLUCOSE-CAPILLARY: 202 mg/dL — AB (ref 65–99)
Glucose-Capillary: 168 mg/dL — ABNORMAL HIGH (ref 65–99)
Glucose-Capillary: 109 mg/dL — ABNORMAL HIGH (ref 65–99)
Glucose-Capillary: 107 mg/dL — ABNORMAL HIGH (ref 65–99)

## 2015-04-22 LAB — PROTIME-INR
INR: 2.35 — ABNORMAL HIGH (ref 0.00–1.49)
Prothrombin Time: 25.5 seconds — ABNORMAL HIGH (ref 11.6–15.2)

## 2015-04-22 MED ORDER — ACETAMINOPHEN 325 MG PO TABS
650.0000 mg | ORAL_TABLET | Freq: Four times a day (QID) | ORAL | Status: DC | PRN
  Administered 2015-04-22: 650 mg via ORAL
  Filled 2015-04-22: qty 2

## 2015-04-22 MED ORDER — WARFARIN SODIUM 7.5 MG PO TABS
7.5000 mg | ORAL_TABLET | Freq: Once | ORAL | Status: AC
  Administered 2015-04-22: 7.5 mg via ORAL
  Filled 2015-04-22: qty 1

## 2015-04-22 NOTE — Evaluation (Signed)
Clinical/Bedside Swallow Evaluation Patient Details  Name: [name] [name] MRN: [id number] Date of Birth: 12-Sep-1933  Today's Date: 04/22/2015 Time: SLP Start Time (ACUTE ONLY): 1104 SLP Stop Time (ACUTE ONLY): 1118 SLP Time Calculation (min) (ACUTE ONLY): 14 min  Past Medical History:  Past Medical History  Diagnosis Date  . Diabetes mellitus without complication (HCC)   . Atrial fibrillation (HCC)   . Hypertension   . Glaucoma   . Peripheral neuropathy (HCC)   . Gout   . Anemia   . ESRD (end stage renal disease) on dialysis [facility](HCC)     "MWF; [address]" (04/08/2015)   Past Surgical History:  Past Surgical History  Procedure Laterality Date  . Ablation of dysrhythmic focus    . Sp av dialysis shunt access existing *l*     HPI:  Pt is 82 y/o male w/ PMH: diabetes, A-fib on coumadin, HTN, end-stage renal disease, generalized wekness and inability to ambulate per chart review. Patient was [facility] from [facility] on 04/17/2015 after admission for acute respiratory failure secondary to HCAP and generalized weakness. Per patient's wife he has been very weak and altered in his mentation for greater than 10 days. She was unable to state any change in his physical or mental status since discharge on 04/17/2015. Patient received his full dialysis session on 04/19/2015 4 hours. Family states medical compliance with medications. Previous BSE 04/13/15 recommended regular diet and thin liquids with swallow appearing WFL. Pt was admitted dx: Paroxysmal atrial fibrillation.   Assessment / Plan / Recommendation Clinical Impression  Pt has one delayed cough following completion of the 3 ounce water test, otherwise with no overt s/s of aspiration. Swallow occurs swiftly, and he appears to have adequate coordination of breath/swallow sequence. Pt says that his recent admission was the first time he had been dx with PNA, and he says that he coughs with PO intake infrequently. Recommend  regular diet and thin liquids but will f/u briefly while in house for tolerance.    Aspiration Risk  Mild aspiration risk    Diet Recommendation Regular;Thin liquid   Liquid Administration via: Cup;Straw Medication Administration: Whole meds with liquid Supervision: Patient able to self feed;Intermittent supervision to cue for compensatory strategies Compensations: Slow rate;Small sips/bites Postural Changes: Seated upright at 90 degrees    Other  Recommendations Oral Care Recommendations: Oral care BID   Follow up Recommendations  None    Frequency and Duration min 2x/week  1 week       Prognosis        Swallow Study   General HPI: Pt is 82 y/o male w/ PMH: diabetes, A-fib on coumadin, HTN, end-stage renal disease, generalized wekness and inability to ambulate per chart review. Patient was [facility] on 04/17/2015 after admission for acute respiratory failure secondary to HCAP and generalized weakness. Per patient's wife he has been very weak and altered in his mentation for greater than 10 days. She was unable to state any change in his physical or mental status since discharge on 04/17/2015. Patient received his full dialysis session on 04/19/2015 4 hours. Family states medical compliance with medications. Previous BSE 04/13/15 recommended regular diet and thin liquids with swallow appearing WFL. Pt was admitted dx: Paroxysmal atrial fibrillation. Type of Study: Bedside Swallow Evaluation Previous Swallow Assessment: see HPI Diet Prior to this Study: Regular;Thin liquids Temperature Spikes Noted: No Respiratory Status: Room air History of Recent Intubation: No Behavior/Cognition: Alert;Cooperative Oral Cavity Assessment: Within Functional  Limits Oral Care Completed by SLP: No Oral Cavity - Dentition: Edentulous Vision: Functional for self-feeding Self-Feeding Abilities: Able to feed self Patient Positioning: Upright in bed Baseline Vocal Quality:  Normal Volitional Cough: Strong Volitional Swallow: Able to elicit    Oral/Motor/Sensory Function Overall Oral Motor/Sensory Function: Within functional limits   Ice Chips Ice chips: Not tested   Thin Liquid Thin Liquid: Impaired Presentation: Cup;Self Fed;Straw Pharyngeal  Phase Impairments: Cough - Delayed (X1)    Nectar Thick Nectar Thick Liquid: Not tested   Honey Thick Honey Thick Liquid: Not tested   Puree Puree: Within functional limits   Solid   GO   Solid: Within functional limits       Maxcine Ham, M.A. CCC-SLP [phone]  Maxcine Ham 04/22/2015,11:31 AM

## 2015-04-22 NOTE — [place] FL2 (Signed)
[place] MEDICAID FL2 LEVEL OF CARE SCREENING TOOL     IDENTIFICATION  Patient Name: [name] [name] Birthdate: 07/21/1933 Sex: male Admission Date (Current Location): 04/20/2015  [facility] and [place] Number:  Producer, television/film/video and Address:  [facility] [place], [address] [place], [place] [address]      Provider Number: [id number]  Attending Physician Name and Address:  Maretta Bees, MD  Relative Name and Phone Number:  Jarnell Cordaro [phone]) - wife    Current Level of Care: Hospital Recommended Level of Care: Skilled Nursing Facility Prior Approval Number:    Date Approved/Denied: 04/22/15 PASRR Number: [id number]   Discharge Plan: SNF    Current Diagnoses: Patient Active Problem List   Diagnosis Date Noted  . PAF (paroxysmal atrial fibrillation) (HCC) 04/20/2015  . Encephalopathy 04/20/2015  . Physical deconditioning 04/20/2015  . Diabetes mellitus with complication (HCC) 04/20/2015  . Acute respiratory failure with hypoxia (HCC)   . Atrial flutter with rapid ventricular response (HCC)   . Acute respiratory failure (HCC) 04/12/2015  . HCAP (healthcare-associated pneumonia) 04/12/2015  . Elevated troponin 04/12/2015  . Anemia 04/12/2015  . Bradycardia 11/19/2014  . Encounter for therapeutic drug monitoring 06/16/2014  . On amiodarone therapy 12/12/2012    Class: Chronic  . Sinus bradycardia-tachycardia syndrome (HCC) 01/11/2012    Class: Chronic  . Atrial flutter (HCC) 01/10/2012  . Blind left eye 01/10/2012  . ESRD (end stage renal disease) (HCC) 01/10/2012  . Atrial fibrillation with RVR (HCC) 01/10/2012  . Hypertension   . Peripheral neuropathy (HCC)   . Diabetes mellitus with ESRD (end-stage renal disease) (HCC)     Orientation RESPIRATION BLADDER Height & Weight     Self, Situation, Place  Normal Continent Weight: 216 lb 4.3 oz (98.1 kg) Height:   (193 cm)  BEHAVIORAL SYMPTOMS/MOOD NEUROLOGICAL BOWEL  NUTRITION STATUS      Continent Diet (Renal, Carb-modified with fluid restriction: , Fluid consistency: Thin)  AMBULATORY STATUS COMMUNICATION OF NEEDS Skin   Extensive Assist (2+ assist) Verbally Normal                       Personal Care Assistance Level of Assistance  Bathing, Feeding, Dressing Bathing Assistance: Maximum assistance (2+assist ) Feeding assistance: Limited assistance Dressing Assistance: Maximum assistance (2+ assist)     Functional Limitations Info  Sight, Hearing, Speech Sight Info: Impaired (Legally blind in left eye ) Hearing Info: Adequate Speech Info: Impaired (slurred speech )    SPECIAL CARE FACTORS FREQUENCY  PT (By licensed PT), OT (By licensed OT), Speech therapy     PT Frequency: Evaluated: 04-21-15, Min 2x/week  OT Frequency: Evaluated: 04-21-15, Min 2x/week      Speech Therapy Frequency: Evaluated: 04-22-15, Min 2x/week, 1 week       Contractures Contractures Info: Not present    Additional Factors Info  Code Status, Allergies Code Status Info: PARTIAL CODE  Allergies Info: Diltiazem            Current Medications (04/22/2015):  This is the current hospital active medication list Current Facility-Administered Medications  Medication Dose Route Frequency Provider Last Rate Last Dose  . 0.9 %  sodium chloride infusion  100 mL Intravenous PRN Delano Metz, MD      . 0.9 %  sodium chloride infusion  100 mL Intravenous PRN Delano Metz, MD      . acetaminophen (TYLENOL) tablet 650 mg  650 mg Oral Q6H PRN Shanker  Levora Dredge, MD   650 mg at 04/22/15 1142  . alteplase (CATHFLO ACTIVASE) injection 2 mg  2 mg Intracatheter Once PRN Delano Metz, MD      . amiodarone (PACERONE) tablet 400 mg  400 mg Oral Daily Ozella Rocks, MD   400 mg at 04/22/15 1006  . amLODipine (NORVASC) tablet 10 mg  10 mg Oral QHS Delano Metz, MD   10 mg at 04/21/15 0513  . aspirin EC tablet 81 mg  81 mg Oral Daily Ozella Rocks, MD   81 mg at 04/22/15  1006  . atropine 1 % ophthalmic solution 1 drop  1 drop Left Eye q12n4p Ozella Rocks, MD   1 drop at 04/22/15 1126  . calcium acetate (PHOSLO) capsule 1,334 mg  1,334 mg Oral TID WC Ozella Rocks, MD   1,334 mg at 04/22/15 0825  . calcium acetate (PHOSLO) capsule 667 mg  667 mg Oral PRN Lavone Neri Opyd, MD      . ceFEPIme (MAXIPIME) 2 g in dextrose 5 % 50 mL IVPB  2 g Intravenous Q M,W,F-1800 Faye Ramsay Rumbarger, RPH   2 g at 04/21/15 2014  . cinacalcet (SENSIPAR) tablet 60 mg  60 mg Oral QHS Ozella Rocks, MD   60 mg at 04/21/15 2305  . Darbepoetin Alfa (ARANESP) injection 60 mcg  60 mcg Intravenous Q Wed-HD Delano Metz, MD   60 mcg at 04/21/15 1441  . dextrose 5 % and 0.9 % NaCl with KCl 40 mEq/L infusion   Intravenous Continuous Ozella Rocks, MD 10 mL/hr at 04/21/15 0100    . dorzolamide-timolol (COSOPT) 22.3-6.8 MG/ML ophthalmic solution 1 drop  1 drop Right Eye BID Ozella Rocks, MD   1 drop at 04/22/15 1006  . heparin injection 1,000 Units  1,000 Units Dialysis PRN Delano Metz, MD      . latanoprost (XALATAN) 0.005 % ophthalmic solution 1 drop  1 drop Right Eye QHS Ozella Rocks, MD   1 drop at 04/21/15 2313  . lidocaine (PF) (XYLOCAINE) 1 % injection 5 mL  5 mL Intradermal PRN Delano Metz, MD      . lidocaine-prilocaine (EMLA) cream 1 application  1 application Topical PRN Ozella Rocks, MD      . lidocaine-prilocaine (EMLA) cream 1 application  1 application Topical PRN Delano Metz, MD      . ondansetron [facility]) tablet 4 mg  4 mg Oral Q6H PRN Ozella Rocks, MD       Or  . ondansetron [facility]) injection 4 mg  4 mg Intravenous Q6H PRN Ozella Rocks, MD      . pentafluoroprop-tetrafluoroeth Peggy[facility] Pitt) aerosol 1 application  1 application Topical PRN Delano Metz, MD      . prednisoLONE acetate (PRED FORTE) 1 % ophthalmic suspension 1 drop  1 drop Left Eye BID Ozella Rocks, MD   1 drop at 04/22/15 1008  . simvastatin (ZOCOR) tablet 20 mg  20 mg Oral QPM  Ozella Rocks, MD   20 mg at 04/21/15 2305  . sodium chloride flush (NS) 0.9 % injection 3 mL  3 mL Intravenous Q12H Briscoe Deutscher, MD      . tobramycin-dexamethasone [facility]) ophthalmic suspension 1 drop  1 drop Left Eye QID Ozella Rocks, MD   1 drop at 04/22/15 1128  . vancomycin (VANCOCIN) IVPB 1000 mg/200 mL premix  1,000 mg Intravenous Q M,W,F-HD Faye Ramsay Rumbarger, RPH   1,000 mg  at 04/21/15 1628  . warfarin (COUMADIN) tablet 7.5 mg  7.5 mg Oral ONCE-1800 Annamary CarolinCristina E Reyes, [facility]      . Warfarin - Pharmacist Dosing Inpatient   Does not apply q1800 Rosaland LaoRachel L Rumbarger, [facility]RPH         Discharge Medications: Please see discharge summary for a list of discharge medications.  Relevant Imaging Results:  Relevant Lab Results:   Additional Information SSN: [id number]   HD [facility]  Janeisha Ryle [place], Iowatudent-SW [phone]

## 2015-04-22 NOTE — Clinical Social Work Note (Signed)
Clinical Social Work Assessment  Patient Details  Name: [name] [name] MRN: [id number] Date of Birth: 01/03/1934  Date of referral:  04/22/15               Reason for consult:  Facility Placement, Discharge Planning                Permission sought to share information with:  Facility Industrial/product designerContact Representative Permission granted to share information::  Yes, Verbal Permission Granted  Name::     [name] [name]   Agency::     Relationship::  Spouse   Contact Information:  [phone]  Housing/Transportation Living arrangements for the past 2 months:  Single Family Home Source of Information:  Patient Patient Interpreter Needed:  None Criminal Activity/Legal Involvement Pertinent to Current Situation/Hospitalization:  No - Comment as needed Significant Relationships:  Spouse [name] [name]([name] [name] ) Lives with:  Spouse Do you feel safe going back to the place where you live?  Yes Need for family participation in patient care:  Yes (Comment)  Care giving concerns:  Not discussed    Social Worker assessment / plan:  Patient is alert and oriented to situation, place and person. CSW intern visited patient in room to discuss his discharge plans . CSW intern informed patient that PT/OT were recommending him receive short term rehab at a SNF before transitioning home. Mr. [name] [name] stated that he agreed to go to a SNF. CSW explained to patient the process for sending the patient's information to facilities and also provided a list of facilities to Mr. [name] [name]. Permission was granted from patient to contact his wife [name] [name] if needed. CSW intern attempted to contact wife via telephone [phone]) for facility preferences and more details for the patient's assessment, but did not get an answer. Attempted to leave a message but [name]'s voicemail is full.  There were no further questions or concerns at this time.   Employment status:  Retired Database administratornsurance information:  Managed Medicare PT Recommendations:   Skilled Nursing Facility Information / Referral to community resources:  Skilled Nursing Facility  Patient/Family's Response to care:  Not discussed   Patient/Family's Understanding of and Emotional Response to Diagnosis, Current Treatment, and Prognosis:  Not discussed   Emotional Assessment Appearance:  Appears stated age Attitude/Demeanor/Rapport:    Affect (typically observed):  Accepting, Calm, Appropriate Orientation:  Oriented to Self, Oriented to Place, Oriented to Situation Alcohol / Substance use:  Never Used Psych involvement (Current and /or in the community):  No (Comment)  Discharge Needs  Concerns to be addressed:  Discharge Planning Concerns Readmission within the last 30 days:  Yes Current discharge risk:  Other (Deconditioned ) Barriers to Discharge:  No Barriers Identified   Baldo DaubJolan Noriah Osgood, Student-SW 04/22/2015, 11:48 AM

## 2015-04-22 NOTE — Progress Notes (Signed)
Patient requesting Tylenol for headache.  MD notified.

## 2015-04-22 NOTE — Progress Notes (Signed)
ANTICOAGULATION CONSULT NOTE - Follow Up Consult  Pharmacy Consult for Warfarin Indication: atrial fibrillation  Allergies  Allergen Reactions  . Diltiazem Other (See Comments)    SEVERE STOMACH CRAMPING PER PT    Patient Measurements: Height: 6\' 4"  (193 cm) Weight: 216 lb 4.3 oz (98.1 kg) IBW/kg (Calculated) : 86.8  Vital Signs: Temp: 98.6 F (37 C) (03/23 1000) Temp Source: Oral (03/23 1000) BP: 142/56 mmHg (03/23 1000) Pulse Rate: 66 (03/23 1000)  Labs:  Recent Labs  04/20/15 1044 04/21/15 0650 04/22/15 0615  HGB 9.3* 7.7* 8.1*  HCT 27.9* 23.6* 26.2*  PLT 268 235 267  LABPROT 29.1* 27.1* 25.5*  INR 2.81* 2.55* 2.35*  CREATININE 8.41* 6.06* 4.86*    Estimated Creatinine Clearance: 15.1 mL/min (by C-G formula based on Cr of 4.86).   Assessment: [age] YOM with ESRD who presented on 3/21 with generalized weakness and confusion. PTA the patient was on warfarin for hx Afib - pharmacy has been asked to resume this admission.  INR today remains therapeutic. It is unclear if the dose on 3/21 was given or not - the RN notified for the dose and it was sent up however it is not charted as given [facility]. CBC stable - no bleeding noted.  PTA dose was noted to be 7.5 mg daily EXCEPT for 5 mg on MWF  Goal of Therapy:  INR 2-3   Plan:  1. Warfarin 7.5 mg x 1 dose at 1800 today 2. Will continue to monitor for any signs/symptoms of bleeding and will follow up with PT/INR in the a.m.   Greggory Stallionristy Reyes, PharmD Clinical Pharmacy Resident Pager # [phone] 04/22/2015 12:34 PM

## 2015-04-22 NOTE — Progress Notes (Signed)
[facility] Progress Note   Subjective: feeling better  Filed Vitals:   04/21/15 1740 04/21/15 2004 04/22/15 0409 04/22/15 1000  BP: 110/64 115/68 120/72 142/56  Pulse: 62 60 66 66  Temp: 97.8 F (36.6 C) 98.3 F (36.8 C) 98.5 F (36.9 C) 98.6 F (37 C)  TempSrc: Oral Oral Oral Oral  Resp: 16 18 19 20   Height:      Weight: 98.3 kg (216 lb 11.4 oz) 98.1 kg (216 lb 4.3 oz)    SpO2: 96% 97% 98% 97%    Inpatient medications: . amiodarone  400 mg Oral Daily  . amLODipine  10 mg Oral QHS  . aspirin EC  81 mg Oral Daily  . atropine  1 drop Left Eye q12n4p  . calcium acetate  1,334 mg Oral TID WC  . ceFEPime (MAXIPIME) IV  2 g Intravenous Q M,W,F-1800  . cinacalcet  60 mg Oral QHS  . darbepoetin (ARANESP) injection - DIALYSIS  60 mcg Intravenous Q Wed-HD  . dorzolamide-timolol  1 drop Right Eye BID  . latanoprost  1 drop Right Eye QHS  . prednisoLONE acetate  1 drop Left Eye BID  . simvastatin  20 mg Oral QPM  . sodium chloride flush  3 mL Intravenous Q12H  . tobramycin-dexamethasone  1 drop Left Eye QID  . warfarin  7.5 mg Oral ONCE-1800  . Warfarin - Pharmacist Dosing Inpatient   Does not apply q1800   . dextrose 5 % and 0.9 % NaCl with KCl 40 mEq/L 10 mL/hr at 04/21/15 0100   sodium chloride, sodium chloride, acetaminophen, alteplase, calcium acetate, heparin, lidocaine (PF), lidocaine-prilocaine, lidocaine-prilocaine, ondansetron **OR** ondansetron (ZOFRAN) IV, pentafluoroprop-tetrafluoroeth  Exam: Alert, no distress, up in chair +jvd Chest bilat scattered rales lower lung fields, occ rhonchi Irreg rhythm, no RG Abd soft ntnd no mass or ascites Ext trace edema only Darkening of skin bilat feet, no ulcers Neuro alert, nf, gen'd weak  F/U CXR - persistent RUL density, pulm edema resolved for the most part  Dialysis: [facility] MWF 4h 104kg 3/2.25 bath LUA AVF Hep 4200 8.3/ 48%/ -- > mircera 75 q2 last 3/8 9.2/ 4.9/ 229 pth > hect 5, phoslo 2ac,  sensipar 60 BP > norvasc 10/ valsartan 80      Assessment: 1 Gen weakness - due to #1, #2 and deconditioning. For SNF placement 2 Pulm edema - resolved, most of excess vol is removed 3 PNA - on cefipime per primary 4 ESRD MWF HD 5 Afib on amio/ coumadin 6 HTN bp's down, hold all bp meds 7 MBD - cont meds 8 Anemia - Hb 8-9 cont esa 9 MBD - Ca high, dc'd vit D 10 DM - on glipizide at home w low BS's early am per wife  Plan - HD tomorrow, dc norvasc.  SNF placement.    Vinson Moselleob Twilla Khouri MD [facility] pager [phone]    cell [phone] 04/22/2015, 4:25 PM    Recent Labs Lab 04/20/15 1044 04/21/15 0650 04/22/15 0615  NA 139 138 134*  K 4.1 4.4 4.6  CL 96* 97* 95*  CO2 26 27 28   GLUCOSE 109* 100* 101*  BUN 36* 20 14  CREATININE 8.41* 6.06* 4.86*  CALCIUM 10.0 9.2 9.1  PHOS  --   --  3.4    Recent Labs Lab 04/21/15 0650 04/22/15 0615  AST 22  --   ALT 22  --   ALKPHOS 50  --   BILITOT 0.7  --  PROT 7.0  --   ALBUMIN 2.3* 2.2*    Recent Labs Lab 04/20/15 1044 04/21/15 0650 04/22/15 0615  WBC 7.4 6.3 6.5  HGB 9.3* 7.7* 8.1*  HCT 27.9* 23.6* 26.2*  MCV 92.1 93.7 93.9  PLT 268 235 267

## 2015-04-22 NOTE — Progress Notes (Addendum)
PATIENT DETAILS Name: [name] [name] Age: 82 y.o. Sex: male Date of Birth: 12/27/1933 Admit Date: 04/20/2015 Admitting Physician Briscoe Deutscherimothy S Opyd, MD ZOX:[id number]:POLITE,RONALD D, MD  Subjective: Feels much better.   Assessment/Plan: Active Problems: Acute hypoxemic respiratory failure: Suspect from noncardiogenic pulmonary edema in a setting of hemodialysis vs HCAP.  Chest x-ray with only some improvement post dialysis x 2, suspect does have a component of PNA. Attempt to wean off O2,Echo pending  HCAP:Initally thought to have Pul edema in a setting of HD-however CXR continues to shows mostly right sided infiltrates even after HD X 2-also procalcitonin high-so suspect did have some PNA component. Since blood cx and MRSA PCR negative, will d/c Vancomycin and continue Cefepime-suspect could transition oral Abx tomorrow.  Acute encephalopathy:  Seems to have improved,  answering questions appropriately this morning.  ESRD: On hemodialysis MWF-see above  Atrial fibrillation: Sinus rhythm, continue amiodarone, Coumadin per pharmacy. INR therapeutic.  Type 2 diabetes: Not on any oral hypoglycemic agents, CBGs on the lower side on admission-hence SSI was stopped. Follow.  Hypertension: Controlled with amlodipine. Follow  Anemia: Likely secondary to end-stage renal disease, no indication of blood loss. Follow for now.   Physical deconditioning: Likely chronic issue, made worse by acute illness. PT evaluation.   Blind left eye  Disposition: Remain inpatient-SNF in 1-2 days  Antimicrobial agents  See below  Anti-infectives    Start     Dose/Rate Route Frequency Ordered Stop   04/21/15 1800  ceFEPIme (MAXIPIME) 2 g in dextrose 5 % 50 mL IVPB     2 g 100 mL/hr over 30 Minutes Intravenous Every M-W-F (1800) 04/20/15 1214     04/21/15 1439  vancomycin (VANCOCIN) 1 GM/200ML IVPB    Comments:  Woods, Angene   : cabinet override      04/21/15 1439 04/21/15 1628   04/21/15 1200   vancomycin (VANCOCIN) IVPB 1000 mg/200 mL premix     1,000 mg 200 mL/hr over 60 Minutes Intravenous Every M-W-F (Hemodialysis) 04/20/15 1214     04/20/15 1215  vancomycin (VANCOCIN) 2,000 mg in sodium chloride 0.9 % 500 mL IVPB     2,000 mg 250 mL/hr over 120 Minutes Intravenous  Once 04/20/15 1214 04/20/15 1551   04/20/15 1215  ceFEPIme (MAXIPIME) 2 g in dextrose 5 % 50 mL IVPB     2 g 100 mL/hr over 30 Minutes Intravenous  Once 04/20/15 1214 04/20/15 1254      DVT Prophylaxis: Coumadin  Code Status: DNI  Family Communication Spouse-Barbara [phone]-[phone] to leave message  Procedures: None  CONSULTS:  nephrology  Time spent 25 minutes-Greater than 50% of this time was spent in counseling, explanation of diagnosis, planning of further management, and coordination of care.  MEDICATIONS: Scheduled Meds: . amiodarone  400 mg Oral Daily  . amLODipine  10 mg Oral QHS  . aspirin EC  81 mg Oral Daily  . atropine  1 drop Left Eye q12n4p  . calcium acetate  1,334 mg Oral TID WC  . ceFEPime (MAXIPIME) IV  2 g Intravenous Q M,W,F-1800  . cinacalcet  60 mg Oral QHS  . darbepoetin (ARANESP) injection - DIALYSIS  60 mcg Intravenous Q Wed-HD  . dorzolamide-timolol  1 drop Right Eye BID  . latanoprost  1 drop Right Eye QHS  . prednisoLONE acetate  1 drop Left Eye BID  . simvastatin  20 mg Oral QPM  .  sodium chloride flush  3 mL Intravenous Q12H  . tobramycin-dexamethasone  1 drop Left Eye QID  . vancomycin  1,000 mg Intravenous Q M,W,F-HD  . warfarin  7.5 mg Oral ONCE-1800  . Warfarin - Pharmacist Dosing Inpatient   Does not apply q1800   Continuous Infusions: . dextrose 5 % and 0.9 % NaCl with KCl 40 mEq/L 10 mL/hr at 04/21/15 0100   PRN Meds:.sodium chloride, sodium chloride, acetaminophen, alteplase, calcium acetate, heparin, lidocaine (PF), lidocaine-prilocaine, lidocaine-prilocaine, ondansetron **OR** ondansetron (ZOFRAN) IV,  pentafluoroprop-tetrafluoroeth    PHYSICAL EXAM: Vital signs in last 24 hours: Filed Vitals:   04/21/15 1740 04/21/15 2004 04/22/15 0409 04/22/15 1000  BP: 110/64 115/68 120/72 142/56  Pulse: 62 60 66 66  Temp: 97.8 F (36.6 C) 98.3 F (36.8 C) 98.5 F (36.9 C) 98.6 F (37 C)  TempSrc: Oral Oral Oral Oral  Resp: 16 18 19 20   Height:      Weight: 98.3 kg (216 lb 11.4 oz) 98.1 kg (216 lb 4.3 oz)    SpO2: 96% 97% 98% 97%    Weight change: 1.9 kg (4 lb 3 oz) Filed Weights   04/21/15 1343 04/21/15 1740 04/21/15 2004  Weight: 101 kg (222 lb 10.6 oz) 98.3 kg (216 lb 11.4 oz) 98.1 kg (216 lb 4.3 oz)   Body mass index is 26.34 kg/(m^2).   Gen Exam:Much more Awake and alert this morning.Answering questions appropriately this morning.  Neck: Supple,  Chest:Clear to auscultation anteriorly CVS: S1 S2 irregular Abdomen: soft, BS +, non tender, non distended.  Extremities: no edema, lower extremities warm to touch. Neurologic: Non Focal-but with generalized weakness Skin: No Rash.   Wounds: N/A.    Intake/Output from previous day:  Intake/Output Summary (Last 24 hours) at 04/22/15 1255 Last data filed at 04/22/15 1000  Gross per 24 hour  Intake    280 ml  Output   2785 ml  Net  -2505 ml     LAB RESULTS: CBC  Recent Labs Lab 04/16/15 0822 04/20/15 1044 04/21/15 0650 04/22/15 0615  WBC 8.1 7.4 6.3 6.5  HGB 8.1* 9.3* 7.7* 8.1*  HCT 24.9* 27.9* 23.6* 26.2*  PLT 284 268 235 267  MCV 91.5 92.1 93.7 93.9  MCH 29.8 30.7 30.6 29.0  MCHC 32.5 33.3 32.6 30.9  RDW 15.2 15.8* 15.7* 15.4    Chemistries   Recent Labs Lab 04/16/15 0822 04/17/15 0751 04/20/15 1044 04/21/15 0650 04/22/15 0615  NA 131*  --  139 138 134*  K 4.4  --  4.1 4.4 4.6  CL 92*  --  96* 97* 95*  CO2 24  --  26 27 28   GLUCOSE 142*  --  109* 100* 101*  BUN 42*  --  36* 20 14  CREATININE 9.22*  --  8.41* 6.06* 4.86*  CALCIUM 9.6  --  10.0 9.2 9.1  MG  --  2.1  --   --   --      CBG:  Recent Labs Lab 04/21/15 1709 04/21/15 2225 04/22/15 0805 04/22/15 1122 04/22/15 1200  GLUCAP 97 218* 99 168* 202*    GFR Estimated Creatinine Clearance: 15.1 mL/min (by C-G formula based on Cr of 4.86).  Coagulation profile  Recent Labs Lab 04/16/15 0822 04/17/15 0421 04/20/15 1044 04/21/15 0650 04/22/15 0615  INR 2.01* 2.18* 2.81* 2.55* 2.35*    Cardiac Enzymes No results for input(s): CKMB, TROPONINI, MYOGLOBIN in the last 168 hours.  Invalid input(s): CK  Invalid input(s): POCBNP No  results for input(s): DDIMER in the last 72 hours. No results for input(s): HGBA1C in the last 72 hours. No results for input(s): CHOL, HDL, LDLCALC, TRIG, CHOLHDL, LDLDIRECT in the last 72 hours. No results for input(s): TSH, T4TOTAL, T3FREE, THYROIDAB in the last 72 hours.  Invalid input(s): FREET3 No results for input(s): VITAMINB12, FOLATE, FERRITIN, TIBC, IRON, RETICCTPCT in the last 72 hours. No results for input(s): LIPASE, AMYLASE in the last 72 hours.  Urine Studies No results for input(s): UHGB, CRYS in the last 72 hours.  Invalid input(s): UACOL, UAPR, USPG, UPH, UTP, UGL, UKET, UBIL, UNIT, UROB, ULEU, UEPI, UWBC, URBC, UBAC, CAST, UCOM, BILUA  MICROBIOLOGY: Recent Results (from the past 240 hour(s))  Culture, blood (routine x 2) Call MD if unable to obtain prior to antibiotics being given     Status: None   Collection Time: 04/12/15  1:14 PM  Result Value Ref Range Status   Specimen Description BLOOD RIGHT HAND  Final   Special Requests IN PEDIATRIC BOTTLE 2CC  Final   Culture NO GROWTH 5 DAYS  Final   Report Status 04/17/2015 FINAL  Final  Gram stain     Status: None   Collection Time: 04/12/15  1:55 PM  Result Value Ref Range Status   Specimen Description TRACHEAL SITE  Final   Special Requests NONE  Final   Gram Stain   Final    FEW WBC PRESENT,BOTH PMN AND MONONUCLEAR MODERATE GRAM POSITIVE RODS FEW GRAM POSITIVE COCCI IN PAIRS FEW GRAM  NEGATIVE RODS SQUAMOUS EPITHELIAL CELLS PRESENT    Report Status 04/12/2015 FINAL  Final  Culture, respiratory (NON-Expectorated)     Status: None   Collection Time: 04/12/15  1:55 PM  Result Value Ref Range Status   Specimen Description SPUTUM  Final   Special Requests NONE  Final   Gram Stain   Final    FEW WBC PRESENT,BOTH PMN AND MONONUCLEAR MODERATE SQUAMOUS EPITHELIAL CELLS PRESENT MODERATE GRAM POSITIVE RODS FEW GRAM POSITIVE COCCI IN PAIRS FEW GRAM NEGATIVE RODS Performed at [facility] Performed at [facility]    Culture   Final    NORMAL OROPHARYNGEAL FLORA Performed at [facility]    Report Status 04/15/2015 FINAL  Final  MRSA PCR Screening     Status: None   Collection Time: 04/14/15  7:10 PM  Result Value Ref Range Status   MRSA by PCR NEGATIVE NEGATIVE Final    Comment:        The GeneXpert MRSA Assay (FDA approved for NASAL specimens only), is one component of a comprehensive MRSA colonization surveillance program. It is not intended to diagnose MRSA infection nor to guide or monitor treatment for MRSA infections.   Blood culture (routine x 2)     Status: None (Preliminary result)   Collection Time: 04/20/15 11:37 AM  Result Value Ref Range Status   Specimen Description BLOOD WRIST RIGHT  Final   Special Requests BOTTLES DRAWN AEROBIC ONLY 5CC  Final   Culture NO GROWTH 1 DAY  Final   Report Status PENDING  Incomplete  Blood culture (routine x 2)     Status: None (Preliminary result)   Collection Time: 04/20/15 12:22 PM  Result Value Ref Range Status   Specimen Description BLOOD RIGHT ANTECUBITAL  Final   Special Requests BOTTLES DRAWN AEROBIC AND ANAEROBIC 5CC  Final   Culture NO GROWTH 1 DAY  Final   Report Status PENDING  Incomplete  MRSA PCR Screening  Status: None   Collection Time: 04/21/15  3:51 AM  Result Value Ref Range Status   MRSA by PCR NEGATIVE NEGATIVE Final    Comment:        The GeneXpert MRSA Assay  (FDA approved for NASAL specimens only), is one component of a comprehensive MRSA colonization surveillance program. It is not intended to diagnose MRSA infection nor to guide or monitor treatment for MRSA infections.     RADIOLOGY STUDIES/RESULTS: Dg Chest 1 View  04/13/2015  CLINICAL DATA:  I50.9 (ICD-10-CM) - CHF (congestive heart failure) (HCC) J18.9 (ICD-10-CM) - PNA (pneumonia) cough EXAM: CHEST  1 VIEW COMPARISON:  the previous day's study FINDINGS: Asymmetric interstitial and alveolar opacities most prominent in the right upper lobe as before. Heart size upper limits normal for technique. No definite effusion. No pneumothorax. Visualized skeletal structures are unremarkable. IMPRESSION: 1. Little change in asymmetric infiltrates or edema. Electronically Signed   By: Corlis Leak M.D.   On: 04/13/2015 13:05   Dg Chest 2 View  04/12/2015  CLINICAL DATA:  Shortness of breath and tachycardia EXAM: CHEST  2 VIEW COMPARISON:  July 10, 2014 FINDINGS: There is patchy airspace consolidation throughout much of the right lung, primarily in the right upper lobe anteriorly. Left lung is clear. Heart is upper normal in size with pulmonary vascularity within normal limits. No adenopathy. There are no apparent bone lesions. IMPRESSION: Extensive infiltrate on the right, most notably in the anterior segment right upper lobe. Left lung clear. No adenopathy evident. Followup PA and lateral chest radiographs recommended in 3-4 weeks following trial of antibiotic therapy to ensure resolution and exclude underlying malignancy. Electronically Signed   By: Bretta Bang III M.D.   On: 04/12/2015 09:55   Ct Head Wo Contrast  04/20/2015  CLINICAL DATA:  Weakness. Speech difficulty. Slurred speech. Headache appear EXAM: CT HEAD WITHOUT CONTRAST TECHNIQUE: Contiguous axial images were obtained from the base of the skull through the vertex without intravenous contrast. COMPARISON:  04/04/2015 FINDINGS: Global  atrophy. Chronic ischemic changes in the periventricular white matter. No mass effect, midline shift, or acute hemorrhage. Calcifications of the left lobe with slight shrinking suggest early phthisis bulbi. Visualized paranasal sinuses and mastoid air cells are clear. Cranium is intact. IMPRESSION: No acute intracranial pathology. Electronically Signed   By: Jolaine Click M.D.   On: 04/20/2015 11:42   Ct Head Wo Contrast  04/04/2015  CLINICAL DATA:  Larey Seat last week. Fell from bed with trauma to the head and neck. Occipital region pain. Symptoms worsening. EXAM: CT HEAD WITHOUT CONTRAST CT CERVICAL SPINE WITHOUT CONTRAST TECHNIQUE: Multidetector CT imaging of the head and cervical spine was performed following the standard protocol without intravenous contrast. Multiplanar CT image reconstructions of the cervical spine were also generated. COMPARISON:  04/01/2015 FINDINGS: CT HEAD FINDINGS The brain shows atrophy with chronic small-vessel ischemic changes throughout the white matter, thalami I and pons. No sign of acute infarction, mass lesion, hemorrhage, hydrocephalus or extra-axial collection. No skull fracture. No fluid in the sinuses, middle ears or mastoids. Chronic calcification of the left globe but as previously seen. CT CERVICAL SPINE FINDINGS No evidence of fracture or traumatic malalignment. There chronic degenerative changes at the C1-2 articulation but without encroachment upon the neural spaces. C2-3:  Chronic facet fusion on the left.  No stenosis. C3-4: Facet degeneration on the left. Mild foraminal narrowing. No significant stenosis. C4-5: Spondylosis more prominent towards the right. No significant stenosis. C5-6:  Spondylosis.  No significant stenosis. C6-7:  Spondylosis. Facet degeneration on the left. No significant stenosis. C7-T1:  Mild facet degeneration.  No stenosis. IMPRESSION: Head CT: No acute or traumatic finding. Atrophy and chronic small vessel ischemic changes. Cervical spine CT: No  acute or traumatic finding. Extensive chronic degenerative changes. Electronically Signed   By: Paulina Fusi M.D.   On: 04/04/2015 10:10   Ct Head Wo Contrast  04/01/2015  CLINICAL DATA:  Posttraumatic headache after falling out of bed 2 days ago. No reported loss of consciousness. EXAM: CT HEAD WITHOUT CONTRAST CT CERVICAL SPINE WITHOUT CONTRAST TECHNIQUE: Multidetector CT imaging of the head and cervical spine was performed following the standard protocol without intravenous contrast. Multiplanar CT image reconstructions of the cervical spine were also generated. COMPARISON:  CT scan of head of July 10, 2014. FINDINGS: CT HEAD FINDINGS Bony calvarium appears intact. Mild diffuse cortical atrophy is noted. Mild chronic ischemic white matter disease is noted. No mass effect or midline shift is noted. Ventricular size is within normal limits. There is no evidence of mass lesion, hemorrhage or acute infarction. CT CERVICAL SPINE FINDINGS Reversal of normal lordosis is noted due to degenerative disc disease. Severe degenerative disc disease is noted at C4-5, C5-6 and C6-7 with anterior osteophyte formation. No fracture or spondylolisthesis is noted. Visualized upper lung zones appear normal. IMPRESSION: Mild diffuse cortical atrophy. Mild chronic ischemic white matter disease. No acute intracranial abnormality seen. Severe multilevel degenerative disc disease. No acute abnormality seen in the cervical spine. Electronically Signed   By: Lupita Raider, M.D.   On: 04/01/2015 14:05   Ct Cervical Spine Wo Contrast  04/04/2015  CLINICAL DATA:  Larey Seat last week. Fell from bed with trauma to the head and neck. Occipital region pain. Symptoms worsening. EXAM: CT HEAD WITHOUT CONTRAST CT CERVICAL SPINE WITHOUT CONTRAST TECHNIQUE: Multidetector CT imaging of the head and cervical spine was performed following the standard protocol without intravenous contrast. Multiplanar CT image reconstructions of the cervical spine were  also generated. COMPARISON:  04/01/2015 FINDINGS: CT HEAD FINDINGS The brain shows atrophy with chronic small-vessel ischemic changes throughout the white matter, thalami I and pons. No sign of acute infarction, mass lesion, hemorrhage, hydrocephalus or extra-axial collection. No skull fracture. No fluid in the sinuses, middle ears or mastoids. Chronic calcification of the left globe but as previously seen. CT CERVICAL SPINE FINDINGS No evidence of fracture or traumatic malalignment. There chronic degenerative changes at the C1-2 articulation but without encroachment upon the neural spaces. C2-3:  Chronic facet fusion on the left.  No stenosis. C3-4: Facet degeneration on the left. Mild foraminal narrowing. No significant stenosis. C4-5: Spondylosis more prominent towards the right. No significant stenosis. C5-6:  Spondylosis.  No significant stenosis. C6-7: Spondylosis. Facet degeneration on the left. No significant stenosis. C7-T1:  Mild facet degeneration.  No stenosis. IMPRESSION: Head CT: No acute or traumatic finding. Atrophy and chronic small vessel ischemic changes. Cervical spine CT: No acute or traumatic finding. Extensive chronic degenerative changes. Electronically Signed   By: Paulina Fusi M.D.   On: 04/04/2015 10:10   Ct Cervical Spine Wo Contrast  04/01/2015  CLINICAL DATA:  Posttraumatic headache after falling out of bed 2 days ago. No reported loss of consciousness. EXAM: CT HEAD WITHOUT CONTRAST CT CERVICAL SPINE WITHOUT CONTRAST TECHNIQUE: Multidetector CT imaging of the head and cervical spine was performed following the standard protocol without intravenous contrast. Multiplanar CT image reconstructions of the cervical spine were also generated. COMPARISON:  CT scan of head of July 10, 2014. FINDINGS: CT HEAD FINDINGS Bony calvarium appears intact. Mild diffuse cortical atrophy is noted. Mild chronic ischemic white matter disease is noted. No mass effect or midline shift is noted. Ventricular  size is within normal limits. There is no evidence of mass lesion, hemorrhage or acute infarction. CT CERVICAL SPINE FINDINGS Reversal of normal lordosis is noted due to degenerative disc disease. Severe degenerative disc disease is noted at C4-5, C5-6 and C6-7 with anterior osteophyte formation. No fracture or spondylolisthesis is noted. Visualized upper lung zones appear normal. IMPRESSION: Mild diffuse cortical atrophy. Mild chronic ischemic white matter disease. No acute intracranial abnormality seen. Severe multilevel degenerative disc disease. No acute abnormality seen in the cervical spine. Electronically Signed   By: Lupita Raider, M.D.   On: 04/01/2015 14:05   Dg Chest Port 1 View  04/22/2015  CLINICAL DATA:  Shortness of breath. EXAM: PORTABLE CHEST - 1 VIEW COMPARISON:  One-view chest x-ray 04/21/2015. FINDINGS: The heart is mildly enlarged. The lung volumes are low. A mild diffuse interstitial pattern is similar to the prior study. Right upper lobe airspace disease persists. The visualized soft tissues and bony thorax are unremarkable. IMPRESSION: 1. Cardiomegaly and mild diffuse edema is stable. 2. Asymmetric right upper lobe airspace disease per cysts, concerning for pneumonia. Electronically Signed   By: Marin Roberts M.D.   On: 04/22/2015 07:42   Dg Chest Port 1 View  04/20/2015  CLINICAL DATA:  Weakness for 5 days.  Episodes of hypoxia EXAM: PORTABLE CHEST 1 VIEW COMPARISON:  April 13, 2015 FINDINGS: There is increased airspace consolidation in portions of the right upper and right lower lobes. There is underlying interstitial edema with cardiomegaly and pulmonary venous hypertension. No adenopathy evident. No bone lesions. IMPRESSION: Persistent underlying congestive heart failure. New areas of consolidation throughout portions the right upper and lower lobes. Question alveolar edema versus superimposed pneumonia. Both entities may exist concurrently. Electronically Signed   By:  Bretta Bang III M.D.   On: 04/20/2015 10:35   Dg Chest Port 1v Same Day  04/21/2015  CLINICAL DATA:  X 5 days patient has been severley SOB, Dr states yesterday he had fluid on his lungs per family member. HX HTN, diabetes, asthma, atrial fibrillation EXAM: PORTABLE CHEST 1 VIEW COMPARISON:  04/20/2015 FINDINGS: Scattered airspace opacities throughout the right lung and at the left lung base, slightly improved since previous day's exam. Heart size upper limits normal for technique. No pneumothorax. No effusion. Regional bones unremarkable. IMPRESSION: 1. Slight improvement in asymmetric infiltrates or edema. Electronically Signed   By: Corlis Leak M.D.   On: 04/21/2015 09:37    Jeoffrey Massed, MD  [facility] Pager:[phone]  If 7PM-7AM, please contact night-coverage [email or web address] Password [facility] 04/22/2015, 12:55 PM   LOS: 2 days

## 2015-04-23 ENCOUNTER — Other Ambulatory Visit (HOSPITAL_COMMUNITY): Payer: Medicare Other

## 2015-04-23 LAB — CBC
HCT: 25.5 % — ABNORMAL LOW (ref 39.0–52.0)
HEMOGLOBIN: 8.4 g/dL — AB (ref 13.0–17.0)
MCH: 30.2 pg (ref 26.0–34.0)
MCHC: 32.9 g/dL (ref 30.0–36.0)
MCV: 91.7 fL (ref 78.0–100.0)
Platelets: 258 10*3/uL (ref 150–400)
RBC: 2.78 MIL/uL — AB (ref 4.22–5.81)
RDW: 15.1 % (ref 11.5–15.5)
WBC: 6.5 10*3/uL (ref 4.0–10.5)

## 2015-04-23 LAB — RENAL FUNCTION PANEL
ANION GAP: 12 (ref 5–15)
Albumin: 2.3 g/dL — ABNORMAL LOW (ref 3.5–5.0)
BUN: 28 mg/dL — ABNORMAL HIGH (ref 6–20)
CHLORIDE: 92 mmol/L — AB (ref 101–111)
CO2: 25 mmol/L (ref 22–32)
Calcium: 9.9 mg/dL (ref 8.9–10.3)
Creatinine, Ser: 7.73 mg/dL — ABNORMAL HIGH (ref 0.61–1.24)
GFR calc non Af Amer: 6 mL/min — ABNORMAL LOW (ref >60)
GFR, EST AFRICAN AMERICAN: 7 mL/min — AB (ref >60)
Glucose, Bld: 107 mg/dL — ABNORMAL HIGH (ref 65–99)
Phosphorus: 4.6 mg/dL (ref 2.5–4.6)
Potassium: 5 mmol/L (ref 3.5–5.1)
Sodium: 129 mmol/L — ABNORMAL LOW (ref 135–145)

## 2015-04-23 LAB — PROTIME-INR
INR: 2.61 — AB (ref 0.00–1.49)
PROTHROMBIN TIME: 27.6 s — AB (ref 11.6–15.2)

## 2015-04-23 LAB — GLUCOSE, CAPILLARY: Glucose-Capillary: 111 mg/dL — ABNORMAL HIGH (ref 65–99)

## 2015-04-23 MED ORDER — WARFARIN SODIUM 5 MG PO TABS: 5.0000 mg | ORAL_TABLET | Freq: Once | ORAL | Status: DC

## 2015-04-23 MED ORDER — AMOXICILLIN-POT CLAVULANATE 500-125 MG PO TABS: 1.0000 | ORAL_TABLET | Freq: Every day | ORAL | Status: DC

## 2015-04-23 MED ORDER — AMOXICILLIN-POT CLAVULANATE 500-125 MG PO TABS: 1.0000 | ORAL_TABLET | Freq: Three times a day (TID) | ORAL | Status: DC

## 2015-04-23 NOTE — Progress Notes (Signed)
[facility] Progress Note   Subjective: feeling better  Filed Vitals:   04/23/15 0655 04/23/15 0659 04/23/15 0710 04/23/15 0730  BP: 151/76 153/76 137/76 128/64  Pulse: 63 64 64 66  Temp: 97.4 F (36.3 C)     TempSrc: Oral     Resp: 16     Height:      Weight: 100.5 kg (221 lb 9 oz)     SpO2: 98%       Inpatient medications: . amiodarone  400 mg Oral Daily  . amLODipine  10 mg Oral QHS  . aspirin EC  81 mg Oral Daily  . atropine  1 drop Left Eye q12n4p  . calcium acetate  1,334 mg Oral TID WC  . ceFEPime (MAXIPIME) IV  2 g Intravenous Q M,W,F-1800  . cinacalcet  60 mg Oral QHS  . darbepoetin (ARANESP) injection - DIALYSIS  60 mcg Intravenous Q Wed-HD  . dorzolamide-timolol  1 drop Right Eye BID  . latanoprost  1 drop Right Eye QHS  . prednisoLONE acetate  1 drop Left Eye BID  . simvastatin  20 mg Oral QPM  . sodium chloride flush  3 mL Intravenous Q12H  . tobramycin-dexamethasone  1 drop Left Eye QID  . Warfarin - Pharmacist Dosing Inpatient   Does not apply q1800   . dextrose 5 % and 0.9 % NaCl with KCl 40 mEq/L 10 mL/hr at 04/21/15 0100   acetaminophen, calcium acetate, ondansetron **OR** ondansetron (ZOFRAN) IV  Exam: Alert, no distress, up in chair +jvd Chest bilat scattered rales lower lung fields, occ rhonchi Irreg rhythm, no RG Abd soft ntnd no mass or ascites Ext trace edema only Darkening of skin bilat feet, no ulcers Neuro alert, nf, gen'd weak  F/U CXR - persistent RUL density, pulm edema resolved for the most part  Dialysis: [facility] MWF 4h 104kg 3/2.25 bath LUA AVF Hep 4200 8.3/ 48%/ -- > mircera 75 q2 last 3/8 9.2/ 4.9/ 229 pth > hect 5, phoslo 2ac, sensipar 60 BP > norvasc 10/ valsartan 80      Assessment: 1 Gen weakness - due to #1, #2 and deconditioning. For SNF placement 2 Pulm edema - improved by CXR 3 PNA - on cefipime per primary 4 ESRD MWF HD 5 Afib on amio/ coumadin 6 HTN bp's down, holding all bp meds 7 MBD -  cont meds 8 Anemia - Hb 8-9 cont esa 9 MBD - Ca high, dc'd vit D 10 DM - on glipizide at home w low BS's early am per wife  Plan - HD , SNF.    Vinson Moselle MD [facility] pager [phone]    cell [phone] 04/23/2015, 8:48 AM    Recent Labs Lab 04/21/15 0650 04/22/15 0615 04/23/15 0708  NA 138 134* 129*  K 4.4 4.6 5.0  CL 97* 95* 92*  CO2 GLUCOSE 100* 101* 107*  BUN 20 14 28*  CREATININE 6.06* 4.86* 7.73*  CALCIUM 9.2 9.1 9.9  PHOS  --  3.4 4.6    Recent Labs Lab 04/21/15 0650 04/22/15 0615 04/23/15 0708  AST 22  --   --   ALT 22  --   --   ALKPHOS 50  --   --   BILITOT 0.7  --   --   PROT 7.0  --   --   ALBUMIN 2.3* 2.2* 2.3*    Recent Labs Lab 04/21/15 0650 04/22/15 0615 04/23/15 0708  WBC 6.3 6.5  6.5  HGB 7.7* 8.1* 8.4*  HCT 23.6* 26.2* 25.5*  MCV 93.7 93.9 91.7  PLT 235 267 258

## 2015-04-23 NOTE — Clinical Social Work Note (Signed)
CSW spoke with patient's wife to review discharge plans.  Wife is agreeable. Patient just returned to the room from HD and has eaten.  Transport will be called at 12:30.  RN aware and agreeable.  Patient will discharge today per MD order. [facility] RN to call report prior to transportation to: [phone] Transportation: [facility] to be called at 12:30pm  CSW sent discharge summary to SNF for review.  RN, patient and family aware of discharge plans.  Vickii PennaGina Chandon Lazcano, LCSW [phone]  5N1-9, 2S 15-16 and Psychiatric Service Line  Licensed Clinical Social Worker

## 2015-04-23 NOTE — Discharge Summary (Signed)
PATIENT DETAILS Name: [name] [name] Age: 82 y.o. Sex: male Date of Birth: 27-Nov-1933 MRN: [id number]. Admitting Physician: Briscoe Deutscher, MD ZDG:[id number],VQQVZD D, MD  Admit Date: 04/20/2015 Discharge date: 04/23/2015  Recommendations for Outpatient Follow-up:  1. Ensure follow-up with cardiology-C appointments below 2. Continue outpatient hemodialysis-Monday, Wednesday and Fridays 3. On chronic Coumadin-he requires periodic INR check.  4. Please repeat CBC/BMET in 1 week 5. Please repeat chest x-ray in 3-4 weeks to document resolution of pneumonia.  PRIMARY DISCHARGE DIAGNOSIS:  Active Problems:   Blind left eye   ESRD (end stage renal disease) (HCC)   HCAP (healthcare-associated pneumonia)   PAF (paroxysmal atrial fibrillation) (HCC)   Encephalopathy   Physical deconditioning   Diabetes mellitus with complication (HCC)      PAST MEDICAL HISTORY: Past Medical History  Diagnosis Date  . Diabetes mellitus without complication (HCC)   . Atrial fibrillation (HCC)   . Hypertension   . Glaucoma   . Peripheral neuropathy (HCC)   . Gout   . Anemia   . ESRD (end stage renal disease) on dialysis [facility])     "MWF; [address]" (04/08/2015)    DISCHARGE MEDICATIONS: Current Discharge Medication List    START taking these medications   Details  amoxicillin-clavulanate (AUGMENTIN) 500-125 MG tablet Take 1 tablet (500 mg total) by mouth daily. For 4 more days from 3/24      CONTINUE these medications which have NOT CHANGED   Details  atropine 1 % ophthalmic solution Place 1 drop into the left eye 2 times daily at 12 noon and 4 pm.    dorzolamide-timolol (COSOPT) 22.3-6.8 MG/ML ophthalmic solution Place 1 drop into the right eye 2 (two) times daily.     prednisoLONE acetate (PRED FORTE) 1 % ophthalmic suspension Place 1 drop into the left eye 2 (two) times daily. Patient to use for 37 days. Filled on 03-28-15    warfarin (COUMADIN) 5 MG tablet Take 7.5 mg daily,  except on M, W, F . On M, W, F take 5 mg by mouth. Qty: 50 tablet, Refills: 3    acetaminophen (TYLENOL) 325 MG tablet Take 650 mg by mouth daily as needed for mild pain.    albuterol (PROVENTIL HFA;VENTOLIN HFA) 108 (90 Base) MCG/ACT inhaler Inhale 1-2 puffs into the lungs every 6 (six) hours as needed for wheezing or shortness of breath.     amiodarone (PACERONE) 200 MG tablet Take 1 tablet (200 mg total) by mouth daily. Qty: 30 tablet, Refills: 0    Calcium Acetate 667 MG TABS Take 1-2 capsules by mouth 3 (three) times daily. 667 mg for snacks, 1334 mg for meals    docusate sodium (COLACE) 100 MG capsule Take 100 mg by mouth 2 (two) times daily.    folic acid-vitamin b complex-vitamin c-selenium-zinc (DIALYVITE) 3 MG TABS tablet Take 1 tablet by mouth daily.    lidocaine-prilocaine (EMLA) cream Apply 1 application topically as needed (apply to skin near dialysis port before dialysis).     Multiple Vitamin (MULTIVITAMIN WITH MINERALS) TABS tablet Take 1 tablet by mouth daily. Qty: 30 tablet, Refills: 0    SENSIPAR 60 MG tablet Take 60 mg by mouth at bedtime.     simvastatin (ZOCOR) 20 MG tablet Take 20 mg by mouth every evening.    tobramycin-dexamethasone (TOBRADEX) ophthalmic solution Place 1 drop into the left eye 4 (four) times daily. For 37 days. Filled 03-31-15    TRAVATAN Z 0.004 % SOLN ophthalmic solution  Place 1 drop into the right eye at bedtime.       STOP taking these medications     aspirin EC 81 MG tablet      amLODipine (NORVASC) 10 MG tablet      valsartan (DIOVAN) 160 MG tablet         ALLERGIES:   Allergies  Allergen Reactions  . Diltiazem Other (See Comments)    SEVERE STOMACH CRAMPING PER PT    BRIEF HPI:  See H&P, Labs, Consult and Test reports for all details in brief, patient Is a 82 year old male with history of end-stage renal disease, who was brought in for shortness of breath, confusion and worsening generalized weakness.  CONSULTATIONS:     nephrology  PERTINENT RADIOLOGIC STUDIES: Dg Chest 1 View  04/13/2015  CLINICAL DATA:  I50.9 (ICD-10-CM) - CHF (congestive heart failure) (HCC) J18.9 (ICD-10-CM) - PNA (pneumonia) cough EXAM: CHEST  1 VIEW COMPARISON:  the previous day's study FINDINGS: Asymmetric interstitial and alveolar opacities most prominent in the right upper lobe as before. Heart size upper limits normal for technique. No definite effusion. No pneumothorax. Visualized skeletal structures are unremarkable. IMPRESSION: 1. Little change in asymmetric infiltrates or edema. Electronically Signed   By: Corlis Leak M.D.   On: 04/13/2015 13:05   Dg Chest 2 View  04/12/2015  CLINICAL DATA:  Shortness of breath and tachycardia EXAM: CHEST  2 VIEW COMPARISON:  July 10, 2014 FINDINGS: There is patchy airspace consolidation throughout much of the right lung, primarily in the right upper lobe anteriorly. Left lung is clear. Heart is upper normal in size with pulmonary vascularity within normal limits. No adenopathy. There are no apparent bone lesions. IMPRESSION: Extensive infiltrate on the right, most notably in the anterior segment right upper lobe. Left lung clear. No adenopathy evident. Followup PA and lateral chest radiographs recommended in 3-4 weeks following trial of antibiotic therapy to ensure resolution and exclude underlying malignancy. Electronically Signed   By: Bretta Bang III M.D.   On: 04/12/2015 09:55   Ct Head Wo Contrast  04/20/2015  CLINICAL DATA:  Weakness. Speech difficulty. Slurred speech. Headache appear EXAM: CT HEAD WITHOUT CONTRAST TECHNIQUE: Contiguous axial images were obtained from the base of the skull through the vertex without intravenous contrast. COMPARISON:  04/04/2015 FINDINGS: Global atrophy. Chronic ischemic changes in the periventricular white matter. No mass effect, midline shift, or acute hemorrhage. Calcifications of the left lobe with slight shrinking suggest early phthisis bulbi. Visualized  paranasal sinuses and mastoid air cells are clear. Cranium is intact. IMPRESSION: No acute intracranial pathology. Electronically Signed   By: Jolaine Click M.D.   On: 04/20/2015 11:42   Ct Head Wo Contrast  04/04/2015  CLINICAL DATA:  Larey Seat last week. Fell from bed with trauma to the head and neck. Occipital region pain. Symptoms worsening. EXAM: CT HEAD WITHOUT CONTRAST CT CERVICAL SPINE WITHOUT CONTRAST TECHNIQUE: Multidetector CT imaging of the head and cervical spine was performed following the standard protocol without intravenous contrast. Multiplanar CT image reconstructions of the cervical spine were also generated. COMPARISON:  04/01/2015 FINDINGS: CT HEAD FINDINGS The brain shows atrophy with chronic small-vessel ischemic changes throughout the white matter, thalami I and pons. No sign of acute infarction, mass lesion, hemorrhage, hydrocephalus or extra-axial collection. No skull fracture. No fluid in the sinuses, middle ears or mastoids. Chronic calcification of the left globe but as previously seen. CT CERVICAL SPINE FINDINGS No evidence of fracture or traumatic malalignment. There chronic degenerative changes at  the C1-2 articulation but without encroachment upon the neural spaces. C2-3:  Chronic facet fusion on the left.  No stenosis. C3-4: Facet degeneration on the left. Mild foraminal narrowing. No significant stenosis. C4-5: Spondylosis more prominent towards the right. No significant stenosis. C5-6:  Spondylosis.  No significant stenosis. C6-7: Spondylosis. Facet degeneration on the left. No significant stenosis. C7-T1:  Mild facet degeneration.  No stenosis. IMPRESSION: Head CT: No acute or traumatic finding. Atrophy and chronic small vessel ischemic changes. Cervical spine CT: No acute or traumatic finding. Extensive chronic degenerative changes. Electronically Signed   By: Paulina Fusi M.D.   On: 04/04/2015 10:10   Ct Head Wo Contrast  04/01/2015  CLINICAL DATA:  Posttraumatic headache after  falling out of bed 2 days ago. No reported loss of consciousness. EXAM: CT HEAD WITHOUT CONTRAST CT CERVICAL SPINE WITHOUT CONTRAST TECHNIQUE: Multidetector CT imaging of the head and cervical spine was performed following the standard protocol without intravenous contrast. Multiplanar CT image reconstructions of the cervical spine were also generated. COMPARISON:  CT scan of head of July 10, 2014. FINDINGS: CT HEAD FINDINGS Bony calvarium appears intact. Mild diffuse cortical atrophy is noted. Mild chronic ischemic white matter disease is noted. No mass effect or midline shift is noted. Ventricular size is within normal limits. There is no evidence of mass lesion, hemorrhage or acute infarction. CT CERVICAL SPINE FINDINGS Reversal of normal lordosis is noted due to degenerative disc disease. Severe degenerative disc disease is noted at C4-5, C5-6 and C6-7 with anterior osteophyte formation. No fracture or spondylolisthesis is noted. Visualized upper lung zones appear normal. IMPRESSION: Mild diffuse cortical atrophy. Mild chronic ischemic white matter disease. No acute intracranial abnormality seen. Severe multilevel degenerative disc disease. No acute abnormality seen in the cervical spine. Electronically Signed   By: Lupita Raider, M.D.   On: 04/01/2015 14:05   Ct Cervical Spine Wo Contrast  04/04/2015  CLINICAL DATA:  Larey Seat last week. Fell from bed with trauma to the head and neck. Occipital region pain. Symptoms worsening. EXAM: CT HEAD WITHOUT CONTRAST CT CERVICAL SPINE WITHOUT CONTRAST TECHNIQUE: Multidetector CT imaging of the head and cervical spine was performed following the standard protocol without intravenous contrast. Multiplanar CT image reconstructions of the cervical spine were also generated. COMPARISON:  04/01/2015 FINDINGS: CT HEAD FINDINGS The brain shows atrophy with chronic small-vessel ischemic changes throughout the white matter, thalami I and pons. No sign of acute infarction, mass  lesion, hemorrhage, hydrocephalus or extra-axial collection. No skull fracture. No fluid in the sinuses, middle ears or mastoids. Chronic calcification of the left globe but as previously seen. CT CERVICAL SPINE FINDINGS No evidence of fracture or traumatic malalignment. There chronic degenerative changes at the C1-2 articulation but without encroachment upon the neural spaces. C2-3:  Chronic facet fusion on the left.  No stenosis. C3-4: Facet degeneration on the left. Mild foraminal narrowing. No significant stenosis. C4-5: Spondylosis more prominent towards the right. No significant stenosis. C5-6:  Spondylosis.  No significant stenosis. C6-7: Spondylosis. Facet degeneration on the left. No significant stenosis. C7-T1:  Mild facet degeneration.  No stenosis. IMPRESSION: Head CT: No acute or traumatic finding. Atrophy and chronic small vessel ischemic changes. Cervical spine CT: No acute or traumatic finding. Extensive chronic degenerative changes. Electronically Signed   By: Paulina Fusi M.D.   On: 04/04/2015 10:10   Ct Cervical Spine Wo Contrast  04/01/2015  CLINICAL DATA:  Posttraumatic headache after falling out of bed 2 days ago. No reported  loss of consciousness. EXAM: CT HEAD WITHOUT CONTRAST CT CERVICAL SPINE WITHOUT CONTRAST TECHNIQUE: Multidetector CT imaging of the head and cervical spine was performed following the standard protocol without intravenous contrast. Multiplanar CT image reconstructions of the cervical spine were also generated. COMPARISON:  CT scan of head of July 10, 2014. FINDINGS: CT HEAD FINDINGS Bony calvarium appears intact. Mild diffuse cortical atrophy is noted. Mild chronic ischemic white matter disease is noted. No mass effect or midline shift is noted. Ventricular size is within normal limits. There is no evidence of mass lesion, hemorrhage or acute infarction. CT CERVICAL SPINE FINDINGS Reversal of normal lordosis is noted due to degenerative disc disease. Severe degenerative  disc disease is noted at C4-5, C5-6 and C6-7 with anterior osteophyte formation. No fracture or spondylolisthesis is noted. Visualized upper lung zones appear normal. IMPRESSION: Mild diffuse cortical atrophy. Mild chronic ischemic white matter disease. No acute intracranial abnormality seen. Severe multilevel degenerative disc disease. No acute abnormality seen in the cervical spine. Electronically Signed   By: Lupita Raider, M.D.   On: 04/01/2015 14:05   Dg Chest Port 1 View  04/22/2015  CLINICAL DATA:  Shortness of breath. EXAM: PORTABLE CHEST - 1 VIEW COMPARISON:  One-view chest x-ray 04/21/2015. FINDINGS: The heart is mildly enlarged. The lung volumes are low. A mild diffuse interstitial pattern is similar to the prior study. Right upper lobe airspace disease persists. The visualized soft tissues and bony thorax are unremarkable. IMPRESSION: 1. Cardiomegaly and mild diffuse edema is stable. 2. Asymmetric right upper lobe airspace disease per cysts, concerning for pneumonia. Electronically Signed   By: Marin Roberts M.D.   On: 04/22/2015 07:42   Dg Chest Port 1 View  04/20/2015  CLINICAL DATA:  Weakness for 5 days.  Episodes of hypoxia EXAM: PORTABLE CHEST 1 VIEW COMPARISON:  April 13, 2015 FINDINGS: There is increased airspace consolidation in portions of the right upper and right lower lobes. There is underlying interstitial edema with cardiomegaly and pulmonary venous hypertension. No adenopathy evident. No bone lesions. IMPRESSION: Persistent underlying congestive heart failure. New areas of consolidation throughout portions the right upper and lower lobes. Question alveolar edema versus superimposed pneumonia. Both entities may exist concurrently. Electronically Signed   By: Bretta Bang III M.D.   On: 04/20/2015 10:35   Dg Chest Port 1v Same Day  04/21/2015  CLINICAL DATA:  X 5 days patient has been severley SOB, Dr states yesterday he had fluid on his lungs per family member. HX  HTN, diabetes, asthma, atrial fibrillation EXAM: PORTABLE CHEST 1 VIEW COMPARISON:  04/20/2015 FINDINGS: Scattered airspace opacities throughout the right lung and at the left lung base, slightly improved since previous day's exam. Heart size upper limits normal for technique. No pneumothorax. No effusion. Regional bones unremarkable. IMPRESSION: 1. Slight improvement in asymmetric infiltrates or edema. Electronically Signed   By: Corlis Leak M.D.   On: 04/21/2015 09:37     PERTINENT LAB RESULTS: CBC:  Recent Labs  04/22/15 0615 04/23/15 0708  WBC 6.5 6.5  HGB 8.1* 8.4*  HCT 26.2* 25.5*  PLT 267 258   CMET CMP     Component Value Date/Time   NA 129* 04/23/2015 0708   K 5.0 04/23/2015 0708   CL 92* 04/23/2015 0708   CO2 25 04/23/2015 0708   GLUCOSE 107* 04/23/2015 0708   BUN 28* 04/23/2015 0708   CREATININE 7.73* 04/23/2015 0708   CALCIUM 9.9 04/23/2015 0708   CALCIUM 8.5 01/27/2010 1327  PROT 7.0 04/21/2015 0650   ALBUMIN 2.3* 04/23/2015 0708   AST 22 04/21/2015 0650   ALT 22 04/21/2015 0650   ALKPHOS 50 04/21/2015 0650   BILITOT 0.7 04/21/2015 0650   GFRNONAA 6* 04/23/2015 0708   GFRAA 7* 04/23/2015 0708    GFR Estimated Creatinine Clearance: 9.5 mL/min (by C-G formula based on Cr of 7.73). No results for input(s): LIPASE, AMYLASE in the last 72 hours. No results for input(s): CKTOTAL, CKMB, CKMBINDEX, TROPONINI in the last 72 hours. Invalid input(s): POCBNP No results for input(s): DDIMER in the last 72 hours. No results for input(s): HGBA1C in the last 72 hours. No results for input(s): CHOL, HDL, LDLCALC, TRIG, CHOLHDL, LDLDIRECT in the last 72 hours. No results for input(s): TSH, T4TOTAL, T3FREE, THYROIDAB in the last 72 hours.  Invalid input(s): FREET3 No results for input(s): VITAMINB12, FOLATE, FERRITIN, TIBC, IRON, RETICCTPCT in the last 72 hours. Coags:  Recent Labs  04/22/15 0615 04/23/15 0533  INR 2.35* 2.61*   Microbiology: Recent Results (from  the past 240 hour(s))  MRSA PCR Screening     Status: None   Collection Time: 04/14/15  7:10 PM  Result Value Ref Range Status   MRSA by PCR NEGATIVE NEGATIVE Final    Comment:        The GeneXpert MRSA Assay (FDA approved for NASAL specimens only), is one component of a comprehensive MRSA colonization surveillance program. It is not intended to diagnose MRSA infection nor to guide or monitor treatment for MRSA infections.   Blood culture (routine x 2)     Status: None (Preliminary result)   Collection Time: 04/20/15 11:37 AM  Result Value Ref Range Status   Specimen Description BLOOD WRIST RIGHT  Final   Special Requests BOTTLES DRAWN AEROBIC ONLY 5CC  Final   Culture NO GROWTH 1 DAY  Final   Report Status PENDING  Incomplete  Blood culture (routine x 2)     Status: None (Preliminary result)   Collection Time: 04/20/15 12:22 PM  Result Value Ref Range Status   Specimen Description BLOOD RIGHT ANTECUBITAL  Final   Special Requests BOTTLES DRAWN AEROBIC AND ANAEROBIC 5CC  Final   Culture NO GROWTH 1 DAY  Final   Report Status PENDING  Incomplete  MRSA PCR Screening     Status: None   Collection Time: 04/21/15  3:51 AM  Result Value Ref Range Status   MRSA by PCR NEGATIVE NEGATIVE Final    Comment:        The GeneXpert MRSA Assay (FDA approved for NASAL specimens only), is one component of a comprehensive MRSA colonization surveillance program. It is not intended to diagnose MRSA infection nor to guide or monitor treatment for MRSA infections.      BRIEF HOSPITAL COURSE:  Acute hypoxemic respiratory failure: Suspect from noncardiogenic pulmonary edema in a setting of hemodialysis vs HCAP. Chest x-ray with only some improvement post dialysis x 2, suspect does have a component of PNA. Now on room air. Consider echocardiogram to be done in the outpatient setting.  HCAP:Initally thought to have Pul edema in a setting of HD-however CXR continues to shows mostly right  sided infiltrates even after HD X 2-also procalcitonin high-so suspect did have some PNA component. Empirically started on vancomycin and cefepime,since blood cx and MRSA PCR negative, Vancomycin was discontinued on 3/23, and cefepime was continued. Since clinically improving, we will transition to Augmentin for 4 more days. Please consider repeating chest x-ray in 3-4 weeks to  document resolution of pneumonia.   Acute encephalopathy: Resolved, I suspect this was secondary to pneumonia, hypoglycemia and perhaps hypoxia. Completely awake and alert at the time of discharge.  ESRD: On hemodialysis MWF-was followed by nephrology throughout this hospital course. Please ensure patient continues with outpatient hemodialysis.  Atrial fibrillation: Sinus rhythm, continue amiodarone and Coumadin. INR therapeutic at 2.61 on discharge. Will need periodic INR checks while at SNF. Please ensure follow-up with cardiology.  Type 2 diabetes: Apparently was also getting hypoglycemic at home, also had a few hypoglycemic episodes during this hospital stay. All insulin products was subsequently discontinued. Not resumed on oral hypoglycemic agents. CBGs have been stable for the past 24-48 hours. A1c at 6.3, suspect patient could be monitored off all agents for now. Consider restarting oral agents with low propensity for hypoglycemia if and when CBG start getting significantly more elevated.  Hypertension: Currently off all antihypertensive medications-as blood pressure soft during this hospital stay. Continue to follow and resume when able.   Anemia: Likely secondary to end-stage renal disease, no indication of blood loss. Nephrology managing Aranesp and iron Follow CBC closely  Physical deconditioning: Likely chronic issue, made worse by acute illness. PT evaluation completed-recommendations are for SNF on discharge. Patient is agreeable.   Blind left eye   TODAY-DAY OF DISCHARGE:  Subjective:   [name]  today has no headache,no chest abdominal pain,no new weakness tingling or numbness, feels much better wants to go home today.  Objective:   Blood pressure 118/64, pulse 77, temperature 97.4 F (36.3 C), temperature source Oral, resp. rate 20, height 6\' 4"  (1.93 m), weight 100.5 kg (221 lb 9 oz), SpO2 98 %.  Intake/Output Summary (Last 24 hours) at 04/23/15 1015 Last data filed at 04/23/15 0600  Gross per 24 hour  Intake    660 ml  Output      0 ml  Net    660 ml   Filed Weights   04/21/15 1740 04/21/15 2004 04/23/15 0655  Weight: 98.3 kg (216 lb 11.4 oz) 98.1 kg (216 lb 4.3 oz) 100.5 kg (221 lb 9 oz)    Exam Awake Alert, Oriented *3, No new F.N deficits, Normal affect [place].AT,PERRAL Supple Neck,No JVD, No cervical lymphadenopathy appriciated.  Symmetrical Chest wall movement, Good air movement bilaterally, CTAB RRR,No Gallops,Rubs or new Murmurs, No Parasternal Heave +ve B.Sounds, Abd Soft, Non tender, No organomegaly appriciated, No rebound -guarding or rigidity. No Cyanosis, Clubbing or edema, No new Rash or bruise  DISCHARGE CONDITION: Stable  DISPOSITION: SNF  DISCHARGE INSTRUCTIONS:    Activity:  As tolerated with Full fall precautions use walker/cane & assistance as needed  Get Medicines reviewed and adjusted: Please take all your medications with you for your next visit with your Primary MD  Please request your Primary MD to go over all hospital tests and procedure/radiological results at the follow up, please ask your Primary MD to get all Hospital records sent to his/her office.  If you experience worsening of your admission symptoms, develop shortness of breath, life threatening emergency, suicidal or homicidal thoughts you must seek medical attention immediately by calling 911 or calling your MD immediately  if symptoms less severe.  You must read complete instructions/literature along with all the possible adverse reactions/side effects for all the Medicines you  take and that have been prescribed to you. Take any new Medicines after you have completely understood and accpet all the possible adverse reactions/side effects.   Do not drive when taking Pain medications.  Do not take more than prescribed Pain, Sleep and Anxiety Medications  Special Instructions: If you have smoked or chewed Tobacco  in the last 2 yrs please stop smoking, stop any regular Alcohol  and or any Recreational drug use.  Wear Seat belts while driving.  Please note  You were cared for by a hospitalist during your hospital stay. Once you are discharged, your primary care physician will handle any further medical issues. Please note that NO REFILLS for any discharge medications will be authorized once you are discharged, as it is imperative that you return to your primary care physician (or establish a relationship with a primary care physician if you do not have one) for your aftercare needs so that they can reassess your need for medications and monitor your lab values.   Diet recommendation: Diabetic Diet Heart Healthy diet  Discharge Instructions    Call MD for:  difficulty breathing, headache or visual disturbances    Complete by:  As directed      Call MD for:  persistant dizziness or light-headedness    Complete by:  As directed      Diet - low sodium heart healthy    Complete by:  As directed      Diet Carb Modified    Complete by:  As directed      Increase activity slowly    Complete by:  As directed            Follow-up Information    Follow up with POLITE,RONALD D, MD. Schedule an appointment as soon as possible for a visit in 1 week.   Specialty:  Internal Medicine   Why:  Hospital follow up   Contact information:   [address] [place] [address] [phone]       Follow up with Jacolyn Reedy, PA-C On 04/26/2015.   Specialty:  Cardiology   Why:  Hospital follow up-appt at 10:15   Contact information:   [address] [place] [address] [phone]       Follow up with Lesleigh Noe, MD On 05/13/2015.   Specialty:  Cardiology   Why:  appt at 9:45   Contact information:   [address] [place] [address] [phone]       Please follow up.   Contact information:   Dialysis Center-at your usual schedule-Monday, Wednesday and Fridays     Total Time spent on discharge equals  45 minutes.  SignedJeoffrey Massed 04/23/2015 10:15 AM

## 2015-04-23 NOTE — Progress Notes (Deleted)
[facility] Progress Note   Subjective: feeling better  Filed Vitals:   04/23/15 0655 04/23/15 0659 04/23/15 0710 04/23/15 0730  BP: 151/76 153/76 137/76 128/64  Pulse: 63 64 64 66  Temp: 97.4 F (36.3 C)     TempSrc: Oral     Resp: 16     Height:      Weight: 100.5 kg (221 lb 9 oz)     SpO2: 98%       Inpatient medications: . amiodarone  400 mg Oral Daily  . amLODipine  10 mg Oral QHS  . aspirin EC  81 mg Oral Daily  . atropine  1 drop Left Eye q12n4p  . calcium acetate  1,334 mg Oral TID WC  . ceFEPime (MAXIPIME) IV  2 g Intravenous Q M,W,F-1800  . cinacalcet  60 mg Oral QHS  . darbepoetin (ARANESP) injection - DIALYSIS  60 mcg Intravenous Q Wed-HD  . dorzolamide-timolol  1 drop Right Eye BID  . latanoprost  1 drop Right Eye QHS  . prednisoLONE acetate  1 drop Left Eye BID  . simvastatin  20 mg Oral QPM  . sodium chloride flush  3 mL Intravenous Q12H  . tobramycin-dexamethasone  1 drop Left Eye QID  . Warfarin - Pharmacist Dosing Inpatient   Does not apply q1800   . dextrose 5 % and 0.9 % NaCl with KCl 40 mEq/L 10 mL/hr at 04/21/15 0100   acetaminophen, calcium acetate, ondansetron **OR** ondansetron (ZOFRAN) IV  Exam: Alert, no distress, up in chair +jvd Chest bilat scattered rales lower lung fields, occ rhonchi Irreg rhythm, no RG Abd soft ntnd no mass or ascites Ext trace edema only Darkening of skin bilat feet, no ulcers Neuro alert, nf, gen'd weak  F/U CXR - persistent RUL density, pulm edema resolved for the most part  Dialysis: [facility] MWF 4h 104kg 3/2.25 bath LUA AVF Hep 4200 8.3/ 48%/ -- > mircera 75 q2 last 3/8 9.2/ 4.9/ 229 pth > hect 5, phoslo 2ac, sensipar 60 BP > norvasc 10/ valsartan 80      Assessment: 1 Gen weakness - due to #1, #2 and deconditioning. For SNF placement 2 Pulm edema - improved by CXR 3 PNA - on cefipime per primary 4 ESRD MWF HD 5 Afib on amio/ coumadin 6 HTN bp's down, holding all bp meds 7 MBD -  cont meds 8 Anemia - Hb 8-9 cont esa 9 MBD - Ca high, dc'd vit D 10 DM - on glipizide at home w low BS's early am per wife  Plan - HD , SNF.    Vinson Moselle MD [facility] pager [phone]    cell [phone] 04/23/2015, 8:45 AM    Recent Labs Lab 04/21/15 0650 04/22/15 0615 04/23/15 0708  NA 138 134* 129*  K 4.4 4.6 5.0  CL 97* 95* 92*  CO2 GLUCOSE 100* 101* 107*  BUN 20 14 28*  CREATININE 6.06* 4.86* 7.73*  CALCIUM 9.2 9.1 9.9  PHOS  --  3.4 4.6    Recent Labs Lab 04/21/15 0650 04/22/15 0615 04/23/15 0708  AST 22  --   --   ALT 22  --   --   ALKPHOS 50  --   --   BILITOT 0.7  --   --   PROT 7.0  --   --   ALBUMIN 2.3* 2.2* 2.3*    Recent Labs Lab 04/21/15 0650 04/22/15 0615 04/23/15 0708  WBC 6.3 6.5  6.5  HGB 7.7* 8.1* 8.4*  HCT 23.6* 26.2* 25.5*  MCV 93.7 93.9 91.7  PLT 235 267 258

## 2015-04-23 NOTE — Progress Notes (Signed)
Patient Discharge:  Disposition: [facility]   [facility] and answered all her questions. IV: Discontinued IV before discharge. Telemetry: Discontinued Tele before discharge, CCMD notified. Transportation: Patient transported via EMS. Belongings:  Patient took all his belongings with him.

## 2015-04-26 ENCOUNTER — Encounter: Payer: Medicare Other | Admitting: Physician Assistant

## 2015-04-26 LAB — CULTURE, BLOOD (ROUTINE X 2)
CULTURE: NO GROWTH
CULTURE: NO GROWTH

## 2015-05-13 ENCOUNTER — Ambulatory Visit: Payer: Medicare Other | Admitting: Interventional Cardiology

## 2015-05-25 ENCOUNTER — Ambulatory Visit: Payer: Medicare Other | Admitting: Cardiology

## 2015-05-27 ENCOUNTER — Other Ambulatory Visit: Payer: Self-pay | Admitting: Internal Medicine

## 2015-05-27 ENCOUNTER — Ambulatory Visit
Admission: RE | Admit: 2015-05-27 | Discharge: 2015-05-27 | Disposition: A | Discharge status: Home / Self Care | Payer: Medicare Other | Source: Ambulatory Visit | Attending: Internal Medicine | Admitting: Internal Medicine

## 2015-05-27 DIAGNOSIS — J189 Pneumonia, unspecified organism: Secondary | ICD-10-CM

## 2015-05-28 LAB — POCT INR: INR: 3.33

## 2015-05-31 ENCOUNTER — Ambulatory Visit (INDEPENDENT_AMBULATORY_CARE_PROVIDER_SITE_OTHER): Payer: Medicare Other | Admitting: Internal Medicine

## 2015-05-31 DIAGNOSIS — I4892 Unspecified atrial flutter: Secondary | ICD-10-CM

## 2015-05-31 DIAGNOSIS — Z5181 Encounter for therapeutic drug level monitoring: Secondary | ICD-10-CM

## 2015-05-31 DIAGNOSIS — I4891 Unspecified atrial fibrillation: Secondary | ICD-10-CM

## 2015-06-07 NOTE — Progress Notes (Signed)
Cardiology Office Note   Date:  06/08/2015   ID:  [name] [name], DOB 06-21-1933, MRN [id number]  PCP:  Katy ApoPOLITE,RONALD D, MD  Cardiologist:  Dr. Katrinka BlazingSmith    Chief Complaint  Patient presents with  . Hospitalization Follow-up    no chest pain no SOb      History of Present Illness: [name] [name] is a 82 y.o. male who presents for post hospitalization 3/21-3/24/17 and 04/12/15- 04/17/15. Also 04/07/15 to 04/08/15 for a fib with RVR- given IV amiodarone and then home dose increased to 200 daily and pt converted to SR .Marland Kitchen.  On initial hospitalization 3/13 he was in SR and  went in to a flutter with RVR. Placed on IV amiodarone and converted to SR.  Previous hx of PAF.and hx of ablation for PSVT.   Chadvasc score 5. On last hospitalization he maintained SR.  Last admit pt admitted for acute hypoxemic resp. Failure.  Treated HCAP, acute encephalopathy due to PNA and hypoxiea, ESRD on HD MWF.  His amiodarone po was increased back to 200 mg daily which he took through out his rehab stay.  Once he went home he went back to 100 mg daily.  His Qtc was prolonged on last hospitalization and now is improved.  He is maintaining SR.  He has no chest pain and no SOB today. He feels he is doing well.  Labs are followed through renal.     Atrial fibrillation: Sinus rhythm, continue amiodarone at 100 mg daily and Coumadin. INR therapeutic at 2.61 on discharge.   Hypertension: Currently off all antihypertensive medications-as blood pressure soft during this hospital stay. Now BP elevated.  Anemia: Likely secondary to end-stage renal disease, no indication of blood loss. Nephrology managing Aranesp and iron Follow CBC closely  Past Medical History  Diagnosis Date  . Diabetes mellitus without complication (HCC)   . Atrial fibrillation (HCC)   . Hypertension   . Glaucoma   . Peripheral neuropathy (HCC)   . Gout   . Anemia   . ESRD (end stage renal disease) on dialysis [facility](HCC)     "MWF; [address]"  (04/08/2015)    Past Surgical History  Procedure Laterality Date  . Ablation of dysrhythmic focus    . Sp av dialysis shunt access existing *l*       Current Outpatient Prescriptions  Medication Sig Dispense Refill  . acetaminophen (TYLENOL) 325 MG tablet Take 650 mg by mouth daily as needed for mild pain.    Marland Kitchen. albuterol (PROVENTIL HFA;VENTOLIN HFA) 108 (90 Base) MCG/ACT inhaler Inhale 1-2 puffs into the lungs every 6 (six) hours as needed for wheezing or shortness of breath.     Marland Kitchen. amiodarone (PACERONE) 200 MG tablet Take 1 tablet (200 mg total) by mouth daily. 30 tablet 0  . atropine 1 % ophthalmic solution Place 1 drop into the left eye 2 times daily at 12 noon and 4 pm.    . Calcium Acetate 667 MG TABS Take 1-2 capsules by mouth 3 (three) times daily. 667 mg for snacks, 1334 mg for meals    . docusate sodium (COLACE) 100 MG capsule Take 100 mg by mouth 2 (two) times daily.    . dorzolamide-timolol (COSOPT) 22.3-6.8 MG/ML ophthalmic solution Place 1 drop into the right eye 2 (two) times daily.     . folic acid-vitamin b complex-vitamin c-selenium-zinc (DIALYVITE) 3 MG TABS tablet Take 1 tablet by mouth daily.    Marland Kitchen. lidocaine-prilocaine (EMLA) cream Apply  1 application topically as needed (apply to skin near dialysis port before dialysis).     . Multiple Vitamin (MULTIVITAMIN WITH MINERALS) TABS tablet Take 1 tablet by mouth daily. 30 tablet 0  . prednisoLONE acetate (PRED FORTE) 1 % ophthalmic suspension Place 1 drop into the left eye 2 (two) times daily. Patient to use for 37 days. Filled on 03-28-15    . SENSIPAR 60 MG tablet Take 60 mg by mouth at bedtime.     . simvastatin (ZOCOR) 20 MG tablet Take 20 mg by mouth every evening.    . tobramycin-dexamethasone (TOBRADEX) ophthalmic solution Place 1 drop into the left eye 4 (four) times daily. For 37 days. Filled 03-31-15    . TRAVATAN Z 0.004 % SOLN ophthalmic solution Place 1 drop into the right eye at bedtime.     Marland Kitchen warfarin (COUMADIN) 5 MG  tablet Take 7.5 mg daily, except on M, W, F . On M, W, F take 5 mg by mouth. 50 tablet 3   No current facility-administered medications for this visit.    Allergies:   Diltiazem    Social History:  The patient  reports that he has never smoked. He has never used smokeless tobacco. He reports that he does not drink alcohol or use illicit drugs.   Family History:  The patient's family history includes Alcoholism in his brother and brother; Heart failure in his father; Other in his mother.    ROS:  General:no colds or fevers now,  weight increased Skin:no rashes or ulcers HEENT:no blurred vision, no congestion CV:see HPI PUL:see HPI GI:no diarrhea constipation or melena, no indigestion GU:no hematuria, no dysuria MS:no joint pain, no claudication Neuro:no syncope, no lightheadedness Endo:+ diabetes, no thyroid disease  Wt Readings from Last 3 Encounters:  06/08/15 220 lb 1.9 oz (99.846 kg)  04/23/15 214 lb 1.1 oz (97.1 kg)  04/16/15 192 lb 14.4 oz (87.5 kg)     PHYSICAL EXAM: VS:  BP 150/80 mmHg  Pulse 86  Ht 6\' 4"  (1.93 m)  Wt 220 lb 1.9 oz (99.846 kg)  BMI 26.81 kg/m2  SpO2 95% , BMI Body mass index is 26.81 kg/(m^2). 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 Neck:supple, no JVD, no bruits  Heart:S1S2 RRR without murmur, gallup, rub or click Lungs:clear without rales, rhonchi, or wheezes WUJ:WJXB, non tender, + BS, do not palpate liver spleen or masses Ext:no lower ext edema, 2+ pedal pulses, 2+ radial pulses Neuro:alert and oriented X 3, MAE, follows commands, + facial symmetry    EKG:  EKG is ordered today. The ekg ordered today demonstrates SR with 1st degree AV block PR 238 ms.  Qtc 480 ms though improved. From previous EKGs   Recent Labs: 04/07/2015: TSH 3.298 04/17/2015: Magnesium 2.1 04/21/2015: ALT 22 04/23/2015: BUN 28*; Creatinine, Ser 7.73*; Hemoglobin  8.4*; Platelets 258; Potassium 5.0; Sodium 129*    Lipid Panel No results found for: CHOL, TRIG, HDL, CHOLHDL, VLDL, LDLCALC, LDLDIRECT     Other studies Reviewed: Additional studies/ records that were reviewed today include: hospital notes.  CXR post hospital = may need CT scan.  Labs.    ASSESSMENT AND PLAN:  1. PAF; maintaining SR will leave amiodarone 100 mg daily.    2. HTN add back amlodipine.5 mg daily  3.  ESRD on HD  MWF.   4. Anticoagulation on chronic coumadin INR followed here.  5. Abnormal follow up CXR followed  by PCP.  Recent PNA  Current medicines are reviewed with the patient today.  The patient Has no concerns regarding medicines.  The following changes have been made:  See above Labs/ tests ordered today include:see above  Disposition:   FU:  see above  Signed, Leone Brand, NP  06/08/2015 10:09 AM    [facility] [address] [place], [place], [place]  [address]/ [address] [place], [place] Phone: [phone]; Fax: [phone]  [phone]

## 2015-06-08 ENCOUNTER — Ambulatory Visit (INDEPENDENT_AMBULATORY_CARE_PROVIDER_SITE_OTHER): Payer: Medicare Other | Admitting: Cardiology

## 2015-06-08 ENCOUNTER — Other Ambulatory Visit: Payer: Self-pay | Admitting: Internal Medicine

## 2015-06-08 ENCOUNTER — Encounter: Payer: Self-pay | Admitting: Cardiology

## 2015-06-08 ENCOUNTER — Ambulatory Visit (INDEPENDENT_AMBULATORY_CARE_PROVIDER_SITE_OTHER): Payer: Medicare Other | Admitting: *Deleted

## 2015-06-08 VITALS — BP 150/80 | HR 86 | Ht 76.0 in | Wt 220.1 lb

## 2015-06-08 DIAGNOSIS — R9389 Abnormal findings on diagnostic imaging of other specified body structures: Secondary | ICD-10-CM

## 2015-06-08 DIAGNOSIS — I483 Typical atrial flutter: Secondary | ICD-10-CM | POA: Diagnosis not present

## 2015-06-08 DIAGNOSIS — N186 End stage renal disease: Secondary | ICD-10-CM

## 2015-06-08 DIAGNOSIS — I4892 Unspecified atrial flutter: Secondary | ICD-10-CM

## 2015-06-08 DIAGNOSIS — Z992 Dependence on renal dialysis: Secondary | ICD-10-CM

## 2015-06-08 DIAGNOSIS — Z5181 Encounter for therapeutic drug level monitoring: Secondary | ICD-10-CM

## 2015-06-08 DIAGNOSIS — I4891 Unspecified atrial fibrillation: Secondary | ICD-10-CM

## 2015-06-08 DIAGNOSIS — Z79899 Other long term (current) drug therapy: Secondary | ICD-10-CM

## 2015-06-08 DIAGNOSIS — I10 Essential (primary) hypertension: Secondary | ICD-10-CM

## 2015-06-08 LAB — POCT INR: INR: 1.8

## 2015-06-08 MED ORDER — AMLODIPINE BESYLATE 5 MG PO TABS: 5.0000 mg | ORAL_TABLET | Freq: Every day | ORAL | Status: DC

## 2015-06-08 NOTE — Patient Instructions (Addendum)
Medication Instructions:  Your physician has recommended you make the following change in your medication:  1.  START Amlodipine 5 mg taking 1 tablet daily.   Labwork: None ordered  Testing/Procedures: None ordered  Follow-Up: Your physician recommends that you schedule a follow-up appointment in: 6 WEEKS WITH DR. Katrinka BlazingSMITH   Any Other Special Instructions Will Be Listed Below (If Applicable).   If you need a refill on your cardiac medications before your next appointment, please call your pharmacy.

## 2015-06-15 ENCOUNTER — Ambulatory Visit
Admission: RE | Admit: 2015-06-15 | Discharge: 2015-06-15 | Disposition: A | Discharge status: Home / Self Care | Payer: Medicare Other | Source: Ambulatory Visit | Attending: Internal Medicine | Admitting: Internal Medicine

## 2015-06-15 DIAGNOSIS — R9389 Abnormal findings on diagnostic imaging of other specified body structures: Secondary | ICD-10-CM

## 2015-06-21 ENCOUNTER — Encounter (HOSPITAL_COMMUNITY): Payer: Self-pay

## 2015-06-21 ENCOUNTER — Emergency Department (HOSPITAL_COMMUNITY)
Admission: EM | Admit: 2015-06-21 | Discharge: 2015-06-21 | Disposition: A | Discharge status: Home / Self Care | Payer: Medicare Other | Attending: Emergency Medicine | Admitting: Emergency Medicine

## 2015-06-21 DIAGNOSIS — D649 Anemia, unspecified: Secondary | ICD-10-CM | POA: Insufficient documentation

## 2015-06-21 DIAGNOSIS — Z79899 Other long term (current) drug therapy: Secondary | ICD-10-CM | POA: Diagnosis not present

## 2015-06-21 DIAGNOSIS — T829XXA Unspecified complication of cardiac and vascular prosthetic device, implant and graft, initial encounter: Secondary | ICD-10-CM

## 2015-06-21 DIAGNOSIS — Z8739 Personal history of other diseases of the musculoskeletal system and connective tissue: Secondary | ICD-10-CM | POA: Insufficient documentation

## 2015-06-21 DIAGNOSIS — N186 End stage renal disease: Secondary | ICD-10-CM | POA: Diagnosis not present

## 2015-06-21 DIAGNOSIS — Z7901 Long term (current) use of anticoagulants: Secondary | ICD-10-CM | POA: Insufficient documentation

## 2015-06-21 DIAGNOSIS — H409 Unspecified glaucoma: Secondary | ICD-10-CM | POA: Diagnosis not present

## 2015-06-21 DIAGNOSIS — E119 Type 2 diabetes mellitus without complications: Secondary | ICD-10-CM | POA: Insufficient documentation

## 2015-06-21 DIAGNOSIS — Y658 Other specified misadventures during surgical and medical care: Secondary | ICD-10-CM | POA: Diagnosis not present

## 2015-06-21 DIAGNOSIS — I120 Hypertensive chronic kidney disease with stage 5 chronic kidney disease or end stage renal disease: Secondary | ICD-10-CM | POA: Diagnosis not present

## 2015-06-21 DIAGNOSIS — T82838A Hemorrhage of vascular prosthetic devices, implants and grafts, initial encounter: Secondary | ICD-10-CM | POA: Insufficient documentation

## 2015-06-21 DIAGNOSIS — Z992 Dependence on renal dialysis: Secondary | ICD-10-CM | POA: Diagnosis not present

## 2015-06-21 DIAGNOSIS — I4891 Unspecified atrial fibrillation: Secondary | ICD-10-CM | POA: Diagnosis not present

## 2015-06-21 NOTE — ED Notes (Signed)
Per EMS - pt from dialysis. Pt completed dialysis tx, fistula took 1.5hrs to stop bleeding. Dialysis center called EMS. Bleeding is now under control. 5.5 L taken off at dialysis center. No other complaints.

## 2015-06-21 NOTE — ED Provider Notes (Signed)
CSN: [id number]     Arrival date & time 06/21/15  1653 History   First MD Initiated Contact with Patient 06/21/15 1704     Chief Complaint  Patient presents with  . Vascular Access Problem     (Consider location/radiation/quality/duration/timing/severity/associated sxs/prior Treatment) HPI  The patient is a 82 year old male who is a dialysis patient and comes from dialysis with complaint of left arm fistula bleeding after dialysis. The patient had a mild pressure dressing placed however it bled through a couple of times. Since the paramedics picked the patient up there has been no further bleeding. The patient denies any other symptoms and states that he is feeling well otherwise. The bleeding was acute in onset, persistent, gradually improving, not associated with nausea vomiting or shortness of breath. The patient is on Coumadin for atrial fibrillation  Past Medical History  Diagnosis Date  . Diabetes mellitus without complication (HCC)   . Atrial fibrillation (HCC)   . Hypertension   . Glaucoma   . Peripheral neuropathy (HCC)   . Gout   . Anemia   . ESRD (end stage renal disease) on dialysis [facility])     "MWF; [address]" (04/08/2015)   Past Surgical History  Procedure Laterality Date  . Ablation of dysrhythmic focus    . Sp av dialysis shunt access existing *l*     Family History  Problem Relation Age of Onset  . Alcoholism Brother   . Alcoholism Brother   . Other Mother     bowel obstruction  . Heart failure Father     fluid bluid up   Social History  Substance Use Topics  . Smoking status: Never Smoker   . Smokeless tobacco: Never Used  . Alcohol Use: No    Review of Systems  All other systems reviewed and are negative.     Allergies  Diltiazem  Home Medications   Prior to Admission medications   Medication Sig Start Date End Date Taking? Authorizing Provider  acetaminophen (TYLENOL) 325 MG tablet Take 650 mg by mouth daily as needed for mild  pain.   Yes Historical Provider, MD  albuterol (PROVENTIL HFA;VENTOLIN HFA) 108 (90 Base) MCG/ACT inhaler Inhale 1-2 puffs into the lungs every 6 (six) hours as needed for wheezing or shortness of breath.  03/29/15 03/28/16 Yes Historical Provider, MD  amiodarone (PACERONE) 200 MG tablet Take 100 mg by mouth daily.   Yes Historical Provider, MD  amLODipine (NORVASC) 5 MG tablet Take 1 tablet (5 mg total) by mouth daily. 06/08/15  Yes Leone Brand, NP  atropine 1 % ophthalmic solution Place 1 drop into the left eye 2 times daily at 12 noon and 4 pm.   Yes Historical Provider, MD  Calcium Acetate 667 MG TABS Take 1-2 capsules by mouth 3 (three) times daily. 667 mg for snacks, 1334 mg for meals   Yes Historical Provider, MD  docusate sodium (COLACE) 100 MG capsule Take 100 mg by mouth 2 (two) times daily.   Yes Historical Provider, MD  dorzolamide-timolol (COSOPT) 22.3-6.8 MG/ML ophthalmic solution Place 1 drop into the right eye 2 (two) times daily.    Yes Historical Provider, MD  folic acid-vitamin b complex-vitamin c-selenium-zinc (DIALYVITE) 3 MG TABS tablet Take 1 tablet by mouth daily.   Yes Historical Provider, MD  lidocaine-prilocaine (EMLA) cream Apply 1 application topically as needed (apply to skin near dialysis port before dialysis).  08/31/14  Yes Historical Provider, MD  Multiple Vitamin (MULTIVITAMIN WITH MINERALS) TABS  tablet Take 1 tablet by mouth daily. 04/17/15  Yes Belkys A Regalado, MD  prednisoLONE acetate (PRED FORTE) 1 % ophthalmic suspension Place 1 drop into the left eye 2 (two) times daily. Patient to use for 37 days. Filled on 03-28-15   Yes Historical Provider, MD  SENSIPAR 60 MG tablet Take 60 mg by mouth at bedtime.  09/03/14  Yes Historical Provider, MD  simvastatin (ZOCOR) 20 MG tablet Take 20 mg by mouth every evening.   Yes Historical Provider, MD  tobramycin-dexamethasone [facility](TOBRADEX) ophthalmic solution Place 1 drop into the left eye 4 (four) times daily. For 37 days. Filled  03-31-15   Yes Historical Provider, MD  TRAVATAN Z 0.004 % SOLN ophthalmic solution Place 1 drop into the right eye at bedtime.  08/30/14  Yes Historical Provider, MD  warfarin (COUMADIN) 5 MG tablet Take 7.5 mg daily, except on M, W, F . On M, W, F take 5 mg by mouth. Patient taking differently: 7.5-10 mg. Takes 10mg  on Thursday only Takes 7.5mg  all other days 04/17/15  Yes Belkys A Regalado, MD   BP 164/84 mmHg  Pulse 92  Resp 16  Wt 218 lb 0.9 oz (98.91 kg)  SpO2 96% Physical Exam  Constitutional: He appears well-developed and well-nourished.  HENT:  Head: Normocephalic and atraumatic.  Eyes: Conjunctivae are normal. Right eye exhibits no discharge. Left eye exhibits no discharge.  Cardiovascular:  The fistula is palpable, good thrill, no bleeding - dressing in place  Pulmonary/Chest: Effort normal. No respiratory distress.  Neurological: He is alert. Coordination normal.  Skin: Skin is warm and dry. No rash noted. He is not diaphoretic. No erythema.  Psychiatric: He has a normal mood and affect.  Nursing note and vitals reviewed.   ED Course  Procedures (including critical care time) Labs Review Labs Reviewed - No data to display  Imaging Review No results found. I have personally reviewed and evaluated these images and lab results as part of my medical decision-making.  Bleeding stopped with dressing from dialysis - I have replaced with new clean dressing myself, stable for d/c.    MDM   Final diagnoses:  Complication of vascular access for dialysis, initial encounter (HCC)    Bleeding controlled, patient unremarkable in appearance, not in distress.    Eber HongBrian Maki Hege, MD 06/21/15 [phone]

## 2015-06-22 ENCOUNTER — Ambulatory Visit (INDEPENDENT_AMBULATORY_CARE_PROVIDER_SITE_OTHER): Payer: Medicare Other | Admitting: Pharmacist

## 2015-06-22 DIAGNOSIS — I4891 Unspecified atrial fibrillation: Secondary | ICD-10-CM | POA: Diagnosis not present

## 2015-06-22 DIAGNOSIS — Z5181 Encounter for therapeutic drug level monitoring: Secondary | ICD-10-CM

## 2015-06-22 DIAGNOSIS — I4892 Unspecified atrial flutter: Secondary | ICD-10-CM

## 2015-06-22 DIAGNOSIS — I483 Typical atrial flutter: Secondary | ICD-10-CM | POA: Diagnosis not present

## 2015-06-22 LAB — POCT INR: INR: 3.9

## 2015-07-09 ENCOUNTER — Other Ambulatory Visit: Payer: Self-pay | Admitting: *Deleted

## 2015-07-09 MED ORDER — WARFARIN SODIUM 5 MG PO TABS: ORAL_TABLET | ORAL | Status: DC

## 2015-07-13 ENCOUNTER — Ambulatory Visit (INDEPENDENT_AMBULATORY_CARE_PROVIDER_SITE_OTHER): Payer: Medicare Other | Admitting: *Deleted

## 2015-07-13 DIAGNOSIS — Z5181 Encounter for therapeutic drug level monitoring: Secondary | ICD-10-CM

## 2015-07-13 DIAGNOSIS — I4892 Unspecified atrial flutter: Secondary | ICD-10-CM

## 2015-07-13 DIAGNOSIS — I4891 Unspecified atrial fibrillation: Secondary | ICD-10-CM

## 2015-07-13 DIAGNOSIS — I483 Typical atrial flutter: Secondary | ICD-10-CM | POA: Diagnosis not present

## 2015-07-13 LAB — POCT INR: INR: 1.3

## 2015-07-20 ENCOUNTER — Ambulatory Visit (INDEPENDENT_AMBULATORY_CARE_PROVIDER_SITE_OTHER): Payer: Medicare Other | Admitting: Internal Medicine

## 2015-07-20 DIAGNOSIS — I4891 Unspecified atrial fibrillation: Secondary | ICD-10-CM

## 2015-07-20 DIAGNOSIS — I483 Typical atrial flutter: Secondary | ICD-10-CM

## 2015-07-20 DIAGNOSIS — Z5181 Encounter for therapeutic drug level monitoring: Secondary | ICD-10-CM

## 2015-07-20 LAB — POCT INR: INR: 1.5

## 2015-07-21 ENCOUNTER — Ambulatory Visit: Payer: Medicare Other | Admitting: Nurse Practitioner

## 2015-07-27 ENCOUNTER — Ambulatory Visit (INDEPENDENT_AMBULATORY_CARE_PROVIDER_SITE_OTHER): Payer: Medicare Other

## 2015-07-27 DIAGNOSIS — I4891 Unspecified atrial fibrillation: Secondary | ICD-10-CM

## 2015-07-27 DIAGNOSIS — Z5181 Encounter for therapeutic drug level monitoring: Secondary | ICD-10-CM

## 2015-07-27 DIAGNOSIS — I483 Typical atrial flutter: Secondary | ICD-10-CM

## 2015-07-27 LAB — POCT INR: INR: 2.9

## 2015-08-05 ENCOUNTER — Ambulatory Visit (INDEPENDENT_AMBULATORY_CARE_PROVIDER_SITE_OTHER): Payer: Medicare Other | Admitting: Pharmacist

## 2015-08-05 DIAGNOSIS — I483 Typical atrial flutter: Secondary | ICD-10-CM

## 2015-08-05 DIAGNOSIS — I4891 Unspecified atrial fibrillation: Secondary | ICD-10-CM

## 2015-08-05 DIAGNOSIS — Z5181 Encounter for therapeutic drug level monitoring: Secondary | ICD-10-CM

## 2015-08-05 LAB — POCT INR: INR: 3.4

## 2015-08-12 ENCOUNTER — Emergency Department (HOSPITAL_BASED_OUTPATIENT_CLINIC_OR_DEPARTMENT_OTHER): Payer: Medicare Other

## 2015-08-12 ENCOUNTER — Ambulatory Visit (INDEPENDENT_AMBULATORY_CARE_PROVIDER_SITE_OTHER): Payer: Medicare Other | Admitting: Internal Medicine

## 2015-08-12 ENCOUNTER — Encounter (HOSPITAL_BASED_OUTPATIENT_CLINIC_OR_DEPARTMENT_OTHER): Payer: Self-pay | Admitting: Emergency Medicine

## 2015-08-12 ENCOUNTER — Observation Stay (HOSPITAL_BASED_OUTPATIENT_CLINIC_OR_DEPARTMENT_OTHER)
Admission: EM | Admit: 2015-08-12 | Discharge: 2015-08-15 | Disposition: A | Discharge status: Home / Self Care | Payer: Medicare Other | Attending: Cardiology | Admitting: Cardiology

## 2015-08-12 ENCOUNTER — Other Ambulatory Visit: Payer: Self-pay | Admitting: *Deleted

## 2015-08-12 DIAGNOSIS — Z7901 Long term (current) use of anticoagulants: Secondary | ICD-10-CM | POA: Insufficient documentation

## 2015-08-12 DIAGNOSIS — I484 Atypical atrial flutter: Secondary | ICD-10-CM | POA: Insufficient documentation

## 2015-08-12 DIAGNOSIS — I10 Essential (primary) hypertension: Secondary | ICD-10-CM | POA: Diagnosis present

## 2015-08-12 DIAGNOSIS — I080 Rheumatic disorders of both mitral and aortic valves: Secondary | ICD-10-CM | POA: Diagnosis not present

## 2015-08-12 DIAGNOSIS — E1142 Type 2 diabetes mellitus with diabetic polyneuropathy: Secondary | ICD-10-CM | POA: Diagnosis not present

## 2015-08-12 DIAGNOSIS — I4891 Unspecified atrial fibrillation: Secondary | ICD-10-CM | POA: Diagnosis not present

## 2015-08-12 DIAGNOSIS — N186 End stage renal disease: Secondary | ICD-10-CM | POA: Diagnosis not present

## 2015-08-12 DIAGNOSIS — Z5181 Encounter for therapeutic drug level monitoring: Secondary | ICD-10-CM

## 2015-08-12 DIAGNOSIS — M109 Gout, unspecified: Secondary | ICD-10-CM | POA: Insufficient documentation

## 2015-08-12 DIAGNOSIS — H409 Unspecified glaucoma: Secondary | ICD-10-CM | POA: Diagnosis not present

## 2015-08-12 DIAGNOSIS — Z888 Allergy status to other drugs, medicaments and biological substances status: Secondary | ICD-10-CM | POA: Diagnosis not present

## 2015-08-12 DIAGNOSIS — E1122 Type 2 diabetes mellitus with diabetic chronic kidney disease: Secondary | ICD-10-CM | POA: Insufficient documentation

## 2015-08-12 DIAGNOSIS — I4892 Unspecified atrial flutter: Secondary | ICD-10-CM

## 2015-08-12 DIAGNOSIS — Z992 Dependence on renal dialysis: Secondary | ICD-10-CM | POA: Insufficient documentation

## 2015-08-12 DIAGNOSIS — I495 Sick sinus syndrome: Secondary | ICD-10-CM | POA: Insufficient documentation

## 2015-08-12 DIAGNOSIS — I480 Paroxysmal atrial fibrillation: Secondary | ICD-10-CM | POA: Diagnosis not present

## 2015-08-12 DIAGNOSIS — I120 Hypertensive chronic kidney disease with stage 5 chronic kidney disease or end stage renal disease: Secondary | ICD-10-CM | POA: Insufficient documentation

## 2015-08-12 DIAGNOSIS — I483 Typical atrial flutter: Secondary | ICD-10-CM

## 2015-08-12 HISTORY — DX: Supraventricular tachycardia, unspecified: I47.10

## 2015-08-12 HISTORY — DX: Supraventricular tachycardia: I47.1

## 2015-08-12 HISTORY — DX: Unspecified atrial flutter: I48.92

## 2015-08-12 HISTORY — DX: Type 2 diabetes mellitus without complications: E11.9

## 2015-08-12 HISTORY — DX: Paroxysmal atrial fibrillation: I48.0

## 2015-08-12 HISTORY — DX: Bradycardia, unspecified: R00.1

## 2015-08-12 LAB — BASIC METABOLIC PANEL
ANION GAP: 12 (ref 5–15)
BUN: 40 mg/dL — AB (ref 6–20)
CO2: 31 mmol/L (ref 22–32)
Calcium: 8.6 mg/dL — ABNORMAL LOW (ref 8.9–10.3)
Chloride: 92 mmol/L — ABNORMAL LOW (ref 101–111)
Creatinine, Ser: 9.6 mg/dL — ABNORMAL HIGH (ref 0.61–1.24)
GFR calc Af Amer: 5 mL/min — ABNORMAL LOW (ref >60)
GFR calc non Af Amer: 5 mL/min — ABNORMAL LOW (ref >60)
GLUCOSE: 261 mg/dL — AB (ref 65–99)
POTASSIUM: 3.6 mmol/L (ref 3.5–5.1)
Sodium: 135 mmol/L (ref 135–145)

## 2015-08-12 LAB — CBC WITH DIFFERENTIAL/PLATELET
BASOS ABS: 0 10*3/uL (ref 0.0–0.1)
Basophils Relative: 0 %
EOS PCT: 1 %
Eosinophils Absolute: 0.1 10*3/uL (ref 0.0–0.7)
HEMATOCRIT: 39.1 % (ref 39.0–52.0)
Hemoglobin: 13.1 g/dL (ref 13.0–17.0)
LYMPHS ABS: 1.7 10*3/uL (ref 0.7–4.0)
LYMPHS PCT: 35 %
MCH: 29.3 pg (ref 26.0–34.0)
MCHC: 33.5 g/dL (ref 30.0–36.0)
MCV: 87.5 fL (ref 78.0–100.0)
MONO ABS: 0.7 10*3/uL (ref 0.1–1.0)
Monocytes Relative: 14 %
NEUTROS ABS: 2.4 10*3/uL (ref 1.7–7.7)
Neutrophils Relative %: 50 %
PLATELETS: 152 10*3/uL (ref 150–400)
RBC: 4.47 MIL/uL (ref 4.22–5.81)
RDW: 15.6 % — AB (ref 11.5–15.5)
WBC: 4.9 10*3/uL (ref 4.0–10.5)

## 2015-08-12 LAB — BRAIN NATRIURETIC PEPTIDE: B Natriuretic Peptide: 276.9 pg/mL — ABNORMAL HIGH (ref 0.0–100.0)

## 2015-08-12 LAB — POCT INR: INR: 2.3

## 2015-08-12 LAB — GLUCOSE, CAPILLARY: GLUCOSE-CAPILLARY: 275 mg/dL — AB (ref 65–99)

## 2015-08-12 LAB — TROPONIN I: Troponin I: 0.05 ng/mL (ref <0.03)

## 2015-08-12 LAB — PROTIME-INR
INR: 2.34 — AB (ref 0.00–1.49)
Prothrombin Time: 25.4 seconds — ABNORMAL HIGH (ref 11.6–15.2)

## 2015-08-12 MED ORDER — ONDANSETRON HCL 4 MG/2ML IJ SOLN: 4.0000 mg | Freq: Four times a day (QID) | INTRAMUSCULAR | Status: DC | PRN

## 2015-08-12 MED ORDER — LATANOPROST 0.005 % OP SOLN
1.0000 [drp] | Freq: Every day | OPHTHALMIC | Status: DC
  Administered 2015-08-13 – 2015-08-14 (×2): 1 [drp] via OPHTHALMIC
  Filled 2015-08-12: qty 2.5

## 2015-08-12 MED ORDER — AMIODARONE HCL 100 MG PO TABS: 100.0000 mg | ORAL_TABLET | Freq: Every day | ORAL | Status: DC

## 2015-08-12 MED ORDER — DOCUSATE SODIUM 100 MG PO CAPS
100.0000 mg | ORAL_CAPSULE | Freq: Two times a day (BID) | ORAL | Status: DC
  Administered 2015-08-12 – 2015-08-15 (×5): 100 mg via ORAL
  Filled 2015-08-12 (×6): qty 1

## 2015-08-12 MED ORDER — TOBRAMYCIN-DEXAMETHASONE 0.3-0.1 % OP SUSP
1.0000 [drp] | Freq: Four times a day (QID) | OPHTHALMIC | Status: DC
  Administered 2015-08-13 – 2015-08-15 (×7): 1 [drp] via OPHTHALMIC
  Filled 2015-08-12 (×2): qty 2.5

## 2015-08-12 MED ORDER — DILTIAZEM HCL 100 MG IV SOLR
5.0000 mg/h | INTRAVENOUS | Status: DC
  Administered 2015-08-12: 5 mg/h via INTRAVENOUS
  Filled 2015-08-12: qty 100

## 2015-08-12 MED ORDER — WARFARIN - PHARMACIST DOSING INPATIENT: Freq: Every day | Status: DC

## 2015-08-12 MED ORDER — WARFARIN SODIUM 10 MG PO TABS
10.0000 mg | ORAL_TABLET | ORAL | Status: AC
  Administered 2015-08-12: 10 mg via ORAL
  Filled 2015-08-12: qty 1

## 2015-08-12 MED ORDER — ALBUTEROL SULFATE (2.5 MG/3ML) 0.083% IN NEBU: 2.5000 mg | INHALATION_SOLUTION | Freq: Four times a day (QID) | RESPIRATORY_TRACT | Status: DC | PRN

## 2015-08-12 MED ORDER — AMIODARONE HCL 100 MG PO TABS
100.0000 mg | ORAL_TABLET | Freq: Every day | ORAL | Status: DC
  Administered 2015-08-12 – 2015-08-13 (×2): 100 mg via ORAL
  Filled 2015-08-12 (×2): qty 1

## 2015-08-12 MED ORDER — PREDNISOLONE ACETATE 1 % OP SUSP
1.0000 [drp] | Freq: Two times a day (BID) | OPHTHALMIC | Status: DC
  Administered 2015-08-13 – 2015-08-15 (×4): 1 [drp] via OPHTHALMIC
  Filled 2015-08-12 (×2): qty 1

## 2015-08-12 MED ORDER — LIDOCAINE-PRILOCAINE 2.5-2.5 % EX CREA: 1.0000 "application " | TOPICAL_CREAM | CUTANEOUS | Status: DC | PRN

## 2015-08-12 MED ORDER — CALCIUM ACETATE (PHOS BINDER) 667 MG PO CAPS: 667.0000 mg | ORAL_CAPSULE | ORAL | Status: DC | PRN

## 2015-08-12 MED ORDER — WARFARIN SODIUM 5 MG PO TABS: 5.0000 mg | ORAL_TABLET | Freq: Once | ORAL | Status: DC

## 2015-08-12 MED ORDER — DIALYVITE 3000 3 MG PO TABS: 1.0000 | ORAL_TABLET | Freq: Every day | ORAL | Status: DC

## 2015-08-12 MED ORDER — DILTIAZEM HCL 100 MG IV SOLR
7.5000 mg/h | INTRAVENOUS | Status: DC
  Administered 2015-08-12 – 2015-08-13 (×3): 15 mg/h via INTRAVENOUS
  Administered 2015-08-14: 7.5 mg/h via INTRAVENOUS
  Filled 2015-08-12 (×5): qty 100

## 2015-08-12 MED ORDER — CALCIUM ACETATE 667 MG PO TABS: 1.0000 | ORAL_TABLET | Freq: Three times a day (TID) | ORAL | Status: DC

## 2015-08-12 MED ORDER — ACETAMINOPHEN 325 MG PO TABS: 650.0000 mg | ORAL_TABLET | Freq: Every day | ORAL | Status: DC | PRN

## 2015-08-12 MED ORDER — ACETAMINOPHEN 325 MG PO TABS: 650.0000 mg | ORAL_TABLET | ORAL | Status: DC | PRN

## 2015-08-12 MED ORDER — METOPROLOL TARTRATE 25 MG PO TABS
25.0000 mg | ORAL_TABLET | Freq: Two times a day (BID) | ORAL | Status: DC
  Administered 2015-08-12: 25 mg via ORAL
  Filled 2015-08-12: qty 1

## 2015-08-12 MED ORDER — ADULT MULTIVITAMIN W/MINERALS CH: 1.0000 | ORAL_TABLET | Freq: Every day | ORAL | Status: DC

## 2015-08-12 MED ORDER — DORZOLAMIDE HCL-TIMOLOL MAL 2-0.5 % OP SOLN
1.0000 [drp] | Freq: Two times a day (BID) | OPHTHALMIC | Status: DC
  Administered 2015-08-13 – 2015-08-15 (×4): 1 [drp] via OPHTHALMIC
  Filled 2015-08-12: qty 10

## 2015-08-12 MED ORDER — ATROPINE SULFATE 1 % OP SOLN
1.0000 [drp] | Freq: Two times a day (BID) | OPHTHALMIC | Status: DC
  Administered 2015-08-13 – 2015-08-15 (×4): 1 [drp] via OPHTHALMIC
  Filled 2015-08-12 (×2): qty 2

## 2015-08-12 MED ORDER — CALCIUM ACETATE (PHOS BINDER) 667 MG PO CAPS
1334.0000 mg | ORAL_CAPSULE | Freq: Three times a day (TID) | ORAL | Status: DC
  Administered 2015-08-13 – 2015-08-15 (×7): 1334 mg via ORAL
  Filled 2015-08-12 (×6): qty 2

## 2015-08-12 MED ORDER — SIMVASTATIN 20 MG PO TABS
20.0000 mg | ORAL_TABLET | Freq: Every evening | ORAL | Status: DC
  Administered 2015-08-12 – 2015-08-13 (×2): 20 mg via ORAL
  Filled 2015-08-12 (×2): qty 1

## 2015-08-12 MED ORDER — CINACALCET HCL 30 MG PO TABS
60.0000 mg | ORAL_TABLET | Freq: Every day | ORAL | Status: DC
  Administered 2015-08-12 – 2015-08-14 (×3): 60 mg via ORAL
  Filled 2015-08-12 (×3): qty 2

## 2015-08-12 MED ORDER — INSULIN ASPART 100 UNIT/ML ~~LOC~~ SOLN
0.0000 [IU] | Freq: Three times a day (TID) | SUBCUTANEOUS | Status: DC
  Administered 2015-08-13: 2 [IU] via SUBCUTANEOUS
  Administered 2015-08-14: 1 [IU] via SUBCUTANEOUS
  Administered 2015-08-14: 2 [IU] via SUBCUTANEOUS
  Administered 2015-08-14: 7 [IU] via SUBCUTANEOUS
  Administered 2015-08-15: 2 [IU] via SUBCUTANEOUS
  Administered 2015-08-15: 3 [IU] via SUBCUTANEOUS

## 2015-08-12 MED ORDER — WARFARIN SODIUM 7.5 MG PO TABS
7.5000 mg | ORAL_TABLET | Freq: Every day | ORAL | Status: DC
Start: 2015-08-12 — End: 2015-08-12

## 2015-08-12 MED ORDER — RENA-VITE PO TABS
1.0000 | ORAL_TABLET | Freq: Every day | ORAL | Status: DC
  Administered 2015-08-12 – 2015-08-14 (×3): 1 via ORAL
  Filled 2015-08-12 (×3): qty 1

## 2015-08-12 MED ORDER — AMLODIPINE BESYLATE 5 MG PO TABS
5.0000 mg | ORAL_TABLET | Freq: Every day | ORAL | Status: DC
  Administered 2015-08-14 – 2015-08-15 (×2): 5 mg via ORAL
  Filled 2015-08-12 (×2): qty 1

## 2015-08-12 NOTE — Progress Notes (Signed)
ANTICOAGULATION CONSULT NOTE - Initial Consult  Pharmacy Consult for Warfarin Indication: atrial fibrillation  Allergies  Allergen Reactions  . Diltiazem Other (See Comments)    SEVERE STOMACH CRAMPING PER PT    Patient Measurements: Height:  (193 cm) Weight: 225 lb (102.059 kg) IBW/kg (Calculated) : 86.8 Heparin Dosing Weight:   Vital Signs: Temp: 98.5 F (36.9 C) (07/13 1657) BP: 117/81 mmHg (07/13 1930) Pulse Rate: 117 (07/13 1930)  Labs:  Recent Labs  08/12/15 08/12/15 1715  HGB  --  13.1  HCT  --  39.1  PLT  --  152  LABPROT  --  25.4*  INR 2.3 2.34*  CREATININE  --  9.60*  TROPONINI  --  0.05*    Estimated Creatinine Clearance: 7.7 mL/min (by C-G formula based on Cr of 9.6).   Medical History: Past Medical History  Diagnosis Date  . Diabetes mellitus (HCC)   . PAF (paroxysmal atrial fibrillation) (HCC)   . Hypertension   . Glaucoma   . Peripheral neuropathy (HCC)   . Gout   . Anemia   . ESRD (end stage renal disease) on dialysis [facility])     "MWF; [address]" (04/08/2015)  . Atrial flutter (HCC)   . PSVT (paroxysmal supraventricular tachycardia) (HCC)     a. h/o ablation.  . Bradycardia     a. unable to tolerate beta blockers in the past.    Medications:  Prescriptions prior to admission  Medication Sig Dispense Refill Last Dose  . acetaminophen (TYLENOL) 325 MG tablet Take 650 mg by mouth daily as needed for mild pain.   Past Month at Unknown time  . albuterol (PROVENTIL HFA;VENTOLIN HFA) 108 (90 Base) MCG/ACT inhaler Inhale 1-2 puffs into the lungs every 6 (six) hours as needed for wheezing or shortness of breath.    Past Month at Unknown time  . amiodarone (PACERONE) 200 MG tablet Take 100 mg by mouth daily.   06/20/2015 at Unknown time  . amLODipine (NORVASC) 5 MG tablet Take 1 tablet (5 mg total) by mouth daily. 90 tablet 3 06/20/2015 at Unknown time  . atropine 1 % ophthalmic solution Place 1 drop into the left eye 2 times daily at  12 noon and 4 pm.   06/20/2015 at Unknown time  . Calcium Acetate 667 MG TABS Take 1-2 capsules by mouth 3 (three) times daily. 667 mg for snacks, 1334 mg for meals   06/20/2015 at Unknown time  . docusate sodium (COLACE) 100 MG capsule Take 100 mg by mouth 2 (two) times daily.   06/20/2015 at Unknown time  . dorzolamide-timolol (COSOPT) 22.3-6.8 MG/ML ophthalmic solution Place 1 drop into the right eye 2 (two) times daily.    06/20/2015 at Unknown time  . folic acid-vitamin b complex-vitamin c-selenium-zinc (DIALYVITE) 3 MG TABS tablet Take 1 tablet by mouth daily.   06/20/2015 at Unknown time  . lidocaine-prilocaine (EMLA) cream Apply 1 application topically as needed (apply to skin near dialysis port before dialysis).    06/21/2015 at Unknown time  . Multiple Vitamin (MULTIVITAMIN WITH MINERALS) TABS tablet Take 1 tablet by mouth daily. 30 tablet 0 06/20/2015 at Unknown time  . prednisoLONE acetate (PRED FORTE) 1 % ophthalmic suspension Place 1 drop into the left eye 2 (two) times daily. Patient to use for 37 days. Filled on 03-28-15   06/20/2015 at Unknown time  . SENSIPAR 60 MG tablet Take 60 mg by mouth at bedtime.    06/20/2015 at Unknown time  .  simvastatin (ZOCOR) 20 MG tablet Take 20 mg by mouth every evening.   06/20/2015 at Unknown time  . tobramycin-dexamethasone (TOBRADEX) ophthalmic solution Place 1 drop into the left eye 4 (four) times daily. For 37 days. Filled 03-31-15   06/20/2015 at Unknown time  . TRAVATAN Z 0.004 % SOLN ophthalmic solution Place 1 drop into the right eye at bedtime.    06/20/2015 at Unknown time  . warfarin (COUMADIN) 5 MG tablet Take as directed by [facility] (Patient taking differently: Take as directed by [facility]in Clinic 10mg  Thurs and 7.5mg  AODs) 50 tablet 3 08/11/2015   Scheduled:  . warfarin  10 mg Oral NOW  . [START ON 08/13/2015] Warfarin - Pharmacist Dosing Inpatient   Does not apply q1800   Infusions:  . diltiazem (CARDIZEM) infusion 15 mg/hr (08/12/15 1928)     Assessment: 82yo male with history of Afib on warfarin PTA presents with CP. Pharmacy is consulted to dose warfarin for atrial fibrillation.  PTA Warfarin Dose: 10mg  Thurs and 7.5mg  AODs with last dose on 7/12.  Goal of Therapy:  INR 2-3 Monitor platelets by anticoagulation protocol: Yes   Plan:  Warfarin 10mg  tonight x1 Daily INR/CBC Monitor s/sx of bleeding  Arlean Hoppingorey M. Newman PiesBall, PharmD, BCPS Clinical Pharmacist Pager [phone] 08/12/2015,9:09 PM

## 2015-08-12 NOTE — Progress Notes (Signed)
On call MD for Cardiology paged and made aware pt has arrived

## 2015-08-12 NOTE — ED Notes (Signed)
[facility] is aware of bed 3E05 for [facility]

## 2015-08-12 NOTE — ED Notes (Addendum)
Pt has one episode of chest pain today, non radiating, no associated symptoms.  Pt had just eaten and had laid down in the bed.  Pain lasted only a few minutes and subsided.  Pt was seen by home health nurse and told him to come to the hospital.

## 2015-08-12 NOTE — H&P (Signed)
History & Physical    Patient ID: [name] [name] MRN: [id number], DOB/AGE: 80-29-1935   Admit date: 08/12/2015   Primary Physician: Katy Apo, MD Primary Cardiologist: Katrinka Blazing  Patient Profile    Eisenberg is a 74 y o man with Afib with RVR  Past Medical History    Past Medical History  Diagnosis Date  . Diabetes mellitus (HCC)   . PAF (paroxysmal atrial fibrillation) (HCC)   . Hypertension   . Glaucoma   . Peripheral neuropathy (HCC)   . Gout   . Anemia   . ESRD (end stage renal disease) on dialysis [facility])     "MWF; [address]" (04/08/2015)  . Atrial flutter (HCC)   . PSVT (paroxysmal supraventricular tachycardia) (HCC)     a. h/o ablation.  . Bradycardia     a. unable to tolerate beta blockers in the past.    Past Surgical History  Procedure Laterality Date  . Ablation of dysrhythmic focus    . Sp av dialysis shunt access existing *l*       Allergies  Allergies  Allergen Reactions  . Diltiazem Other (See Comments)    SEVERE STOMACH CRAMPING PER PT    History of Present Illness    [name] has a PMH of ESRD, Afib with RVR, HTN, DM and repeat admissions for Afib with RVR. 3/21-3/24/17 and 04/12/15- 04/17/15. Also 04/07/15 to 04/08/15 for a fib with RVR- given IV amiodarone and then home dose increased to 200 daily and pt converted to SR .Marland Kitchen On initial hospitalization 3/13 he was in SR and went in to a flutter with RVR. Placed on IV amiodarone and converted to SR. Previous hx of PAF.and hx of ablation for PSVT. Chadvasc score 5. On last hospitalization he maintained SR. Last admission with PNA and hypoxic resp failure.   Today he presents as a transfer from [facility]. He is a pt of Dr Katrinka Blazing in Cardiology. He was told in HD yest to have elevated HR. Today his home health nurse told him the same and advised to go to ED. He denies CP, SOB, PND, orthopnea, LEE, cough, fever, chills or NS. No new meds.  On presentation to OSH ed his was 120's with 2:1 fllutter. He  was started on dilt drip and acceoted at [facility]. On arrival to [facility] he had a HR of 100 in Afib. Asymptomatic and otherwise normal vitals.   Home Medications    Prior to Admission medications   Medication Sig Start Date End Date Taking? Authorizing Provider  acetaminophen (TYLENOL) 325 MG tablet Take 650 mg by mouth daily as needed for mild pain.   Yes Historical Provider, MD  albuterol (PROVENTIL HFA;VENTOLIN HFA) 108 (90 Base) MCG/ACT inhaler Inhale 1-2 puffs into the lungs every 6 (six) hours as needed for wheezing or shortness of breath.  03/29/15 03/28/16 Yes Historical Provider, MD  amiodarone (PACERONE) 200 MG tablet Take 100 mg by mouth daily.   Yes Historical Provider, MD  amLODipine (NORVASC) 5 MG tablet Take 1 tablet (5 mg total) by mouth daily. 06/08/15  Yes Leone Brand, NP  atropine 1 % ophthalmic solution Place 1 drop into the left eye 2 times daily at 12 noon and 4 pm.   Yes Historical Provider, MD  Calcium Acetate 667 MG TABS Take 1-2 capsules by mouth 3 (three) times daily. 667 mg for snacks, 1334 mg for meals   Yes Historical Provider, MD  docusate sodium (COLACE) 100 MG capsule Take  100 mg by mouth 2 (two) times daily.   Yes Historical Provider, MD  dorzolamide-timolol (COSOPT) 22.3-6.8 MG/ML ophthalmic solution Place 1 drop into the right eye 2 (two) times daily.    Yes Historical Provider, MD  folic acid-vitamin b complex-vitamin c-selenium-zinc (DIALYVITE) 3 MG TABS tablet Take 1 tablet by mouth daily.   Yes Historical Provider, MD  lidocaine-prilocaine (EMLA) cream Apply 1 application topically as needed (apply to skin near dialysis port before dialysis).  08/31/14  Yes Historical Provider, MD  Multiple Vitamin (MULTIVITAMIN WITH MINERALS) TABS tablet Take 1 tablet by mouth daily. 04/17/15  Yes Belkys A Regalado, MD  prednisoLONE acetate (PRED FORTE) 1 % ophthalmic suspension Place 1 drop into the left eye 2 (two) times daily. Patient to use for 37 days. Filled on 03-28-15   Yes  Historical Provider, MD  SENSIPAR 60 MG tablet Take 60 mg by mouth at bedtime.  09/03/14  Yes Historical Provider, MD  simvastatin (ZOCOR) 20 MG tablet Take 20 mg by mouth every evening.   Yes Historical Provider, MD  tobramycin-dexamethasone [facility](TOBRADEX) ophthalmic solution Place 1 drop into the left eye 4 (four) times daily. For 37 days. Filled 03-31-15   Yes Historical Provider, MD  TRAVATAN Z 0.004 % SOLN ophthalmic solution Place 1 drop into the right eye at bedtime.  08/30/14  Yes Historical Provider, MD  warfarin (COUMADIN) 5 MG tablet Take as directed by [facility] Patient taking differently: Take as directed by [facility] 10mg  Thurs and 7.5mg  AODs 07/09/15  Yes Lyn RecordsHenry W Smith, MD    Family History    Family History  Problem Relation Age of Onset  . Alcoholism Brother   . Alcoholism Brother   . Other Mother     bowel obstruction  . Heart failure Father     fluid bluid up    Social History    Social History   Social History  . Marital Status: Married    Spouse Name: N/A  . Number of Children: N/A  . Years of Education: N/A   Occupational History  . Not on file.   Social History Main Topics  . Smoking status: Never Smoker   . Smokeless tobacco: Never Used  . Alcohol Use: No  . Drug Use: No  . Sexual Activity: Not on file   Other Topics Concern  . Not on file   Social History Narrative     Review of Systems    General:  No chills, fever, night sweats or weight changes.  Cardiovascular:  No chest pain, dyspnea on exertion, edema, orthopnea, palpitations, paroxysmal nocturnal dyspnea. Dermatological: No rash, lesions/masses Respiratory: No cough, dyspnea Urologic: No hematuria, dysuria Abdominal:   No nausea, vomiting, diarrhea, bright red blood per rectum, melena, or hematemesis Neurologic:  No visual changes, wkns, changes in mental status. All other systems reviewed and are otherwise negative except as noted above.  Physical Exam    Blood pressure  133/90, pulse 97, temperature 98 F (36.7 C), temperature source Oral, resp. rate 19, height 6\' 4"  (1.93 m), weight 102.059 kg (225 lb), SpO2 100 %.  General: Pleasant, NAD Psych: Normal affect. Neuro: Alert and oriented X 3. Moves all extremities spontaneously. HEENT: Normal  Neck: Supple without bruits or JVD. Lungs:  Resp regular and unlabored, CTA. Heart: irreg, 2/6 syst m. Abdomen: Soft, non-tender, non-distended, BS + x 4.  Extremities:fistula on left arm  Labs    Troponin (Point of Care Test) No results for input(s): TROPIPOC in the last  72 hours.  Recent Labs  08/12/15 1715  TROPONINI 0.05*   Lab Results  Component Value Date   WBC 4.9 08/12/2015   HGB 13.1 08/12/2015   HCT 39.1 08/12/2015   MCV 87.5 08/12/2015   PLT 152 08/12/2015    Recent Labs Lab 08/12/15 1715  NA 135  K 3.6  CL 92*  CO2 31  BUN 40*  CREATININE 9.60*  CALCIUM 8.6*  GLUCOSE 261*   No results found for: CHOL, HDL, LDLCALC, TRIG No results found for: [facility]   Radiology Studies    Dg Chest 2 View  08/12/2015  CLINICAL DATA:  Right sided chest pain. No sob. Chronic cough. Hx bronchitis. Non smoker. Diab, htn, renal dz. Hx pna. EXAM: CHEST  2 VIEW COMPARISON:  05/27/2015 FINDINGS: Cardiac silhouette is normal in size and configuration. No mediastinal or hilar masses or evidence of adenopathy. Irregular interstitial opacities in the lungs are less prominent than on the prior chest radiographs. These are likely areas of chronic parenchymal scarring. No evidence of pneumonia or pulmonary edema. No pleural effusion or pneumothorax. Bony thorax is intact. IMPRESSION: No acute cardiopulmonary disease. Electronically Signed   By: Amie Portland M.D.   On: 08/12/2015 17:39    ECG & Cardiac Imaging    Pending. Tele with Afib HR 100-105  Assessment & Plan    Mr Santoli has multiple co-morbidities including uncontrolled DM, HTN, PAF, ESRD. He presents now the 3rd time in Afib with RVR to the hospital.  He is generally somewhat symotomatic with it but not today. I am not able to identify a trigger. He does not appear to be infected (nomal labs and CXR). His exam is reassurig and does not indicate sign volume overload. Last TTE couple years ago with stable LVEF.   Plan: Afib: - Cont dilt and wean as new metoprolol (  BID) kicks in. Would recommend to go out on additonal nodal blocking agent on top of amio. He does not appear to have issues with bradycardia - TTE in am - Cont on warfarin, his INR is therapeutic   HTN: Cont on home admlodip  DM: Apparently with history of hypoglycemia. His admission BS >200. On no meds but tells me he is on glipizide at home. Need to verify in am and eval his BS.   ESRD: HD in am    Signed, Macario Golds, NP 08/12/2015, 10:01 PM

## 2015-08-12 NOTE — ED Provider Notes (Signed)
CSN: [id number]     Arrival date & time 08/12/15  1648 History  By signing my name below, I, Levon Hedger, attest that this documentation has been prepared under the direction and in the presence of non-physician practitioner, Melburn Hake, PA-C Electronically Signed: Levon Hedger, Scribe. 08/12/2015. 5:43 PM.   Chief Complaint  Patient presents with  . Chest Pain   The history is provided by the patient. No language interpreter was used.    HPI Comments:  [name] [name] is a 82 y.o. male with PMHx of Afib, HTN, ESRD on dialysis (M/W/F), and DM who presents to the Emergency Department complaining of right sided, nonradiating, aching chest pain today at 3pm while he was sitting down after eating lunch. He states it lasted a few minutes and resolved on its own. No alleviating factors or associated symptoms noted. Pt also complains of elevated heart rate. Pt had dialysis yesterday and reports his heart rate was 129 BPM during his tx but would intermittently slow down into the 60s/70s which he notes he is normal baseline rate. Pt has PSHx of ablation and is currently on coumadin for Afib. He denies shortness of breath, palpitations, fever, lightheadedness, dizziness, numbness, tingling, diaphoresis, weakness, nausea, vomiting, appetite change, leg swelling, or abdominal pain.   Past Medical History  Diagnosis Date  . Diabetes mellitus without complication (HCC)   . Atrial fibrillation (HCC)   . Hypertension   . Glaucoma   . Peripheral neuropathy (HCC)   . Gout   . Anemia   . ESRD (end stage renal disease) on dialysis [facility])     "MWF; [address]" (04/08/2015)   Past Surgical History  Procedure Laterality Date  . Ablation of dysrhythmic focus    . Sp av dialysis shunt access existing *l*     Family History  Problem Relation Age of Onset  . Alcoholism Brother   . Alcoholism Brother   . Other Mother     bowel obstruction  . Heart failure Father     fluid bluid up   Social  History  Substance Use Topics  . Smoking status: Never Smoker   . Smokeless tobacco: Never Used  . Alcohol Use: No    Review of Systems  Cardiovascular: Positive for chest pain.  All other systems reviewed and are negative.  Allergies  Diltiazem  Home Medications   Prior to Admission medications   Medication Sig Start Date End Date Taking? Authorizing Provider  acetaminophen (TYLENOL) 325 MG tablet Take 650 mg by mouth daily as needed for mild pain.    Historical Provider, MD  albuterol (PROVENTIL HFA;VENTOLIN HFA) 108 (90 Base) MCG/ACT inhaler Inhale 1-2 puffs into the lungs every 6 (six) hours as needed for wheezing or shortness of breath.  03/29/15 03/28/16  Historical Provider, MD  amiodarone (PACERONE) 200 MG tablet Take 100 mg by mouth daily.    Historical Provider, MD  amLODipine (NORVASC) 5 MG tablet Take 1 tablet (5 mg total) by mouth daily. 06/08/15   Leone Brand, NP  atropine 1 % ophthalmic solution Place 1 drop into the left eye 2 times daily at 12 noon and 4 pm.    Historical Provider, MD  Calcium Acetate 667 MG TABS Take 1-2 capsules by mouth 3 (three) times daily. 667 mg for snacks, 1334 mg for meals    Historical Provider, MD  docusate sodium (COLACE) 100 MG capsule Take 100 mg by mouth 2 (two) times daily.    Historical Provider, MD  dorzolamide-timolol (COSOPT) 22.3-6.8 MG/ML ophthalmic solution Place 1 drop into the right eye 2 (two) times daily.     Historical Provider, MD  folic acid-vitamin b complex-vitamin c-selenium-zinc (DIALYVITE) 3 MG TABS tablet Take 1 tablet by mouth daily.    Historical Provider, MD  lidocaine-prilocaine (EMLA) cream Apply 1 application topically as needed (apply to skin near dialysis port before dialysis).  08/31/14   Historical Provider, MD  Multiple Vitamin (MULTIVITAMIN WITH MINERALS) TABS tablet Take 1 tablet by mouth daily. 04/17/15   Belkys A Regalado, MD  prednisoLONE acetate (PRED FORTE) 1 % ophthalmic suspension Place 1 drop into the  left eye 2 (two) times daily. Patient to use for 37 days. Filled on 03-28-15    Historical Provider, MD  SENSIPAR 60 MG tablet Take 60 mg by mouth at bedtime.  09/03/14   Historical Provider, MD  simvastatin (ZOCOR) 20 MG tablet Take 20 mg by mouth every evening.    Historical Provider, MD  tobramycin-dexamethasone [facility]) ophthalmic solution Place 1 drop into the left eye 4 (four) times daily. For 37 days. Filled 03-31-15    Historical Provider, MD  TRAVATAN Z 0.004 % SOLN ophthalmic solution Place 1 drop into the right eye at bedtime.  08/30/14   Historical Provider, MD  warfarin (COUMADIN) 5 MG tablet Take as directed by [facility] 07/09/15   Lyn Records, MD   BP 118/90 mmHg  Pulse 128  Temp(Src) 98.5 F (36.9 C)  Resp 17  Ht  (1.93 m)  Wt 102.059 kg  BMI 27.40 kg/m2  SpO2 98% Physical Exam  Constitutional: He is oriented to person, place, and time. He appears well-developed and well-nourished. No distress.  HENT:  Head: Normocephalic and atraumatic.  Mouth/Throat: Oropharynx is clear and moist. No oropharyngeal exudate.  Eyes: Conjunctivae and EOM are normal. Pupils are equal, round, and reactive to light. Right eye exhibits no discharge. Left eye exhibits no discharge. No scleral icterus.  Neck: Normal range of motion. Neck supple.  Cardiovascular: Regular rhythm, normal heart sounds and intact distal pulses.   Tachycardic heart rate 125   Pulmonary/Chest: Effort normal and breath sounds normal. No respiratory distress. He has no wheezes. He has no rales. He exhibits no tenderness.  Abdominal: Soft. Bowel sounds are normal. He exhibits no distension and no mass. There is no tenderness. There is no rebound and no guarding.  Musculoskeletal: Normal range of motion. He exhibits no edema.  Neurological: He is alert and oriented to person, place, and time.  Skin: Skin is warm and dry. He is not diaphoretic.  Nursing note and vitals reviewed.  ED Course  Procedures  DIAGNOSTIC  STUDIES:  Oxygen Saturation is 96% on RA, adequate by my interpretation.    COORDINATION OF CARE:  5:10 PM Will order DG chest, BMP, CBC, and troponin. Discussed treatment plan with pt at bedside and pt agreed to plan.  Labs Review Labs Reviewed  CBC WITH DIFFERENTIAL/PLATELET - Abnormal; Notable for the following:    RDW 15.6 (*)    All other components within normal limits  BASIC METABOLIC PANEL - Abnormal; Notable for the following:    Chloride 92 (*)    Glucose, Bld 261 (*)    BUN 40 (*)    Creatinine, Ser 9.60 (*)    Calcium 8.6 (*)    GFR calc non Af Amer 5 (*)    GFR calc Af Amer 5 (*)    All other components within normal limits  TROPONIN I -  Abnormal; Notable for the following:    Troponin I 0.05 (*)    All other components within normal limits  PROTIME-INR - Abnormal; Notable for the following:    Prothrombin Time 25.4 (*)    INR 2.34 (*)    All other components within normal limits   Imaging Review Dg Chest 2 View  08/12/2015  CLINICAL DATA:  Right sided chest pain. No sob. Chronic cough. Hx bronchitis. Non smoker. Diab, htn, renal dz. Hx pna. EXAM: CHEST  2 VIEW COMPARISON:  05/27/2015 FINDINGS: Cardiac silhouette is normal in size and configuration. No mediastinal or hilar masses or evidence of adenopathy. Irregular interstitial opacities in the lungs are less prominent than on the prior chest radiographs. These are likely areas of chronic parenchymal scarring. No evidence of pneumonia or pulmonary edema. No pleural effusion or pneumothorax. Bony thorax is intact. IMPRESSION: No acute cardiopulmonary disease. Electronically Signed   By: Amie Portlandavid  Ormond M.D.   On: 08/12/2015 17:39   I have personally reviewed and evaluated these images and lab results as part of my medical decision-making.   EKG Interpretation   Date/Time:  Thursday August 12 2015 16:55:42 EDT Ventricular Rate:  125 PR Interval:    QRS Duration: 81 QT Interval:  340 QTC Calculation: 491 R Axis:    46 Text Interpretation:  Atrial flutter with 2 to 1 block Anterior infarct,  old Nonspecific T abnormalities, lateral leads Confirmed by KNOTT MD,  DANIEL ([id number]) on 08/12/2015 5:01:55 PM      MDM   Final diagnoses:  Atrial flutter, unspecified type (HCC)   Pt presents with episode of chest pain and reported tachycardia at dialysis yesterday. Hx of DM, ESRD on dialysis, HTN, afib (on coumadin). HR 125, remaining vitals stable. Exam unremarkable. Pt denies any pain or complaints at this time. Chart review shows pt with hx of aflutter requiring conversion and hx of cardiac ablation. EKG showed atrial flutter with 2 to 1 block at rate 125. INR 2.34. CXR negative.   Consulted Cardiology. Dr. Rennis GoldenHilty recommends starting IV diltiazem in the ED and admit the pt to telemetry observation with plan to continue pt's PO amiodarone and coumadin. If pt's remains tachycardic overnight, will plan to reevaluate for possible conversion in the morning. Discussed results and plan for admission with pt.   I personally performed the services described in this documentation, which was scribed in my presence. The recorded information has been reviewed and is accurate.     Satira Sarkicole Elizabeth WarrenNadeau, New JerseyPA-C 08/12/15 1814  Lyndal Pulleyaniel Knott, MD 08/14/15 [phone]

## 2015-08-13 ENCOUNTER — Other Ambulatory Visit (HOSPITAL_COMMUNITY): Payer: Medicare Other

## 2015-08-13 DIAGNOSIS — I495 Sick sinus syndrome: Secondary | ICD-10-CM | POA: Diagnosis not present

## 2015-08-13 DIAGNOSIS — I10 Essential (primary) hypertension: Secondary | ICD-10-CM | POA: Diagnosis not present

## 2015-08-13 DIAGNOSIS — N186 End stage renal disease: Secondary | ICD-10-CM | POA: Diagnosis not present

## 2015-08-13 DIAGNOSIS — I483 Typical atrial flutter: Secondary | ICD-10-CM | POA: Diagnosis not present

## 2015-08-13 DIAGNOSIS — I484 Atypical atrial flutter: Secondary | ICD-10-CM | POA: Diagnosis not present

## 2015-08-13 DIAGNOSIS — I480 Paroxysmal atrial fibrillation: Secondary | ICD-10-CM | POA: Diagnosis not present

## 2015-08-13 DIAGNOSIS — E1122 Type 2 diabetes mellitus with diabetic chronic kidney disease: Secondary | ICD-10-CM | POA: Diagnosis not present

## 2015-08-13 DIAGNOSIS — I120 Hypertensive chronic kidney disease with stage 5 chronic kidney disease or end stage renal disease: Secondary | ICD-10-CM | POA: Diagnosis not present

## 2015-08-13 LAB — RENAL FUNCTION PANEL
Albumin: 3.1 g/dL — ABNORMAL LOW (ref 3.5–5.0)
Anion gap: 13 (ref 5–15)
BUN: 45 mg/dL — ABNORMAL HIGH (ref 6–20)
CO2: 27 mmol/L (ref 22–32)
Calcium: 8.7 mg/dL — ABNORMAL LOW (ref 8.9–10.3)
Chloride: 91 mmol/L — ABNORMAL LOW (ref 101–111)
Creatinine, Ser: 10.59 mg/dL — ABNORMAL HIGH (ref 0.61–1.24)
GFR calc Af Amer: 5 mL/min — ABNORMAL LOW (ref >60)
GFR calc non Af Amer: 4 mL/min — ABNORMAL LOW (ref >60)
Glucose, Bld: 231 mg/dL — ABNORMAL HIGH (ref 65–99)
Phosphorus: 4.7 mg/dL — ABNORMAL HIGH (ref 2.5–4.6)
Potassium: 3.9 mmol/L (ref 3.5–5.1)
Sodium: 131 mmol/L — ABNORMAL LOW (ref 135–145)

## 2015-08-13 LAB — PROTIME-INR
INR: 2.71 — ABNORMAL HIGH (ref 0.00–1.49)
Prothrombin Time: 28.3 seconds — ABNORMAL HIGH (ref 11.6–15.2)

## 2015-08-13 LAB — GLUCOSE, CAPILLARY
GLUCOSE-CAPILLARY: 119 mg/dL — AB (ref 65–99)
GLUCOSE-CAPILLARY: 215 mg/dL — AB (ref 65–99)
Glucose-Capillary: 176 mg/dL — ABNORMAL HIGH (ref 65–99)

## 2015-08-13 MED ORDER — HEPARIN SODIUM (PORCINE) 1000 UNIT/ML DIALYSIS: 1000.0000 [IU] | INTRAMUSCULAR | Status: DC | PRN

## 2015-08-13 MED ORDER — LIDOCAINE-PRILOCAINE 2.5-2.5 % EX CREA: 1.0000 "application " | TOPICAL_CREAM | CUTANEOUS | Status: DC | PRN

## 2015-08-13 MED ORDER — METOPROLOL TARTRATE 50 MG PO TABS
50.0000 mg | ORAL_TABLET | Freq: Two times a day (BID) | ORAL | Status: DC
  Administered 2015-08-13 – 2015-08-15 (×4): 50 mg via ORAL
  Filled 2015-08-13 (×4): qty 1

## 2015-08-13 MED ORDER — AMIODARONE HCL 200 MG PO TABS: 200.0000 mg | ORAL_TABLET | Freq: Two times a day (BID) | ORAL | Status: DC

## 2015-08-13 MED ORDER — SODIUM CHLORIDE 0.9 % IV SOLN: 100.0000 mL | INTRAVENOUS | Status: DC | PRN

## 2015-08-13 MED ORDER — HEPARIN SODIUM (PORCINE) 1000 UNIT/ML DIALYSIS: 20.0000 [IU]/kg | INTRAMUSCULAR | Status: DC | PRN

## 2015-08-13 MED ORDER — ALTEPLASE 2 MG IJ SOLR: 2.0000 mg | Freq: Once | INTRAMUSCULAR | Status: DC | PRN

## 2015-08-13 MED ORDER — LIDOCAINE HCL (PF) 1 % IJ SOLN: 5.0000 mL | INTRAMUSCULAR | Status: DC | PRN

## 2015-08-13 MED ORDER — PENTAFLUOROPROP-TETRAFLUOROETH EX AERO: 1.0000 "application " | INHALATION_SPRAY | CUTANEOUS | Status: DC | PRN

## 2015-08-13 MED ORDER — WARFARIN SODIUM 5 MG PO TABS
5.0000 mg | ORAL_TABLET | Freq: Once | ORAL | Status: AC
  Administered 2015-08-13: 5 mg via ORAL
  Filled 2015-08-13 (×2): qty 1

## 2015-08-13 NOTE — Progress Notes (Signed)
ANTICOAGULATION CONSULT NOTE -Follow up  Pharmacy Consult for Warfarin Indication: atrial fibrillation  Allergies  Allergen Reactions  . Diltiazem Other (See Comments)    SEVERE STOMACH CRAMPING PER PT    Patient Measurements: Height:  (193 cm) Weight: 233 lb 0.4 oz (105.7 kg) (Standing scale) IBW/kg (Calculated) : 86.8 Heparin Dosing Weight:   Vital Signs: Temp: 97.5 F (36.4 C) (07/14 1157) Temp Source: Oral (07/14 1157) BP: 118/62 mmHg (07/14 1400) Pulse Rate: 65 (07/14 1400)  Labs:  Recent Labs  08/12/15 08/12/15 1715 08/13/15 1300  HGB  --  13.1  --   HCT  --  39.1  --   PLT  --  152  --   LABPROT  --  25.4* 28.3*  INR 2.3 2.34* 2.71*  CREATININE  --  9.60* 10.59*  TROPONINI  --  0.05*  --     Estimated Creatinine Clearance: 7.6 mL/min (by C-G formula based on Cr of 10.59).   Medical History: Past Medical History  Diagnosis Date  . Diabetes mellitus (HCC)   . PAF (paroxysmal atrial fibrillation) (HCC)   . Hypertension   . Glaucoma   . Peripheral neuropathy (HCC)   . Gout   . Anemia   . ESRD (end stage renal disease) on dialysis [facility])     "MWF; [address]" (04/08/2015)  . Atrial flutter (HCC)   . PSVT (paroxysmal supraventricular tachycardia) (HCC)     a. h/o ablation.  . Bradycardia     a. unable to tolerate beta blockers in the past.    Medications:  Prescriptions prior to admission  Medication Sig Dispense Refill Last Dose  . acetaminophen (TYLENOL) 325 MG tablet Take 650 mg by mouth daily as needed for mild pain.   08/12/2015 at Unknown time  . albuterol (PROVENTIL HFA;VENTOLIN HFA) 108 (90 Base) MCG/ACT inhaler Inhale 1-2 puffs into the lungs every 6 (six) hours as needed for wheezing or shortness of breath.    08/12/2015 at Unknown time  . amiodarone (PACERONE) 200 MG tablet Take 100 mg by mouth daily.   08/12/2015 at Unknown time  . amLODipine (NORVASC) 5 MG tablet Take 1 tablet (5 mg total) by mouth daily. 90 tablet 3 08/12/2015  at Unknown time  . atropine 1 % ophthalmic solution Place 1 drop into the left eye 2 times daily at 12 noon and 4 pm.   08/12/2015 at Unknown time  . Calcium Acetate 667 MG TABS Take 1-2 capsules by mouth 3 (three) times daily. 667 mg for snacks, 1334 mg for meals   08/12/2015 at Unknown time  . docusate sodium (COLACE) 100 MG capsule Take 100 mg by mouth 2 (two) times daily.   08/12/2015 at Unknown time  . dorzolamide-timolol (COSOPT) 22.3-6.8 MG/ML ophthalmic solution Place 1 drop into the right eye 2 (two) times daily.    08/12/2015 at Unknown time  . folic acid-vitamin b complex-vitamin c-selenium-zinc (DIALYVITE) 3 MG TABS tablet Take 1 tablet by mouth daily.   08/12/2015 at Unknown time  . lidocaine-prilocaine (EMLA) cream Apply 1 application topically as needed (apply to skin near dialysis port before dialysis).    08/12/2015 at Unknown time  . Multiple Vitamin (MULTIVITAMIN WITH MINERALS) TABS tablet Take 1 tablet by mouth daily. 30 tablet 0 08/12/2015 at Unknown time  . prednisoLONE acetate (PRED FORTE) 1 % ophthalmic suspension Place 1 drop into the left eye 2 (two) times daily. Patient to use for 37 days. Filled on 03-28-15   08/12/2015  at Unknown time  . SENSIPAR 60 MG tablet Take 60 mg by mouth at bedtime.    08/12/2015 at Unknown time  . simvastatin (ZOCOR) 20 MG tablet Take 20 mg by mouth every evening.   08/12/2015 at Unknown time  . tobramycin-dexamethasone (TOBRADEX) ophthalmic solution Place 1 drop into the left eye 4 (four) times daily. For 37 days. Filled 03-31-15   08/12/2015 at Unknown time  . TRAVATAN Z 0.004 % SOLN ophthalmic solution Place 1 drop into the right eye at bedtime.    08/12/2015 at Unknown time  . warfarin (COUMADIN) 5 MG tablet Take as directed by [facility] (Patient taking differently: Take as directed by [facility] 10mg  Thurs and 7.5mg  AODs) 50 tablet 3 08/12/2015 at Unknown time   Scheduled:  . amiodarone  200 mg Oral BID  . amLODipine  5 mg Oral Daily  .  atropine  1 drop Left Eye q12n4p  . calcium acetate  1,334 mg Oral TID WC  . cinacalcet  60 mg Oral Q supper  . docusate sodium  100 mg Oral BID  . dorzolamide-timolol  1 drop Right Eye BID  . insulin aspart  0-9 Units Subcutaneous TID WC  . latanoprost  1 drop Right Eye QHS  . metoprolol tartrate  25 mg Oral BID  . multivitamin  1 tablet Oral QHS  . prednisoLONE acetate  1 drop Left Eye BID  . simvastatin  20 mg Oral QPM  . tobramycin-dexamethasone  1 drop Left Eye QID  . Warfarin - Pharmacist Dosing Inpatient   Does not apply q1800   Infusions:  . diltiazem (CARDIZEM) infusion 15 mg/hr (08/13/15 1132)    Assessment: 82yo male with history of Afib on warfarin PTA presents with CP. Pharmacy is consulted to dose warfarin for atrial fibrillation. Also h/o ESRD on HD.   PTA Warfarin Dose: 10mg  Thurs and 7.5mg  AODs with last dose on 7/12.  INR therapeutic on admission 08/12/15  Today INR remains therapeutic. INR drawn this afternoon in HD = 2.71 No CBC today, no bleeding reported.   On amiod 100mg  daily PTA - Dr. Katrinka BlazingSmith is increasing amiodarone to 200 mg BID- will watch as increase in amiodarone may effect coumadin.   Goal of Therapy:  INR 2-3 Monitor platelets by anticoagulation protocol: Yes   Plan:  Warfarin 5 mg  tonight x1 Monitor s/sx of bleeding Daily INR   Noah Delaineuth Nydia Ytuarte, RPh Clinical Pharmacist Pager: [phone]  08/13/2015,2:43 PM

## 2015-08-13 NOTE — Consult Note (Signed)
ELECTROPHYSIOLOGY CONSULT NOTE    Patient ID: [name] [name] MRN: [id number], DOB/AGE: 03-13-1933 82 y.o.  Admit date: 08/12/2015 Date of Consult: 08/13/2015  Primary Physician: Katy Apo, MD Primary Cardiologist: Dr. Katrinka Blazing Requesting MD: Dr. Katrinka Blazing  Reason for Consultation: Aflutter  HPI: [name] [name] is a 82 y.o. male with PMHx of ESRF on HD, PAFib/flutter, HTN,baseline bradycardia in SR on low dose amiodarone, and hx of a PSVT ablated, unknown when, transferred from [facility] to [facility] advised by his [facility] RN to seek medical attention secondary to fast HR.  Notes reviewed mention that he has had a number of recent hospitalizations for AFib RVR converting to SR with amiodarone.  Cardiology H&P notes repeat admissions for Afib with RVR. 3/21-3/24/17 (in the setting of pneumonia and hypoxia) and 04/12/15- 04/17/15 though this hospital stay his presenting rhythm was SR during the stay had RVR. Also 04/07/15 to 04/08/15 for a fib with RVR with c/o dizziness- given IV amiodarone and then home dose increased to 200 daily and pt converted to SR.  He has also been seen to have AFlutter.  EP is being asked to see the patient for rate/rhythm control options.   The patient tells me he has never really felt palpitations, or had the sensation of his heart racing, he states the day prior to coming in at HD he was told his HR was fast and told he should go to the hospital but didn't want to feeling "fine", he came in the following day with a slight pinching type pain to his R chest, lateral to the nipple  Line, lasting a few seconds and since they had told him his HR was fast came in.  Past Medical History  Diagnosis Date  . Diabetes mellitus (HCC)   . PAF (paroxysmal atrial fibrillation) (HCC)   . Hypertension   . Glaucoma   . Peripheral neuropathy (HCC)   . Gout   . Anemia   . ESRD (end stage renal disease) on dialysis [facility])     "MWF; [address]" (04/08/2015)  . Atrial flutter (HCC)   . PSVT  (paroxysmal supraventricular tachycardia) (HCC)     a. h/o ablation.  . Bradycardia     a. unable to tolerate beta blockers in the past.     Surgical History:  Past Surgical History  Procedure Laterality Date  . Ablation of dysrhythmic focus    . Sp av dialysis shunt access existing *l*       Prescriptions prior to admission  Medication Sig Dispense Refill Last Dose  . acetaminophen (TYLENOL) 325 MG tablet Take 650 mg by mouth daily as needed for mild pain.   08/12/2015 at Unknown time  . albuterol (PROVENTIL HFA;VENTOLIN HFA) 108 (90 Base) MCG/ACT inhaler Inhale 1-2 puffs into the lungs every 6 (six) hours as needed for wheezing or shortness of breath.    08/12/2015 at Unknown time  . amiodarone (PACERONE) 200 MG tablet Take 100 mg by mouth daily.   08/12/2015 at Unknown time  . amLODipine (NORVASC) 5 MG tablet Take 1 tablet (5 mg total) by mouth daily. 90 tablet 3 08/12/2015 at Unknown time  . atropine 1 % ophthalmic solution Place 1 drop into the left eye 2 times daily at 12 noon and 4 pm.   08/12/2015 at Unknown time  . Calcium Acetate 667 MG TABS Take 1-2 capsules by mouth 3 (three) times daily. 667 mg for snacks, 1334 mg for meals   08/12/2015 at Unknown time  .  docusate sodium (COLACE) 100 MG capsule Take 100 mg by mouth 2 (two) times daily.   08/12/2015 at Unknown time  . dorzolamide-timolol (COSOPT) 22.3-6.8 MG/ML ophthalmic solution Place 1 drop into the right eye 2 (two) times daily.    08/12/2015 at Unknown time  . folic acid-vitamin b complex-vitamin c-selenium-zinc (DIALYVITE) 3 MG TABS tablet Take 1 tablet by mouth daily.   08/12/2015 at Unknown time  . lidocaine-prilocaine (EMLA) cream Apply 1 application topically as needed (apply to skin near dialysis port before dialysis).    08/12/2015 at Unknown time  . Multiple Vitamin (MULTIVITAMIN WITH MINERALS) TABS tablet Take 1 tablet by mouth daily. 30 tablet 0 08/12/2015 at Unknown time  . prednisoLONE acetate (PRED FORTE) 1 % ophthalmic  suspension Place 1 drop into the left eye 2 (two) times daily. Patient to use for 37 days. Filled on 03-28-15   08/12/2015 at Unknown time  . SENSIPAR 60 MG tablet Take 60 mg by mouth at bedtime.    08/12/2015 at Unknown time  . simvastatin (ZOCOR) 20 MG tablet Take 20 mg by mouth every evening.   08/12/2015 at Unknown time  . tobramycin-dexamethasone (TOBRADEX) ophthalmic solution Place 1 drop into the left eye 4 (four) times daily. For 37 days. Filled 03-31-15   08/12/2015 at Unknown time  . TRAVATAN Z 0.004 % SOLN ophthalmic solution Place 1 drop into the right eye at bedtime.    08/12/2015 at Unknown time  . warfarin (COUMADIN) 5 MG tablet Take as directed by [facility] (Patient taking differently: Take as directed by [facility] 10mg  Thurs and 7.5mg  AODs) 50 tablet 3 08/12/2015 at Unknown time    Inpatient Medications:  . amiodarone  200 mg Oral BID  . amLODipine  5 mg Oral Daily  . atropine  1 drop Left Eye q12n4p  . calcium acetate  1,334 mg Oral TID WC  . cinacalcet  60 mg Oral Q supper  . docusate sodium  100 mg Oral BID  . dorzolamide-timolol  1 drop Right Eye BID  . insulin aspart  0-9 Units Subcutaneous TID WC  . latanoprost  1 drop Right Eye QHS  . metoprolol tartrate  25 mg Oral BID  . multivitamin  1 tablet Oral QHS  . prednisoLONE acetate  1 drop Left Eye BID  . simvastatin  20 mg Oral QPM  . tobramycin-dexamethasone  1 drop Left Eye QID  . Warfarin - Pharmacist Dosing Inpatient   Does not apply q1800    Allergies:  Allergies  Allergen Reactions  . Diltiazem Other (See Comments)    SEVERE STOMACH CRAMPING PER PT    Social History   Social History  . Marital Status: Married    Spouse Name: N/A  . Number of Children: N/A  . Years of Education: N/A   Occupational History  . Not on file.   Social History Main Topics  . Smoking status: Never Smoker   . Smokeless tobacco: Never Used  . Alcohol Use: No  . Drug Use: No  . Sexual Activity: Not on file    Other Topics Concern  . Not on file   Social History Narrative     Family History  Problem Relation Age of Onset  . Alcoholism Brother   . Alcoholism Brother   . Other Mother     bowel obstruction  . Heart failure Father     fluid bluid up     Review of Systems: All other systems reviewed and are otherwise  negative except as noted above.  Physical Exam: Filed Vitals:   08/13/15 1209 08/13/15 1230 08/13/15 1300 08/13/15 1330  BP: 133/69 135/65 115/63 132/67  Pulse: 62 63 64 64  Temp:      TempSrc:      Resp: 19 20 17 23   Height:      Weight:      SpO2:       GEN- The patient is ill appearing, alert and oriented x 3 today.   HEENT: normocephalic, atraumatic; sclera clear, L eye opaque; hearing intact; no teeth; neck supple, no JVP Lymph- no cervical lymphadenopathy Lungs- Clear to ausculation bilaterally, normal work of breathing.  No wheezes, rales, rhonchi Heart- Irregular rate and rhythm, no significant murmurs, rubs or gallops, PMI not laterally displaced GI- soft, non-tender, non-distended Extremities- no clubbing, cyanosis, or edema MS- no significant deformity or atrophy Skin- warm and dry, no rash or lesion Psych- euthymic mood, full affect Neuro- no gross deficits observed  Labs:   Lab Results  Component Value Date   WBC 4.9 08/12/2015   HGB 13.1 08/12/2015   HCT 39.1 08/12/2015   MCV 87.5 08/12/2015   PLT 152 08/12/2015    Recent Labs Lab 08/13/15 1300  NA 131*  K 3.9  CL 91*  CO2 27  BUN 45*  CREATININE 10.59*  CALCIUM 8.7*  GLUCOSE 231*      Radiology/Studies:  Dg Chest 2 View 08/12/2015  CLINICAL DATA:  Right sided chest pain. No sob. Chronic cough. Hx bronchitis. Non smoker. Diab, htn, renal dz. Hx pna. EXAM: CHEST  2 VIEW COMPARISON:  05/27/2015 FINDINGS: Cardiac silhouette is normal in size and configuration. No mediastinal or hilar masses or evidence of adenopathy. Irregular interstitial opacities in the lungs are less prominent  than on the prior chest radiographs. These are likely areas of chronic parenchymal scarring. No evidence of pneumonia or pulmonary edema. No pleural effusion or pneumothorax. Bony thorax is intact. IMPRESSION: No acute cardiopulmonary disease. Electronically Signed   By: Amie Portlandavid  Ormond M.D.   On: 08/12/2015 17:39    EKG: Atypical atrial Flutter 125bpm, Aflutter 63bpm, appears atypical Historical EKGs are reviewed, all appear to be either SR with 1st degree AVBlock or AFlutter TELEMETRY: AFlutter 70's Echocardiogram this admission is pending  01/12/12: Echocardiogram Study Conclusions - Left ventricle: The cavity size was normal. There was mild concentric hypertrophy. Systolic function was normal. The estimated ejection fraction was in the range of 55% to 60%. Wall motion was normal; there were no regional wall motion abnormalities. - Mitral valve: Mild regurgitation. - Right ventricle: The cavity     CHA2DS2Vasc isi at least 3, on warfarin, INR is therpaeutic     Currently rate is controlled     Dr. Katrinka BlazingSmith mentions he has some degree of baseline bradycardia     Of late a number of hospitalizations with RVR     His record mentions hx of PAFib and flutter, though EKGs all appear to be AFlutter     The patient tells me he is not inclined to do any kind of procedures  He is chronically on amiodarone 100mg  daily, Dr. Katrinka BlazingSmith tells me his SR tends to be in the 50's, previous cardiology office note also mentions historically has had a prolonged QT  His amio up-titrated toady to 200mg  daily, also on metoprolol 25mg  daily and a diltiazem gtt Will await discussion with the patient and Dr. Ladona Ridgelaylor for POC/recommendations from EP standpoint  2. HTN     Stable  3. ESRF on HD    Signed, Maryella Shivers 08/13/2015 1:51 PM  EP Attending  Patient seen and examined. Agree with above. The patient is admitted with atypical atrial flutter and non-cardiac chest pain. He does not feel his  atrial flutter. His flutter is not right atrial. Was positive in the inferior leads and lead V1. He has been difficult to treat up until now, because of sinus node dysfunction. Exam reveals a IRIR with no murmur. Lungs are clear. Extremities without edema.  A/P 1. Atypical atrial flutter - at this point, the patient would do best if we could control his rate and abandon rhythm control. I would suggest stopping amiodarone, increasing his beta blocker and weaning the IV cardizem to off.  2. Coags - he appears to be tolerating coumadin. 3. Sinus node dysfunction - the patient is not a good candidate for a PPM. Will hope to avoid this.   Leonia Reeves.D.

## 2015-08-13 NOTE — Progress Notes (Signed)
1130  SPOKEN TO hd rn PT /inR TO BE DRAWN

## 2015-08-13 NOTE — Progress Notes (Signed)
       Patient Name: [name] [name] Date of Encounter: 08/13/2015    SUBJECTIVE: He was urged to come to the hospital because of elevated heart rates during his dialysis session on Wednesday. He denied come to the hospital at that time but [facility] and was admitted with atrial flutter with rapid ventricular response. He was minimally symptomatic. He has had prior ablation therapy for PSVT. He is on amiodarone as an outpatient, 100 mg per day.  TELEMETRY:  Atrial flutter with 4:1 AV conduction on IV  diltiazem Filed Vitals:   08/12/15 1930 08/12/15 2117 08/13/15 0054 08/13/15 0518  BP: 117/81 133/90 106/60 124/75  Pulse: 117 97 62 80  Temp:  98 F (36.7 C) 98.1 F (36.7 C) 97.9 F (36.6 C)  TempSrc:  Oral Oral Oral  Resp: 18 19 18    Height:  6\' 4"  (1.93 m)    Weight:  232 lb 2.3 oz (105.3 kg)  231 lb 3.2 oz (104.872 kg)  SpO2: 99% 100% 97% 98%    Intake/Output Summary (Last 24 hours) at 08/13/15 1212 Last data filed at 08/13/15 0859  Gross per 24 hour  Intake    480 ml  Output      1 ml  Net    479 ml   LABS: Basic Metabolic Panel:  Recent Labs  16/10/9605/13/17 1715  NA 135  K 3.6  CL 92*  CO2 31  GLUCOSE 261*  BUN 40*  CREATININE 9.60*  CALCIUM 8.6*   CBC:  Recent Labs  08/12/15 1715  WBC 4.9  NEUTROABS 2.4  HGB 13.1  HCT 39.1  MCV 87.5  PLT 152   Cardiac Enzymes:  Recent Labs  08/12/15 1715  TROPONINI 0.05*     Radiology/Studies:  No acute abnormalities noted on admitting chest x-ray  Physical Exam: Blood pressure 124/75, pulse 80, temperature 97.9 F (36.6 C), temperature source Oral, resp. rate 18, height 6\' 4"  (1.93 m), weight 231 lb 3.2 oz (104.872 kg), SpO2 98 %. Weight change:   Wt Readings from Last 3 Encounters:  08/13/15 231 lb 3.2 oz (104.872 kg)  06/21/15 218 lb 0.9 oz (98.91 kg)  06/08/15 220 lb 1.9 oz (99.846 kg)   Lying comfortably in bed. No evidence of volume overload. No JVD. No  edema. Cardiac exam reveals no gallop or rub. Neurological exam is intact.  ASSESSMENT:  1. Four recent hospitalizations over the past 3 months with tachycardia related to atrial fibrillation/atrial flutter with rapid ventricular response. This is despite antiarrhythmic therapy to attempt rhythm control. Current amiodarone dose is low, mostly because when he is in sinus rhythm there is significant bradycardia. May not be safe to increase amiodarone to 200 mg per day. 2. Prior history of PSVT with ablation 3. End-stage kidney disease on chronic dialysis. 4. Chronic severe essential hypertension.  Plan:  1. Increase amiodarone to 200 mg twice a day 2. Continue IV diltiazem 3. Will get an EP assessment. 4. Continue chronic anticoagulation therapy  Signed, Lyn RecordsHenry W Johnice Riebe [name] 08/13/2015, 12:12 PM

## 2015-08-13 NOTE — Progress Notes (Addendum)
Request received for Consult. Patient is in Observation status.  We will do full consult when admitted to inpatient status.    [name]  Last treatment was 7/12 . He ran full time Post HD HR was elevated at 126 BP in usual 110s range. Post Hd wt was 101.5 with net UF 1.9 (EDW 99) - the closest he gets to edw is 99.6. Average UF net volumes are 2.5 - 3.9.  Usual dialysis orders are MWF NE 4 hr EDW 99 450/A  2 Ca 2 K 1.5 heparin 4200 left upper AVF no ESA or Fe heparin 4200.  K 3.6 7/13 hgb 13 CXR clear on admission - I think EDW can be raised at d/c to at least 100 - 100.5  Hemodialysis order written for today.  Bard HerbertMarty Malanie Koloski, PA-C beeepr [phone]

## 2015-08-14 ENCOUNTER — Observation Stay (HOSPITAL_BASED_OUTPATIENT_CLINIC_OR_DEPARTMENT_OTHER): Payer: Medicare Other

## 2015-08-14 DIAGNOSIS — I484 Atypical atrial flutter: Secondary | ICD-10-CM

## 2015-08-14 DIAGNOSIS — R9431 Abnormal electrocardiogram [ECG] [EKG]: Secondary | ICD-10-CM | POA: Diagnosis not present

## 2015-08-14 DIAGNOSIS — Z7901 Long term (current) use of anticoagulants: Secondary | ICD-10-CM | POA: Diagnosis not present

## 2015-08-14 DIAGNOSIS — I119 Hypertensive heart disease without heart failure: Secondary | ICD-10-CM | POA: Diagnosis not present

## 2015-08-14 DIAGNOSIS — I10 Essential (primary) hypertension: Secondary | ICD-10-CM | POA: Diagnosis not present

## 2015-08-14 LAB — GLUCOSE, CAPILLARY
GLUCOSE-CAPILLARY: 169 mg/dL — AB (ref 65–99)
Glucose-Capillary: 301 mg/dL — ABNORMAL HIGH (ref 65–99)
Glucose-Capillary: 143 mg/dL — ABNORMAL HIGH (ref 65–99)
Glucose-Capillary: 174 mg/dL — ABNORMAL HIGH (ref 65–99)

## 2015-08-14 LAB — CBC
HCT: 39.1 % (ref 39.0–52.0)
Hemoglobin: 12.9 g/dL — ABNORMAL LOW (ref 13.0–17.0)
MCH: 28.9 pg (ref 26.0–34.0)
MCHC: 33 g/dL (ref 30.0–36.0)
MCV: 87.7 fL (ref 78.0–100.0)
PLATELETS: 146 10*3/uL — AB (ref 150–400)
RBC: 4.46 MIL/uL (ref 4.22–5.81)
RDW: 15.5 % (ref 11.5–15.5)
WBC: 4.9 10*3/uL (ref 4.0–10.5)

## 2015-08-14 LAB — ECHOCARDIOGRAM COMPLETE
HEIGHTINCHES: 76 in
WEIGHTICAEL: 3601.43 [oz_av]

## 2015-08-14 LAB — PROTIME-INR
INR: 2.73 — ABNORMAL HIGH (ref 0.00–1.49)
PROTHROMBIN TIME: 28.5 s — AB (ref 11.6–15.2)

## 2015-08-14 MED ORDER — WARFARIN SODIUM 10 MG PO TABS: 10.0000 mg | ORAL_TABLET | ORAL | Status: DC

## 2015-08-14 MED ORDER — WARFARIN SODIUM 7.5 MG PO TABS: 7.5000 mg | ORAL_TABLET | Freq: Once | ORAL | Status: DC

## 2015-08-14 MED ORDER — DILTIAZEM HCL ER COATED BEADS 180 MG PO CP24
180.0000 mg | ORAL_CAPSULE | Freq: Every day | ORAL | Status: DC
  Administered 2015-08-14 – 2015-08-15 (×2): 180 mg via ORAL
  Filled 2015-08-14 (×2): qty 1

## 2015-08-14 MED ORDER — WARFARIN SODIUM 7.5 MG PO TABS
7.5000 mg | ORAL_TABLET | ORAL | Status: DC
  Administered 2015-08-14: 7.5 mg via ORAL
  Filled 2015-08-14: qty 1

## 2015-08-14 MED ORDER — ATORVASTATIN CALCIUM 10 MG PO TABS
10.0000 mg | ORAL_TABLET | Freq: Every day | ORAL | Status: DC
  Administered 2015-08-14: 10 mg via ORAL
  Filled 2015-08-14: qty 1

## 2015-08-14 NOTE — Progress Notes (Signed)
  Echocardiogram 2D Echocardiogram has been performed.  Arvil ChacoFoster, Zoila Ditullio 08/14/2015, 10:05 AM

## 2015-08-14 NOTE — Progress Notes (Signed)
ANTICOAGULATION CONSULT NOTE -Follow up  Pharmacy Consult for Warfarin Indication: atrial fibrillation  Allergies  Allergen Reactions  . Diltiazem Other (See Comments)    SEVERE STOMACH CRAMPING PER PT    Patient Measurements: Height:  (193 cm) Weight: 225 lb 1.4 oz (102.1 kg) IBW/kg (Calculated) : 86.8 Heparin Dosing Weight:   Vital Signs: Temp: 97.7 F (36.5 C) (07/15 0600) Temp Source: Oral (07/15 0600) BP: 120/73 mmHg (07/15 0600) Pulse Rate: 64 (07/15 0600)  Labs:  Recent Labs  08/12/15 08/12/15 1715 08/13/15 1300  HGB  --  13.1  --   HCT  --  39.1  --   PLT  --  152  --   LABPROT  --  25.4* 28.3*  INR 2.3 2.34* 2.71*  CREATININE  --  9.60* 10.59*  TROPONINI  --  0.05*  --     Estimated Creatinine Clearance: 6.9 mL/min (by C-G formula based on Cr of 10.59).   Medical History: Past Medical History  Diagnosis Date  . Diabetes mellitus (HCC)   . PAF (paroxysmal atrial fibrillation) (HCC)   . Hypertension   . Glaucoma   . Peripheral neuropathy (HCC)   . Gout   . Anemia   . ESRD (end stage renal disease) on dialysis [place])     "MWF; [address]" (04/08/2015)  . Atrial flutter (HCC)   . PSVT (paroxysmal supraventricular tachycardia) (HCC)     a. h/o ablation.  . Bradycardia     a. unable to tolerate beta blockers in the past.    Medications:  Prescriptions prior to admission  Medication Sig Dispense Refill Last Dose  . acetaminophen (TYLENOL) 325 MG tablet Take 650 mg by mouth daily as needed for mild pain.   08/12/2015 at Unknown time  . albuterol (PROVENTIL HFA;VENTOLIN HFA) 108 (90 Base) MCG/ACT inhaler Inhale 1-2 puffs into the lungs every 6 (six) hours as needed for wheezing or shortness of breath.    08/12/2015 at Unknown time  . amiodarone (PACERONE) 200 MG tablet Take 100 mg by mouth daily.   08/12/2015 at Unknown time  . amLODipine (NORVASC) 5 MG tablet Take 1 tablet (5 mg total) by mouth daily. 90 tablet 3 08/12/2015 at Unknown time   . atropine 1 % ophthalmic solution Place 1 drop into the left eye 2 times daily at 12 noon and 4 pm.   08/12/2015 at Unknown time  . Calcium Acetate 667 MG TABS Take 1-2 capsules by mouth 3 (three) times daily. 667 mg for snacks, 1334 mg for meals   08/12/2015 at Unknown time  . docusate sodium (COLACE) 100 MG capsule Take 100 mg by mouth 2 (two) times daily.   08/12/2015 at Unknown time  . dorzolamide-timolol (COSOPT) 22.3-6.8 MG/ML ophthalmic solution Place 1 drop into the right eye 2 (two) times daily.    08/12/2015 at Unknown time  . folic acid-vitamin b complex-vitamin c-selenium-zinc (DIALYVITE) 3 MG TABS tablet Take 1 tablet by mouth daily.   08/12/2015 at Unknown time  . lidocaine-prilocaine (EMLA) cream Apply 1 application topically as needed (apply to skin near dialysis port before dialysis).    08/12/2015 at Unknown time  . Multiple Vitamin (MULTIVITAMIN WITH MINERALS) TABS tablet Take 1 tablet by mouth daily. 30 tablet 0 08/12/2015 at Unknown time  . prednisoLONE acetate (PRED FORTE) 1 % ophthalmic suspension Place 1 drop into the left eye 2 (two) times daily. Patient to use for 37 days. Filled on 03-28-15   08/12/2015 at Unknown  time  . SENSIPAR 60 MG tablet Take 60 mg by mouth at bedtime.    08/12/2015 at Unknown time  . simvastatin (ZOCOR) 20 MG tablet Take 20 mg by mouth every evening.   08/12/2015 at Unknown time  . tobramycin-dexamethasone (TOBRADEX) ophthalmic solution Place 1 drop into the left eye 4 (four) times daily. For 37 days. Filled 03-31-15   08/12/2015 at Unknown time  . TRAVATAN Z 0.004 % SOLN ophthalmic solution Place 1 drop into the right eye at bedtime.    08/12/2015 at Unknown time  . warfarin (COUMADIN) 5 MG tablet Take as directed by [facility] (Patient taking differently: Take as directed by [facility] 10mg  Thurs and 7.5mg  AODs) 50 tablet 3 08/12/2015 at Unknown time   Scheduled:  . amLODipine  5 mg Oral Daily  . atropine  1 drop Left Eye q12n4p  . calcium acetate   1,334 mg Oral TID WC  . cinacalcet  60 mg Oral Q supper  . docusate sodium  100 mg Oral BID  . dorzolamide-timolol  1 drop Right Eye BID  . insulin aspart  0-9 Units Subcutaneous TID WC  . latanoprost  1 drop Right Eye QHS  . metoprolol tartrate  50 mg Oral BID  . multivitamin  1 tablet Oral QHS  . prednisoLONE acetate  1 drop Left Eye BID  . simvastatin  20 mg Oral QPM  . tobramycin-dexamethasone  1 drop Left Eye QID  . warfarin  7.5 mg Oral ONCE-1800  . Warfarin - Pharmacist Dosing Inpatient   Does not apply q1800   Infusions:  . diltiazem (CARDIZEM) infusion 7.5 mg/hr (08/14/15 0705)    Assessment: 82yo male with history of Afib on warfarin PTA presents with CP. Pharmacy is consulted to dose warfarin for atrial fibrillation. Also h/o ESRD on HD.   PTA Warfarin Dose: 10mg  Thurs and 7.5mg  AODs with last dose on 7/12.  INR is 2.71 today. Amio got dced yesterday  Goal of Therapy:  INR 2-3 Monitor platelets by anticoagulation protocol: Yes   Plan:   Warfarin 7.5mg  qday except 10mg  thurs Monitor s/sx of bleeding Daily INR   Ulyses SouthwardMinh Pham, PharmD Pager: [phone] 08/14/2015 10:37 AM

## 2015-08-14 NOTE — Progress Notes (Addendum)
Patient Name: [name] [name] Date of Encounter: 08/14/2015  Active Problems:   Atrial flutter (HCC)   Length of Stay:   SUBJECTIVE: The patient feels better today, denies chest pain, SOB, he would like to go home.  CURRENT MEDS . amLODipine  5 mg Oral Daily  . atropine  1 drop Left Eye q12n4p  . calcium acetate  1,334 mg Oral TID WC  . cinacalcet  60 mg Oral Q supper  . docusate sodium  100 mg Oral BID  . dorzolamide-timolol  1 drop Right Eye BID  . insulin aspart  0-9 Units Subcutaneous TID WC  . latanoprost  1 drop Right Eye QHS  . metoprolol tartrate  50 mg Oral BID  . multivitamin  1 tablet Oral QHS  . prednisoLONE acetate  1 drop Left Eye BID  . simvastatin  20 mg Oral QPM  . tobramycin-dexamethasone  1 drop Left Eye QID  . [START ON 08/19/2015] warfarin  10 mg Oral Once per day on Thu  . warfarin  7.5 mg Oral Once per day on Sun Mon Tue Wed Fri Sat  . Warfarin - Pharmacist Dosing Inpatient   Does not apply q1800   . diltiazem (CARDIZEM) infusion 7.5 mg/hr (08/14/15 0705)   OBJECTIVE  Filed Vitals:   08/13/15 1600 08/13/15 1615 08/13/15 2026 08/14/15 0600  BP: 109/61 121/72 111/62 120/73  Pulse: 65 65 65 64  Temp:  97.5 F (36.4 C) 98.4 F (36.9 C) 97.7 F (36.5 C)  TempSrc:  Oral Oral Oral  Resp: 19 18 18 18   Height:      Weight:  226 lb 3.1 oz (102.6 kg)  225 lb 1.4 oz (102.1 kg)  SpO2:  96% 98% 100%    Intake/Output Summary (Last 24 hours) at 08/14/15 1113 Last data filed at 08/13/15 2000  Gross per 24 hour  Intake    240 ml  Output   2500 ml  Net  -2260 ml   Filed Weights   08/13/15 1157 08/13/15 1615 08/14/15 0600  Weight: 233 lb 0.4 oz (105.7 kg) 226 lb 3.1 oz (102.6 kg) 225 lb 1.4 oz (102.1 kg)    PHYSICAL EXAM  General: Pleasant, NAD. Neuro: Alert and oriented X 3. Moves all extremities spontaneously. Psych: Normal affect. HEENT:  Normal  Neck: Supple without bruits or JVD. Lungs:  Resp regular and unlabored, CTA. Heart: RRR no s3,  s4, or murmurs. Abdomen: Soft, non-tender, non-distended, BS + x 4.  Extremities: No clubbing, cyanosis or edema. DP/PT/Radials 2+ and equal bilaterally.  Accessory Clinical Findings  CBC  Recent Labs  08/12/15 1715  WBC 4.9  NEUTROABS 2.4  HGB 13.1  HCT 39.1  MCV 87.5  PLT 152   Basic Metabolic Panel  Recent Labs  08/12/15 1715 08/13/15 1300  NA 135 131*  K 3.6 3.9  CL 92* 91*  CO2 31 27  GLUCOSE 261* 231*  BUN 40* 45*  CREATININE 9.60* 10.59*  CALCIUM 8.6* 8.7*  PHOS  --  4.7*   Liver Function Tests  Recent Labs  08/13/15 1300  ALBUMIN 3.1*    Recent Labs  08/12/15 1715  TROPONINI 0.05*   Radiology/Studies  Dg Chest 2 View  08/12/2015  CLINICAL DATA:  Right sided chest pain. No sob. Chronic cough. Hx bronchitis. Non smoker. Diab, htn, renal dz. Hx pna. EXAM: CHEST  2 VIEW COMPARISON:  05/27/2015 FINDINGS: Cardiac silhouette is normal in size and configuration. No mediastinal or hilar masses or evidence  of adenopathy. Irregular interstitial opacities in the lungs are less prominent than on the prior chest radiographs. These are likely areas of chronic parenchymal scarring. No evidence of pneumonia or pulmonary edema. No pleural effusion or pneumothorax. Bony thorax is intact. IMPRESSION: No acute cardiopulmonary disease. Electronically Signed   By: Amie Portland M.D.   On: 08/12/2015 17:39    TELE: a-flutter, rate controlled.     ASSESSMENT AND PLAN  1. Atypica atrial flutter - RVR now rate controlled - per Dr Ladona Ridgel: "The patient is admitted with atypical atrial flutter and non-cardiac chest pain. He does not feel his atrial flutter. His flutter is not right atrial. Was positive in the inferior leads and lead V1. He has been difficult to treat up until now, because of sinus node dysfunction. At this point, the patient would do best if we could control his rate and abandon rhythm control. He is now rate controlled on metoprolol and iv cardizem, I will  transition to po cardizem, and if rate controlled, plan for a discharge in the am. 2. Coags - he appears to be tolerating coumadin. 3. Sinus node dysfunction - the patient is not a good candidate for a PPM. Will hope to avoid this.    Signed, Tobias Alexander MD, [facility] 08/14/2015

## 2015-08-15 DIAGNOSIS — I119 Hypertensive heart disease without heart failure: Secondary | ICD-10-CM | POA: Diagnosis not present

## 2015-08-15 DIAGNOSIS — I484 Atypical atrial flutter: Secondary | ICD-10-CM | POA: Diagnosis not present

## 2015-08-15 LAB — CBC
HEMATOCRIT: 37.1 % — AB (ref 39.0–52.0)
HEMOGLOBIN: 12.1 g/dL — AB (ref 13.0–17.0)
MCH: 28.5 pg (ref 26.0–34.0)
MCHC: 32.6 g/dL (ref 30.0–36.0)
MCV: 87.5 fL (ref 78.0–100.0)
Platelets: 145 10*3/uL — ABNORMAL LOW (ref 150–400)
RBC: 4.24 MIL/uL (ref 4.22–5.81)
RDW: 15.5 % (ref 11.5–15.5)
WBC: 5.6 10*3/uL (ref 4.0–10.5)

## 2015-08-15 LAB — PROTIME-INR
INR: 2.53 — AB (ref 0.00–1.49)
PROTHROMBIN TIME: 27 s — AB (ref 11.6–15.2)

## 2015-08-15 LAB — GLUCOSE, CAPILLARY
GLUCOSE-CAPILLARY: 187 mg/dL — AB (ref 65–99)
GLUCOSE-CAPILLARY: 242 mg/dL — AB (ref 65–99)

## 2015-08-15 MED ORDER — METOPROLOL TARTRATE 50 MG PO TABS: 50.0000 mg | ORAL_TABLET | Freq: Two times a day (BID) | ORAL | Status: DC

## 2015-08-15 MED ORDER — DILTIAZEM HCL ER COATED BEADS 240 MG PO CP24: 240.0000 mg | ORAL_CAPSULE | Freq: Every day | ORAL | Status: DC

## 2015-08-15 MED ORDER — ATORVASTATIN CALCIUM 10 MG PO TABS: 10.0000 mg | ORAL_TABLET | Freq: Every day | ORAL | Status: AC

## 2015-08-15 NOTE — Discharge Summary (Signed)
Pt got discharged to home, discharge instructions provided and patient showed understanding to it, IV taken out,Telemonitor DC,pt left unit in wheelchair with all of the belongings accompanied with a family member (wife) 

## 2015-08-15 NOTE — Progress Notes (Signed)
ANTICOAGULATION CONSULT NOTE -Follow up  Pharmacy Consult for Warfarin Indication: atrial fibrillation  Allergies  Allergen Reactions  . Diltiazem Other (See Comments)    SEVERE STOMACH CRAMPING PER PT    Patient Measurements: Height: 6\' 4"  (193 cm) Weight: 227 lb 3.2 oz (103.057 kg) IBW/kg (Calculated) : 86.8 Heparin Dosing Weight:   Vital Signs: Temp: 98 F (36.7 C) (07/16 0653) Temp Source: Oral (07/16 0653) BP: 134/85 mmHg (07/16 0653) Pulse Rate: 92 (07/16 0653)  Labs:  Recent Labs  08/12/15 1715 08/13/15 1300 08/14/15 1220 08/15/15 0210  HGB 13.1  --  12.9* 12.1*  HCT 39.1  --  39.1 37.1*  PLT 152  --  146* 145*  LABPROT 25.4* 28.3* 28.5* 27.0*  INR 2.34* 2.71* 2.73* 2.53*  CREATININE 9.60* 10.59*  --   --   TROPONINI 0.05*  --   --   --     Estimated Creatinine Clearance: 6.9 mL/min (by C-G formula based on Cr of 10.59).   Medical History: Past Medical History  Diagnosis Date  . Diabetes mellitus (HCC)   . PAF (paroxysmal atrial fibrillation) (HCC)   . Hypertension   . Glaucoma   . Peripheral neuropathy (HCC)   . Gout   . Anemia   . ESRD (end stage renal disease) on dialysis [facility](HCC)     "MWF; [address]" (04/08/2015)  . Atrial flutter (HCC)   . PSVT (paroxysmal supraventricular tachycardia) (HCC)     a. h/o ablation.  . Bradycardia     a. unable to tolerate beta blockers in the past.    Medications:  Prescriptions prior to admission  Medication Sig Dispense Refill Last Dose  . acetaminophen (TYLENOL) 325 MG tablet Take 650 mg by mouth daily as needed for mild pain.   08/12/2015 at Unknown time  . albuterol (PROVENTIL HFA;VENTOLIN HFA) 108 (90 Base) MCG/ACT inhaler Inhale 1-2 puffs into the lungs every 6 (six) hours as needed for wheezing or shortness of breath.    unkn  . amiodarone (PACERONE) 200 MG tablet Take 100 mg by mouth daily.   08/12/2015 at Unknown time  . amLODipine (NORVASC) 5 MG tablet Take 1 tablet (5 mg total) by mouth  daily. 90 tablet 3 08/12/2015 at Unknown time  . atropine 1 % ophthalmic solution Place 1 drop into the left eye 2 times daily at 12 noon and 4 pm.   08/12/2015 at Unknown time  . Calcium Acetate 667 MG TABS Take 1-2 capsules by mouth 3 (three) times daily. 667 mg for snacks, 1334 mg for meals   08/12/2015 at Unknown time  . docusate sodium (COLACE) 100 MG capsule Take 100 mg by mouth 2 (two) times daily.   08/12/2015 at Unknown time  . dorzolamide-timolol (COSOPT) 22.3-6.8 MG/ML ophthalmic solution Place 1 drop into the right eye 2 (two) times daily.    08/12/2015 at Unknown time  . folic acid-vitamin b complex-vitamin c-selenium-zinc (DIALYVITE) 3 MG TABS tablet Take 1 tablet by mouth daily.   08/12/2015 at Unknown time  . lidocaine-prilocaine (EMLA) cream Apply 1 application topically as needed (apply to skin near dialysis port before dialysis).    08/12/2015 at Unknown time  . Multiple Vitamin (MULTIVITAMIN WITH MINERALS) TABS tablet Take 1 tablet by mouth daily. 30 tablet 0 08/12/2015 at Unknown time  . prednisoLONE acetate (PRED FORTE) 1 % ophthalmic suspension Place 1 drop into the left eye 2 (two) times daily. Patient to use for 37 days. Filled on 03-28-15  08/12/2015 at Unknown time  . SENSIPAR 60 MG tablet Take 60 mg by mouth at bedtime.    08/12/2015 at Unknown time  . simvastatin (ZOCOR) 20 MG tablet Take 20 mg by mouth every evening.   08/12/2015 at Unknown time  . tobramycin-dexamethasone (TOBRADEX) ophthalmic solution Place 1 drop into the left eye 4 (four) times daily. For 37 days. Filled 03-31-15   08/12/2015 at Unknown time  . TRAVATAN Z 0.004 % SOLN ophthalmic solution Place 1 drop into the right eye at bedtime.    08/12/2015 at Unknown time  . warfarin (COUMADIN) 5 MG tablet Take as directed by [facility] (Patient taking differently: Take as directed by Coumadin Clinic  Thurs and 7.5mg  AODs) 50 tablet 3 08/12/2015   Scheduled:  . amLODipine  5 mg Oral Daily  . atorvastatin  10 mg Oral  q1800  . atropine  1 drop Left Eye q12n4p  . calcium acetate  1,334 mg Oral TID WC  . cinacalcet  60 mg Oral Q supper  . [START ON 08/16/2015] diltiazem  240 mg Oral Daily  . docusate sodium  100 mg Oral BID  . dorzolamide-timolol  1 drop Right Eye BID  . insulin aspart  0-9 Units Subcutaneous TID WC  . latanoprost  1 drop Right Eye QHS  . metoprolol tartrate  50 mg Oral BID  . multivitamin  1 tablet Oral QHS  . prednisoLONE acetate  1 drop Left Eye BID  . tobramycin-dexamethasone  1 drop Left Eye QID  . [START ON 08/19/2015] warfarin  10 mg Oral Once per day on Thu  . warfarin  7.5 mg Oral Once per day on Sun Mon Tue Wed Fri Sat  . Warfarin - Pharmacist Dosing Inpatient   Does not apply q1800   Infusions:     Assessment: 82yo male with history of Afib on warfarin PTA presents with CP. Pharmacy is consulted to dose warfarin for atrial fibrillation. Also h/o ESRD on HD. INR is therapeutic again. Plan to dc today. Since his amio has been dced, prob will need to monitor INR more closely outpt.   PTA Warfarin Dose:  Thurs and 7.5mg  AODs with last dose on 7/12.  INR is 2.53 today. Amio got dced yesterday.   Goal of Therapy:  INR 2-3 Monitor platelets by anticoagulation protocol: Yes   Plan:   Warfarin 7.5mg  qday except  thurs Monitor s/sx of bleeding Daily INR   Ulyses Southward, PharmD Pager: [phone] 08/15/2015 10:29 AM

## 2015-08-15 NOTE — Discharge Instructions (Signed)
Heart Healthy Renal Diet  Go to Dialysis as usual  Monday Wednesday and Friday  Do not take amiodarone - we stopped it  Do not take Zocor- simvastatin we stopped  You are now on Lipitor for cholesterol so it will not interact with your medications.  Call Dr. Michaelle CopasSmith's office if any problems with the medications or any problems.

## 2015-08-15 NOTE — Progress Notes (Signed)
[facility] ROUNDING NOTE   Subjective:   Interval History:  No complaints this morning   Objective:  Vital signs in last 24 hours:  Temp:  [98 F (36.7 C)] 98 F (36.7 C) (07/16 0653) Pulse Rate:  [60-92] 92 (07/16 0653) Resp:  [18] 18 (07/16 0653) BP: (118-143)/(64-85) 134/85 mmHg (07/16 0653) SpO2:  [100 %] 100 % (07/16 0653) Weight:  [103.057 kg (227 lb 3.2 oz)] 103.057 kg (227 lb 3.2 oz) (07/16 0653)  Weight change: -2.643 kg (-5 lb 13.2 oz) Filed Weights   08/13/15 1615 08/14/15 0600 08/15/15 0653  Weight: 102.6 kg (226 lb 3.1 oz) 102.1 kg (225 lb 1.4 oz) 103.057 kg (227 lb 3.2 oz)    Intake/Output: I/O last 3 completed shifts: In: 960 [P.O.:960] Out: 200 [Urine:200]   Intake/Output this shift:     CVS- RRR RS- CTA ABD- BS present soft non-distended EXT- no edema   Basic Metabolic Panel:  Recent Labs Lab 08/12/15 1715 08/13/15 1300  NA 135 131*  K 3.6 3.9  CL 92* 91*  CO2 31 27  GLUCOSE 261* 231*  BUN 40* 45*  CREATININE 9.60* 10.59*  CALCIUM 8.6* 8.7*  PHOS  --  4.7*    Liver Function Tests:  Recent Labs Lab 08/13/15 1300  ALBUMIN 3.1*   No results for input(s): LIPASE, AMYLASE in the last 168 hours. No results for input(s): AMMONIA in the last 168 hours.  CBC:  Recent Labs Lab 08/12/15 1715 08/14/15 1220 08/15/15 0210  WBC 4.9 4.9 5.6  NEUTROABS 2.4  --   --   HGB 13.1 12.9* 12.1*  HCT 39.1 39.1 37.1*  MCV 87.5 87.7 87.5  PLT 152 146* 145*    Cardiac Enzymes:  Recent Labs Lab 08/12/15 1715  TROPONINI 0.05*    BNP: Invalid input(s): POCBNP  CBG:  Recent Labs Lab 08/14/15 0607 08/14/15 1156 08/14/15 1633 08/14/15 2136 08/15/15 0651  GLUCAP 169* 301* 143* 174* 187*    Microbiology: Results for orders placed or performed during the hospital encounter of 04/20/15  Blood culture (routine x 2)     Status: None   Collection Time: 04/20/15 11:37 AM  Result Value Ref Range Status   Specimen  Description BLOOD WRIST RIGHT  Final   Special Requests BOTTLES DRAWN AEROBIC ONLY 5CC  Final   Culture NO GROWTH 5 DAYS  Final   Report Status 04/26/2015 FINAL  Final  Blood culture (routine x 2)     Status: None   Collection Time: 04/20/15 12:22 PM  Result Value Ref Range Status   Specimen Description BLOOD RIGHT ANTECUBITAL  Final   Special Requests BOTTLES DRAWN AEROBIC AND ANAEROBIC 5CC  Final   Culture NO GROWTH 5 DAYS  Final   Report Status 04/26/2015 FINAL  Final  MRSA PCR Screening     Status: None   Collection Time: 04/21/15  3:51 AM  Result Value Ref Range Status   MRSA by PCR NEGATIVE NEGATIVE Final    Comment:        The GeneXpert MRSA Assay (FDA approved for NASAL specimens only), is one component of a comprehensive MRSA colonization surveillance program. It is not intended to diagnose MRSA infection nor to guide or monitor treatment for MRSA infections.     Coagulation Studies:  Recent Labs  08/12/15 1715 08/13/15 1300 08/14/15 1220 08/15/15 0210  LABPROT 25.4* 28.3* 28.5* 27.0*  INR 2.34* 2.71* 2.73* 2.53*    Urinalysis: No results for input(s): COLORURINE, LABSPEC, PHURINE,  GLUCOSEU, HGBUR, BILIRUBINUR, KETONESUR, PROTEINUR, UROBILINOGEN, NITRITE, LEUKOCYTESUR in the last 72 hours.  Invalid input(s): APPERANCEUR    Imaging: No results found.   Medications:     . amLODipine  5 mg Oral Daily  . atorvastatin  10 mg Oral q1800  . atropine  1 drop Left Eye q12n4p  . calcium acetate  1,334 mg Oral TID WC  . cinacalcet  60 mg Oral Q supper  . diltiazem  180 mg Oral Daily  . docusate sodium  100 mg Oral BID  . dorzolamide-timolol  1 drop Right Eye BID  . insulin aspart  0-9 Units Subcutaneous TID WC  . latanoprost  1 drop Right Eye QHS  . metoprolol tartrate  50 mg Oral BID  . multivitamin  1 tablet Oral QHS  . prednisoLONE acetate  1 drop Left Eye BID  . tobramycin-dexamethasone  1 drop Left Eye QID  . [START ON 08/19/2015] warfarin  10 mg  Oral Once per day on Thu  . warfarin  7.5 mg Oral Once per day on Sun Mon Tue Wed Fri Sat  . Warfarin - Pharmacist Dosing Inpatient   Does not apply q1800   acetaminophen, albuterol, calcium acetate, ondansetron (ZOFRAN) IV  Assessment/ Plan:   ESRD-  MWF  Dialysis    ANEMIA-  Hb 12,1  Controlled at goal   MBD-  Controlled calcium and phosphorus   HTN/VOL-  Well controlled    ACCESS-  AVF  Thrill and bruit   OTHER-  Contolled INR in goal  Atrial fibrillation--- rate controlled      LOS:  [place] W @TODAY @9 :11 AM

## 2015-08-15 NOTE — Discharge Summary (Signed)
Physician Discharge Summary       Patient ID: [name] [name] MRN: [id number] DOB/AGE: 80-10-1933 80 y.o.  Admit date: 08/12/2015 Discharge date: 08/15/2015 Primary Cardiologist:Dr. Katrinka Blazing  EP; Dr. Ladona Ridgel  Discharge Diagnoses:  Principal Problem:   Atrial fibrillation with RVR [facility]) Active Problems:   Atrial flutter (HCC)   Hypertension   Diabetes mellitus with ESRD (end-stage renal disease) (HCC)   ESRD (end stage renal disease) (HCC)   Sinus bradycardia-tachycardia syndrome [facility])   Discharged Condition: good  Procedures: ECHO 08/14/15 Study Conclusions  - Left ventricle: The cavity size was normal. Wall thickness was  increased in a pattern of mild LVH. Systolic function was normal.  The estimated ejection fraction was in the range of 60% to 65%.  Indeterminant diastolic function (atrial fibrillation). Wall  motion was normal; there were no regional wall motion  abnormalities. - Aortic valve: Trileaflet; moderately calcified leaflets.  Transvalvular velocity was minimally increased. There was mild  stenosis. There was trivial regurgitation. Mean gradient (S): 11  mm Hg. Valve area (VTI): 1.51 cm^2. Valve area (Vmax): 1.57 cm^2.  Valve area (Vmean): 1.54 cm^2. - Mitral valve: Mildly calcified annulus. There was trivial  regurgitation. - Left atrium: The atrium was moderately dilated. - Right ventricle: The cavity size was normal. Systolic function  was normal. - Right atrium: The atrium was moderately dilated. - Pulmonary arteries: No complete TR doppler jet so unable to  estimate PA systolic pressure. - Inferior vena cava: The vessel was normal in size. The  respirophasic diameter changes were in the normal range (>= 50%),  consistent with normal central venous pressure.  Impressions:  - Normal LV size with mild LV hypertrophy. EF 60-65%. Normal RV  size and systolic function. Mild aortic stenosis. Moderate  biatrial enlargement.  Hospital  Course: 82 YOM with a PMH of ESRD, Afib with RVR, HTN, DM and repeat admissions for Afib with RVR. 3/21-3/24/17 and 04/12/15- 04/17/15. Also 04/07/15 to 04/08/15 for a fib with RVR- given IV amiodarone and then home dose increased to 200 daily and pt converted to SR .Marland Kitchen On initial hospitalization 3/13 he was in SR and went in to a flutter with RVR. Placed on IV amiodarone and converted to SR. Previous hx of PAF.and hx of ablation for PSVT. Chadvasc score 5. On last hospitalization he maintained SR. Last admission with PNA and hypoxic resp failure.   He was transferred by [facility] after pt presented with elevated HR HR 120s on 08/12/15.  He was started on IV dilt and rate decreased to 100 and was in a fib at that time. His INR was therapeutic on his home dose of coumadin.  He was admitted to tele.    Notes reviewed mention that he has had a number of recent hospitalizations for AFib RVR converting to SR with amiodarone. Cardiology H&P notes repeat admissions for Afib with RVR. 3/21-3/24/17 (in the setting of pneumonia and hypoxia) and 04/12/15- 04/17/15 though this hospital stay his presenting rhythm was SR during the stay had RVR. Also 04/07/15 to 04/08/15 for a fib with RVR with c/o dizziness- given IV amiodarone and then home dose increased to 200 daily and pt converted to SR. He has also been seen to have AFlutter.  During hospitalization Dr. Katrinka Blazing asked for EP consult and Dr. Ladona Ridgel saw the pt.   Per Dr. Ladona Ridgel "The patient is admitted with atypical atrial flutter and non-cardiac chest pain. He does not feel his atrial flutter. His flutter is not right atrial. Was  positive in the inferior leads and lead V1. He has been difficult to treat up until now, because of sinus node dysfunction. Exam reveals a IRIR with no murmur. Lungs are clear. Extremities without edema.  A/P 1. Atypical atrial flutter - at this point, the patient would do best if we could control his rate and abandon rhythm control. I would suggest  stopping amiodarone, increasing his beta blocker and weaning the IV cardizem to off.  2. Coags - he appears to be tolerating coumadin. 3. Sinus node dysfunction - the patient is not a good candidate for a PPM. Will hope to avoid this."  His amiodarone has been stopped and pt is stable in a flutter with rate control.  His INR at discharge 2.53.   Echo results as above.    During stay pt had HD per Nephrology team.  His usual days are MWF and he will return tomorrow to usual outpt site.  Was seen by Dr. Hyman Hopes prior to discharge.   He was seen and fund stable by Dr. Delton See and will need follow up within the next 2 weeks. The office will call him. He is now on cardizem and BB for rate control.    He is also on amlodipine and Dr. Delton See is aware but his BP is controlled with both.    We changed his zocor to lipitor to prevent any interactions.   In the past pt had abd cramps on cardizem- we will need to monitor.    cardiology, Nephrology  Significant Diagnostic Studies:  BMP Latest Ref Rng 08/13/2015 08/12/2015 04/23/2015  Glucose 65 - 99 mg/dL 161(W) 960(A) 540(J)  BUN 6 - 20 mg/dL 81(X) 91(Y) 78(G)  Creatinine 0.61 - 1.24 mg/dL 95.62(Z) 3.08(M) 5.78(I)  Sodium 135 - 145 mmol/L 131(L) 135 129(L)  Potassium 3.5 - 5.1 mmol/L 3.9 3.6 5.0  Chloride 101 - 111 mmol/L 91(L) 92(L) 92(L)  CO2 22 - 32 mmol/L 27 31 25   Calcium 8.9 - 10.3 mg/dL 6.9(G) 2.9(B) 9.9   CBC Latest Ref Rng 08/15/2015 08/14/2015 08/12/2015  WBC 4.0 - 10.5 K/uL 5.6 4.9 4.9  Hemoglobin 13.0 - 17.0 g/dL 12.1(L) 12.9(L) 13.1  Hematocrit 39.0 - 52.0 % 37.1(L) 39.1 39.1  Platelets 150 - 400 K/uL 145(L) 146(L) 152   INR 2.53 at discharge  Troponin 0.05, mildly elevated with renal failure and tachycardia.  BNP 276.9  CHEST 2 VIEW  COMPARISON: 05/27/2015  FINDINGS: Cardiac silhouette is normal in size and configuration. No mediastinal or hilar masses or evidence of adenopathy.  Irregular interstitial opacities in the  lungs are less prominent than on the prior chest radiographs. These are likely areas of chronic parenchymal scarring. No evidence of pneumonia or pulmonary edema. No pleural effusion or pneumothorax.  Bony thorax is intact.  IMPRESSION: No acute cardiopulmonary disease.   Discharge Exam: Blood pressure 134/85, pulse 92, temperature 98 F (36.7 C), temperature source Oral, resp. rate 18, height 6\' 4"  (1.93 m), weight 227 lb 3.2 oz (103.057 kg), SpO2 100 %.  Disposition: 01-Home or Self Care     Medication List    STOP taking these medications        amiodarone 200 MG tablet  Commonly known as:  PACERONE     simvastatin 20 MG tablet  Commonly known as:  ZOCOR      TAKE these medications        acetaminophen 325 MG tablet  Commonly known as:  TYLENOL  Take 650 mg by mouth daily as  needed for mild pain.     albuterol 108 (90 Base) MCG/ACT inhaler  Commonly known as:  PROVENTIL HFA;VENTOLIN HFA  Inhale 1-2 puffs into the lungs every 6 (six) hours as needed for wheezing or shortness of breath.     amLODipine 5 MG tablet  Commonly known as:  NORVASC  Take 1 tablet (5 mg total) by mouth daily.     atorvastatin 10 MG tablet  Commonly known as:  LIPITOR  Take 1 tablet (10 mg total) by mouth daily at 6 PM.     atropine 1 % ophthalmic solution  Place 1 drop into the left eye 2 times daily at 12 noon and 4 pm.     Calcium Acetate 667 MG Tabs  Take 1-2 capsules by mouth 3 (three) times daily. 667 mg for snacks, 1334 mg for meals     diltiazem 240 MG 24 hr capsule  Commonly known as:  CARDIZEM CD  Take 1 capsule (240 mg total) by mouth daily.  Start taking on:  08/16/2015     docusate sodium 100 MG capsule  Commonly known as:  COLACE  Take 100 mg by mouth 2 (two) times daily.     dorzolamide-timolol 22.3-6.8 MG/ML ophthalmic solution  Commonly known as:  COSOPT  Place 1 drop into the right eye 2 (two) times daily.     folic acid-vitamin b complex-vitamin  c-selenium-zinc 3 MG Tabs tablet  Take 1 tablet by mouth daily.     lidocaine-prilocaine cream  Commonly known as:  EMLA  Apply 1 application topically as needed (apply to skin near dialysis port before dialysis).     metoprolol 50 MG tablet  Commonly known as:  LOPRESSOR  Take 1 tablet (50 mg total) by mouth 2 (two) times daily.     multivitamin with minerals Tabs tablet  Take 1 tablet by mouth daily.     prednisoLONE acetate 1 % ophthalmic suspension  Commonly known as:  PRED FORTE  Place 1 drop into the left eye 2 (two) times daily. Patient to use for 37 days. Filled on 03-28-15     SENSIPAR 60 MG tablet  Generic drug:  cinacalcet  Take 60 mg by mouth at bedtime.     TOBRADEX ophthalmic solution  Generic drug:  tobramycin-dexamethasone  Place 1 drop into the left eye 4 (four) times daily. For 37 days. Filled 03-31-15     TRAVATAN Z 0.004 % Soln ophthalmic solution  Generic drug:  Travoprost (BAK Free)  Place 1 drop into the right eye at bedtime.     warfarin 5 MG tablet  Commonly known as:  COUMADIN  Take as directed by Coumadin Clinic       Follow-up Information    Follow up with Katy ApoPOLITE,RONALD D, MD.   Specialty:  Internal Medicine   Contact information:   [address] [place] [address] [phone]       [facility] [address] [facility].   Specialty:  Cardiology   Why:  have your coumadin checked as previously planned.   Contact information:   [address], [address] [place] [id number] [phone]      Follow up with Lesleigh NoeHenry W Smith III, MD.   Specialty:  Cardiology   Why:  our office will call you for follow up appt in next 1-2 weeks.    Contact information:   [address] [place] [address] [phone]  Discharge Instructions: Heart Healthy Renal Diet  Go to Dialysis as usual  Monday Wednesday and Friday  Do not take amiodarone - we stopped it  Do not take  Zocor- simvastatin we stopped  You are now on Lipitor for cholesterol so it will not interact with your medications.  Call Dr. Michaelle Copas office if any problems with the medications or any problems.    Signed: Nada Boozer Nurse Practitioner-Certified [facility]: [facility] 08/15/2015, 10:36 AM  Time spent on discharge : > 35 minutes.

## 2015-08-15 NOTE — Progress Notes (Signed)
Patient Name: [name] [name] Date of Encounter: 08/15/2015  Active Problems:   Atrial flutter (HCC)   Length of Stay:   SUBJECTIVE: The patient feels well today, denies palpitations or dizziness, no SOB or CP.  CURRENT MEDS . amLODipine  5 mg Oral Daily  . atorvastatin  10 mg Oral q1800  . atropine  1 drop Left Eye q12n4p  . calcium acetate  1,334 mg Oral TID WC  . cinacalcet  60 mg Oral Q supper  . diltiazem  180 mg Oral Daily  . docusate sodium  100 mg Oral BID  . dorzolamide-timolol  1 drop Right Eye BID  . insulin aspart  0-9 Units Subcutaneous TID WC  . latanoprost  1 drop Right Eye QHS  . metoprolol tartrate  50 mg Oral BID  . multivitamin  1 tablet Oral QHS  . prednisoLONE acetate  1 drop Left Eye BID  . tobramycin-dexamethasone  1 drop Left Eye QID  . [START ON 08/19/2015] warfarin  10 mg Oral Once per day on Thu  . warfarin  7.5 mg Oral Once per day on Sun Mon Tue Wed Fri Sat  . Warfarin - Pharmacist Dosing Inpatient   Does not apply q1800     OBJECTIVE  Filed Vitals:   08/14/15 0600 08/14/15 1244 08/14/15 2136 08/15/15 0653  BP: 120/73 118/64 143/85 134/85  Pulse: 64 60 65 92  Temp: 97.7 F (36.5 C) 98 F (36.7 C) 98 F (36.7 C) 98 F (36.7 C)  TempSrc: Oral Oral Oral Oral  Resp: Height:      Weight: 225 lb 1.4 oz (102.1 kg)   227 lb 3.2 oz (103.057 kg)  SpO2: 100% 100% 100% 100%    Intake/Output Summary (Last 24 hours) at 08/15/15 0903 Last data filed at 08/15/15 0500  Gross per 24 hour  Intake    480 ml  Output    200 ml  Net    280 ml   Filed Weights   08/13/15 1615 08/14/15 0600 08/15/15 0653  Weight: 226 lb 3.1 oz (102.6 kg) 225 lb 1.4 oz (102.1 kg) 227 lb 3.2 oz (103.057 kg)    PHYSICAL EXAM  General: Pleasant, NAD. Neuro: Alert and oriented X 3. Moves all extremities spontaneously. Psych: Normal affect. HEENT:  Normal  Neck: Supple without bruits or JVD. Lungs:  Resp regular and unlabored, CTA. Heart: iRRR no s3,  s4, or murmurs. Abdomen: Soft, non-tender, non-distended, BS + x 4.  Extremities: No clubbing, cyanosis or edema. DP/PT/Radials 2+ and equal bilaterally.  Accessory Clinical Findings  CBC  Recent Labs  08/12/15 1715 08/14/15 1220 08/15/15 0210  WBC 4.9 4.9 5.6  NEUTROABS 2.4  --   --   HGB 13.1 12.9* 12.1*  HCT 39.1 39.1 37.1*  MCV 87.5 87.7 87.5  PLT 152 146* 145*   Basic Metabolic Panel  Recent Labs  08/12/15 1715 08/13/15 1300  NA 135 131*  K 3.6 3.9  CL 92* 91*  CO2 31 27  GLUCOSE 261* 231*  BUN 40* 45*  CREATININE 9.60* 10.59*  CALCIUM 8.6* 8.7*  PHOS  --  4.7*   Liver Function Tests  Recent Labs  08/13/15 1300  ALBUMIN 3.1*    Recent Labs  08/12/15 1715  TROPONINI 0.05*   Radiology/Studies  Dg Chest 2 View  08/12/2015  CLINICAL DATA:  Right sided chest pain. No sob. Chronic cough. Hx bronchitis. Non smoker. Diab, htn, renal dz. Hx pna.  EXAM: CHEST  2 VIEW COMPARISON:  05/27/2015 FINDINGS: Cardiac silhouette is normal in size and configuration. No mediastinal or hilar masses or evidence of adenopathy. Irregular interstitial opacities in the lungs are less prominent than on the prior chest radiographs. These are likely areas of chronic parenchymal scarring. No evidence of pneumonia or pulmonary edema. No pleural effusion or pneumothorax. Bony thorax is intact. IMPRESSION: No acute cardiopulmonary disease. Electronically Signed   By: Amie Portlandavid  Ormond M.D.   On: 08/12/2015 17:39    TELE: a-flutter, rate controlled.     ASSESSMENT AND PLAN  1. Atypica atrial flutter - RVR now rate controlled - per Dr Ladona Ridgelaylor: "The patient is admitted with atypical atrial flutter and non-cardiac chest pain. He does not feel his atrial flutter. His flutter is not right atrial. Was positive in the inferior leads and lead V1. He has been difficult to treat up until now, because of sinus node dysfunction. At this point, the patient would do best if we could control his rate and  abandon rhythm control.  We switched from iv to PO cardizem yesterday, ventricular rate in 6590' most of the time, low 100 this am, I will increase cardizem CD to 240 mg po daily. 2. Coags - he appears to be tolerating coumadin. 3. Sinus node dysfunction - the patient is not a good candidate for a PPM. Will hope to avoid this.   Discharge home today, follow up in the clinic within the next 2 weeks.   Signed, Tobias AlexanderKatarina Dejanique Ruehl MD, [facility] 08/15/2015

## 2015-08-16 ENCOUNTER — Telehealth: Payer: Self-pay | Admitting: *Deleted

## 2015-08-24 ENCOUNTER — Ambulatory Visit (INDEPENDENT_AMBULATORY_CARE_PROVIDER_SITE_OTHER): Payer: Medicare Other | Admitting: *Deleted

## 2015-08-24 DIAGNOSIS — I4892 Unspecified atrial flutter: Secondary | ICD-10-CM | POA: Diagnosis not present

## 2015-08-24 DIAGNOSIS — I484 Atypical atrial flutter: Secondary | ICD-10-CM

## 2015-08-24 DIAGNOSIS — Z5181 Encounter for therapeutic drug level monitoring: Secondary | ICD-10-CM

## 2015-08-24 DIAGNOSIS — I4891 Unspecified atrial fibrillation: Secondary | ICD-10-CM

## 2015-08-24 LAB — POCT INR: INR: 1.5

## 2015-08-31 ENCOUNTER — Telehealth: Payer: Self-pay | Admitting: *Deleted

## 2015-08-31 ENCOUNTER — Encounter: Payer: Self-pay | Admitting: Nurse Practitioner

## 2015-08-31 ENCOUNTER — Ambulatory Visit (INDEPENDENT_AMBULATORY_CARE_PROVIDER_SITE_OTHER): Payer: Medicare Other | Admitting: Nurse Practitioner

## 2015-08-31 ENCOUNTER — Ambulatory Visit (INDEPENDENT_AMBULATORY_CARE_PROVIDER_SITE_OTHER): Payer: Medicare Other | Admitting: *Deleted

## 2015-08-31 VITALS — BP 168/90 | HR 71 | Ht 76.0 in | Wt 229.4 lb

## 2015-08-31 DIAGNOSIS — N186 End stage renal disease: Secondary | ICD-10-CM | POA: Diagnosis not present

## 2015-08-31 DIAGNOSIS — I4892 Unspecified atrial flutter: Secondary | ICD-10-CM

## 2015-08-31 DIAGNOSIS — I4891 Unspecified atrial fibrillation: Secondary | ICD-10-CM

## 2015-08-31 DIAGNOSIS — Z5181 Encounter for therapeutic drug level monitoring: Secondary | ICD-10-CM | POA: Diagnosis not present

## 2015-08-31 DIAGNOSIS — I482 Chronic atrial fibrillation, unspecified: Secondary | ICD-10-CM

## 2015-08-31 DIAGNOSIS — Z7901 Long term (current) use of anticoagulants: Secondary | ICD-10-CM

## 2015-08-31 DIAGNOSIS — E785 Hyperlipidemia, unspecified: Secondary | ICD-10-CM

## 2015-08-31 DIAGNOSIS — I484 Atypical atrial flutter: Secondary | ICD-10-CM

## 2015-08-31 DIAGNOSIS — Z992 Dependence on renal dialysis: Secondary | ICD-10-CM

## 2015-08-31 LAB — POCT INR: INR: 3.3

## 2015-08-31 MED ORDER — DILTIAZEM HCL ER COATED BEADS 360 MG PO CP24: 360.0000 mg | ORAL_CAPSULE | Freq: Every day | ORAL | 6 refills | Status: AC

## 2015-08-31 NOTE — Telephone Encounter (Signed)
S/w nurse manager at [facility], went over all pt's medication's in detail to up date.  Will fax a copy of Lori's ov note from today to [place] @ [phone]. Nurse will out to pt's house on Thursday.  Pt stated that [facility] is suppose to pick up pt's medication but [facility] stated they couldn't it would be a liability.

## 2015-08-31 NOTE — Progress Notes (Signed)
CARDIOLOGY OFFICE NOTE  Date:  08/31/2015    [name] Date of Birth: 1933/11/28 Medical Record #[id number]  PCP:  Katy Apo, MD  Cardiologist:  Tyrone Sage & Katrinka Blazing & Ladona Ridgel (EP)    Chief Complaint  Patient presents with  . Atrial Fibrillation    Post hospital visit - seen for Dr. Katrinka Blazing    History of Present Illness: [name] [name] is a 82 y.o. male who presents today for a post hospital visit. Seen for Dr. Katrinka Blazing.   He has a past medical history of ESRD, multiple episodes of Afib with RVR, HTN, DM. He has previously been on amiodarone and has a hx of ablation for PSVT. Chadvasc score 5. No mention of CAD noted.   In July of 2017, he was transferred by [facility] after pt presented with elevated HR in the 120s. He was started on IV dilt and rate decreased to 100 and was in a fib at that time. His INR was therapeutic on his home dose of coumadin.      Notes reviewed mention that he has had a number of recent hospitalizations for AFib RVR converting to SR with amiodarone. Cardiology H&P notes repeat admissions for Afib with RVR. 3/21-3/24/17 (in the setting of pneumonia and hypoxia) and 04/12/15- 04/17/15 though this hospital stay his presenting rhythm was SR during the stay had RVR. Also 04/07/15 to 04/08/15 for a fib with RVR with c/o dizziness- given IV amiodarone and then home dose increased to 200 daily and pt converted to SR. He has also been seen to have AFlutter.  During hospitalization Dr. Katrinka Blazing asked for EP consult and Dr. Ladona Ridgel saw the pt.   Dr. Ladona Ridgel recommended just rate control, stop amiodarone, increase the beta blocker and that he was not a good candidate for PPM and hope to avoid PPM implant.   It is noted that he has been on both amlodipine and Diltiazem - providers are aware. His statin was changed to Lipitor.   Comes in today. Here alone. Has been home since 7/16. Says he is "getting by". He does not understand his medicines - not sure what he is taking and  not sure what he even has. He did bring them today. His dose of Norvasc is 10 mg.  No chest pain. Breathing is ok. Coumadin to be checked today. No problems noted. He says he has a nurse coming to the house (with [facility] [phone]) - suppose to come this Thursday.   Past Medical History:  Diagnosis Date  . Anemia   . Atrial flutter (HCC)   . Bradycardia    a. unable to tolerate beta blockers in the past.  . Diabetes mellitus (HCC)   . ESRD (end stage renal disease) on dialysis [facility])    "MWF; [address]" (04/08/2015)  . Glaucoma   . Gout   . Hypertension   . PAF (paroxysmal atrial fibrillation) (HCC)   . Peripheral neuropathy (HCC)   . PSVT (paroxysmal supraventricular tachycardia) (HCC)    a. h/o ablation.    Past Surgical History:  Procedure Laterality Date  . ABLATION OF DYSRHYTHMIC FOCUS    . SP AV DIALYSIS SHUNT ACCESS EXISTING *L*       Medications: Current Outpatient Prescriptions  Medication Sig Dispense Refill  . acetaminophen (TYLENOL) 325 MG tablet Take 650 mg by mouth daily as needed for mild pain.    Marland Kitchen atorvastatin (LIPITOR) 10 MG tablet Take 1 tablet (10 mg total) by  mouth daily at 6 PM. 30 tablet 6  . atropine 1 % ophthalmic solution Place 1 drop into the left eye 2 times daily at 12 noon and 4 pm.    . Calcium Acetate 667 MG TABS Take 1-2 capsules by mouth 3 (three) times daily. 667 mg for snacks, 1334 mg for meals    . docusate sodium (COLACE) 100 MG capsule Take 100 mg by mouth 2 (two) times daily.    . dorzolamide-timolol (COSOPT) 22.3-6.8 MG/ML ophthalmic solution Place 1 drop into the right eye 2 (two) times daily.     . folic acid-vitamin b complex-vitamin c-selenium-zinc (DIALYVITE) 3 MG TABS tablet Take 1 tablet by mouth daily.    Marland Kitchen lidocaine-prilocaine (EMLA) cream Apply 1 application topically as needed (apply to skin near dialysis port before dialysis).     . metoprolol (LOPRESSOR) 50 MG tablet Take 1 tablet (50 mg total) by mouth 2 (two)  times daily. 60 tablet 6  . Multiple Vitamin (MULTIVITAMIN WITH MINERALS) TABS tablet Take 1 tablet by mouth daily. 30 tablet 0  . prednisoLONE acetate (PRED FORTE) 1 % ophthalmic suspension Place 1 drop into the left eye 2 (two) times daily. Patient to use for 37 days. Filled on 03-28-15    . SENSIPAR 60 MG tablet Take 60 mg by mouth at bedtime.     Marland Kitchen tobramycin-dexamethasone (TOBRADEX) ophthalmic solution Place 1 drop into the left eye 4 (four) times daily. For 37 days. Filled 03-31-15    . TRAVATAN Z 0.004 % SOLN ophthalmic solution Place 1 drop into the right eye at bedtime.     Marland Kitchen warfarin (COUMADIN) 5 MG tablet Take as directed by [facility] (Patient taking differently: Take as directed by [facility]  Thurs and 7.5mg  AODs) 50 tablet 3  . diltiazem (CARDIZEM CD) 360 MG 24 hr capsule Take 1 capsule (360 mg total) by mouth daily. 30 capsule 6   No current facility-administered medications for this visit.     Allergies: Allergies  Allergen Reactions  . Diltiazem Other (See Comments)    SEVERE STOMACH CRAMPING PER PT    Social History: The patient  reports that he has never smoked. He has never used smokeless tobacco. He reports that he does not drink alcohol or use drugs.   Family History: The patient's family history includes Alcoholism in his brother and brother; Heart failure in his father; Other in his mother.   Review of Systems: Please see the history of present illness.   Otherwise, the review of systems is positive for none.   All other systems are reviewed and negative.   Physical Exam: VS:  BP (!) 168/90   Pulse 71   Ht  (1.93 m)   Wt 229 lb 6.4 oz (104.1 kg)   BMI 27.92 kg/m  .  BMI Body mass index is 27.92 kg/m.  Wt Readings from Last 3 Encounters:  08/31/15 229 lb 6.4 oz (104.1 kg)  08/15/15 227 lb 3.2 oz (103.1 kg)  06/21/15 218 lb 0.9 oz (98.9 kg)    General: Pleasant. Obese black male who is alert and in no acute distress.   HEENT: Normal.   Neck: Supple, no JVD, carotid bruits, or masses noted.  Cardiac: Fairly regular rhythm. Rate ok.  Blowing systolic murmur noted. No edema.  Respiratory:  Lungs are clear to auscultation bilaterally with normal work of breathing.  GI: Soft and nontender.  MS: No deformity or atrophy. Gait and ROM intact.  Skin: Warm and  dry. Color is normal.  Neuro:  Strength and sensation are intact and no gross focal deficits noted.  Psych: Alert, appropriate and with normal affect.   LABORATORY DATA:  EKG:  EKG is ordered today. This demonstrates NSR with 1st degree AV block - reviewed with Dr. Katrinka Blazing here this morning.  Lab Results  Component Value Date   WBC 5.6 08/15/2015   HGB 12.1 (L) 08/15/2015   HCT 37.1 (L) 08/15/2015   PLT 145 (L) 08/15/2015   GLUCOSE 231 (H) 08/13/2015   ALT 22 04/21/2015   AST 22 04/21/2015   NA 131 (L) 08/13/2015   K 3.9 08/13/2015   CL 91 (L) 08/13/2015   CREATININE 10.59 (H) 08/13/2015   BUN 45 (H) 08/13/2015   CO2 27 08/13/2015   TSH 3.298 04/07/2015   INR 1.5 08/24/2015   HGBA1C 6.3 (H) 04/12/2015    BNP (last 3 results)  Recent Labs  08/12/15 2143  BNP 276.9*    ProBNP (last 3 results) No results for input(s): PROBNP in the last 8760 hours.   Other Studies Reviewed Today:  Procedures: ECHO 08/14/15 Study Conclusions  - Left ventricle: The cavity size was normal. Wall thickness was  increased in a pattern of mild LVH. Systolic function was normal.  The estimated ejection fraction was in the range of 60% to 65%.  Indeterminant diastolic function (atrial fibrillation). Wall  motion was normal; there were no regional wall motion  abnormalities. - Aortic valve: Trileaflet; moderately calcified leaflets.  Transvalvular velocity was minimally increased. There was mild  stenosis. There was trivial regurgitation. Mean gradient (S): 11  mm Hg. Valve area (VTI): 1.51 cm^2. Valve area (Vmax): 1.57 cm^2.  Valve area (Vmean): 1.54 cm^2. -  Mitral valve: Mildly calcified annulus. There was trivial  regurgitation. - Left atrium: The atrium was moderately dilated. - Right ventricle: The cavity size was normal. Systolic function  was normal. - Right atrium: The atrium was moderately dilated. - Pulmonary arteries: No complete TR doppler jet so unable to  estimate PA systolic pressure. - Inferior vena cava: The vessel was normal in size. The  respirophasic diameter changes were in the normal range (>= 50%),  consistent with normal central venous pressure.  Impressions:  - Normal LV size with mild LV hypertrophy. EF 60-65%. Normal RV  size and systolic function. Mild aortic stenosis. Moderate  biatrial enlargement.  Assessment/Plan: 1. Multiple episodes of AF/flutter - now to manage with rate control - no plans to restore NSR - yet he is NSR today. No longer on amiodarone. Discussed with Dr. Katrinka Blazing - will stop his Amlodipine - increase the Diltiazem to 360 mg today. See back with EKG in one month. He has apparently per Dr. Katrinka Blazing has had a tendency to be bradycardic - hopefully we will be able to avoid. Dr. Ladona Ridgel has wanted to avoid PPM implant.   2. Chronic anticoagulation - checking INR today  3. ESRD - on dialysis  4. HLD - now on Lipitor - recheck his labs on return OV with me in 4 weeks.   5. HTN - he is on 2 CCB's - discussed with Dr. Katrinka Blazing here in the office this morning - will stop the Norvasc - increasing the Diltiazem to 360 mg a day.   Current medicines are reviewed with the patient today.  The patient does not have concerns regarding medicines other than what has been noted above.  The following changes have been made:  See above.  Labs/ tests ordered  today include:    Orders Placed This Encounter  Procedures  . EKG 12-Lead     Disposition:   FU with me in one month with EKG and fasting labs; see Dr. Katrinka Blazing in 3 months with EKG.   Patient is agreeable to this plan and will call if any problems  develop in the interim.   Signed: Rosalio Macadamia, RN, ANP-C 08/31/2015 8:44 AM  [facility] [address] [place], [place]  [address] Phone: [phone] Fax: [phone]

## 2015-08-31 NOTE — Patient Instructions (Addendum)
We will be checking the following labs today - NONE   Medication Instructions:    Continue with your current medicines. BUT  We are stopping the Amlodipine altogether  We are increasing the Diltiazem to 360 mg a day - this is at the drug store.      Testing/Procedures To Be Arranged:  N/A  Follow-Up:   See me in one month with EKG and fasting labs  OV with Dr. Katrinka Blazing in 3 months with EKG    Other Special Instructions:   We will call [facility] to give them your new med list.     If you need a refill on your cardiac medications before your next appointment, please call your pharmacy.   Call the [facility] office at [phone] if you have any questions, problems or concerns.

## 2015-09-14 ENCOUNTER — Encounter: Payer: Self-pay | Admitting: Nurse Practitioner

## 2015-09-16 ENCOUNTER — Ambulatory Visit (INDEPENDENT_AMBULATORY_CARE_PROVIDER_SITE_OTHER): Payer: Medicare Other

## 2015-09-16 DIAGNOSIS — I484 Atypical atrial flutter: Secondary | ICD-10-CM

## 2015-09-16 DIAGNOSIS — I4891 Unspecified atrial fibrillation: Secondary | ICD-10-CM

## 2015-09-16 DIAGNOSIS — Z5181 Encounter for therapeutic drug level monitoring: Secondary | ICD-10-CM

## 2015-09-16 LAB — POCT INR: INR: 4

## 2015-09-22 ENCOUNTER — Emergency Department (HOSPITAL_COMMUNITY): Payer: Medicare Other

## 2015-09-22 ENCOUNTER — Encounter (HOSPITAL_COMMUNITY): Payer: Self-pay | Admitting: Emergency Medicine

## 2015-09-22 ENCOUNTER — Inpatient Hospital Stay (HOSPITAL_COMMUNITY)
Admission: EM | Admit: 2015-09-22 | Discharge: 2015-09-24 | DRG: 291 | Disposition: A | Discharge status: Home / Self Care | Payer: Medicare Other | Attending: Internal Medicine | Admitting: Internal Medicine

## 2015-09-22 DIAGNOSIS — I482 Chronic atrial fibrillation: Secondary | ICD-10-CM | POA: Diagnosis present

## 2015-09-22 DIAGNOSIS — I4892 Unspecified atrial flutter: Secondary | ICD-10-CM | POA: Diagnosis present

## 2015-09-22 DIAGNOSIS — R7989 Other specified abnormal findings of blood chemistry: Secondary | ICD-10-CM | POA: Diagnosis present

## 2015-09-22 DIAGNOSIS — J9601 Acute respiratory failure with hypoxia: Secondary | ICD-10-CM | POA: Diagnosis present

## 2015-09-22 DIAGNOSIS — R778 Other specified abnormalities of plasma proteins: Secondary | ICD-10-CM | POA: Diagnosis present

## 2015-09-22 DIAGNOSIS — R001 Bradycardia, unspecified: Secondary | ICD-10-CM | POA: Diagnosis present

## 2015-09-22 DIAGNOSIS — Z992 Dependence on renal dialysis: Secondary | ICD-10-CM | POA: Diagnosis not present

## 2015-09-22 DIAGNOSIS — R5381 Other malaise: Secondary | ICD-10-CM | POA: Diagnosis present

## 2015-09-22 DIAGNOSIS — D508 Other iron deficiency anemias: Secondary | ICD-10-CM | POA: Diagnosis not present

## 2015-09-22 DIAGNOSIS — N186 End stage renal disease: Secondary | ICD-10-CM

## 2015-09-22 DIAGNOSIS — D649 Anemia, unspecified: Secondary | ICD-10-CM | POA: Diagnosis present

## 2015-09-22 DIAGNOSIS — I509 Heart failure, unspecified: Secondary | ICD-10-CM | POA: Diagnosis not present

## 2015-09-22 DIAGNOSIS — E8770 Fluid overload, unspecified: Secondary | ICD-10-CM | POA: Diagnosis present

## 2015-09-22 DIAGNOSIS — Z7901 Long term (current) use of anticoagulants: Secondary | ICD-10-CM | POA: Diagnosis not present

## 2015-09-22 DIAGNOSIS — N2581 Secondary hyperparathyroidism of renal origin: Secondary | ICD-10-CM | POA: Diagnosis present

## 2015-09-22 DIAGNOSIS — I5033 Acute on chronic diastolic (congestive) heart failure: Secondary | ICD-10-CM | POA: Diagnosis present

## 2015-09-22 DIAGNOSIS — H5442 Blindness, left eye, normal vision right eye: Secondary | ICD-10-CM | POA: Diagnosis present

## 2015-09-22 DIAGNOSIS — E8889 Other specified metabolic disorders: Secondary | ICD-10-CM | POA: Diagnosis present

## 2015-09-22 DIAGNOSIS — H409 Unspecified glaucoma: Secondary | ICD-10-CM | POA: Diagnosis present

## 2015-09-22 DIAGNOSIS — I132 Hypertensive heart and chronic kidney disease with heart failure and with stage 5 chronic kidney disease, or end stage renal disease: Secondary | ICD-10-CM | POA: Diagnosis present

## 2015-09-22 DIAGNOSIS — D638 Anemia in other chronic diseases classified elsewhere: Secondary | ICD-10-CM | POA: Diagnosis present

## 2015-09-22 DIAGNOSIS — Z79899 Other long term (current) drug therapy: Secondary | ICD-10-CM

## 2015-09-22 DIAGNOSIS — I10 Essential (primary) hypertension: Secondary | ICD-10-CM

## 2015-09-22 DIAGNOSIS — E1122 Type 2 diabetes mellitus with diabetic chronic kidney disease: Secondary | ICD-10-CM | POA: Diagnosis present

## 2015-09-22 DIAGNOSIS — E1142 Type 2 diabetes mellitus with diabetic polyneuropathy: Secondary | ICD-10-CM | POA: Diagnosis present

## 2015-09-22 DIAGNOSIS — G629 Polyneuropathy, unspecified: Secondary | ICD-10-CM

## 2015-09-22 DIAGNOSIS — H5440 Blindness, one eye, unspecified eye: Secondary | ICD-10-CM | POA: Diagnosis present

## 2015-09-22 LAB — CBC WITH DIFFERENTIAL/PLATELET
Basophils Absolute: 0 10*3/uL (ref 0.0–0.1)
Basophils Relative: 0 %
Eosinophils Absolute: 0 10*3/uL (ref 0.0–0.7)
Eosinophils Relative: 0 %
HCT: 34 % — ABNORMAL LOW (ref 39.0–52.0)
Hemoglobin: 11 g/dL — ABNORMAL LOW (ref 13.0–17.0)
Lymphocytes Relative: 10 %
Lymphs Abs: 0.8 10*3/uL (ref 0.7–4.0)
MCH: 29.7 pg (ref 26.0–34.0)
MCHC: 32.4 g/dL (ref 30.0–36.0)
MCV: 91.9 fL (ref 78.0–100.0)
Monocytes Absolute: 0.9 10*3/uL (ref 0.1–1.0)
Monocytes Relative: 10 %
Neutro Abs: 6.8 10*3/uL (ref 1.7–7.7)
Neutrophils Relative %: 80 %
Platelets: 177 10*3/uL (ref 150–400)
RBC: 3.7 MIL/uL — ABNORMAL LOW (ref 4.22–5.81)
RDW: 16.7 % — ABNORMAL HIGH (ref 11.5–15.5)
WBC: 8.5 10*3/uL (ref 4.0–10.5)

## 2015-09-22 LAB — PROTIME-INR
INR: 2.6
Prothrombin Time: 28.3 seconds — ABNORMAL HIGH (ref 11.4–15.2)

## 2015-09-22 LAB — BASIC METABOLIC PANEL
Anion gap: 14 (ref 5–15)
BUN: 48 mg/dL — ABNORMAL HIGH (ref 6–20)
CO2: 29 mmol/L (ref 22–32)
Calcium: 8.6 mg/dL — ABNORMAL LOW (ref 8.9–10.3)
Chloride: 90 mmol/L — ABNORMAL LOW (ref 101–111)
Creatinine, Ser: 10.68 mg/dL — ABNORMAL HIGH (ref 0.61–1.24)
GFR calc Af Amer: 5 mL/min — ABNORMAL LOW (ref >60)
GFR calc non Af Amer: 4 mL/min — ABNORMAL LOW (ref >60)
Glucose, Bld: 263 mg/dL — ABNORMAL HIGH (ref 65–99)
Potassium: 4.2 mmol/L (ref 3.5–5.1)
Sodium: 133 mmol/L — ABNORMAL LOW (ref 135–145)

## 2015-09-22 LAB — GLUCOSE, CAPILLARY
GLUCOSE-CAPILLARY: 138 mg/dL — AB (ref 65–99)
GLUCOSE-CAPILLARY: 190 mg/dL — AB (ref 65–99)

## 2015-09-22 LAB — I-STAT TROPONIN, ED: TROPONIN I, POC: 0.05 ng/mL (ref 0.00–0.08)

## 2015-09-22 LAB — BRAIN NATRIURETIC PEPTIDE: B Natriuretic Peptide: 568.9 pg/mL — ABNORMAL HIGH (ref 0.0–100.0)

## 2015-09-22 LAB — MRSA PCR SCREENING: MRSA by PCR: NEGATIVE

## 2015-09-22 MED ORDER — ONDANSETRON HCL 4 MG/2ML IJ SOLN: 4.0000 mg | Freq: Four times a day (QID) | INTRAMUSCULAR | Status: DC | PRN

## 2015-09-22 MED ORDER — ALTEPLASE 2 MG IJ SOLR: 2.0000 mg | Freq: Once | INTRAMUSCULAR | Status: DC | PRN

## 2015-09-22 MED ORDER — METOPROLOL TARTRATE 50 MG PO TABS
50.0000 mg | ORAL_TABLET | Freq: Two times a day (BID) | ORAL | Status: DC
  Administered 2015-09-23 (×2): 50 mg via ORAL
  Filled 2015-09-22 (×2): qty 1

## 2015-09-22 MED ORDER — DOXERCALCIFEROL 4 MCG/2ML IV SOLN
3.0000 ug | INTRAVENOUS | Status: DC
  Administered 2015-09-22 – 2015-09-24 (×2): 3 ug via INTRAVENOUS
  Filled 2015-09-22 (×2): qty 2

## 2015-09-22 MED ORDER — SODIUM CHLORIDE 0.9 % IV SOLN: INTRAVENOUS | Status: DC

## 2015-09-22 MED ORDER — SODIUM CHLORIDE 0.9 % IV SOLN
Freq: Once | INTRAVENOUS | Status: AC
  Administered 2015-09-22: 09:00:00 via INTRAVENOUS

## 2015-09-22 MED ORDER — DARBEPOETIN ALFA 25 MCG/0.42ML IJ SOSY
PREFILLED_SYRINGE | INTRAMUSCULAR | Status: AC
  Administered 2015-09-22: 25 ug via INTRAVENOUS
  Filled 2015-09-22: qty 0.42

## 2015-09-22 MED ORDER — WARFARIN - PHARMACIST DOSING INPATIENT
Freq: Every day | Status: DC
  Administered 2015-09-22: 21:00:00

## 2015-09-22 MED ORDER — DORZOLAMIDE HCL-TIMOLOL MAL 2-0.5 % OP SOLN
1.0000 [drp] | Freq: Two times a day (BID) | OPHTHALMIC | Status: DC
Start: 2015-09-22 — End: 2015-09-24
  Administered 2015-09-22 – 2015-09-24 (×4): 1 [drp] via OPHTHALMIC
  Filled 2015-09-22: qty 10

## 2015-09-22 MED ORDER — CALCIUM ACETATE (PHOS BINDER) 667 MG PO CAPS
1334.0000 mg | ORAL_CAPSULE | Freq: Three times a day (TID) | ORAL | Status: DC
  Administered 2015-09-22 – 2015-09-24 (×5): 1334 mg via ORAL
  Filled 2015-09-22 (×5): qty 2

## 2015-09-22 MED ORDER — PENTAFLUOROPROP-TETRAFLUOROETH EX AERO: 1.0000 "application " | INHALATION_SPRAY | CUTANEOUS | Status: DC | PRN

## 2015-09-22 MED ORDER — LIDOCAINE-PRILOCAINE 2.5-2.5 % EX CREA: 1.0000 "application " | TOPICAL_CREAM | CUTANEOUS | Status: DC | PRN

## 2015-09-22 MED ORDER — WARFARIN SODIUM 7.5 MG PO TABS
7.5000 mg | ORAL_TABLET | Freq: Every day | ORAL | Status: DC
  Administered 2015-09-22 – 2015-09-23 (×2): 7.5 mg via ORAL
  Filled 2015-09-22 (×2): qty 1

## 2015-09-22 MED ORDER — AMLODIPINE BESYLATE 5 MG PO TABS: 10.0000 mg | ORAL_TABLET | Freq: Every day | ORAL | Status: DC

## 2015-09-22 MED ORDER — ACETAMINOPHEN 325 MG PO TABS: 650.0000 mg | ORAL_TABLET | Freq: Four times a day (QID) | ORAL | Status: DC | PRN

## 2015-09-22 MED ORDER — SODIUM CHLORIDE 0.9 % IV SOLN: 100.0000 mL | INTRAVENOUS | Status: DC | PRN

## 2015-09-22 MED ORDER — INSULIN ASPART 100 UNIT/ML ~~LOC~~ SOLN
0.0000 [IU] | Freq: Three times a day (TID) | SUBCUTANEOUS | Status: DC
  Administered 2015-09-22: 1 [IU] via SUBCUTANEOUS
  Administered 2015-09-23: 2 [IU] via SUBCUTANEOUS
  Administered 2015-09-23: 3 [IU] via SUBCUTANEOUS

## 2015-09-22 MED ORDER — ACETAMINOPHEN 650 MG RE SUPP: 650.0000 mg | Freq: Four times a day (QID) | RECTAL | Status: DC | PRN

## 2015-09-22 MED ORDER — DILTIAZEM HCL ER COATED BEADS 180 MG PO CP24
360.0000 mg | ORAL_CAPSULE | Freq: Every day | ORAL | Status: DC
  Administered 2015-09-22 – 2015-09-24 (×3): 360 mg via ORAL
  Filled 2015-09-22 (×4): qty 2

## 2015-09-22 MED ORDER — PREDNISOLONE ACETATE 1 % OP SUSP
1.0000 [drp] | Freq: Two times a day (BID) | OPHTHALMIC | Status: DC
  Administered 2015-09-22 – 2015-09-24 (×4): 1 [drp] via OPHTHALMIC
  Filled 2015-09-22: qty 1

## 2015-09-22 MED ORDER — CINACALCET HCL 30 MG PO TABS
60.0000 mg | ORAL_TABLET | Freq: Every day | ORAL | Status: DC
  Administered 2015-09-22 – 2015-09-23 (×2): 60 mg via ORAL
  Filled 2015-09-22 (×2): qty 2

## 2015-09-22 MED ORDER — ATORVASTATIN CALCIUM 10 MG PO TABS
10.0000 mg | ORAL_TABLET | Freq: Every day | ORAL | Status: DC
  Administered 2015-09-22 – 2015-09-23 (×2): 10 mg via ORAL
  Filled 2015-09-22 (×2): qty 1

## 2015-09-22 MED ORDER — HEPARIN SODIUM (PORCINE) 1000 UNIT/ML DIALYSIS: 4200.0000 [IU] | Freq: Once | INTRAMUSCULAR | Status: DC

## 2015-09-22 MED ORDER — ALBUTEROL SULFATE (2.5 MG/3ML) 0.083% IN NEBU
5.0000 mg | INHALATION_SOLUTION | Freq: Once | RESPIRATORY_TRACT | Status: AC
  Administered 2015-09-22: 5 mg via RESPIRATORY_TRACT
  Filled 2015-09-22: qty 6

## 2015-09-22 MED ORDER — TOBRAMYCIN-DEXAMETHASONE 0.3-0.1 % OP SUSP
1.0000 [drp] | Freq: Four times a day (QID) | OPHTHALMIC | Status: DC
  Administered 2015-09-22 – 2015-09-24 (×7): 1 [drp] via OPHTHALMIC
  Filled 2015-09-22: qty 2.5

## 2015-09-22 MED ORDER — CALCIUM ACETATE (PHOS BINDER) 667 MG PO CAPS: 1334.0000 mg | ORAL_CAPSULE | Freq: Three times a day (TID) | ORAL | Status: DC

## 2015-09-22 MED ORDER — HEPARIN SODIUM (PORCINE) 1000 UNIT/ML DIALYSIS: 1000.0000 [IU] | INTRAMUSCULAR | Status: DC | PRN

## 2015-09-22 MED ORDER — LIDOCAINE HCL (PF) 1 % IJ SOLN: 5.0000 mL | INTRAMUSCULAR | Status: DC | PRN

## 2015-09-22 MED ORDER — BISACODYL 10 MG RE SUPP: 10.0000 mg | Freq: Every day | RECTAL | Status: DC | PRN

## 2015-09-22 MED ORDER — B COMPLEX-C PO TABS
1.0000 | ORAL_TABLET | Freq: Every day | ORAL | Status: DC
  Administered 2015-09-22 – 2015-09-24 (×3): 1 via ORAL
  Filled 2015-09-22 (×3): qty 1

## 2015-09-22 MED ORDER — SENNOSIDES-DOCUSATE SODIUM 8.6-50 MG PO TABS
1.0000 | ORAL_TABLET | Freq: Every evening | ORAL | Status: DC | PRN
  Administered 2015-09-23: 1 via ORAL
  Filled 2015-09-22: qty 1

## 2015-09-22 MED ORDER — HYDROCODONE-ACETAMINOPHEN 5-325 MG PO TABS
1.0000 | ORAL_TABLET | ORAL | Status: DC | PRN
  Administered 2015-09-22: 2 via ORAL

## 2015-09-22 MED ORDER — MAGNESIUM CITRATE PO SOLN: 1.0000 | Freq: Once | ORAL | Status: DC | PRN

## 2015-09-22 MED ORDER — SODIUM CHLORIDE 0.9% FLUSH
3.0000 mL | Freq: Two times a day (BID) | INTRAVENOUS | Status: DC
  Administered 2015-09-22 – 2015-09-23 (×3): 3 mL via INTRAVENOUS

## 2015-09-22 MED ORDER — SODIUM CHLORIDE 0.9 % IV SOLN
62.5000 mg | INTRAVENOUS | Status: DC
  Administered 2015-09-22: 62.5 mg via INTRAVENOUS
  Filled 2015-09-22: qty 5

## 2015-09-22 MED ORDER — ATROPINE SULFATE 1 % OP SOLN
1.0000 [drp] | Freq: Two times a day (BID) | OPHTHALMIC | Status: DC
  Administered 2015-09-22 – 2015-09-24 (×4): 1 [drp] via OPHTHALMIC
  Filled 2015-09-22: qty 2

## 2015-09-22 MED ORDER — SODIUM CHLORIDE 0.9 % IV BOLUS (SEPSIS): 1000.0000 mL | Freq: Once | INTRAVENOUS | Status: DC

## 2015-09-22 MED ORDER — ONDANSETRON HCL 4 MG PO TABS: 4.0000 mg | ORAL_TABLET | Freq: Four times a day (QID) | ORAL | Status: DC | PRN

## 2015-09-22 MED ORDER — DARBEPOETIN ALFA 25 MCG/0.42ML IJ SOSY
25.0000 ug | PREFILLED_SYRINGE | INTRAMUSCULAR | Status: DC
  Administered 2015-09-22: 25 ug via INTRAVENOUS
  Filled 2015-09-22: qty 0.42

## 2015-09-22 MED ORDER — DOXERCALCIFEROL 4 MCG/2ML IV SOLN
INTRAVENOUS | Status: AC
  Administered 2015-09-22: 3 ug via INTRAVENOUS
  Filled 2015-09-22: qty 2

## 2015-09-22 MED ORDER — TRAZODONE HCL 50 MG PO TABS: 25.0000 mg | ORAL_TABLET | Freq: Every evening | ORAL | Status: DC | PRN

## 2015-09-22 MED ORDER — LATANOPROST 0.005 % OP SOLN
1.0000 [drp] | Freq: Every day | OPHTHALMIC | Status: DC
  Administered 2015-09-22 – 2015-09-23 (×2): 1 [drp] via OPHTHALMIC
  Filled 2015-09-22: qty 2.5

## 2015-09-22 MED ORDER — HYDROCODONE-ACETAMINOPHEN 5-325 MG PO TABS
ORAL_TABLET | ORAL | Status: AC
  Administered 2015-09-22: 2 via ORAL
  Filled 2015-09-22: qty 2

## 2015-09-22 NOTE — Progress Notes (Signed)
Patient arrived to unit per ED stretcher.  Reviewed treatment plan and this RN agrees.  Report received from bedside RN, Lynnze.  Consent obtained.  Patient A & O X 4. Lung sounds clear, diminished to ausculation in all fields. Generalized edema. Cardiac: NSR.  Prepped LUAVF with alcohol and cannulated with two 15 gauge needles.  Pulsation of blood noted.  Flushed access well with saline per protocol.  Connected and secured lines and initiated tx at 1141.  UF goal of 4500 mL and net fluid removal of 4000 mL.  Will continue to monitor.

## 2015-09-22 NOTE — Procedures (Signed)
  I was present at this dialysis session, have reviewed the session itself and made  appropriate changes Vinson Moselleob Lani Mendiola MD [facility] pager [phone]    cell [phone] 09/22/2015, 3:28 PM

## 2015-09-22 NOTE — Care Management Note (Signed)
Case Management Note  Patient Details  Name: [name] [name] MRN: [id number] Date of Birth: Sep 29, 1933  Subjective/Objective:        CM following for progression and d/c planning.             Action/Plan: 09/22/2015 Noted consult for CHF program, await pt response to treatment and MD determination re CHF program .   Expected Discharge Date:                  Expected Discharge Plan:   (To be determined)  In-House Referral:  Clinical Social Work  Discharge planning Services  CM Consult  Post Acute Care Choice:    Choice offered to:     DME Arranged:    DME Agency:     HH Arranged:    HH Agency:     Status of Service:  In process, will continue to follow  If discussed at Long Length of Stay Meetings, dates discussed:    Additional Comments:  [name], [name], RN 09/22/2015, 4:15 PM

## 2015-09-22 NOTE — ED Provider Notes (Signed)
[facility]-EMERGENCY DEPT Provider Note   CSN: [id number] Arrival date & time: 09/22/15  0802     History   Chief Complaint Chief Complaint  Patient presents with  . Weakness  . Shortness of Breath    HPI [name] [name] is a 82 y.o. male.  HPI   83 year old male presents today with complaints of weakness and shortness of breath. Patient has a significant past medical history of anemia, atrial flutter, bradycardia, diabetes, end-stage renal disease Monday Wednesday Friday dialysis patient. Patient is a poor historian but notes that over the last week he has had generalized weakness, with if continue ambulating due to the weakness. He also notes that he has had a productive cough, and has been short of breath. Patient reports that he is a nonsmoker not on home O2. EMS notes that he was hypoxic at 87 and was placed on 2 L. Patient denies any fever at home, he denies any chest pain or abdominal pain, he denies any lower extremity swelling or edema.  Most recently Hospitalized on 08/12/2015 through 08/15/2015 for A. fib with RVR. Patient had echo at that time showing Normal LV size with mild LV hypertrophy. EF 60-65%. Normal RVsize and systolic function. Mild aortic stenosis. Moderate biatrial enlargement. Patient's primary cardiologist was Dr. Ladona Ridgel and EP Dr. Katrinka Blazing   Past Medical History:  Diagnosis Date  . Anemia   . Atrial flutter (HCC)   . Bradycardia    a. unable to tolerate beta blockers in the past.  . Diabetes mellitus (HCC)   . ESRD (end stage renal disease) on dialysis [facility])    "MWF; [address]" (04/08/2015)  . Glaucoma   . Gout   . Hypertension   . PAF (paroxysmal atrial fibrillation) (HCC)   . Peripheral neuropathy (HCC)   . PSVT (paroxysmal supraventricular tachycardia) (HCC)    a. h/o ablation.    Patient Active Problem List   Diagnosis Date Noted  . PAF (paroxysmal atrial fibrillation) (HCC) 04/20/2015  . Encephalopathy 04/20/2015  . Physical  deconditioning 04/20/2015  . Diabetes mellitus with complication (HCC) 04/20/2015  . Acute respiratory failure with hypoxia (HCC)   . Atrial flutter with rapid ventricular response (HCC)   . Acute respiratory failure (HCC) 04/12/2015  . HCAP (healthcare-associated pneumonia) 04/12/2015  . Elevated troponin 04/12/2015  . Anemia 04/12/2015  . Bradycardia 11/19/2014  . Encounter for therapeutic drug monitoring 06/16/2014  . Sinus bradycardia-tachycardia syndrome (HCC) 01/11/2012    Class: Chronic  . Atrial flutter (HCC) 01/10/2012  . Blind left eye 01/10/2012  . ESRD (end stage renal disease) (HCC) 01/10/2012  . Atrial fibrillation with RVR (HCC) 01/10/2012  . Hypertension   . Peripheral neuropathy (HCC)   . Diabetes mellitus with ESRD (end-stage renal disease) (HCC)     Past Surgical History:  Procedure Laterality Date  . ABLATION OF DYSRHYTHMIC FOCUS    . SP AV DIALYSIS SHUNT ACCESS EXISTING *L*       Home Medications    Prior to Admission medications   Medication Sig Start Date End Date Taking? Authorizing Provider  acetaminophen (TYLENOL) 325 MG tablet Take 650 mg by mouth daily as needed for mild pain.   Yes Historical Provider, MD  amLODipine (NORVASC) 10 MG tablet Take 10 mg by mouth daily.   Yes Historical Provider, MD  atorvastatin (LIPITOR) 10 MG tablet Take 1 tablet (10 mg total) by mouth daily at 6 PM. 08/15/15  Yes Leone Brand, NP  atropine 1 % ophthalmic solution  Place 1 drop into the left eye 2 times daily at 12 noon and 4 pm.   Yes Historical Provider, MD  Calcium Acetate 667 MG TABS Take 1-2 capsules by mouth 3 (three) times daily. 667 mg for snacks, 1334 mg for meals   Yes Historical Provider, MD  diltiazem (CARDIZEM CD) 360 MG 24 hr capsule Take 1 capsule (360 mg total) by mouth daily. 08/31/15  Yes Rosalio MacadamiaLori C Gerhardt, NP  docusate sodium (COLACE) 100 MG capsule Take 100 mg by mouth 2 (two) times daily.   Yes Historical Provider, MD  dorzolamide-timolol (COSOPT)  22.3-6.8 MG/ML ophthalmic solution Place 1 drop into the right eye 2 (two) times daily.    Yes Historical Provider, MD  folic acid-vitamin b complex-vitamin c-selenium-zinc (DIALYVITE) 3 MG TABS tablet Take 1 tablet by mouth daily.   Yes Historical Provider, MD  lidocaine-prilocaine (EMLA) cream Apply 1 application topically as needed (apply to skin near dialysis port before dialysis).  08/31/14  Yes Historical Provider, MD  metoprolol (LOPRESSOR) 50 MG tablet Take 1 tablet (50 mg total) by mouth 2 (two) times daily. 08/15/15  Yes Leone BrandLaura R Ingold, NP  Multiple Vitamin (MULTIVITAMIN WITH MINERALS) TABS tablet Take 1 tablet by mouth daily. 04/17/15  Yes Belkys A Regalado, MD  prednisoLONE acetate (PRED FORTE) 1 % ophthalmic suspension Place 1 drop into the left eye 2 (two) times daily. Patient to use for 37 days. Filled on 03-28-15   Yes Historical Provider, MD  SENSIPAR 60 MG tablet Take 60 mg by mouth at bedtime.  09/03/14  Yes Historical Provider, MD  tobramycin-dexamethasone Wallene Dales(TOBRADEX) ophthalmic solution Place 1 drop into the left eye 4 (four) times daily. For 37 days. Filled 03-31-15   Yes Historical Provider, MD  TRAVATAN Z 0.004 % SOLN ophthalmic solution Place 1 drop into the right eye at bedtime.  08/30/14  Yes Historical Provider, MD  warfarin (COUMADIN) 5 MG tablet Take as directed by [facility] Patient taking differently: Take 7.5 mg by mouth See admin instructions. 7.5 mg daily 07/09/15  Yes Lyn RecordsHenry W Smith, MD    Family History Family History  Problem Relation Age of Onset  . Other Mother     bowel obstruction  . Heart failure Father     fluid bluid up  . Alcoholism Brother   . Alcoholism Brother     Social History Social History  Substance Use Topics  . Smoking status: Never Smoker  . Smokeless tobacco: Never Used  . Alcohol use No     Allergies   Diltiazem   Review of Systems Review of Systems  All other systems reviewed and are negative.  Physical Exam Updated Vital  Signs BP 148/84   Pulse (!) 59   Temp 98.6 F (37 C) (Oral)   Resp 19   Ht 6\' 4"  (1.93 m)   Wt 102.1 kg   SpO2 98%   BMI 27.39 kg/m   Physical Exam  Constitutional: He is oriented to person, place, and time. He appears well-developed and well-nourished.  HENT:  Head: Normocephalic and atraumatic.  Eyes: Conjunctivae are normal. Pupils are equal, round, and reactive to light. Right eye exhibits no discharge. Left eye exhibits no discharge. No scleral icterus.  Neck: Normal range of motion. No JVD present. No tracheal deviation present.  Cardiovascular: Normal rate and regular rhythm.   Pulmonary/Chest: Effort normal. No stridor. He has wheezes.  Difficult exam due to patient effort  Abdominal: Soft.  Musculoskeletal:  No lower extremity swelling or  edema  Neurological: He is alert and oriented to person, place, and time. Coordination normal.  Psychiatric: He has a normal mood and affect. His behavior is normal. Judgment and thought content normal.  Nursing note and vitals reviewed.   ED Treatments / Results  Labs (all labs ordered are listed, but only abnormal results are displayed) Labs Reviewed  CBC WITH DIFFERENTIAL/PLATELET - Abnormal; Notable for the following:       Result Value   RBC 3.70 (*)    Hemoglobin 11.0 (*)    HCT 34.0 (*)    RDW 16.7 (*)    All other components within normal limits  BASIC METABOLIC PANEL - Abnormal; Notable for the following:    Sodium 133 (*)    Chloride 90 (*)    Glucose, Bld 263 (*)    BUN 48 (*)    Creatinine, Ser 10.68 (*)    Calcium 8.6 (*)    GFR calc non Af Amer 4 (*)    GFR calc Af Amer 5 (*)    All other components within normal limits  BRAIN NATRIURETIC PEPTIDE - Abnormal; Notable for the following:    B Natriuretic Peptide 568.9 (*)    All other components within normal limits  I-STAT TROPOININ, ED    EKG  EKG Interpretation  Date/Time:  Wednesday September 22 2015 08:10:39 EDT Ventricular Rate:  72 PR Interval:      QRS Duration: 104 QT Interval:  442 QTC Calculation: 484 R Axis:   53 Text Interpretation:  Sinus rhythm First degree AVB Minimal ST depression, lateral leads Borderline prolonged QT interval Baseline wander in lead(s) V5 Confirmed by Fayrene Fearing  MD, MARK ([id number]) on 09/22/2015 8:34:10 AM       Radiology Dg Chest 2 View  Result Date: 09/22/2015 CLINICAL DATA:  Chronic renal failure with weakness. History anemia and cardiac arrhythmia EXAM: CHEST  2 VIEW COMPARISON:  August 12, 2015 FINDINGS: There is moderate generalized interstitial edema. There is a small right pleural effusion. There is cardiomegaly with pulmonary venous hypertension. No adenopathy. No appreciable airspace consolidation. IMPRESSION: Evidence of a degree of congestive heart failure without airspace consolidation. Electronically Signed   By: Bretta Bang III M.D.   On: 09/22/2015 08:54    Procedures Procedures (including critical care time)  Medications Ordered in ED Medications  albuterol (PROVENTIL) (2.5 MG/3ML) 0.083% nebulizer solution 5 mg (5 mg Nebulization Given 09/22/15 0830)  0.9 %  sodium chloride infusion ( Intravenous Stopped 09/22/15 1045)    Initial Impression / Assessment and Plan / ED Course  I have reviewed the triage vital signs and the nursing notes.  Pertinent labs & imaging results that were available during my care of the patient were reviewed by me and considered in my medical decision making (see chart for details).  Clinical Course    Final Clinical Impressions(s) / ED Diagnoses   Final diagnoses:  Congestive heart failure, unspecified congestive heart failure chronicity, unspecified congestive heart failure type (HCC)   Labs: CBC, BMP, BNP,   Imaging: DG chest 2 view  Consults:  Therapeutics:  Discharge Meds:   Assessment/Plan: 82 year old male presents today hypoxemia. Patient requiring nasal cannula here in the ED to maintain oxygen saturation, he appears to be in no acute  respiratory distress. Patient is a dialysis patient Monday Wednesday Friday, some interstitial edema with the degree of heart failure on chest x-ray. Patient will require dialysis, and likely hospital admission for continued respiratory support. Patient has no signs of focal  bacterial infection on chest x-ray, no recent change in medications, by mouth intake, and no lower extremity edema. Nephrology consult for dialysis, hospitalist consult for admission.  Chads Vasc score of 5  New Prescriptions New Prescriptions   No medications on file     Eyvonne MechanicJeffrey Waynesha Rammel, PA-C 09/22/15 1141    Rolland PorterMark James, MD 09/28/15 1655

## 2015-09-22 NOTE — ED Notes (Signed)
Pt given turkey sandwich

## 2015-09-22 NOTE — Consult Note (Signed)
[facility] Renal Consultation Note    Indication for Consultation:  Management of ESRD/hemodialysis; anemia, hypertension/volume and secondary hyperparathyroidism PCP:  HPI: [name] [name] is a [age] y.o. male with ESRD (MWF [facility]), hx DM, HTN, afib, glaucoma, blind in one eye who presented to the ED with SOB since last night. He is nonambulatory. He has coughing at times. He denies SOB.   Goals are high at times at outpatient unit.  Goal on Monday was 3 L but only had a net UF of 1.3 leaving at 103.2 (EDW 101.5) Pre HD standing weight today is 105.1.  He cannot recall why he didn't get to his EDW which on most days he is able to attain. He has no N, V, D.  Makes a small amount of urine. He lives with his wife and manages at home.  Last Echo was in July with EF 60 - 65%. EKG shows not acute changes and trop is 0.05. He does not smoke.  Past Medical History:  Diagnosis Date  . Anemia   . Atrial flutter (HCC)   . Bradycardia    a. unable to tolerate beta blockers in the past.  . Diabetes mellitus (HCC)   . ESRD (end stage renal disease) on dialysis [facility](HCC)    "MWF; [address]" (04/08/2015)  . Glaucoma   . Gout   . Hypertension   . PAF (paroxysmal atrial fibrillation) (HCC)   . Peripheral neuropathy (HCC)   . PSVT (paroxysmal supraventricular tachycardia) (HCC)    a. h/o ablation.   Past Surgical History:  Procedure Laterality Date  . ABLATION OF DYSRHYTHMIC FOCUS    . SP AV DIALYSIS SHUNT ACCESS EXISTING *L*     Family History  Problem Relation Age of Onset  . Other Mother     bowel obstruction  . Heart failure Father     fluid bluid up  . Alcoholism Brother   . Alcoholism Brother    Social History:  reports that he has never smoked. He has never used smokeless tobacco. He reports that he does not drink alcohol or use drugs. Allergies  Allergen Reactions  . Diltiazem Other (See Comments)    SEVERE STOMACH CRAMPING PER PT   Prior to Admission medications    Medication Sig Start Date End Date Taking? Authorizing Provider  acetaminophen (TYLENOL) 325 MG tablet Take 650 mg by mouth daily as needed for mild pain.   Yes Historical Provider, MD  amLODipine (NORVASC) 10 MG tablet Take 10 mg by mouth daily.   Yes Historical Provider, MD  atorvastatin (LIPITOR) 10 MG tablet Take 1 tablet (10 mg total) by mouth daily at 6 PM. 08/15/15  Yes Leone BrandLaura R Ingold, NP  atropine 1 % ophthalmic solution Place 1 drop into the left eye 2 times daily at 12 noon and 4 pm.   Yes Historical Provider, MD  Calcium Acetate 667 MG TABS Take 1-2 capsules by mouth 3 (three) times daily. 667 mg for snacks, 1334 mg for meals   Yes Historical Provider, MD  diltiazem (CARDIZEM CD) 360 MG 24 hr capsule Take 1 capsule (360 mg total) by mouth daily. 08/31/15  Yes Rosalio MacadamiaLori C Gerhardt, NP  docusate sodium (COLACE) 100 MG capsule Take 100 mg by mouth 2 (two) times daily.   Yes Historical Provider, MD  dorzolamide-timolol (COSOPT) 22.3-6.8 MG/ML ophthalmic solution Place 1 drop into the right eye 2 (two) times daily.    Yes Historical Provider, MD  folic acid-vitamin b complex-vitamin c-selenium-zinc (  DIALYVITE) 3 MG TABS tablet Take 1 tablet by mouth daily.   Yes Historical Provider, MD  lidocaine-prilocaine (EMLA) cream Apply 1 application topically as needed (apply to skin near dialysis port before dialysis).  08/31/14  Yes Historical Provider, MD  metoprolol (LOPRESSOR) 50 MG tablet Take 1 tablet (50 mg total) by mouth 2 (two) times daily. 08/15/15  Yes Leone Brand, NP  Multiple Vitamin (MULTIVITAMIN WITH MINERALS) TABS tablet Take 1 tablet by mouth daily. 04/17/15  Yes Belkys A Regalado, MD  prednisoLONE acetate (PRED FORTE) 1 % ophthalmic suspension Place 1 drop into the left eye 2 (two) times daily. Patient to use for 37 days. Filled on 03-28-15   Yes Historical Provider, MD  SENSIPAR 60 MG tablet Take 60 mg by mouth at bedtime.  09/03/14  Yes Historical Provider, MD  tobramycin-dexamethasone  Wallene Dales) ophthalmic solution Place 1 drop into the left eye 4 (four) times daily. For 37 days. Filled 03-31-15   Yes Historical Provider, MD  TRAVATAN Z 0.004 % SOLN ophthalmic solution Place 1 drop into the right eye at bedtime.  08/30/14  Yes Historical Provider, MD  warfarin (COUMADIN) 5 MG tablet Take as directed by [facility] Patient taking differently: Take 7.5 mg by mouth See admin instructions. 7.5 mg daily 07/09/15  Yes Lyn Records, MD   Current Facility-Administered Medications  Medication Dose Route Frequency Provider Last Rate Last Dose  . 0.9 %  sodium chloride infusion   Intravenous Continuous Marcos Eke, PA-C      . acetaminophen (TYLENOL) tablet 650 mg  650 mg Oral Q6H PRN Marcos Eke, PA-C       Or  . acetaminophen (TYLENOL) suppository 650 mg  650 mg Rectal Q6H PRN Marcos Eke, PA-C      . atorvastatin (LIPITOR) tablet 10 mg  10 mg Oral q1800 Marcos Eke, PA-C      . atropine 1 % ophthalmic solution 1 drop  1 drop Left Eye q12n4p Sung Amabile Wertman, PA-C      . bisacodyl (DULCOLAX) suppository 10 mg  10 mg Rectal Daily PRN Marcos Eke, PA-C      . Calcium Acetate TABS 1-2 capsule  1-2 capsule Oral TID Marcos Eke, PA-C      . cinacalcet [facility]) tablet 60 mg  60 mg Oral QHS Marcos Eke, PA-C      . Darbepoetin Alfa (ARANESP) injection 25 mcg  25 mcg Intravenous Q Wed-HD Weston Settle, PA-C   25 mcg at 09/22/15 1145  . diltiazem (CARDIZEM CD) 24 hr capsule 360 mg  360 mg Oral Daily Marcos Eke, PA-C      . dorzolamide-timolol (COSOPT) 22.3-6.8 MG/ML ophthalmic solution 1 drop  1 drop Right Eye BID Marcos Eke, PA-C      . doxercalciferol (HECTOROL) injection 3 mcg  3 mcg Intravenous Q M,W,F-HD Weston Settle, PA-C   3 mcg at 09/22/15 1145  . ferric gluconate (NULECIT) 62.5 mg in sodium chloride 0.9 % 100 mL IVPB  62.5 mg Intravenous Q Wed-HD Weston Settle, PA-C      . folic acid-vitamin b complex-vitamin c-selenium-zinc (DIALYVITE) tablet 1 tablet   1 tablet Oral Daily Marcos Eke, PA-C      . HYDROcodone-acetaminophen (NORCO/VICODIN) 5-325 MG per tablet 1-2 tablet  1-2 tablet Oral Q4H PRN Marcos Eke, PA-C   2 tablet at 09/22/15 1145  . insulin aspart (novoLOG) injection 0-9 Units  0-9 Units Subcutaneous TID WC Huntley Dec  E Wertman, PA-C      . latanoprost (XALATAN) 0.005 % ophthalmic solution 1 drop  1 drop Right Eye QHS Sung Amabile Wertman, PA-C      . magnesium citrate solution 1 Bottle  1 Bottle Oral Once PRN Marcos Eke, PA-C      . Melene Muller ON 09/23/2015] metoprolol tartrate (LOPRESSOR) tablet 50 mg  50 mg Oral BID Marcos Eke, PA-C      . ondansetron [facility]) tablet 4 mg  4 mg Oral Q6H PRN Marcos Eke, PA-C       Or  . ondansetron (ZOFRAN) injection 4 mg  4 mg Intravenous Q6H PRN Marcos Eke, PA-C      . prednisoLONE acetate (PRED FORTE) 1 % ophthalmic suspension 1 drop  1 drop Left Eye BID Marcos Eke, PA-C      . senna-docusate (Senokot-S) tablet 1 tablet  1 tablet Oral QHS PRN Marcos Eke, PA-C      . sodium chloride flush (NS) 0.9 % injection 3 mL  3 mL Intravenous Q12H Marcos Eke, PA-C      . tobramycin-dexamethasone [facility]) ophthalmic suspension 1 drop  1 drop Left Eye QID Marcos Eke, PA-C      . traZODone (DESYREL) tablet 25 mg  25 mg Oral QHS PRN Marcos Eke, PA-C       Current Outpatient Prescriptions  Medication Sig Dispense Refill  . acetaminophen (TYLENOL) 325 MG tablet Take 650 mg by mouth daily as needed for mild pain.    Marland Kitchen amLODipine (NORVASC) 10 MG tablet Take 10 mg by mouth daily.    Marland Kitchen atorvastatin (LIPITOR) 10 MG tablet Take 1 tablet (10 mg total) by mouth daily at 6 PM. 30 tablet 6  . atropine 1 % ophthalmic solution Place 1 drop into the left eye 2 times daily at 12 noon and 4 pm.    . Calcium Acetate 667 MG TABS Take 1-2 capsules by mouth 3 (three) times daily. 667 mg for snacks, 1334 mg for meals    . diltiazem (CARDIZEM CD) 360 MG 24 hr capsule Take 1 capsule (360 mg total) by mouth  daily. 30 capsule 6  . docusate sodium (COLACE) 100 MG capsule Take 100 mg by mouth 2 (two) times daily.    . dorzolamide-timolol (COSOPT) 22.3-6.8 MG/ML ophthalmic solution Place 1 drop into the right eye 2 (two) times daily.     . folic acid-vitamin b complex-vitamin c-selenium-zinc (DIALYVITE) 3 MG TABS tablet Take 1 tablet by mouth daily.    Marland Kitchen lidocaine-prilocaine (EMLA) cream Apply 1 application topically as needed (apply to skin near dialysis port before dialysis).     . metoprolol (LOPRESSOR) 50 MG tablet Take 1 tablet (50 mg total) by mouth 2 (two) times daily. 60 tablet 6  . Multiple Vitamin (MULTIVITAMIN WITH MINERALS) TABS tablet Take 1 tablet by mouth daily. 30 tablet 0  . prednisoLONE acetate (PRED FORTE) 1 % ophthalmic suspension Place 1 drop into the left eye 2 (two) times daily. Patient to use for 37 days. Filled on 03-28-15    . SENSIPAR 60 MG tablet Take 60 mg by mouth at bedtime.     Marland Kitchen tobramycin-dexamethasone (TOBRADEX) ophthalmic solution Place 1 drop into the left eye 4 (four) times daily. For 37 days. Filled 03-31-15    . TRAVATAN Z 0.004 % SOLN ophthalmic solution Place 1 drop into the right eye at bedtime.     Marland Kitchen warfarin (COUMADIN) 5 MG tablet Take  as directed by [facility] (Patient taking differently: Take 7.5 mg by mouth See admin instructions. 7.5 mg daily) 50 tablet 3   Labs: Basic Metabolic Panel:  Recent Labs Lab 09/22/15 0833  NA 133*  K 4.2  CL 90*  CO2 29  GLUCOSE 263*  BUN 48*  CREATININE 10.68*  CALCIUM 8.6*   CBC:  Recent Labs Lab 09/22/15 0833  WBC 8.5  NEUTROABS 6.8  HGB 11.0*  HCT 34.0*  MCV 91.9  PLT 177   Lab Results  Component Value Date   INR 4.0 09/16/2015   INR 3.3 08/31/2015   INR 1.5 08/24/2015     Studies/Results: Dg Chest 2 View  Result Date: 09/22/2015 CLINICAL DATA:  Chronic renal failure with weakness. History anemia and cardiac arrhythmia EXAM: CHEST  2 VIEW COMPARISON:  August 12, 2015 FINDINGS: There is  moderate generalized interstitial edema. There is a small right pleural effusion. There is cardiomegaly with pulmonary venous hypertension. No adenopathy. No appreciable airspace consolidation. IMPRESSION: Evidence of a degree of congestive heart failure without airspace consolidation. Electronically Signed   By: Bretta BangWilliam  Woodruff III M.D.   On: 09/22/2015 08:54    ROS: As per HPI otherwise negative.  Physical Exam: Vitals:   09/22/15 1115 09/22/15 1129 09/22/15 1141 09/22/15 1145  BP: 148/84  157/92 154/94  Pulse: (!) 59 71 68 68  Resp: 19 19    Temp:  98.6 F (37 C)    TempSrc:      SpO2: 98%     Weight:  105.1 kg (231 lb 11.3 oz)    Height:         General: elderly AAM eating sandwich on HD Head: NCAT left eye blind Neck: Supple.  Lungs: BS markedly diminished poor expansion Breathing is unlabored. Heart: RRR with S1 S2.  Abdomen: soft NT + BS no overt ascites Lower extremities:without edema or ischemic changes, no open wounds  Neuro: A & O  X 3. Moves all extremities spontaneously. Psych:  Responds to questions appropriately with a normal affect. Dialysis Access: left upper AVF Qb 400  Dialysis Orders:  [facility] MWF 4 hr EDW 101.5 400/A 1.5 heparin 4200 hectorol 3 venofer 50 per week, Mircera 75 q 2 weeks - last 8/9  Assessment/Plan: 1.  SOB secondary to interstitial edema - almost 4 kg above EDW-  2.  ESRD -  MWF K 4.2 pre wt 105.1 standing  3.  Hypertension/volume  - decrease volume with HD today.  May need to lower EDW slightly 4.  Anemia  - hgb stable at 11- due for redose today - have ordered lower Aranesp 25 dose 5.  Metabolic bone disease -  Continue hectorol 6.  Nutrition -renal diet/vit 7. Afib - INR elevated at 4 on MTP 8. DM - per primary  Sheffield SliderMartha B Bergman, PA-C [facility] Beeper [phone] 09/22/2015, 12:16 PM   Pt seen, examined and agree w A/P as above. ESRD pt with SOB and pulm edema on CXR, plan HD w maximum UF and lowering of dry wt if needed.   Vinson Moselleob Jelesa Mangini MD [facility] pager [phone]    cell [phone] 09/22/2015, 3:27 PM

## 2015-09-22 NOTE — Progress Notes (Signed)
Dialysis treatment completed.  4500 mL ultrafiltrated and net fluid removal 4000 mL.    Patient status unchanged. Lung sounds clear to ausculation in all fields. Generalized edema. Cardiac: NSR.  Disconnected lines and removed needles.  Pressure held for 10 minutes and band aid/gauze dressing applied.  Report given to bedside RN, Selena BattenKim.

## 2015-09-22 NOTE — ED Provider Notes (Signed)
Recent seen and evaluated. Discussed with Burna FortsJeff Hedges PA-C. Patient complains of difficulty sleeping. He was unable to lay flat and had continually sit up to breathe. Chest x-ray shows congestive heart failure. Does not have peripheral edema. He has been compliant with, not missed dialysis. Recent normal EF. Hypoxic at 86 on room air. I agree with admission for dialysis. Patient given albuterol neb which did offer some symptomatic relief. On exam has diffuse subtle crackles. Frequent cough. Afebrile.   Rolland PorterMark Akia Montalban, MD 09/22/15 [phone]

## 2015-09-22 NOTE — ED Triage Notes (Signed)
Per [facility]: Patient to ED from home c/o generalized weakness x 1 week. Hemodialysis patient MWF, supposed to go for dialysis today. A&O x 4. Per EMS, pt was 87% RA at home, not on O2 at home, EMS placed on 2L [place], increased to 100%.

## 2015-09-22 NOTE — ED Notes (Signed)
Admitting at bedside 

## 2015-09-22 NOTE — H&P (Signed)
History and Physical    [name] [name] QPR:[id number] DOB: [birth year] DOA: 09/22/2015   PCP: Kandice Hams, MD   Patient coming from:  Home  Chief Complaint: Shortness of Breath   HPI: [name] [name] is a 82 y.o. male with medical history significant for ESRD on hemodialysis Monday Wednesday and Friday, HTN diabetes, atrial fibrillation with RVR, with multiple admissions, last on 08/12/2015, previous history of PA F , s/p ablation for PSVT, presenting today to the emergency department with generalized weakness and 1 week history of progressive dyspnea, accompanied by productive frothy cough. He denies rhinorrhea or hemoptysis. Nonsmoker, and the patient is not on home oxygen. On EMS arrived, the patient was hypoxic, at 87%, placed on 2 L with some relief. He denies fevers, chills, chest pain or palpitations, nausea vomiting or abdominal pain. HEENT reports decreased appetite. He makesminimal urine. He denies any lower extremities swelling . He denies any recent infections. No sick contacts. Denies any recent distance travel.  He denies any syncope, presyncope or vertigo. No changes in his meds and he is compliant. LAst dialysis was on Monday   ED Course:  BP 148/84   Pulse (P) 71   Temp (P) 98.6 F (37 C)   Resp (P) 19   Ht _0  (1.93 m)   Wt 102.1 kg (225 lb)   SpO2 98%   BMI 27.39 kg/m   Last 2-D echo on July 2017 showed a mild LV hypertrophy, EF 99-35%, normal systolic function, and likely grade 1 diastolic dysfunction.  hemoglobin 11, sodium 133, glucose 263, BUN 48 creatinine 10.68, calcium 8.6, EGFR 5 BNP 568.9 Troponin 0.05   EKG SR without significant changes from prior  Review of Systems: As per HPI otherwise 10 point review of systems negative.   Past Medical History:  Diagnosis Date  . Anemia   . Atrial flutter ([place])   . Bradycardia    a. unable to tolerate beta blockers in the past.  . Diabetes mellitus ([place])   . ESRD (end stage renal disease) on dialysis [facility])    "MWF; [facility] [place]" (04/08/2015)  . Glaucoma   . Gout   . Hypertension   . PAF (paroxysmal atrial fibrillation) ([place])   . Peripheral neuropathy ([place])   . PSVT (paroxysmal supraventricular tachycardia) ([place])    a. h/o ablation.    Past Surgical History:  Procedure Laterality Date  . ABLATION OF DYSRHYTHMIC FOCUS    . SP AV DIALYSIS SHUNT ACCESS EXISTING *L*      Social History Social History   Social History  . Marital status: Married    Spouse name: N/A  . Number of children: N/A  . Years of education: N/A   Occupational History  . Not on file.   Social History Main Topics  . Smoking status: Never Smoker  . Smokeless tobacco: Never Used  . Alcohol use No  . Drug use: No  . Sexual activity: Not on file   Other Topics Concern  . Not on file   Social History Narrative  . No narrative on file     Allergies  Allergen Reactions  . Diltiazem Other (See Comments)    SEVERE STOMACH CRAMPING PER PT    Family History  Problem Relation Age of Onset  . Other Mother     bowel obstruction  . Heart failure Father     fluid bluid up  . Alcoholism Brother   . Alcoholism Brother  Prior to Admission medications   Medication Sig Start Date End Date Taking? Authorizing Provider  acetaminophen (TYLENOL) 325 MG tablet Take 650 mg by mouth daily as needed for mild pain.   Yes Historical Provider, MD  amLODipine (NORVASC) 10 MG tablet Take 10 mg by mouth daily.   Yes Historical Provider, MD  atorvastatin (LIPITOR) 10 MG tablet Take 1 tablet (10 mg total) by mouth daily at 6 PM. 08/15/15  Yes Isaiah Serge, NP  atropine 1 % ophthalmic solution Place 1 drop into the left eye 2 times daily at 12 noon and 4 pm.   Yes Historical Provider, MD  Calcium Acetate 667 MG TABS Take 1-2 capsules by mouth 3 (three) times daily. 667 mg for snacks, 1334 mg for meals   Yes Historical Provider, MD  diltiazem (CARDIZEM CD) 360 MG 24 hr capsule Take 1 capsule (360 mg total) by mouth  daily. 08/31/15  Yes Burtis Junes, NP  docusate sodium (COLACE) 100 MG capsule Take 100 mg by mouth 2 (two) times daily.   Yes Historical Provider, MD  dorzolamide-timolol (COSOPT) 22.3-6.8 MG/ML ophthalmic solution Place 1 drop into the right eye 2 (two) times daily.    Yes Historical Provider, MD  folic acid-vitamin b complex-vitamin c-selenium-zinc (DIALYVITE) 3 MG TABS tablet Take 1 tablet by mouth daily.   Yes Historical Provider, MD  lidocaine-prilocaine (EMLA) cream Apply 1 application topically as needed (apply to skin near dialysis port before dialysis).  08/31/14  Yes Historical Provider, MD  metoprolol (LOPRESSOR) 50 MG tablet Take 1 tablet (50 mg total) by mouth 2 (two) times daily. 08/15/15  Yes Isaiah Serge, NP  Multiple Vitamin (MULTIVITAMIN WITH MINERALS) TABS tablet Take 1 tablet by mouth daily. 04/17/15  Yes Belkys A Regalado, MD  prednisoLONE acetate (PRED FORTE) 1 % ophthalmic suspension Place 1 drop into the left eye 2 (two) times daily. Patient to use for 37 days. Filled on 03-28-15   Yes Historical Provider, MD  SENSIPAR 60 MG tablet Take 60 mg by mouth at bedtime.  09/03/14  Yes Historical Provider, MD  tobramycin-dexamethasone [facility]) ophthalmic solution Place 1 drop into the left eye 4 (four) times daily. For 37 days. Filled 03-31-15   Yes Historical Provider, MD  TRAVATAN Z 0.004 % SOLN ophthalmic solution Place 1 drop into the right eye at bedtime.  08/30/14  Yes Historical Provider, MD  warfarin (COUMADIN) 5 MG tablet Take as directed by [facility] Patient taking differently: Take 7.5 mg by mouth See admin instructions. 7.5 mg daily 07/09/15  Yes Belva Crome, MD    Physical Exam:    Vitals:   09/22/15 1045 09/22/15 1100 09/22/15 1115 09/22/15 1129  BP: 153/84 152/82 148/84   Pulse: (!) 59 (!) 59 (!) 59 (P) 71  Resp: _0 (P) 19  Temp:    (P) 98.6 F (37 C)  TempSrc:      SpO2: 100% 99% 98%   Weight:      Height:           Constitutional: NAD, calm,  comfortable  Vitals:   09/22/15 1045 09/22/15 1100 09/22/15 1115 09/22/15 1129  BP: 153/84 152/82 148/84   Pulse: (!) 59 (!) 59 (!) 59 (P) 71  Resp: _1 (P) 19  Temp:    (P) 98.6 F (37 C)  TempSrc:      SpO2: 100% 99% 98%   Weight:      Height:  Eyes: Right  PERRL, lids and conjunctivae normal, L eye blind ENMT: Mucous membranes are moist. Posterior pharynx clear of any exudate or lesions.Edentulous  Neck: normal, supple, no masses, no thyromegaly Respiratory:  no wheezing, bibasilar  crackles. Normal respiratory effort. No accessory muscle use.  Cardiovascular: Regular rate and rhythm, 2/6  murmurs / rubs / gallops. No extremity edema. 2+ pedal pulses. No carotid bruits.  Abdomen: no tenderness, no masses palpated. No hepatosplenomegaly. Bowel sounds positive.  Musculoskeletal: no clubbing / cyanosis. No joint deformity upper and lower extremities. Good ROM, no contractures. Normal muscle tone.  Skin: no rashes, lesions, ulcers.  Neurologic: CN 2-12 grossly intact. Sensation intact, DTR normal. Strength 5/5 in all 4.  Psychiatric: Normal judgment and insight. Alert and oriented x 3. Normal mood.     Labs on Admission: I have personally reviewed following labs and imaging studies  CBC:  Recent Labs Lab 09/22/15 0833  WBC 8.5  NEUTROABS 6.8  HGB 11.0*  HCT 34.0*  MCV 91.9  PLT 025    Basic Metabolic Panel:  Recent Labs Lab 09/22/15 0833  NA 133*  K 4.2  CL 90*  CO2 29  GLUCOSE 263*  BUN 48*  CREATININE 10.68*  CALCIUM 8.6*    GFR: Estimated Creatinine Clearance: 6.8 mL/min (by C-G formula based on SCr of 10.68 mg/dL).  Liver Function Tests: No results for input(s): AST, ALT, ALKPHOS, BILITOT, PROT, ALBUMIN in the last 168 hours. No results for input(s): LIPASE, AMYLASE in the last 168 hours. No results for input(s): AMMONIA in the last 168 hours.  Coagulation Profile:  Recent Labs Lab 09/16/15  INR 4.0    Cardiac Enzymes: No results  for input(s): CKTOTAL, CKMB, CKMBINDEX, TROPONINI in the last 168 hours.  BNP (last 3 results) No results for input(s): PROBNP in the last 8760 hours.  HbA1C: No results for input(s): HGBA1C in the last 72 hours.  CBG: No results for input(s): GLUCAP in the last 168 hours.  Lipid Profile: No results for input(s): CHOL, HDL, LDLCALC, TRIG, CHOLHDL, LDLDIRECT in the last 72 hours.  Thyroid Function Tests: No results for input(s): TSH, T4TOTAL, FREET4, T3FREE, THYROIDAB in the last 72 hours.  Anemia Panel: No results for input(s): VITAMINB12, FOLATE, FERRITIN, TIBC, IRON, RETICCTPCT in the last 72 hours.  Urine analysis:    Component Value Date/Time   LABSPEC 1.015 01/26/2010 1707   PHURINE 6.5 01/26/2010 1707   GLUCOSEU 100 (A) 01/26/2010 1707   HGBUR SMALL (A) 01/26/2010 1707   BILIRUBINUR NEGATIVE 01/26/2010 1707   KETONESUR NEGATIVE 01/26/2010 1707   PROTEINUR >300 (A) 01/26/2010 1707   UROBILINOGEN 0.2 01/26/2010 1707   NITRITE NEGATIVE 01/26/2010 1707   LEUKOCYTESUR NEGATIVE 01/26/2010 1707    Sepsis Labs: _0 (procalcitonin:4,lacticidven:4) )No results found for this or any previous visit (from the past 240 hour(s)).   Radiological Exams on Admission: Dg Chest 2 View  Result Date: 09/22/2015 CLINICAL DATA:  Chronic renal failure with weakness. History anemia and cardiac arrhythmia EXAM: CHEST  2 VIEW COMPARISON:  August 12, 2015 FINDINGS: There is moderate generalized interstitial edema. There is a small right pleural effusion. There is cardiomegaly with pulmonary venous hypertension. No adenopathy. No appreciable airspace consolidation. IMPRESSION: Evidence of a degree of congestive heart failure without airspace consolidation. Electronically Signed   By: Lowella Grip III M.D.   On: 09/22/2015 08:54    EKG: Independently reviewed.  Assessment/Plan Active Problems:   Atrial flutter (HCC)   Hypertension   Peripheral neuropathy ([place])  Diabetes mellitus  with ESRD (end-stage renal disease) ([place])   Blind left eye   ESRD (end stage renal disease) (HCC)   Elevated troponin   Anemia   Acute respiratory failure with hypoxia (HCC)   Physical deconditioning  Acute respiratory failure with hypoxia secondary to fluid overload due to ESRD with a component of diastolic CHF. Last HD Monday  8/21 WBC normal at 8.5  O2 87 in % on room air, normalizing in the 90s on 3 l,  Non-tachycardic. Afebrile.Last 2-D echo on July 2017 showed a mild LV hypertrophy, EF 37-85%, normal systolic function, and likely grade 1 diastolic dysfunction. BNP 568.9. . Weight has not significantly fluctuated since July, average 227, today at 225 lbs CXR with evidence of  CHF without airspace consolidation . Received albuterol nebs without significant improvement  - Admit to IP Telemetry  CHF focus order set  Dr Melvia Heaps, Nephrology to see for likely HD today   Daily weights, strict I/o  Coontinue nebs as needed    ESRD on HD MWF  As above mentioned  Cr 10.68  EGFR 5  Nephrology involved, Dr. Jonnie Finner notified. Renal Diet. Other plans as per Nephrology Check CMET in am   Hypertension BP 148/84    Controlled Continue home anti-hypertensive medications       Atrial Fibrillation CHA2DS2-VASc score 5 , with RVR, with multiple admissions, last on 08/12/2015, previous history of PA F on anticoagulation with Coumadin .Currently rate controlled . EKG SR without significant changes from prior. Troponin 0.05  No palpitations -Continue meds  Repeat EKG and serial troponins, likely abnormal due to increased O2 demand, expected to normalize  Anemia of chronic disease Hemoglobin  11 on admission.  No intervention is indicated at this time   Type II Diabetes Current blood sugar level is 263 Lab Results  Component Value Date   HGBA1C 6.3 (H) 04/12/2015  Hgb A1C DM order set SSI Heart healthy carb modified diet.   Deconditioning  PT/OT while  DVT prophylaxis:   Coumadin   Code  Status:   Full    Family Communication:  Discussed with patient  Disposition Plan: Expect patient to be discharged to home after condition improves Consults called:   Nephrology Admission status: Inpatient tele  [facility] E, PA-C [facility]   If 7PM-7AM, please contact night-coverage [email or web address] Password [facility]  09/22/2015, 11:49 AM

## 2015-09-22 NOTE — Progress Notes (Signed)
ANTICOAGULATION CONSULT NOTE - Initial Consult  Pharmacy Consult for Coumadin Indication: atrial fibrillation  Allergies  Allergen Reactions  . Diltiazem Other (See Comments)    SEVERE STOMACH CRAMPING PER PT    Patient Measurements: Height: 6\' 4"  (193 cm) Weight: 223 lb 1.7 oz (101.2 kg) IBW/kg (Calculated) : 86.8  Vital Signs: Temp: 98.6 F (37 C) (08/23 1946) Temp Source: Oral (08/23 1946) BP: 141/87 (08/23 1946) Pulse Rate: 50 (08/23 1946)  Labs:  Recent Labs  09/22/15 0833 09/22/15 1845  HGB 11.0*  --   HCT 34.0*  --   PLT 177  --   LABPROT  --  28.3*  INR  --  2.60  CREATININE 10.68*  --     Estimated Creatinine Clearance: 6.8 mL/min (by C-G formula based on SCr of 10.68 mg/dL).   Medical History: Past Medical History:  Diagnosis Date  . Anemia   . Atrial flutter (HCC)   . Bradycardia    a. unable to tolerate beta blockers in the past.  . Diabetes mellitus (HCC)   . ESRD (end stage renal disease) on dialysis [facility](HCC)    "MWF; [address]" (04/08/2015)  . Glaucoma   . Gout   . Hypertension   . PAF (paroxysmal atrial fibrillation) (HCC)   . Peripheral neuropathy (HCC)   . PSVT (paroxysmal supraventricular tachycardia) (HCC)    a. h/o ablation.   Assessment:   82 yr old male to continue Coumadin for afib.   Outpt INR 4.0 on 09/16/15. Coumadin held x 1 day, then usual regimen of 7.5 mg daily resumed.  INR 2.6 tonight. Therapeutic.  Goal of Therapy:  INR 2-3 Monitor platelets by anticoagulation protocol: Yes   Plan:    Continue Coumadin 7.5 mg daily.   Daily PT/INR for now.  Dennie Fettersgan, Evoleht Hovatter Donovan, [place] Pager: [phone] 09/22/2015,8:12 PM

## 2015-09-22 NOTE — ED Notes (Signed)
PA-C at bedside 

## 2015-09-23 ENCOUNTER — Inpatient Hospital Stay (HOSPITAL_COMMUNITY): Payer: Medicare Other

## 2015-09-23 DIAGNOSIS — D508 Other iron deficiency anemias: Secondary | ICD-10-CM

## 2015-09-23 LAB — COMPREHENSIVE METABOLIC PANEL
ALBUMIN: 3.4 g/dL — AB (ref 3.5–5.0)
ALT: 43 U/L (ref 17–63)
AST: 28 U/L (ref 15–41)
Alkaline Phosphatase: 72 U/L (ref 38–126)
Anion gap: 12 (ref 5–15)
BUN: 23 mg/dL — AB (ref 6–20)
CHLORIDE: 92 mmol/L — AB (ref 101–111)
CO2: 27 mmol/L (ref 22–32)
CREATININE: 6.2 mg/dL — AB (ref 0.61–1.24)
Calcium: 8.9 mg/dL (ref 8.9–10.3)
GFR calc Af Amer: 9 mL/min — ABNORMAL LOW (ref >60)
GFR, EST NON AFRICAN AMERICAN: 8 mL/min — AB (ref >60)
GLUCOSE: 239 mg/dL — AB (ref 65–99)
POTASSIUM: 3.3 mmol/L — AB (ref 3.5–5.1)
Sodium: 131 mmol/L — ABNORMAL LOW (ref 135–145)
Total Bilirubin: 0.9 mg/dL (ref 0.3–1.2)
Total Protein: 8.4 g/dL — ABNORMAL HIGH (ref 6.5–8.1)

## 2015-09-23 LAB — CBC WITH DIFFERENTIAL/PLATELET
BASOS ABS: 0 10*3/uL (ref 0.0–0.1)
BASOS PCT: 1 %
EOS PCT: 2 %
Eosinophils Absolute: 0.1 10*3/uL (ref 0.0–0.7)
HCT: 34.7 % — ABNORMAL LOW (ref 39.0–52.0)
Hemoglobin: 11.3 g/dL — ABNORMAL LOW (ref 13.0–17.0)
LYMPHS PCT: 22 %
Lymphs Abs: 1.2 10*3/uL (ref 0.7–4.0)
MCH: 29.5 pg (ref 26.0–34.0)
MCHC: 32.6 g/dL (ref 30.0–36.0)
MCV: 90.6 fL (ref 78.0–100.0)
MONO ABS: 0.7 10*3/uL (ref 0.1–1.0)
Monocytes Relative: 12 %
NEUTROS ABS: 3.5 10*3/uL (ref 1.7–7.7)
Neutrophils Relative %: 63 %
PLATELETS: 200 10*3/uL (ref 150–400)
RBC: 3.83 MIL/uL — AB (ref 4.22–5.81)
RDW: 16.4 % — AB (ref 11.5–15.5)
WBC: 5.5 10*3/uL (ref 4.0–10.5)

## 2015-09-23 LAB — HEMOGLOBIN A1C
Hgb A1c MFr Bld: 8.8 % — ABNORMAL HIGH (ref 4.8–5.6)
Mean Plasma Glucose: 206 mg/dL

## 2015-09-23 LAB — GLUCOSE, CAPILLARY
GLUCOSE-CAPILLARY: 189 mg/dL — AB (ref 65–99)
GLUCOSE-CAPILLARY: 178 mg/dL — AB (ref 65–99)
Glucose-Capillary: 201 mg/dL — ABNORMAL HIGH (ref 65–99)

## 2015-09-23 LAB — PROTIME-INR
INR: 2.75
Prothrombin Time: 29.6 seconds — ABNORMAL HIGH (ref 11.4–15.2)

## 2015-09-23 MED ORDER — HEPARIN SODIUM (PORCINE) 1000 UNIT/ML DIALYSIS: 1000.0000 [IU] | INTRAMUSCULAR | Status: DC | PRN

## 2015-09-23 MED ORDER — SODIUM CHLORIDE 0.9 % IV SOLN: 100.0000 mL | INTRAVENOUS | Status: DC | PRN

## 2015-09-23 MED ORDER — ALTEPLASE 2 MG IJ SOLR: 2.0000 mg | Freq: Once | INTRAMUSCULAR | Status: DC | PRN

## 2015-09-23 MED ORDER — PENTAFLUOROPROP-TETRAFLUOROETH EX AERO: 1.0000 "application " | INHALATION_SPRAY | CUTANEOUS | Status: DC | PRN

## 2015-09-23 MED ORDER — LIDOCAINE HCL (PF) 1 % IJ SOLN: 5.0000 mL | INTRAMUSCULAR | Status: DC | PRN

## 2015-09-23 MED ORDER — HEPARIN SODIUM (PORCINE) 1000 UNIT/ML DIALYSIS: 2400.0000 [IU] | Freq: Once | INTRAMUSCULAR | Status: DC

## 2015-09-23 MED ORDER — LIDOCAINE-PRILOCAINE 2.5-2.5 % EX CREA: 1.0000 "application " | TOPICAL_CREAM | CUTANEOUS | Status: DC | PRN

## 2015-09-23 NOTE — Progress Notes (Signed)
PROGRESS NOTE                                                                                                                                                                                                             Patient Demographics:    [name] [name], is a 82 y.o. male, DOB - July 01, 1933, ZOX:[id number]  Admit date - 09/22/2015   Admitting Physician Ozella Rocksavid J Merrell, MD  Outpatient Primary MD for the patient is Katy ApoPOLITE,RONALD D, MD  LOS - 1  Chief Complaint  Patient presents with  . Weakness  . Shortness of Breath       Brief Narrative    [name] [name] is a 82 y.o. male with medical history significant for ESRD on hemodialysis Monday Wednesday and Friday, HTN diabetes, atrial fibrillation with RVR, with multiple admissions, last on 08/12/2015, previous history of PA F , s/p ablation for PSVT, presenting today to the emergency department with generalized weakness and 1 week history of progressive dyspnea, accompanied by productive frothy cough. He denies rhinorrhea or hemoptysis. Nonsmoker, and the patient is not on home oxygen. On EMS arrived, the patient was hypoxic, at 87%, placed on 2 L with some relief. He denies fevers, chills, chest pain or palpitations, nausea vomiting or abdominal pain. HEENT reports decreased appetite. He makesminimal urine. He denies any lower extremities swelling . He denies any recent infections. No sick contacts. Denies any recent distance travel.  He denies any syncope, presyncope or vertigo. No changes in his meds and he is compliant. LAst dialysis was on Monday    Subjective:    [name] [name] today has, No headache, No chest pain, No abdominal pain - No Nausea, No new weakness tingling or numbness, No Cough - SOB.     Assessment  & Plan :     1. Acute on Chronic diastolic CHF with EF 60% on echocardiogram done 2 weeks ago. Patient has ESRD, nephrology on board, being dialyzed for fluid  removal, shortness of breath is improved, denies any chest pain. Continue beta blocker.  2. ESRD, Monday, Wednesday and Friday schedule. Renal following.  3. Chronic atrial fibrillation [name] of 5. Patient on Coumadin, beta blocker and Cardizem, continue, goal will be rate controlled. Will see monitoring Coumadin.  4. Anemia of chronic disease. Stable no acute issues.  5. Essential  hypertension. Continue home regimen unchanged.  6. DM type II. On sliding scale and monitor.  Lab Results  Component Value Date   HGBA1C 8.8 (H) 09/22/2015   CBG (last 3)   Recent Labs  09/22/15 1652 09/22/15 1946 09/23/15 0729  GLUCAP 138* 190* 189*      Family Communication  :  None present  Code Status :  Full  Diet : Renal  Disposition Plan  :  Home in am  Consults  :  Renal  Procedures  :    DVT Prophylaxis  :  Coumadin  Lab Results  Component Value Date   INR 2.60 09/22/2015   INR 4.0 09/16/2015   INR 3.3 08/31/2015     Lab Results  Component Value Date   PLT 177 09/22/2015    Inpatient Medications  Scheduled Meds: . atorvastatin  10 mg Oral q1800  . atropine  1 drop Left Eye q12n4p  . B-complex with vitamin C  1 tablet Oral Daily  . calcium acetate  1,334 mg Oral TID WC  . cinacalcet  60 mg Oral Q supper  . darbepoetin (ARANESP) injection - DIALYSIS  25 mcg Intravenous Q Wed-HD  . diltiazem  360 mg Oral Daily  . dorzolamide-timolol  1 drop Right Eye BID  . doxercalciferol  3 mcg Intravenous Q M,W,F-HD  . ferric gluconate (FERRLECIT/NULECIT) IV  62.5 mg Intravenous Q Wed-HD  . insulin aspart  0-9 Units Subcutaneous TID WC  . latanoprost  1 drop Right Eye QHS  . metoprolol  50 mg Oral BID  . prednisoLONE acetate  1 drop Left Eye BID  . sodium chloride flush  3 mL Intravenous Q12H  . tobramycin-dexamethasone  1 drop Left Eye QID  . warfarin  7.5 mg Oral q1800  . Warfarin - Pharmacist Dosing Inpatient   Does not apply q1800   Continuous  Infusions: . sodium chloride     PRN Meds:.acetaminophen **OR** acetaminophen, bisacodyl, HYDROcodone-acetaminophen, magnesium citrate, ondansetron **OR** ondansetron (ZOFRAN) IV, senna-docusate, traZODone  Antibiotics  :    Anti-infectives    None         Objective:   Vitals:   09/22/15 1622 09/22/15 1946 09/23/15 0448 09/23/15 0759  BP: 140/85 (!) 141/87 (!) 154/75 137/68  Pulse: 69 (!) 50 70 76  Resp: 16 17 17 18   Temp: 98.7 F (37.1 C) 98.6 F (37 C) 98.4 F (36.9 C) 98.4 F (36.9 C)  TempSrc: Oral Oral Oral Oral  SpO2: 94% 94% 96% 98%  Weight:  101.2 kg (223 lb 1.7 oz)    Height:        Wt Readings from Last 3 Encounters:  09/22/15 101.2 kg (223 lb 1.7 oz)  08/31/15 104.1 kg (229 lb 6.4 oz)  08/15/15 103.1 kg (227 lb 3.2 oz)     Intake/Output Summary (Last 24 hours) at 09/23/15 0948 Last data filed at 09/23/15 0600  Gross per 24 hour  Intake              480 ml  Output                0 ml  Net              480 ml     Physical Exam  Awake, mildly confused, Oriented X 2, No new F.N deficits, Normal affect,  blind in left eye chronically [place].AT,PERRAL Supple Neck,No JVD, No cervical lymphadenopathy appriciated.  Symmetrical Chest wall movement, Good air movement bilaterally, CTAB RRR,No  Gallops,Rubs or new Murmurs, No Parasternal Heave +ve B.Sounds, Abd Soft, No tenderness, No organomegaly appriciated, No rebound - guarding or rigidity. No Cyanosis, Clubbing or edema, No new Rash or bruise      Data Review:    CBC  Recent Labs Lab 09/22/15 0833  WBC 8.5  HGB 11.0*  HCT 34.0*  PLT 177  MCV 91.9  MCH 29.7  MCHC 32.4  RDW 16.7*  LYMPHSABS 0.8  MONOABS 0.9  EOSABS 0.0  BASOSABS 0.0    Chemistries   Recent Labs Lab 09/22/15 0833  NA 133*  K 4.2  CL 90*  CO2 29  GLUCOSE 263*  BUN 48*  CREATININE 10.68*  CALCIUM 8.6*    ------------------------------------------------------------------------------------------------------------------ No results for input(s): CHOL, HDL, LDLCALC, TRIG, CHOLHDL, LDLDIRECT in the last 72 hours.  Lab Results  Component Value Date   HGBA1C 8.8 (H) 09/22/2015   ------------------------------------------------------------------------------------------------------------------ No results for input(s): TSH, T4TOTAL, T3FREE, THYROIDAB in the last 72 hours.  Invalid input(s): FREET3 ------------------------------------------------------------------------------------------------------------------ No results for input(s): VITAMINB12, FOLATE, FERRITIN, TIBC, IRON, RETICCTPCT in the last 72 hours.  Coagulation profile  Recent Labs Lab 09/22/15 1845  INR 2.60    No results for input(s): DDIMER in the last 72 hours.  Cardiac Enzymes No results for input(s): CKMB, TROPONINI, MYOGLOBIN in the last 168 hours.  Invalid input(s): CK ------------------------------------------------------------------------------------------------------------------    Component Value Date/Time   BNP 568.9 (H) 09/22/2015 1610    Micro Results Recent Results (from the past 240 hour(s))  MRSA PCR Screening     Status: None   Collection Time: 09/22/15  5:03 PM  Result Value Ref Range Status   MRSA by PCR NEGATIVE NEGATIVE Final    Comment:        The GeneXpert MRSA Assay (FDA approved for NASAL specimens only), is one component of a comprehensive MRSA colonization surveillance program. It is not intended to diagnose MRSA infection nor to guide or monitor treatment for MRSA infections.     Radiology Reports Dg Chest 2 View  Result Date: 09/23/2015 CLINICAL DATA:  Chronic cough, history of atrial flutter, diabetes, end-stage renal disease, suspect fluid overload. EXAM: CHEST  2 VIEW COMPARISON:  PA and lateral chest x-ray of September 22, 2015 FINDINGS: The lungs are slightly less well  inflated today. The interstitial markings remain increased. The cardiac silhouette remains enlarged. The central pulmonary vascularity is engorged but stable. No significant pleural effusion is observed. The bony thorax is unremarkable. IMPRESSION: Mild pulmonary interstitial edema secondary to CHF which has improved since yesterday's study. Electronically Signed   By: David  [place] M.D.   On: 09/23/2015 07:21   Dg Chest 2 View  Result Date: 09/22/2015 CLINICAL DATA:  Chronic renal failure with weakness. History anemia and cardiac arrhythmia EXAM: CHEST  2 VIEW COMPARISON:  August 12, 2015 FINDINGS: There is moderate generalized interstitial edema. There is a small right pleural effusion. There is cardiomegaly with pulmonary venous hypertension. No adenopathy. No appreciable airspace consolidation. IMPRESSION: Evidence of a degree of congestive heart failure without airspace consolidation. Electronically Signed   By: Bretta Bang III M.D.   On: 09/22/2015 08:54    Time Spent in minutes  30   Jerica Creegan K M.D on 09/23/2015 at 9:48 AM  Between 7am to 7pm - Pager - [phone]  After 7pm go to [email or web address] - password [facility]  [facility] -  Office  [phone]

## 2015-09-23 NOTE — Progress Notes (Signed)
ANTICOAGULATION CONSULT NOTE   Pharmacy Consult for warfarin  Indication: atrial fibrillation  Allergies  Allergen Reactions  . Diltiazem Other (See Comments)    SEVERE STOMACH CRAMPING PER PT    Patient Measurements: Height: 6\' 4"  (193 cm) Weight: 223 lb 1.7 oz (101.2 kg) IBW/kg (Calculated) : 86.8  Vital Signs: Temp: 98.4 F (36.9 C) (08/24 0759) Temp Source: Oral (08/24 0759) BP: 137/68 (08/24 0759) Pulse Rate: 76 (08/24 0759)  Labs:  Recent Labs  09/22/15 0833 09/22/15 1845  HGB 11.0*  --   HCT 34.0*  --   PLT 177  --   LABPROT  --  28.3*  INR  --  2.60  CREATININE 10.68*  --     Estimated Creatinine Clearance: 6.8 mL/min (by C-G formula based on SCr of 10.68 mg/dL).   Medical History: Past Medical History:  Diagnosis Date  . Anemia   . Atrial flutter (HCC)   . Bradycardia    a. unable to tolerate beta blockers in the past.  . Diabetes mellitus (HCC)   . ESRD (end stage renal disease) on dialysis [facility](HCC)    "MWF; [address]" (04/08/2015)  . Glaucoma   . Gout   . Hypertension   . PAF (paroxysmal atrial fibrillation) (HCC)   . Peripheral neuropathy (HCC)   . PSVT (paroxysmal supraventricular tachycardia) (HCC)    a. h/o ablation.   Assessment: 82 yr old male to continue warfarin for afib. INR 2.6 last evening, therapeutic, with stable CBC.   Goal of Therapy:  INR 2-3 Monitor platelets by anticoagulation protocol: Yes   Plan:  1. Continue Coumadin 7.5 mg daily (PTA dose) 2. Daily PT/INR starting on 8/25  Pollyann SamplesAndy Josue Kass, PharmD, BCPS 09/23/2015, 8:33 AM Pager: [phone]

## 2015-09-23 NOTE — Progress Notes (Signed)
  [facility] Progress Note   Subjective: no c/o, breathing is better  Vitals:   09/22/15 1622 09/22/15 1946 09/23/15 0448 09/23/15 0759  BP: 140/85 (!) 141/87 (!) 154/75 137/68  Pulse: 69 (!) 50 70 76  Resp: 16 17 17 18   Temp: 98.7 F (37.1 C) 98.6 F (37 C) 98.4 F (36.9 C) 98.4 F (36.9 C)  TempSrc: Oral Oral Oral Oral  SpO2: 94% 94% 96% 98%  Weight:  101.2 kg (223 lb 1.7 oz)    Height:        Inpatient medications: . atorvastatin  10 mg Oral q1800  . atropine  1 drop Left Eye q12n4p  . B-complex with vitamin C  1 tablet Oral Daily  . calcium acetate  1,334 mg Oral TID WC  . cinacalcet  60 mg Oral Q supper  . darbepoetin (ARANESP) injection - DIALYSIS  25 mcg Intravenous Q Wed-HD  . diltiazem  360 mg Oral Daily  . dorzolamide-timolol  1 drop Right Eye BID  . doxercalciferol  3 mcg Intravenous Q M,W,F-HD  . ferric gluconate (FERRLECIT/NULECIT) IV  62.5 mg Intravenous Q Wed-HD  . insulin aspart  0-9 Units Subcutaneous TID WC  . latanoprost  1 drop Right Eye QHS  . metoprolol  50 mg Oral BID  . prednisoLONE acetate  1 drop Left Eye BID  . sodium chloride flush  3 mL Intravenous Q12H  . tobramycin-dexamethasone  1 drop Left Eye QID  . warfarin  7.5 mg Oral q1800  . Warfarin - Pharmacist Dosing Inpatient   Does not apply q1800   . sodium chloride     acetaminophen **OR** acetaminophen, bisacodyl, HYDROcodone-acetaminophen, magnesium citrate, ondansetron **OR** ondansetron (ZOFRAN) IV, senna-docusate, traZODone  Exam: Alert, no distress, L eye blind No jvd Chest clear bilat RRR no mrg Abd soft ntnd no ascites Ext no edema Alert, nf LUA AVF+bruit  Dialysis: [facility] MWF  4h  101.5kg   Hep 4200    Hect 3  Venofer 50  Mircera 75 q 2 last 8.9      Assessment: 1.  SOB / pulm edema - down to dry wt after 4L UF yest, need more vol off 2.  ESRD MWF hd 3.  HTN on dilt/ MTP, BP's still up 4.  DM 5.  Afib on coumadin  Plan - extra HD today, would only  agree to 2 hours, then again tomorrow   Vinson Moselleob Ilyas Lipsitz MD [facility] pager [phone]    cell [phone] 09/23/2015, 9:26 AM    Recent Labs Lab 09/22/15 0833  NA 133*  K 4.2  CL 90*  CO2 29  GLUCOSE 263*  BUN 48*  CREATININE 10.68*  CALCIUM 8.6*   No results for input(s): AST, ALT, ALKPHOS, BILITOT, PROT, ALBUMIN in the last 168 hours.  Recent Labs Lab 09/22/15 0833  WBC 8.5  NEUTROABS 6.8  HGB 11.0*  HCT 34.0*  MCV 91.9  PLT 177   Iron/TIBC/Ferritin/ %Sat    Component Value Date/Time   IRON 25 (L) 01/27/2010 1327   TIBC 207 (L) 01/27/2010 1327   FERRITIN 262 01/27/2010 1327   IRONPCTSAT 12 (L) 01/27/2010 1327

## 2015-09-23 NOTE — Evaluation (Signed)
Physical Therapy Evaluation Patient Details Name: [name] [name] MRN: [id number] DOB: [birth year] Today's Date: 09/23/2015   History of Present Illness  82 yo male with onset of acute respiratory failure and hypoxia, CHF with pulm edema was admitted, receiving HD today.  PMHx:  HTN, DM, a-fib with RVR, ablation for PSVT, EF 60%, ESRD, anemia,   Clinical Impression  Pt is up to chair with PT after standing attempts and able to fairly well move, but is going to need to climb a step to enter house.  PT will need to see again to mobilize and increase LE strength, although pt is not far from his described PLOF.  Will reconsider HHPT to SNF if pt cannot progress as planned with acute therapy.    Follow Up Recommendations Home health PT;Supervision for mobility/OOB    Equipment Recommendations  None recommended by PT    Recommendations for Other Services Rehab consult     Precautions / Restrictions Precautions Precautions: Fall (telemetry) Restrictions Weight Bearing Restrictions: No      Mobility  Bed Mobility Overal bed mobility: Needs Assistance Bed Mobility: Supine to Sit     Supine to sit: Mod assist (using bed pad to assist sliding)        Transfers Overall transfer level: Needs assistance Equipment used: Rolling walker (2 wheeled);1 person hand held assist Transfers: Sit to/from Stand;Stand Pivot Transfers Sit to Stand: From elevated surface;Mod assist Stand pivot transfers: Mod assist;From elevated surface       General transfer comment: pt used bed without reminding for hand placemetn but reminded to reach back to chair  Ambulation/Gait             General Gait Details: unable to perform due to weakness  Stairs            Wheelchair Mobility    Modified Rankin (Stroke Patients Only)       Balance Overall balance assessment: Needs assistance Sitting-balance support: Feet supported Sitting balance-Leahy Scale: Good     Standing balance  support: Bilateral upper extremity supported Standing balance-Leahy Scale: Fair                               Pertinent Vitals/Pain Pain Assessment: No/denies pain    Home Living Family/patient expects to be discharged to:: Private residence Living Arrangements: Spouse/significant other Available Help at Discharge: Family;Available 24 hours/day;Other (Comment) Type of Home: House Home Access: Stairs to enter Entrance Stairs-Rails: None Entrance Stairs-Number of Steps: 1 Home Layout: One level Home Equipment: Cane - single point;Walker - 2 wheels Additional Comments: recently using RW for short trips but not very many walks    Prior Function Level of Independence: Needs assistance   Gait / Transfers Assistance Needed: Uses RW  ADL's / Homemaking Assistance Needed: wife assists bathing and dressing which PT confirmed with donning pants        Hand Dominance   Dominant Hand: Right    Extremity/Trunk Assessment   Upper Extremity Assessment: Overall WFL for tasks assessed           Lower Extremity Assessment: Generalized weakness      Cervical / Trunk Assessment: Normal  Communication   Communication: Other (comment) (speech difficult to understand)  Cognition Arousal/Alertness: Awake/alert Behavior During Therapy: WFL for tasks assessed/performed Overall Cognitive Status: Within Functional Limits for tasks assessed  General Comments      Exercises        Assessment/Plan    PT Assessment Patient needs continued PT services  PT Diagnosis Difficulty walking;Generalized weakness   PT Problem List Decreased strength;Decreased range of motion;Decreased activity tolerance;Decreased balance;Decreased mobility;Decreased coordination;Cardiopulmonary status limiting activity;Decreased skin integrity  PT Treatment Interventions DME instruction;Gait training;Stair training;Functional mobility training;Therapeutic  activities;Therapeutic exercise;Balance training;Neuromuscular re-education;Patient/family education   PT Goals (Current goals can be found in the Care Plan section) Acute Rehab PT Goals Patient Stated Goal: to get home PT Goal Formulation: With patient/family Time For Goal Achievement: 10/07/15 Potential to Achieve Goals: Good    Frequency Min 3X/week   Barriers to discharge Inaccessible home environment (has a step to navigate) will need to demonstrate a step to maneuver in the house    Co-evaluation               End of Session Equipment Utilized During Treatment: Gait belt Activity Tolerance: Patient limited by fatigue Patient left: in chair;with call bell/phone within reach;with family/visitor present;with nursing/sitter in room Nurse Communication: Mobility status         Time: [id number] PT Time Calculation (min) (ACUTE ONLY): 23 min   Charges:   PT Evaluation $PT Eval Low Complexity: 1 Procedure PT Treatments $Therapeutic Activity: 8-22 mins   PT G Codes:        Ivar DrapeStout, Daniella Dewberry E 09/23/2015, 11:17 AM    Samul Dadauth Amauria Younts, PT MS Acute Rehab Dept. Number: [facility] [phone] and [facility] [phone]

## 2015-09-24 LAB — CBC
HCT: 31.1 % — ABNORMAL LOW (ref 39.0–52.0)
HEMOGLOBIN: 10.1 g/dL — AB (ref 13.0–17.0)
MCH: 29.3 pg (ref 26.0–34.0)
MCHC: 32.5 g/dL (ref 30.0–36.0)
MCV: 90.1 fL (ref 78.0–100.0)
PLATELETS: 160 10*3/uL (ref 150–400)
RBC: 3.45 MIL/uL — AB (ref 4.22–5.81)
RDW: 16.5 % — ABNORMAL HIGH (ref 11.5–15.5)
WBC: 5.8 10*3/uL (ref 4.0–10.5)

## 2015-09-24 LAB — RENAL FUNCTION PANEL
ANION GAP: 12 (ref 5–15)
Albumin: 3.3 g/dL — ABNORMAL LOW (ref 3.5–5.0)
BUN: 31 mg/dL — ABNORMAL HIGH (ref 6–20)
CALCIUM: 8.9 mg/dL (ref 8.9–10.3)
CHLORIDE: 92 mmol/L — AB (ref 101–111)
CO2: 28 mmol/L (ref 22–32)
CREATININE: 7.62 mg/dL — AB (ref 0.61–1.24)
GFR, EST AFRICAN AMERICAN: 7 mL/min — AB (ref >60)
GFR, EST NON AFRICAN AMERICAN: 6 mL/min — AB (ref >60)
Glucose, Bld: 203 mg/dL — ABNORMAL HIGH (ref 65–99)
Phosphorus: 3.6 mg/dL (ref 2.5–4.6)
Potassium: 3.6 mmol/L (ref 3.5–5.1)
SODIUM: 132 mmol/L — AB (ref 135–145)

## 2015-09-24 LAB — GLUCOSE, CAPILLARY: GLUCOSE-CAPILLARY: 126 mg/dL — AB (ref 65–99)

## 2015-09-24 LAB — PROTIME-INR
INR: 3.07
Prothrombin Time: 32.4 seconds — ABNORMAL HIGH (ref 11.4–15.2)

## 2015-09-24 MED ORDER — HEPARIN SODIUM (PORCINE) 1000 UNIT/ML DIALYSIS: 4200.0000 [IU] | Freq: Once | INTRAMUSCULAR | Status: DC

## 2015-09-24 MED ORDER — LIDOCAINE HCL (PF) 1 % IJ SOLN: 5.0000 mL | INTRAMUSCULAR | Status: DC | PRN

## 2015-09-24 MED ORDER — HEPARIN SODIUM (PORCINE) 1000 UNIT/ML DIALYSIS: 1000.0000 [IU] | INTRAMUSCULAR | Status: DC | PRN

## 2015-09-24 MED ORDER — SODIUM CHLORIDE 0.9 % IV SOLN: 100.0000 mL | INTRAVENOUS | Status: DC | PRN

## 2015-09-24 MED ORDER — ALTEPLASE 2 MG IJ SOLR: 2.0000 mg | Freq: Once | INTRAMUSCULAR | Status: DC | PRN

## 2015-09-24 MED ORDER — DOXERCALCIFEROL 4 MCG/2ML IV SOLN
INTRAVENOUS | Status: AC
  Filled 2015-09-24: qty 2

## 2015-09-24 MED ORDER — LIDOCAINE-PRILOCAINE 2.5-2.5 % EX CREA: 1.0000 "application " | TOPICAL_CREAM | CUTANEOUS | Status: DC | PRN

## 2015-09-24 MED ORDER — PENTAFLUOROPROP-TETRAFLUOROETH EX AERO: 1.0000 "application " | INHALATION_SPRAY | CUTANEOUS | Status: DC | PRN

## 2015-09-24 NOTE — Discharge Summary (Signed)
[name] [name] ZOX:[id number] DOB: 29-May-1933 DOA: 09/22/2015  PCP: Katy Apo, MD  Admit date: 09/22/2015  Discharge date: 09/24/2015  Admitted From: Home   Disposition:  Home   Recommendations for Outpatient Follow-up:   Follow up with PCP in 1-2 weeks  PCP Please obtain INR, BMP/CBC, 2 view CXR in 1week,  (see Discharge instructions)   PCP Please follow up on the following pending results: None   Home Health: HHPT   Equipment/Devices: None  Consultations: Renal Discharge Condition: Fair   CODE STATUS: Full   Diet Recommendation: Renal Low Carb   Chief Complaint  Patient presents with  . Weakness  . Shortness of Breath     Brief history of present illness from the day of admission and additional interim summary    [name] [name] a 82 y.o.malewith medical history significant for ESRD on hemodialysis Monday Wednesday and Friday, HTN diabetes, atrial fibrillation with RVR, with multiple admissions, last on 08/12/2015, previous history of PA F , s/p ablation for PSVT, presenting today to the emergency department with generalized weakness and 1 week history of progressive dyspnea, accompanied by productive frothy cough. He denies rhinorrhea or hemoptysis. Nonsmoker, and the patient is not on home oxygen. On EMS arrived, the patient was hypoxic, at 87%, placed on 2 L with some relief. He denies fevers, chills, chest pain or palpitations, nausea vomiting or abdominal pain. HEENT reports decreased appetite. He makesminimal urine. He denies any lower extremities swelling . He denies any recent infections. No sick contacts. Denies any recent distance travel. He denies any syncope, presyncope or vertigo. No changes in his meds and he is compliant. LAst dialysis was on Monday  Hospital issues addressed    1.  Acute on Chronic diastolic CHF with EF 60% on echocardiogram done 2 weeks ago. Patient has ESRD, nephrology on board, was dialyzed yesterday and today with resolution of symptoms, he stopped his own dialysis within 2 hours yesterday and I question his outpatient compliance, he was extensively counseled to comply with his dialysis treatments, wife also counseled bedside, he denies any chest pain. Continue beta blocker. Discharge home to follow with primary care physician and primary nephrologist after discharge.  2. ESRD, Monday, Wednesday and Friday schedule. Renal saw and follow the patient here.  3. Chronic atrial fibrillation [place] vasc 2 score of 5. Patient on Coumadin, beta blocker and Cardizem, continue, goal will be rate controlled. Will see monitoring Coumadin.  Lab Results  Component Value Date   INR 3.07 09/24/2015   INR 2.75 09/23/2015   INR 2.60 09/22/2015    4. Anemia of chronic disease. Stable no acute issues.  5. Essential hypertension. Continue home regimen unchanged.  6. DM type II. On sliding scale and monitor.   Discharge diagnosis     Active Problems:   Atrial flutter (HCC)   Hypertension   Peripheral neuropathy (HCC)   Diabetes mellitus with ESRD (end-stage renal disease) (HCC)   Blind left eye   ESRD (end stage renal disease) (HCC)   Elevated  troponin   Anemia   Acute respiratory failure with hypoxia [facility](HCC)   Physical deconditioning    Discharge instructions    Discharge Instructions    Discharge instructions    Complete by:  As directed   Follow with Primary MD Katy ApoPOLITE,RONALD D, MD in 7 days   Get CBC, CMP, 2 view Chest X ray checked  by Primary MD or SNF MD in 5-7 days ( we routinely change or add medications that can affect your baseline labs and fluid status, therefore we recommend that you get the mentioned basic workup next visit with your PCP, your PCP may decide not to get them or add new tests based on their clinical decision)   Activity:  As tolerated with Full fall precautions use walker/cane & assistance as needed   Disposition Home     Diet:  Renal Low Carb.  On your next visit with your primary care physician please Get Medicines reviewed and adjusted.   Please request your Prim.MD to go over all Hospital Tests and Procedure/Radiological results at the follow up, please get all Hospital records sent to your Prim MD by signing hospital release before you go home.   If you experience worsening of your admission symptoms, develop shortness of breath, life threatening emergency, suicidal or homicidal thoughts you must seek medical attention immediately by calling 911 or calling your MD immediately  if symptoms less severe.  You Must read complete instructions/literature along with all the possible adverse reactions/side effects for all the Medicines you take and that have been prescribed to you. Take any new Medicines after you have completely understood and accpet all the possible adverse reactions/side effects.   Do not drive, operate heavy machinery, perform activities at heights, swimming or participation in water activities or provide baby sitting services if your were admitted for syncope or siezures until you have seen by Primary MD or a Neurologist and advised to do so again.  Do not drive when taking Pain medications.    Do not take more than prescribed Pain, Sleep and Anxiety Medications  Special Instructions: If you have smoked or chewed Tobacco  in the last 2 yrs please stop smoking, stop any regular Alcohol  and or any Recreational drug use.  Wear Seat belts while driving.   Please note  You were cared for by a hospitalist during your hospital stay. If you have any questions about your discharge medications or the care you received while you were in the hospital after you are discharged, you can call the unit and asked to speak with the hospitalist on call if the hospitalist that took care of you is not  available. Once you are discharged, your primary care physician will handle any further medical issues. Please note that NO REFILLS for any discharge medications will be authorized once you are discharged, as it is imperative that you return to your primary care physician (or establish a relationship with a primary care physician if you do not have one) for your aftercare needs so that they can reassess your need for medications and monitor your lab values.   Increase activity slowly    Complete by:  As directed      Discharge Medications     Medication List    TAKE these medications   acetaminophen 325 MG tablet Commonly known as:  TYLENOL Take 650 mg by mouth daily as needed for mild pain.   amLODipine 10 MG tablet Commonly known as:  NORVASC Take 10 mg  by mouth daily.   atorvastatin 10 MG tablet Commonly known as:  LIPITOR Take 1 tablet (10 mg total) by mouth daily at 6 PM.   atropine 1 % ophthalmic solution Place 1 drop into the left eye 2 times daily at 12 noon and 4 pm.   Calcium Acetate 667 MG Tabs Take 1-2 capsules by mouth 3 (three) times daily. 667 mg for snacks, 1334 mg for meals   diltiazem 360 MG 24 hr capsule Commonly known as:  CARDIZEM CD Take 1 capsule (360 mg total) by mouth daily.   docusate sodium 100 MG capsule Commonly known as:  COLACE Take 100 mg by mouth 2 (two) times daily.   dorzolamide-timolol 22.3-6.8 MG/ML ophthalmic solution Commonly known as:  COSOPT Place 1 drop into the right eye 2 (two) times daily.   folic acid-vitamin b complex-vitamin c-selenium-zinc 3 MG Tabs tablet Take 1 tablet by mouth daily.   lidocaine-prilocaine cream Commonly known as:  EMLA Apply 1 application topically as needed (apply to skin near dialysis port before dialysis).   metoprolol 50 MG tablet Commonly known as:  LOPRESSOR Take 1 tablet (50 mg total) by mouth 2 (two) times daily.   multivitamin with minerals Tabs tablet Take 1 tablet by mouth daily.     prednisoLONE acetate 1 % ophthalmic suspension Commonly known as:  PRED FORTE Place 1 drop into the left eye 2 (two) times daily. Patient to use for 37 days. Filled on 03-28-15   SENSIPAR 60 MG tablet Generic drug:  cinacalcet Take 60 mg by mouth at bedtime.   TOBRADEX ophthalmic solution Generic drug:  tobramycin-dexamethasone Place 1 drop into the left eye 4 (four) times daily. For 37 days. Filled 03-31-15   TRAVATAN Z 0.004 % Soln ophthalmic solution Generic drug:  Travoprost (BAK Free) Place 1 drop into the right eye at bedtime.   warfarin 5 MG tablet Commonly known as:  COUMADIN Take as directed by Coumadin Clinic What changed:  how much to take  how to take this  when to take this  additional instructions       Follow-up Information    POLITE,RONALD D, MD. Schedule an appointment as soon as possible for a visit in 1 week(s).   Specialty:  Internal Medicine Contact information: [address] [place] [address] [phone]        Dyke Maes, MD .   Specialty:  Nephrology Contact information: [address] [place] [address] [phone]        Lesleigh Noe, MD .   Specialty:  Cardiology Contact information: [phone] [address] [place] [address] [phone]           Major procedures and Radiology Reports - PLEASE review detailed and final reports thoroughly  -        Dg Chest 2 View  Result Date: 09/23/2015 CLINICAL DATA:  Chronic cough, history of atrial flutter, diabetes, end-stage renal disease, suspect fluid overload. EXAM: CHEST  2 VIEW COMPARISON:  PA and lateral chest x-ray of September 22, 2015 FINDINGS: The lungs are slightly less well inflated today. The interstitial markings remain increased. The cardiac silhouette remains enlarged. The central pulmonary vascularity is engorged but stable. No significant pleural effusion is observed. The bony thorax is unremarkable. IMPRESSION: Mild  pulmonary interstitial edema secondary to CHF which has improved since yesterday's study. Electronically Signed   By: David  [place] M.D.   On: 09/23/2015 07:21   Dg Chest 2 View  Result Date: 09/22/2015 CLINICAL DATA:  Chronic renal failure with weakness. History anemia and cardiac arrhythmia EXAM: CHEST  2 VIEW COMPARISON:  August 12, 2015 FINDINGS: There is moderate generalized interstitial edema. There is a small right pleural effusion. There is cardiomegaly with pulmonary venous hypertension. No adenopathy. No appreciable airspace consolidation. IMPRESSION: Evidence of a degree of congestive heart failure without airspace consolidation. Electronically Signed   By: Bretta Bang III M.D.   On: 09/22/2015 08:54    Micro Results    Recent Results (from the past 240 hour(s))  MRSA PCR Screening     Status: None   Collection Time: 09/22/15  5:03 PM  Result Value Ref Range Status   MRSA by PCR NEGATIVE NEGATIVE Final    Comment:        The GeneXpert MRSA Assay (FDA approved for NASAL specimens only), is one component of a comprehensive MRSA colonization surveillance program. It is not intended to diagnose MRSA infection nor to guide or monitor treatment for MRSA infections.     Today   Subjective    [name] [name] today has no headache,no chest abdominal pain,no new weakness tingling or numbness, feels much better wants to go home today.    Objective   Blood pressure 132/73, pulse (!) 58, temperature 98 F (36.7 C), temperature source Oral, resp. rate 19, height 6\' 4"  (1.93 m), weight 100.4 kg (221 lb 5.5 oz), SpO2 96 %.   Intake/Output Summary (Last 24 hours) at 09/24/15 0850 Last data filed at 09/24/15 0600  Gross per 24 hour  Intake              720 ml  Output             2000 ml  Net            -1280 ml    Exam Awake Alert, Oriented x 3, No new F.N deficits, Normal affect [place].AT,PERRAL Supple Neck,No JVD, No cervical lymphadenopathy appriciated.  Symmetrical  Chest wall movement, Good air movement bilaterally, CTAB RRR,No Gallops,Rubs or new Murmurs, No Parasternal Heave +ve B.Sounds, Abd Soft, Non tender, No organomegaly appriciated, No rebound -guarding or rigidity. No Cyanosis, Clubbing or edema, No new Rash or bruise   Data Review   CBC w Diff: Lab Results  Component Value Date   WBC 5.8 09/24/2015   HGB 10.1 (L) 09/24/2015   HCT 31.1 (L) 09/24/2015   PLT 160 09/24/2015   LYMPHOPCT 22 09/23/2015   MONOPCT 12 09/23/2015   EOSPCT 2 09/23/2015   BASOPCT 1 09/23/2015    CMP: Lab Results  Component Value Date   NA 132 (L) 09/24/2015   K 3.6 09/24/2015   CL 92 (L) 09/24/2015   CO2 28 09/24/2015   BUN 31 (H) 09/24/2015   CREATININE 7.62 (H) 09/24/2015   PROT 8.4 (H) 09/23/2015   ALBUMIN 3.3 (L) 09/24/2015   BILITOT 0.9 09/23/2015   ALKPHOS 72 09/23/2015   AST 28 09/23/2015   ALT 43 09/23/2015  .   Total Time in preparing paper work, data evaluation and todays exam - 35 minutes  Leroy Sea M.D on 09/24/2015 at 8:50 AM  [facility]   Office  [phone]

## 2015-09-24 NOTE — Discharge Instructions (Signed)
Follow with Primary MD Katy ApoPOLITE,RONALD D, MD in 7 days   Get CBC, CMP, 2 view Chest X ray checked  by Primary MD or SNF MD in 5-7 days ( we routinely change or add medications that can affect your baseline labs and fluid status, therefore we recommend that you get the mentioned basic workup next visit with your PCP, your PCP may decide not to get them or add new tests based on their clinical decision)   Activity: As tolerated with Full fall precautions use walker/cane & assistance as needed   Disposition Home     Diet:  Renal Low Carb.  On your next visit with your primary care physician please Get Medicines reviewed and adjusted.   Please request your Prim.MD to go over all Hospital Tests and Procedure/Radiological results at the follow up, please get all Hospital records sent to your Prim MD by signing hospital release before you go home.   If you experience worsening of your admission symptoms, develop shortness of breath, life threatening emergency, suicidal or homicidal thoughts you must seek medical attention immediately by calling 911 or calling your MD immediately  if symptoms less severe.  You Must read complete instructions/literature along with all the possible adverse reactions/side effects for all the Medicines you take and that have been prescribed to you. Take any new Medicines after you have completely understood and accpet all the possible adverse reactions/side effects.   Do not drive, operate heavy machinery, perform activities at heights, swimming or participation in water activities or provide baby sitting services if your were admitted for syncope or siezures until you have seen by Primary MD or a Neurologist and advised to do so again.  Do not drive when taking Pain medications.    Do not take more than prescribed Pain, Sleep and Anxiety Medications  Special Instructions: If you have smoked or chewed Tobacco  in the last 2 yrs please stop smoking, stop any regular  Alcohol  and or any Recreational drug use.  Wear Seat belts while driving.   Please note  You were cared for by a hospitalist during your hospital stay. If you have any questions about your discharge medications or the care you received while you were in the hospital after you are discharged, you can call the unit and asked to speak with the hospitalist on call if the hospitalist that took care of you is not available. Once you are discharged, your primary care physician will handle any further medical issues. Please note that NO REFILLS for any discharge medications will be authorized once you are discharged, as it is imperative that you return to your primary care physician (or establish a relationship with a primary care physician if you do not have one) for your aftercare needs so that they can reassess your need for medications and monitor your lab values.

## 2015-09-24 NOTE — Procedures (Signed)
Regular schedule HD today, lowering dry wt further.  Suspect we can change dry wt to 97 kg approx.  For dc after HD today.     I was present at this dialysis session, have reviewed the session itself and made  appropriate changes Vinson Moselleob Lamin Chandley MD [facility] pager [phone]    cell [phone] 09/24/2015, 10:14 AM

## 2015-09-24 NOTE — Progress Notes (Signed)
Patient discharge teaching given, including activity, diet, follow-up appoints, and medications. Patient verbalized understanding of all discharge instructions. IV access was d/c'd. Vitals are stable. Skin is intact except as charted in most recent assessments. Pt to be escorted out by NT, to be driven home by family.  Josie Burleigh, MBA, BSN, RN 

## 2015-09-24 NOTE — Progress Notes (Signed)
[name] Progress Note   Subjective: "I'm doing all right, I think I had a touch of pneumonia...." Denies SOB. On HD tolerating well.   Objective Vitals:   09/24/15 0830 09/24/15 0845 09/24/15 0915 09/24/15 0945  BP: 133/79 132/73 110/88 128/74  Pulse: 63 (!) 58 (!) 58 (!) 58  Resp:      Temp:      TempSrc:      SpO2:      Weight:      Height:       Physical Exam General: Chronically ill appearing male, pleasant, NAD Heart: S1, S2, reg irreg 2/6 M No JVD Lungs: BBS few scattered expiratory wheezes Abdomen: soft non-tender, active BS X 4 quads Extremities: no LE edema Dialysis Access: LUA AVF cannulated at present  Dialysis Orders: [facility] MWF  4h  101.5kg   Hep 4200    Hect 3  Venofer 50  Mircera 75 q 2 last 8.9  Additional Objective Labs: Basic Metabolic Panel:  Recent Labs Lab 09/22/15 0833 09/23/15 1904 09/24/15 0745  NA 133* 131* 132*  K 4.2 3.3* 3.6  CL 90* 92* 92*  CO2 29 27 28   GLUCOSE 263* 239* 203*  BUN 48* 23* 31*  CREATININE 10.68* 6.20* 7.62*  CALCIUM 8.6* 8.9 8.9  PHOS  --   --  3.6   Liver Function Tests:  Recent Labs Lab 09/23/15 1904 09/24/15 0745  AST 28  --   ALT 43  --   ALKPHOS 72  --   BILITOT 0.9  --   PROT 8.4*  --   ALBUMIN 3.4* 3.3*   CBC:  Recent Labs Lab 09/22/15 0833 09/23/15 1904 09/24/15 0745  WBC 8.5 5.5 5.8  NEUTROABS 6.8 3.5  --   HGB 11.0* 11.3* 10.1*  HCT 34.0* 34.7* 31.1*  MCV 91.9 90.6 90.1  PLT 177 200 160   Blood Culture    Component Value Date/Time   SDES BLOOD RIGHT ANTECUBITAL 04/20/2015 1222   SPECREQUEST BOTTLES DRAWN AEROBIC AND ANAEROBIC 5CC 04/20/2015 1222   CULT NO GROWTH 5 DAYS 04/20/2015 1222   REPTSTATUS 04/26/2015 FINAL 04/20/2015 1222    Cardiac Enzymes: No results for input(s): CKTOTAL, CKMB, CKMBINDEX, TROPONINI in the last 168 hours. CBG:  Recent Labs Lab 09/22/15 1652 09/22/15 1946 09/23/15 0729 09/23/15 1709 09/23/15 2001  GLUCAP 138* 190* 189* 201*  178*    Studies/Results: Dg Chest 2 View  Result Date: 09/23/2015 CLINICAL DATA:  Chronic cough, history of atrial flutter, diabetes, end-stage renal disease, suspect fluid overload. EXAM: CHEST  2 VIEW COMPARISON:  PA and lateral chest x-ray of September 22, 2015 FINDINGS: The lungs are slightly less well inflated today. The interstitial markings remain increased. The cardiac silhouette remains enlarged. The central pulmonary vascularity is engorged but stable. No significant pleural effusion is observed. The bony thorax is unremarkable. IMPRESSION: Mild pulmonary interstitial edema secondary to CHF which has improved since yesterday's study. Electronically Signed   By: David  SwazilandJordan M.D.   On: 09/23/2015 07:21   Medications: . sodium chloride     . atorvastatin  10 mg Oral q1800  . atropine  1 drop Left Eye q12n4p  . B-complex with vitamin C  1 tablet Oral Daily  . calcium acetate  1,334 mg Oral TID WC  . cinacalcet  60 mg Oral Q supper  . darbepoetin (ARANESP) injection - DIALYSIS  25 mcg Intravenous Q Wed-HD  . diltiazem  360 mg Oral Daily  . dorzolamide-timolol  1  drop Right Eye BID  . doxercalciferol  3 mcg Intravenous Q M,W,F-HD  . ferric gluconate (FERRLECIT/NULECIT) IV  62.5 mg Intravenous Q Wed-HD  . [START ON 09/25/2015] heparin  4,200 Units Dialysis Once in dialysis  . insulin aspart  0-9 Units Subcutaneous TID WC  . latanoprost  1 drop Right Eye QHS  . metoprolol  50 mg Oral BID  . prednisoLONE acetate  1 drop Left Eye BID  . sodium chloride flush  3 mL Intravenous Q12H  . tobramycin-dexamethasone  1 drop Left Eye QID  . warfarin  7.5 mg Oral q1800  . Warfarin - Pharmacist Dosing Inpatient   Does not apply q1800     Assessment/Plan: 1. Acute on Chronic Diastolic HF/Volume Overload: resolving with HD. DC home today.  2. ESRD -MWF [facility]. On HD today. K+ 3.6 4.0 K bath.  3. Anemia - HGB 10.1 recently resumed OP ESA.  4. Secondary hyperparathyroidism - cont binders/VDRA Phos  3.6 Ca 8.9 C Ca 9.46 5. HTN/volume - On HD today, UFG 3000. Nadir post wt 100.8 kg, pre wt 100.4 kg today. Lower EDW on DC.  6. Nutrition -Albumin 3.3 Renal diet/fld restrictions. Nepro.  7. Chronic AFib: on coumadin, beta blocker, dilt. Per primary-[facility] following coumadin. 8. DM: per primary   Disposition: For DC home today.   Rita H. Brown NP-C 09/24/2015, 10:11 AM  [facility] [phone]  Pt seen, examined and agree w A/P as above.  Vinson Moselle MD [employer] pager [phone]    cell [phone] 09/24/2015, 4:35 PM

## 2015-09-28 ENCOUNTER — Telehealth: Payer: Self-pay | Admitting: *Deleted

## 2015-09-28 ENCOUNTER — Ambulatory Visit: Payer: Medicare Other | Admitting: Nurse Practitioner

## 2015-09-28 ENCOUNTER — Ambulatory Visit (INDEPENDENT_AMBULATORY_CARE_PROVIDER_SITE_OTHER): Payer: Medicare Other | Admitting: Interventional Cardiology

## 2015-09-28 DIAGNOSIS — I484 Atypical atrial flutter: Secondary | ICD-10-CM

## 2015-09-28 DIAGNOSIS — I4891 Unspecified atrial fibrillation: Secondary | ICD-10-CM

## 2015-09-28 DIAGNOSIS — Z5181 Encounter for therapeutic drug level monitoring: Secondary | ICD-10-CM

## 2015-09-28 LAB — PROTIME-INR: INR: 3.4 — AB (ref 0.9–1.1)

## 2015-09-28 NOTE — Progress Notes (Deleted)
CARDIOLOGY OFFICE NOTE  Date:  09/28/2015    [name] [name] Date of Birth: 10-16-1933 Medical Record #[id number]  PCP:  [name] Apo, MD  Cardiologist:  [name] [name]    No chief complaint on file.   History of Present Illness: [name] is a 82 y.o. male who presents today for a follow up visit. Seen for Dr. Katrinka Blazing.   He has a past medical history of ESRD, multiple episodes of Afib with RVR, HTN, DM. He has previously been on amiodarone and has a hx of ablation for PSVT. Chadvasc score 5. No mention of CAD noted.   In July of 2017, he was transferred by [name] after pt presented with elevated HR in the 120s. He was started on IV dilt and rate decreased to 100 and was in a fib at that time. His INR was therapeutic on his home dose of coumadin.   Notes reviewed mention that he has had a number of recent hospitalizations for AFib RVR converting to SR with amiodarone. Cardiology H&P notes repeat admissions for Afib with RVR. 3/21-3/24/17 (in the setting of pneumonia and hypoxia) and 04/12/15- 04/17/15 though this hospital stay his presenting rhythm was SR during the stay had RVR. Also 04/07/15 to 04/08/15 for a fib with RVR with c/o dizziness- given IV amiodarone and then home dose increased to 200 daily and pt converted to SR. He has also been seen to have AFlutter.  During hospitalization Dr. Katrinka Blazing asked for EP consult and Dr. Ladona [name] saw the pt.  Dr. Ladona [name] recommended just rate control, stop amiodarone, increase the beta blocker and that he was not a good candidate for PPM and hope to avoid PPM implant.   It is noted that he has been on both amlodipine and Diltiazem - providers are aware. His statin was changed to Lipitor.   I saw him after his last hospitalization - was not sure what he was taking in regards to his medicines. On 2 CCB's. He is to be managed with rate control and continued anticoagulation in regards to his AF. No longer on amiodarone.   Comes in  today. Here with   Past Medical History:  Diagnosis Date  . Anemia   . Atrial flutter (HCC)   . Bradycardia    a. unable to tolerate beta blockers in the past.  . Diabetes mellitus (HCC)   . ESRD (end stage renal disease) on dialysis [name])    "MWF; [address]" (04/08/2015)  . Glaucoma   . Gout   . Hypertension   . PAF (paroxysmal atrial fibrillation) (HCC)   . Peripheral neuropathy (HCC)   . PSVT (paroxysmal supraventricular tachycardia) (HCC)    a. h/o ablation.    Past Surgical History:  Procedure Laterality Date  . ABLATION OF DYSRHYTHMIC FOCUS    . SP AV DIALYSIS SHUNT ACCESS EXISTING *L*       Medications: Current Outpatient Prescriptions  Medication Sig Dispense Refill  . acetaminophen (TYLENOL) 325 MG tablet Take 650 mg by mouth daily as needed for mild pain.    Marland Kitchen amLODipine (NORVASC) 10 MG tablet Take 10 mg by mouth daily.    Marland Kitchen atorvastatin (LIPITOR) 10 MG tablet Take 1 tablet (10 mg total) by mouth daily at 6 PM. 30 tablet 6  . atropine 1 % ophthalmic solution Place 1 drop into the left eye 2 times daily at 12 noon and 4 pm.    . Calcium Acetate 667 MG TABS Take  1-2 capsules by mouth 3 (three) times daily. 667 mg for snacks, 1334 mg for meals    . diltiazem (CARDIZEM CD) 360 MG 24 hr capsule Take 1 capsule (360 mg total) by mouth daily. 30 capsule 6  . docusate sodium (COLACE) 100 MG capsule Take 100 mg by mouth 2 (two) times daily.    . dorzolamide-timolol (COSOPT) 22.3-6.8 MG/ML ophthalmic solution Place 1 drop into the right eye 2 (two) times daily.     . folic acid-vitamin b complex-vitamin c-selenium-zinc (DIALYVITE) 3 MG TABS tablet Take 1 tablet by mouth daily.    Marland Kitchen lidocaine-prilocaine (EMLA) cream Apply 1 application topically as needed (apply to skin near dialysis port before dialysis).     . metoprolol (LOPRESSOR) 50 MG tablet Take 1 tablet (50 mg total) by mouth 2 (two) times daily. 60 tablet 6  . Multiple Vitamin (MULTIVITAMIN WITH MINERALS) TABS  tablet Take 1 tablet by mouth daily. 30 tablet 0  . prednisoLONE acetate (PRED FORTE) 1 % ophthalmic suspension Place 1 drop into the left eye 2 (two) times daily. Patient to use for 37 days. Filled on 03-28-15    . SENSIPAR 60 MG tablet Take 60 mg by mouth at bedtime.     Marland Kitchen tobramycin-dexamethasone (TOBRADEX) ophthalmic solution Place 1 drop into the left eye 4 (four) times daily. For 37 days. Filled 03-31-15    . TRAVATAN Z 0.004 % SOLN ophthalmic solution Place 1 drop into the right eye at bedtime.     Marland Kitchen warfarin (COUMADIN) 5 MG tablet Take as directed by [facility] (Patient taking differently: Take 7.5 mg by mouth See admin instructions. 7.5 mg daily) 50 tablet 3   No current facility-administered medications for this visit.     Allergies: Allergies  Allergen Reactions  . Diltiazem Other (See Comments)    SEVERE STOMACH CRAMPING PER PT    Social History: The patient  reports that he has never smoked. He has never used smokeless tobacco. He reports that he does not drink alcohol or use drugs.   Family History: The patient's family history includes Alcoholism in his brother and brother; Heart failure in his father; Other in his mother.   Review of Systems: Please see the history of present illness.   Otherwise, the review of systems is positive for none.   All other systems are reviewed and negative.   Physical Exam: VS:  There were no vitals taken for this visit. Marland Kitchen  BMI There is no height or weight on file to calculate BMI.  Wt Readings from Last 3 Encounters:  09/24/15 214 lb 11.7 oz (97.4 kg)  08/31/15 229 lb 6.4 oz (104.1 kg)  08/15/15 227 lb 3.2 oz (103.1 kg)    General: Pleasant. Well developed, well nourished and in no acute distress.   HEENT: Normal.  Neck: Supple, no JVD, carotid bruits, or masses noted.  Cardiac: Regular rate and rhythm. No murmurs, rubs, or gallops. No edema.  Respiratory:  Lungs are clear to auscultation bilaterally with normal work of  breathing.  GI: Soft and nontender.  MS: No deformity or atrophy. Gait and ROM intact.  Skin: Warm and dry. Color is normal.  Neuro:  Strength and sensation are intact and no gross focal deficits noted.  Psych: Alert, appropriate and with normal affect.   LABORATORY DATA:  EKG:  EKG is ordered today. This demonstrates .  Lab Results  Component Value Date   WBC 5.8 09/24/2015   HGB 10.1 (L) 09/24/2015  HCT 31.1 (L) 09/24/2015   PLT 160 09/24/2015   GLUCOSE 203 (H) 09/24/2015   ALT 43 09/23/2015   AST 28 09/23/2015   NA 132 (L) 09/24/2015   K 3.6 09/24/2015   CL 92 (L) 09/24/2015   CREATININE 7.62 (H) 09/24/2015   BUN 31 (H) 09/24/2015   CO2 28 09/24/2015   TSH 3.298 04/07/2015   INR 3.07 09/24/2015   HGBA1C 8.8 (H) 09/22/2015    BNP (last 3 results)  Recent Labs  08/12/15 2143 09/22/15 0833  BNP 276.9* 568.9*    ProBNP (last 3 results) No results for input(s): PROBNP in the last 8760 hours.   Other Studies Reviewed Today:  Procedures: ECHO 08/14/15 Study Conclusions  - Left ventricle: The cavity size was normal. Wall thickness was  increased in a pattern of mild LVH. Systolic function was normal.  The estimated ejection fraction was in the range of 60% to 65%.  Indeterminant diastolic function (atrial fibrillation). Wall  motion was normal; there were no regional wall motion  abnormalities. - Aortic valve: Trileaflet; moderately calcified leaflets.  Transvalvular velocity was minimally increased. There was mild  stenosis. There was trivial regurgitation. Mean gradient (S): 11  mm Hg. Valve area (VTI): 1.51 cm^2. Valve area (Vmax): 1.57 cm^2.  Valve area (Vmean): 1.54 cm^2. - Mitral valve: Mildly calcified annulus. There was trivial  regurgitation. - Left atrium: The atrium was moderately dilated. - Right ventricle: The cavity size was normal. Systolic function  was normal. - Right atrium: The atrium was moderately dilated. - Pulmonary  arteries: No complete TR doppler jet so unable to  estimate PA systolic pressure. - Inferior vena cava: The vessel was normal in size. The  respirophasic diameter changes were in the normal range (>= 50%),  consistent with normal central venous pressure.  Impressions:  - Normal LV size with mild LV hypertrophy. EF 60-65%. Normal RV  size and systolic function. Mild aortic stenosis. Moderate  biatrial enlargement.  Assessment/Plan: 1. Multiple episodes of AF/flutter - now to manage with rate control - no plans to restore NSR - yet he was in NSR at last visit earlier this month.  No longer on amiodarone.  He has apparently per Dr. Katrinka BlazingSmith has had a tendency to be bradycardic - hopefully we will be able to avoid. Dr. Ladona Ridgelaylor has wanted to avoid PPM implant.   2. Chronic anticoagulation -   3. ESRD - on dialysis  4. HLD - now on Lipitor - recheck his labs   5. HTN -   Current medicines are reviewed with the patient today.  The patient does not have concerns regarding medicines other than what has been noted above.  The following changes have been made:  See above.  Labs/ tests ordered today include:   No orders of the defined types were placed in this encounter.    Disposition:   FU with .   Patient is agreeable to this plan and will call if any problems develop in the interim.   Signed: Rosalio MacadamiaLori C. Latroya Ng, RN, ANP-C 09/28/2015 8:07 AM  [facility] [address] [place], [place]  [address] Phone: [phone] Fax: [phone]

## 2015-09-28 NOTE — Telephone Encounter (Signed)
S/w pt could not make appointment today due to dialysis. Rescheduled per Norma FredricksonLori Gerhardt, NP,for middle of September.

## 2015-09-30 ENCOUNTER — Ambulatory Visit (INDEPENDENT_AMBULATORY_CARE_PROVIDER_SITE_OTHER): Payer: Medicare Other | Admitting: Internal Medicine

## 2015-09-30 DIAGNOSIS — I4891 Unspecified atrial fibrillation: Secondary | ICD-10-CM

## 2015-09-30 DIAGNOSIS — I484 Atypical atrial flutter: Secondary | ICD-10-CM

## 2015-09-30 DIAGNOSIS — Z5181 Encounter for therapeutic drug level monitoring: Secondary | ICD-10-CM

## 2015-09-30 LAB — POCT INR: INR: 2.1

## 2015-10-04 ENCOUNTER — Emergency Department (HOSPITAL_COMMUNITY): Payer: Medicare Other

## 2015-10-04 ENCOUNTER — Encounter (HOSPITAL_COMMUNITY): Payer: Self-pay | Admitting: Emergency Medicine

## 2015-10-04 ENCOUNTER — Inpatient Hospital Stay (HOSPITAL_COMMUNITY)
Admission: EM | Admit: 2015-10-04 | Discharge: 2015-10-08 | DRG: 291 | Disposition: A | Discharge status: Skilled Nursing Facility | Payer: Medicare Other | Attending: Internal Medicine | Admitting: Internal Medicine

## 2015-10-04 DIAGNOSIS — R531 Weakness: Secondary | ICD-10-CM | POA: Diagnosis not present

## 2015-10-04 DIAGNOSIS — E785 Hyperlipidemia, unspecified: Secondary | ICD-10-CM | POA: Diagnosis not present

## 2015-10-04 DIAGNOSIS — R269 Unspecified abnormalities of gait and mobility: Secondary | ICD-10-CM

## 2015-10-04 DIAGNOSIS — R5381 Other malaise: Secondary | ICD-10-CM | POA: Diagnosis not present

## 2015-10-04 DIAGNOSIS — M109 Gout, unspecified: Secondary | ICD-10-CM | POA: Diagnosis not present

## 2015-10-04 DIAGNOSIS — D638 Anemia in other chronic diseases classified elsewhere: Secondary | ICD-10-CM | POA: Diagnosis not present

## 2015-10-04 DIAGNOSIS — H5442 Blindness, left eye, normal vision right eye: Secondary | ICD-10-CM | POA: Diagnosis present

## 2015-10-04 DIAGNOSIS — Z66 Do not resuscitate: Secondary | ICD-10-CM | POA: Diagnosis present

## 2015-10-04 DIAGNOSIS — I132 Hypertensive heart and chronic kidney disease with heart failure and with stage 5 chronic kidney disease, or end stage renal disease: Principal | ICD-10-CM | POA: Diagnosis present

## 2015-10-04 DIAGNOSIS — Z7901 Long term (current) use of anticoagulants: Secondary | ICD-10-CM

## 2015-10-04 DIAGNOSIS — I10 Essential (primary) hypertension: Secondary | ICD-10-CM | POA: Diagnosis not present

## 2015-10-04 DIAGNOSIS — E1142 Type 2 diabetes mellitus with diabetic polyneuropathy: Secondary | ICD-10-CM | POA: Diagnosis present

## 2015-10-04 DIAGNOSIS — I480 Paroxysmal atrial fibrillation: Secondary | ICD-10-CM | POA: Diagnosis not present

## 2015-10-04 DIAGNOSIS — E278 Other specified disorders of adrenal gland: Secondary | ICD-10-CM | POA: Diagnosis present

## 2015-10-04 DIAGNOSIS — H409 Unspecified glaucoma: Secondary | ICD-10-CM | POA: Diagnosis present

## 2015-10-04 DIAGNOSIS — N186 End stage renal disease: Secondary | ICD-10-CM | POA: Diagnosis present

## 2015-10-04 DIAGNOSIS — R5383 Other fatigue: Secondary | ICD-10-CM | POA: Diagnosis present

## 2015-10-04 DIAGNOSIS — I482 Chronic atrial fibrillation: Secondary | ICD-10-CM | POA: Diagnosis not present

## 2015-10-04 DIAGNOSIS — Z992 Dependence on renal dialysis: Secondary | ICD-10-CM

## 2015-10-04 DIAGNOSIS — E1122 Type 2 diabetes mellitus with diabetic chronic kidney disease: Secondary | ICD-10-CM | POA: Diagnosis present

## 2015-10-04 DIAGNOSIS — Z7189 Other specified counseling: Secondary | ICD-10-CM

## 2015-10-04 DIAGNOSIS — Z515 Encounter for palliative care: Secondary | ICD-10-CM

## 2015-10-04 DIAGNOSIS — R29898 Other symptoms and signs involving the musculoskeletal system: Secondary | ICD-10-CM

## 2015-10-04 LAB — COMPREHENSIVE METABOLIC PANEL
ALT: 18 U/L (ref 17–63)
ANION GAP: 15 (ref 5–15)
AST: 24 U/L (ref 15–41)
Albumin: 3.6 g/dL (ref 3.5–5.0)
Alkaline Phosphatase: 60 U/L (ref 38–126)
BUN: 64 mg/dL — ABNORMAL HIGH (ref 6–20)
CHLORIDE: 88 mmol/L — AB (ref 101–111)
CO2: 26 mmol/L (ref 22–32)
Calcium: 9.8 mg/dL (ref 8.9–10.3)
Creatinine, Ser: 12.63 mg/dL — ABNORMAL HIGH (ref 0.61–1.24)
GFR calc non Af Amer: 3 mL/min — ABNORMAL LOW (ref >60)
GFR, EST AFRICAN AMERICAN: 4 mL/min — AB (ref >60)
Glucose, Bld: 228 mg/dL — ABNORMAL HIGH (ref 65–99)
POTASSIUM: 4.7 mmol/L (ref 3.5–5.1)
SODIUM: 129 mmol/L — AB (ref 135–145)
Total Bilirubin: 0.6 mg/dL (ref 0.3–1.2)
Total Protein: 7.7 g/dL (ref 6.5–8.1)

## 2015-10-04 LAB — CBC WITH DIFFERENTIAL/PLATELET
BASOS PCT: 0 %
Basophils Absolute: 0 10*3/uL (ref 0.0–0.1)
Eosinophils Absolute: 0.1 10*3/uL (ref 0.0–0.7)
Eosinophils Relative: 1 %
HEMATOCRIT: 33.2 % — AB (ref 39.0–52.0)
HEMOGLOBIN: 11.5 g/dL — AB (ref 13.0–17.0)
LYMPHS ABS: 1.3 10*3/uL (ref 0.7–4.0)
Lymphocytes Relative: 19 %
MCH: 30.6 pg (ref 26.0–34.0)
MCHC: 34.6 g/dL (ref 30.0–36.0)
MCV: 88.3 fL (ref 78.0–100.0)
MONOS PCT: 9 %
Monocytes Absolute: 0.6 10*3/uL (ref 0.1–1.0)
NEUTROS ABS: 5 10*3/uL (ref 1.7–7.7)
NEUTROS PCT: 71 %
Platelets: 206 10*3/uL (ref 150–400)
RBC: 3.76 MIL/uL — AB (ref 4.22–5.81)
RDW: 15.9 % — ABNORMAL HIGH (ref 11.5–15.5)
WBC: 7.1 10*3/uL (ref 4.0–10.5)

## 2015-10-04 LAB — PREALBUMIN: Prealbumin: 33.5 mg/dL (ref 18–38)

## 2015-10-04 LAB — CREATININE, SERUM
Creatinine, Ser: 13.35 mg/dL — ABNORMAL HIGH (ref 0.61–1.24)
GFR, EST AFRICAN AMERICAN: 3 mL/min — AB (ref >60)
GFR, EST NON AFRICAN AMERICAN: 3 mL/min — AB (ref >60)

## 2015-10-04 LAB — PROTIME-INR
INR: 2.73
PROTHROMBIN TIME: 29.4 s — AB (ref 11.4–15.2)

## 2015-10-04 LAB — AMMONIA: AMMONIA: 28 umol/L (ref 9–35)

## 2015-10-04 LAB — MAGNESIUM: Magnesium: 2.8 mg/dL — ABNORMAL HIGH (ref 1.7–2.4)

## 2015-10-04 LAB — GLUCOSE, CAPILLARY
Glucose-Capillary: 198 mg/dL — ABNORMAL HIGH (ref 65–99)
Glucose-Capillary: 257 mg/dL — ABNORMAL HIGH (ref 65–99)

## 2015-10-04 LAB — POC OCCULT BLOOD, ED: FECAL OCCULT BLD: NEGATIVE

## 2015-10-04 MED ORDER — PREDNISOLONE ACETATE 1 % OP SUSP
1.0000 [drp] | Freq: Two times a day (BID) | OPHTHALMIC | Status: DC
  Administered 2015-10-05 – 2015-10-07 (×6): 1 [drp] via OPHTHALMIC
  Filled 2015-10-04: qty 1

## 2015-10-04 MED ORDER — ALPRAZOLAM 0.5 MG PO TABS: 0.5000 mg | ORAL_TABLET | Freq: Once | ORAL | Status: DC | PRN

## 2015-10-04 MED ORDER — METOPROLOL TARTRATE 50 MG PO TABS
50.0000 mg | ORAL_TABLET | Freq: Two times a day (BID) | ORAL | Status: DC
  Administered 2015-10-04: 50 mg via ORAL
  Filled 2015-10-04: qty 1

## 2015-10-04 MED ORDER — HEPARIN SODIUM (PORCINE) 5000 UNIT/ML IJ SOLN: 5000.0000 [IU] | Freq: Three times a day (TID) | INTRAMUSCULAR | Status: DC

## 2015-10-04 MED ORDER — ACETAMINOPHEN 325 MG PO TABS: 650.0000 mg | ORAL_TABLET | Freq: Four times a day (QID) | ORAL | Status: DC | PRN

## 2015-10-04 MED ORDER — ATORVASTATIN CALCIUM 10 MG PO TABS
10.0000 mg | ORAL_TABLET | Freq: Every day | ORAL | Status: DC
  Administered 2015-10-04 – 2015-10-07 (×4): 10 mg via ORAL
  Filled 2015-10-04 (×4): qty 1

## 2015-10-04 MED ORDER — DILTIAZEM HCL ER COATED BEADS 180 MG PO CP24
360.0000 mg | ORAL_CAPSULE | Freq: Every day | ORAL | Status: DC
  Administered 2015-10-04: 360 mg via ORAL
  Filled 2015-10-04: qty 2

## 2015-10-04 MED ORDER — CALCIUM ACETATE (PHOS BINDER) 667 MG PO CAPS
667.0000 mg | ORAL_CAPSULE | Freq: Three times a day (TID) | ORAL | Status: DC
  Administered 2015-10-05: 667 mg via ORAL
  Filled 2015-10-04: qty 1

## 2015-10-04 MED ORDER — INSULIN ASPART 100 UNIT/ML ~~LOC~~ SOLN
0.0000 [IU] | Freq: Three times a day (TID) | SUBCUTANEOUS | Status: DC
  Administered 2015-10-05 (×2): 2 [IU] via SUBCUTANEOUS
  Administered 2015-10-05: 3 [IU] via SUBCUTANEOUS
  Administered 2015-10-06 (×2): 2 [IU] via SUBCUTANEOUS
  Administered 2015-10-07: 5 [IU] via SUBCUTANEOUS
  Administered 2015-10-07: 2 [IU] via SUBCUTANEOUS

## 2015-10-04 MED ORDER — WARFARIN SODIUM 7.5 MG PO TABS
7.5000 mg | ORAL_TABLET | Freq: Once | ORAL | Status: AC
  Administered 2015-10-04: 7.5 mg via ORAL
  Filled 2015-10-04: qty 1

## 2015-10-04 MED ORDER — NEOMYCIN-POLYMYXIN-DEXAMETH 3.5-10000-0.1 OP SUSP
1.0000 [drp] | Freq: Four times a day (QID) | OPHTHALMIC | Status: DC
  Administered 2015-10-04 – 2015-10-07 (×10): 1 [drp] via OPHTHALMIC
  Filled 2015-10-04: qty 5

## 2015-10-04 MED ORDER — CALCIUM ACETATE 667 MG PO TABS: 1.0000 | ORAL_TABLET | Freq: Three times a day (TID) | ORAL | Status: DC

## 2015-10-04 MED ORDER — OXYCODONE HCL 5 MG PO TABS
5.0000 mg | ORAL_TABLET | ORAL | Status: DC | PRN
  Administered 2015-10-04: 5 mg via ORAL
  Filled 2015-10-04: qty 1

## 2015-10-04 MED ORDER — ACETAMINOPHEN 650 MG RE SUPP: 650.0000 mg | Freq: Four times a day (QID) | RECTAL | Status: DC | PRN

## 2015-10-04 MED ORDER — RENA-VITE PO TABS
1.0000 | ORAL_TABLET | Freq: Every day | ORAL | Status: DC
  Administered 2015-10-04 – 2015-10-07 (×4): 1 via ORAL
  Filled 2015-10-04 (×4): qty 1

## 2015-10-04 MED ORDER — LIDOCAINE-PRILOCAINE 2.5-2.5 % EX CREA: 1.0000 "application " | TOPICAL_CREAM | CUTANEOUS | Status: DC | PRN

## 2015-10-04 MED ORDER — NEPRO/CARBSTEADY PO LIQD
237.0000 mL | Freq: Two times a day (BID) | ORAL | Status: DC
  Administered 2015-10-05 – 2015-10-07 (×3): 237 mL via ORAL

## 2015-10-04 MED ORDER — DEXAMETHASONE 0.5 MG PO TABS
1.0000 mg | ORAL_TABLET | Freq: Once | ORAL | Status: AC
  Administered 2015-10-04: 1 mg via ORAL
  Filled 2015-10-04: qty 2

## 2015-10-04 MED ORDER — WARFARIN - PHARMACIST DOSING INPATIENT
Freq: Every day | Status: DC
  Administered 2015-10-06: 18:00:00

## 2015-10-04 MED ORDER — INSULIN ASPART 100 UNIT/ML ~~LOC~~ SOLN
0.0000 [IU] | Freq: Every day | SUBCUTANEOUS | Status: DC
  Administered 2015-10-04: 3 [IU] via SUBCUTANEOUS
  Administered 2015-10-05 – 2015-10-06 (×2): 4 [IU] via SUBCUTANEOUS
  Administered 2015-10-07: 2 [IU] via SUBCUTANEOUS

## 2015-10-04 MED ORDER — LATANOPROST 0.005 % OP SOLN
1.0000 [drp] | Freq: Every day | OPHTHALMIC | Status: DC
  Administered 2015-10-05 – 2015-10-07 (×3): 1 [drp] via OPHTHALMIC
  Filled 2015-10-04: qty 2.5

## 2015-10-04 MED ORDER — PROMETHAZINE HCL 25 MG PO TABS
12.5000 mg | ORAL_TABLET | Freq: Four times a day (QID) | ORAL | Status: DC | PRN
  Filled 2015-10-04: qty 1

## 2015-10-04 MED ORDER — CALCIUM ACETATE (PHOS BINDER) 667 MG PO CAPS: 1334.0000 mg | ORAL_CAPSULE | Freq: Three times a day (TID) | ORAL | Status: DC | PRN

## 2015-10-04 MED ORDER — CINACALCET HCL 30 MG PO TABS
60.0000 mg | ORAL_TABLET | Freq: Every day | ORAL | Status: DC
  Administered 2015-10-04 – 2015-10-07 (×4): 60 mg via ORAL
  Filled 2015-10-04 (×4): qty 2

## 2015-10-04 NOTE — H&P (Signed)
History and Physical    BIJON MINEER WUJ:[id number] DOB: 05-25-1933 DOA: 10/04/2015  PCP: Katy Apo, MD Patient coming from: home  Chief Complaint: genaralized weakness and inability to walk  HPI: [name] [name] is a 82 y.o. male with medical history significant of anemia, atrial flutter, diabetes, ESRD on dialysis Monday was a Friday, glaucoma, gout, HTN  Inability to walk and general weakness. Progressive over the last several months with acute worsening over the last couple of days. Level V caveat applies this patient is an extremely poor historian and there are no family present to be of assist with information gathering.  No focal complaints other than slight tremor outside of what is mentioned above. Denies any fevers, cough, back pain, nausea, vomiting, diarrhea, neck stiffness, headache. Was not available for consultation in the ED but was finally present around 1600 on the floor. Multiple times were made to try and reach patient's wife including having GDP go to family's home. .  ED Course: objective findings outlined below. Pt stable for discharge but unsafe for discharge due to no family being available where pt lives.   Review of Systems: As per HPI otherwise 10 point review of systems negative.   Ambulatory Status:bed bound  Past Medical History:  Diagnosis Date  . Anemia   . Atrial flutter (HCC)   . Bradycardia    a. unable to tolerate beta blockers in the past.  . Diabetes mellitus (HCC)   . ESRD (end stage renal disease) on dialysis [facility])    "MWF; [address]" (04/08/2015)  . Glaucoma   . Gout   . Hypertension   . PAF (paroxysmal atrial fibrillation) (HCC)   . Peripheral neuropathy (HCC)   . PSVT (paroxysmal supraventricular tachycardia) (HCC)    a. h/o ablation.    Past Surgical History:  Procedure Laterality Date  . ABLATION OF DYSRHYTHMIC FOCUS    . SP AV DIALYSIS SHUNT ACCESS EXISTING *L*      Social History   Social History  . Marital  status: Married    Spouse name: N/A  . Number of children: N/A  . Years of education: N/A   Occupational History  . Not on file.   Social History Main Topics  . Smoking status: Never Smoker  . Smokeless tobacco: Never Used  . Alcohol use No  . Drug use: No  . Sexual activity: Not on file   Other Topics Concern  . Not on file   Social History Narrative  . No narrative on file    Allergies  Allergen Reactions  . Diltiazem Other (See Comments)    SEVERE STOMACH CRAMPING PER PT    Family History  Problem Relation Age of Onset  . Other Mother     bowel obstruction  . Heart failure Father     fluid bluid up  . Alcoholism Brother   . Alcoholism Brother     Prior to Admission medications   Medication Sig Start Date End Date Taking? Authorizing Provider  B Complex-C-Folic Acid (RENA-VITE PO) Take 1 tablet by mouth daily.   Yes Historical Provider, MD  Calcium Acetate 667 MG TABS Take 1-2 capsules by mouth 3 (three) times daily. 667 mg for snacks, 1334 mg for meals   Yes Historical Provider, MD  diltiazem (CARDIZEM CD) 360 MG 24 hr capsule Take 1 capsule (360 mg total) by mouth daily. 08/31/15  Yes Rosalio Macadamia, NP  docusate sodium (COLACE) 100 MG capsule Take 100 mg by  mouth 2 (two) times daily as needed for mild constipation or moderate constipation.    Yes Historical Provider, MD  dorzolamide-timolol (COSOPT) 22.3-6.8 MG/ML ophthalmic solution Place 1 drop into the right eye 2 (two) times daily.    Yes Historical Provider, MD  lidocaine-prilocaine (EMLA) cream Apply 1 application topically as needed (apply to skin near dialysis port before dialysis).  08/31/14  Yes Historical Provider, MD  metoprolol (LOPRESSOR) 50 MG tablet Take 1 tablet (50 mg total) by mouth 2 (two) times daily. 08/15/15  Yes Leone Brand, NP  neomycin-polymyxin b-dexamethasone (MAXITROL) 3.5-10000-0.1 SUSP Place 1 drop into the left eye 4 (four) times daily. 09/28/15  Yes Historical Provider, MD    prednisoLONE acetate (PRED FORTE) 1 % ophthalmic suspension Place 1 drop into the left eye 2 (two) times daily. Patient to use for 37 days. Filled on 03-28-15   Yes Historical Provider, MD  SENSIPAR 60 MG tablet Take 60 mg by mouth at bedtime.  09/03/14  Yes Historical Provider, MD  tobramycin-dexamethasone Wallene Dales) ophthalmic solution Place 1 drop into the left eye 4 (four) times daily. For 37 days. Filled 03-31-15   Yes Historical Provider, MD  TRAVATAN Z 0.004 % SOLN ophthalmic solution Place 1 drop into the right eye at bedtime.  08/30/14  Yes Historical Provider, MD  warfarin (COUMADIN) 5 MG tablet Take as directed by [facility] Patient taking differently: Take 7.5 mg by mouth See admin instructions. 7.5 mg daily 07/09/15  Yes Lyn Records, MD  acetaminophen (TYLENOL) 325 MG tablet Take 650 mg by mouth daily as needed for mild pain.    Historical Provider, MD  atorvastatin (LIPITOR) 10 MG tablet Take 1 tablet (10 mg total) by mouth daily at 6 PM. 08/15/15   Leone Brand, NP    Physical Exam: Vitals:   10/04/15 1352 10/04/15 1400 10/04/15 1424 10/04/15 1430  BP: 148/78 157/95 170/91   Pulse: 61 65 65 67  Resp: 17 18 16 20   Temp:      TempSrc:      SpO2: 100% 100% 100% 100%  Weight:      Height:         General:  Appears calm and comfortable Eyes:  L eye blind. nml lids ENT: dry mm, poor dentition Neck:  no LAD, masses or thyromegaly Cardiovascular: III/VI systolic murmur, RRR, No LE edema.  Respiratory:  CTA bilaterally, no w/r/r. Normal respiratory effort. Abdomen:  soft, ntnd, NABS Skin:  no rash or induration seen on limited exam Musculoskeletal: Globally weak. No bony upper maladies. Upper and lower extremity strength symmetrical Psychiatric:  grossly normal mood and affect, speech fluent and appropriate, Neurologic:  CN 2-12 grossly intact, moves all extremities in coordinated fashion, sensation intact  Labs on Admission: I have personally reviewed following labs and  imaging studies  CBC:  Recent Labs Lab 10/04/15 0846  WBC 7.1  NEUTROABS 5.0  HGB 11.5*  HCT 33.2*  MCV 88.3  PLT 206   Basic Metabolic Panel:  Recent Labs Lab 10/04/15 0846  NA 129*  K 4.7  CL 88*  CO2 26  GLUCOSE 228*  BUN 64*  CREATININE 12.63*  CALCIUM 9.8  MG 2.8*   GFR: Estimated Creatinine Clearance: 5.7 mL/min (by C-G formula based on SCr of 12.63 mg/dL). Liver Function Tests:  Recent Labs Lab 10/04/15 0846  AST 24  ALT 18  ALKPHOS 60  BILITOT 0.6  PROT 7.7  ALBUMIN 3.6   No results for input(s): LIPASE, AMYLASE  in the last 168 hours.  Recent Labs Lab 10/04/15 0846  AMMONIA 28   Coagulation Profile:  Recent Labs Lab 09/28/15 09/30/15 10/04/15 0930  INR 3.4* 2.1 2.73   Cardiac Enzymes: No results for input(s): CKTOTAL, CKMB, CKMBINDEX, TROPONINI in the last 168 hours. BNP (last 3 results) No results for input(s): PROBNP in the last 8760 hours. HbA1C: No results for input(s): HGBA1C in the last 72 hours. CBG: No results for input(s): GLUCAP in the last 168 hours. Lipid Profile: No results for input(s): CHOL, HDL, LDLCALC, TRIG, CHOLHDL, LDLDIRECT in the last 72 hours. Thyroid Function Tests: No results for input(s): TSH, T4TOTAL, FREET4, T3FREE, THYROIDAB in the last 72 hours. Anemia Panel: No results for input(s): VITAMINB12, FOLATE, FERRITIN, TIBC, IRON, RETICCTPCT in the last 72 hours. Urine analysis:    Component Value Date/Time   LABSPEC 1.015 01/26/2010 1707   PHURINE 6.5 01/26/2010 1707   GLUCOSEU 100 (A) 01/26/2010 1707   HGBUR SMALL (A) 01/26/2010 1707   BILIRUBINUR NEGATIVE 01/26/2010 1707   KETONESUR NEGATIVE 01/26/2010 1707   PROTEINUR >300 (A) 01/26/2010 1707   UROBILINOGEN 0.2 01/26/2010 1707   NITRITE NEGATIVE 01/26/2010 1707   LEUKOCYTESUR NEGATIVE 01/26/2010 1707    Creatinine Clearance: Estimated Creatinine Clearance: 5.7 mL/min (by C-G formula based on SCr of 12.63 mg/dL).  Sepsis  Labs: @LABRCNTIP (procalcitonin:4,lacticidven:4) )No results found for this or any previous visit (from the past 240 hour(s)).   Radiological Exams on Admission: Dg Chest 2 View  Result Date: 10/04/2015 CLINICAL DATA:  Cough and generalized weakness. EXAM: CHEST  2 VIEW COMPARISON:  09/23/2015 and prior radiographs FINDINGS: Cardiomegaly again identified. Mild pulmonary vascular congestion noted. Very mild interstitial opacities bilaterally may represent minimal interstitial edema. Trace bilateral pleural effusions are noted. Mild left basilar atelectasis identified. There is no evidence of pneumothorax. IMPRESSION: Cardiomegaly with pulmonary vascular congestion and possible minimal interstitial pulmonary edema. Trace bilateral pleural effusions. Mild bibasilar atelectasis. Electronically Signed   By: Harmon Pier M.D.   On: 10/04/2015 09:22    EKG: Independently reviewed.   Assessment/Plan Active Problems:   Weakness   Generalized weakness/physical deconditioning: slow progressive condition for pt. innadequate care at home. In home health care but appears that family can no longer care for pt as he becomes progressively more bed bound. Pt was set for DC from ED but no family or caregivers available to assist in caring for pt after DC - pt unsafe for discharge. Multiple attempts were made to reach family, including having [employer] go to home. Case management aware and following pt.  - observation - Social work - pt likely to need placement at [facility]. Discussed w/ family and they seem to be in agreement at this time - PT/OT - nutrition consult - family concerned that pt is not getting the nutrition he needs. Pre-albumin - MRI lumbar spine, legs appear somewhat atrophied over past few admissions. Intermittent back pain. Suspect poor exercise tolerance and malnutrition but ??? R/o Spinal stenosis??.   ESRD on dialysis: nephrology followoing. Dialysis MWF. Metabolic derangements noted typical for ESRD pt on  day of dialysis.  - Dialysis per nephrology.  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

## 2015-10-04 NOTE — ED Provider Notes (Signed)
[facility]-EMERGENCY DEPT Provider Note   CSN: [id number] Arrival date & time: 10/04/15  0804     History   Chief Complaint Chief Complaint  Patient presents with  . Fatigue    HPI  Blood pressure 161/71, pulse 60, temperature 98.1 F (36.7 C), temperature source Oral, resp. rate 26, height 6\' 4"  (1.93 m), weight 102.1 kg, SpO2 96 %.  [name] [name] is a 82 y.o. male complaining of not being able to walk, patient states that it's been several months and she is been able to walk, no acute change in his symptoms however patient is a very poor historian. He is coughing on my exam. His last dialysis was one week ago (however he told the nurse it was 3 days ago) states he is cared for by his wife who is not at bedside, cannot reach her by phone. Patient with no chest pain, shortness of breath, headache. Level V caveat secondary to confusion. States he has a wheelchair and a four-wheel walker at home.  HPI  Past Medical History:  Diagnosis Date  . Anemia   . Atrial flutter (HCC)   . Bradycardia    a. unable to tolerate beta blockers in the past.  . Diabetes mellitus (HCC)   . ESRD (end stage renal disease) on dialysis [facility])    "MWF; [address]" (04/08/2015)  . Glaucoma   . Gout   . Hypertension   . PAF (paroxysmal atrial fibrillation) (HCC)   . Peripheral neuropathy (HCC)   . PSVT (paroxysmal supraventricular tachycardia) (HCC)    a. h/o ablation.    Patient Active Problem List   Diagnosis Date Noted  . Weakness 10/04/2015  . Congestive heart failure (HCC)   . PAF (paroxysmal atrial fibrillation) (HCC) 04/20/2015  . Encephalopathy 04/20/2015  . Physical deconditioning 04/20/2015  . Diabetes mellitus with complication (HCC) 04/20/2015  . Acute respiratory failure with hypoxia (HCC)   . Atrial flutter with rapid ventricular response (HCC)   . Acute respiratory failure (HCC) 04/12/2015  . HCAP (healthcare-associated pneumonia) 04/12/2015  . Elevated troponin 04/12/2015    . Anemia 04/12/2015  . Bradycardia 11/19/2014  . Encounter for therapeutic drug monitoring 06/16/2014  . Sinus bradycardia-tachycardia syndrome (HCC) 01/11/2012    Class: Chronic  . Atrial flutter (HCC) 01/10/2012  . Blind left eye 01/10/2012  . ESRD (end stage renal disease) (HCC) 01/10/2012  . Atrial fibrillation with RVR (HCC) 01/10/2012  . Hypertension   . Peripheral neuropathy (HCC)   . Diabetes mellitus with ESRD (end-stage renal disease) (HCC)     Past Surgical History:  Procedure Laterality Date  . ABLATION OF DYSRHYTHMIC FOCUS    . SP AV DIALYSIS SHUNT ACCESS EXISTING *L*         Home Medications    Prior to Admission medications   Medication Sig Start Date End Date Taking? Authorizing Provider  B Complex-C-Folic Acid (RENA-VITE PO) Take 1 tablet by mouth daily.   Yes Historical Provider, MD  Calcium Acetate 667 MG TABS Take 1-2 capsules by mouth 3 (three) times daily. 667 mg for snacks, 1334 mg for meals   Yes Historical Provider, MD  diltiazem (CARDIZEM CD) 360 MG 24 hr capsule Take 1 capsule (360 mg total) by mouth daily. 08/31/15  Yes Rosalio Macadamia, NP  docusate sodium (COLACE) 100 MG capsule Take 100 mg by mouth 2 (two) times daily as needed for mild constipation or moderate constipation.    Yes Historical Provider, MD  dorzolamide-timolol (COSOPT) 22.3-6.8  MG/ML ophthalmic solution Place 1 drop into the right eye 2 (two) times daily.    Yes Historical Provider, MD  lidocaine-prilocaine (EMLA) cream Apply 1 application topically as needed (apply to skin near dialysis port before dialysis).  08/31/14  Yes Historical Provider, MD  metoprolol (LOPRESSOR) 50 MG tablet Take 1 tablet (50 mg total) by mouth 2 (two) times daily. 08/15/15  Yes Leone Brand, NP  neomycin-polymyxin b-dexamethasone (MAXITROL) 3.5-10000-0.1 SUSP Place 1 drop into the left eye 4 (four) times daily. 09/28/15  Yes Historical Provider, MD  prednisoLONE acetate (PRED FORTE) 1 % ophthalmic suspension  Place 1 drop into the left eye 2 (two) times daily. Patient to use for 37 days. Filled on 03-28-15   Yes Historical Provider, MD  SENSIPAR 60 MG tablet Take 60 mg by mouth at bedtime.  09/03/14  Yes Historical Provider, MD  tobramycin-dexamethasone Wallene Dales) ophthalmic solution Place 1 drop into the left eye 4 (four) times daily. For 37 days. Filled 03-31-15   Yes Historical Provider, MD  TRAVATAN Z 0.004 % SOLN ophthalmic solution Place 1 drop into the right eye at bedtime.  08/30/14  Yes Historical Provider, MD  warfarin (COUMADIN) 5 MG tablet Take as directed by [facility] Patient taking differently: Take 7.5 mg by mouth See admin instructions. 7.5 mg daily 07/09/15  Yes Lyn Records, MD  acetaminophen (TYLENOL) 325 MG tablet Take 650 mg by mouth daily as needed for mild pain.    Historical Provider, MD  atorvastatin (LIPITOR) 10 MG tablet Take 1 tablet (10 mg total) by mouth daily at 6 PM. 08/15/15   Leone Brand, NP    Family History Family History  Problem Relation Age of Onset  . Other Mother     bowel obstruction  . Heart failure Father     fluid bluid up  . Alcoholism Brother   . Alcoholism Brother     Social History Social History  Substance Use Topics  . Smoking status: Never Smoker  . Smokeless tobacco: Never Used  . Alcohol use No     Allergies   Diltiazem   Review of Systems Review of Systems  10 systems reviewed and found to be negative, except as noted in the HPI.   Physical Exam Updated Vital Signs BP 170/91 (BP Location: Right Arm)   Pulse 65   Temp 98.1 F (36.7 C) (Oral)   Resp 16   Ht 6\' 4"  (1.93 m)   Wt 102.1 kg   SpO2 100%   BMI 27.39 kg/m   Physical Exam  Constitutional: He is oriented to person, place, and time. He appears well-developed and well-nourished. No distress.  HENT:  Head: Normocephalic and atraumatic.  Mouth/Throat: Oropharynx is clear and moist.  Eyes:  Bilateral watery eye discharge, left eye with a large cataract.    Neck: Normal range of motion.  Cardiovascular: Normal rate, regular rhythm and intact distal pulses.   Fistula in the antecubital space with good thrill  Pulmonary/Chest: Effort normal and breath sounds normal.  Coughing, crackles at the bases bilaterally.  Abdominal: Soft. He exhibits no distension and no mass. There is no tenderness. There is no rebound and no guarding. No hernia.  Genitourinary:  Genitourinary Comments: Digital rectal exam a chaperoned by nurse: Normal rectal tone, normal stool color.  Musculoskeletal: Normal range of motion.  Neurological: He is alert and oriented to person, place, and time.  Oriented 3, difficult to understand  Skin: Capillary refill takes less than 2 seconds.  He is not diaphoretic.  Strength is decreased to bilateral lower extremities, patient can lift both legs up off the bed, he cannot stand. Strength is 3 out of 5 in the bilateral upper extremities, sensation is grossly intact.  Psychiatric: He has a normal mood and affect.  Nursing note and vitals reviewed.    ED Treatments / Results  Labs (all labs ordered are listed, but only abnormal results are displayed) Labs Reviewed  CBC WITH DIFFERENTIAL/PLATELET - Abnormal; Notable for the following:       Result Value   RBC 3.76 (*)    Hemoglobin 11.5 (*)    HCT 33.2 (*)    RDW 15.9 (*)    All other components within normal limits  COMPREHENSIVE METABOLIC PANEL - Abnormal; Notable for the following:    Sodium 129 (*)    Chloride 88 (*)    Glucose, Bld 228 (*)    BUN 64 (*)    Creatinine, Ser 12.63 (*)    GFR calc non Af Amer 3 (*)    GFR calc Af Amer 4 (*)    All other components within normal limits  MAGNESIUM - Abnormal; Notable for the following:    Magnesium 2.8 (*)    All other components within normal limits  PROTIME-INR - Abnormal; Notable for the following:    Prothrombin Time 29.4 (*)    All other components within normal limits  AMMONIA  URINALYSIS, ROUTINE W REFLEX  MICROSCOPIC (NOT AT [facility])  POC OCCULT BLOOD, ED    EKG  EKG Interpretation  Date/Time:  Monday October 04 2015 08:19:55 EDT Ventricular Rate:  63 PR Interval:    QRS Duration: 95 QT Interval:  432 QTC Calculation: 443 R Axis:   30 Text Interpretation:  Normal sinus rhythm First degree A-V block No significant change since last tracing Confirmed by Ethelda ChickJACUBOWITZ  MD, SAM [id number]) on 10/04/2015 8:50:42 AM       Radiology Dg Chest 2 View  Result Date: 10/04/2015 CLINICAL DATA:  Cough and generalized weakness. EXAM: CHEST  2 VIEW COMPARISON:  09/23/2015 and prior radiographs FINDINGS: Cardiomegaly again identified. Mild pulmonary vascular congestion noted. Very mild interstitial opacities bilaterally may represent minimal interstitial edema. Trace bilateral pleural effusions are noted. Mild left basilar atelectasis identified. There is no evidence of pneumothorax. IMPRESSION: Cardiomegaly with pulmonary vascular congestion and possible minimal interstitial pulmonary edema. Trace bilateral pleural effusions. Mild bibasilar atelectasis. Electronically Signed   By: Harmon PierJeffrey  Hu M.D.   On: 10/04/2015 09:22    Procedures Procedures (including critical care time)  Medications Ordered in ED Medications - No data to display   Initial Impression / Assessment and Plan / ED Course  I have reviewed the triage vital signs and the nursing notes.  Pertinent labs & imaging results that were available during my care of the patient were reviewed by me and considered in my medical decision making (see chart for details).  Clinical Course    Vitals:   10/04/15 1200 10/04/15 1234 10/04/15 1352 10/04/15 1424  BP: 154/74 155/85 148/78 170/91  Pulse: (!) 59 63 61 65  Resp: 16 19 17 16   Temp:      TempSrc:      SpO2: 91% 100% 100% 100%  Weight:      Height:        Medications - No data to display  [name] [name] is 82 y.o. male presenting with Generalized fatigue, states he cannot stand.  INR is  2.73. Not significantly  anemic, fecal occult blood is negative. EKG with no acute changes.  Discussed case with Dr. Arlean Hopping who will evaluate the patient, will need to be dialyzed today non-emergently. Chest x-ray with cardiomegaly and minimal interstitial edema.  Cannot contacts wife. Case management is consulted, they tried to reach her several times without luck. Police were dispatched at home, there was no answer and they knocked on the door, neighbors came out after they saw police and explained that the wife did go to the neighbor's house this morning to call 911. They states that she left the house afterwards, it's unclear where she is.  I think this patient will need admission as he can be discharged home because he has no caregiver. It is unclear if this weakness in the lower extremities is new or not. Case management has said that they had tried to arrange home health for him it's unclear if this actually happened. Will need observation admission.  D/w [facility] hospitalist Dr. Konrad Dolores who accepts observation admission, patient will be dialyzed this afternoon and he will try to work on disposition to avoid unnecessary admission.    Final Clinical Impressions(s) / ED Diagnoses   Final diagnoses:  None    New Prescriptions New Prescriptions   No medications on file     Wynetta Emery, PA-C 10/04/15 1429    Doug Sou, MD 10/04/15 [phone]

## 2015-10-04 NOTE — ED Notes (Signed)
Pt unable to void at this time. 

## 2015-10-04 NOTE — Progress Notes (Signed)
[place] [employer] back who reports that they were unable to make contact with the wife. Per [employer], Patient's wife went to the neighbors home and contacted EMS. The neighbors report that the Patient's wife left shortly after making the phone call. CSW continues to attempt to make contact with Patient's wife.     Lance MussAshley Gardner,MSW, LCSW [facility] Clinical Social Worker [phone]

## 2015-10-04 NOTE — Consult Note (Signed)
Renal Service Consult Note [facility]  [name] [name] 10/04/2015 [name] [name] D Requesting Physician:  Dr. Ethelda Chick  Reason for Consult: ESRD pt with gait failure HPI: The patient is a 82 y.o. year-old presenting with inability to ambulate, unable to get OOB and get to dialysis. Has had 5 admissions this year already , 3 for pulm edema/ CHF and 2 for afib with RVR.  On his last admission the dry weight was lowered from 101 to 97kg.  Returns now with FTT and gait failure.  Says he is just not strong enough to "motor".  Denies any CP, SOB, no leg pains or numbness.  Is not losing any wt, have looked back through middle of 2016.    Frustrated that we can't figure out what is wrong with him, said " I thought doctors knew everything".   In ED creat 12.63, BUN 64, K 4.7, Ca 9.8, Alb 3.6, Mg 2.8, Tprot 7.7,  NH3 28, LFT's wnl.   WBC 7k , Hb 11.5 , plt 206  ECHO July '17 - normal LVEF, normal study CXR 9/4 - mild CHF w IS edema, trace effusions Hb A1c 8/17 - 8.8 TSH 3/17 - 3.29 CT chest May '17 for 6 mos cough, hx PNA > liver, GB, R adrenal wnl.  11 mm L adrenal nodule.  L kidney, spleen, pancrease, stomach, bower unremarkable.  No mediastinal nodes, hila grossly unremarkable but noncontrast. Heart size nl. Cor art calc++.  Mild pulm ground glass changes bilat, no consolidataion or mass.  Airway wnl. No worrisome bony lytic lesions.  CT neck/ head Mar '17 after fall > brain atrophy, chronic small vessel ischemic changes throughout WM, thalami/ pons. No acute changes. C-spine showed extensive chronic DJD only, no stenosis of cord.    ROS  denies CP  no joint pain   no HA  no blurry vision  no rash  no diarrhea  no nausea/ vomiting  no dysuria  no difficulty voiding  no change in urine color    Past Medical History  Past Medical History:  Diagnosis Date  . Anemia   . Atrial flutter (HCC)   . Bradycardia    a. unable to tolerate beta blockers in the past.  .  Diabetes mellitus (HCC)   . ESRD (end stage renal disease) on dialysis [facility])    "MWF; [address]" (04/08/2015)  . Glaucoma   . Gout   . Hypertension   . PAF (paroxysmal atrial fibrillation) (HCC)   . Peripheral neuropathy (HCC)   . PSVT (paroxysmal supraventricular tachycardia) (HCC)    a. h/o ablation.   Past Surgical History  Past Surgical History:  Procedure Laterality Date  . ABLATION OF DYSRHYTHMIC FOCUS    . SP AV DIALYSIS SHUNT ACCESS EXISTING *L*     Family History  Family History  Problem Relation Age of Onset  . Other Mother     bowel obstruction  . Heart failure Father     fluid bluid up  . Alcoholism Brother   . Alcoholism Brother    Social History  reports that he has never smoked. He has never used smokeless tobacco. He reports that he does not drink alcohol or use drugs. Allergies  Allergies  Allergen Reactions  . Diltiazem Other (See Comments)    SEVERE STOMACH CRAMPING PER PT   Home medications Prior to Admission medications   Medication Sig Start Date End Date Taking? Authorizing Provider  B Complex-C-Folic Acid (RENA-VITE PO) Take 1 tablet  by mouth daily.   Yes Historical Provider, MD  Calcium Acetate 667 MG TABS Take 1-2 capsules by mouth 3 (three) times daily. 667 mg for snacks, 1334 mg for meals   Yes Historical Provider, MD  diltiazem (CARDIZEM CD) 360 MG 24 hr capsule Take 1 capsule (360 mg total) by mouth daily. 08/31/15  Yes Rosalio Macadamia, NP  docusate sodium (COLACE) 100 MG capsule Take 100 mg by mouth 2 (two) times daily as needed for mild constipation or moderate constipation.    Yes Historical Provider, MD  dorzolamide-timolol (COSOPT) 22.3-6.8 MG/ML ophthalmic solution Place 1 drop into the right eye 2 (two) times daily.    Yes Historical Provider, MD  lidocaine-prilocaine (EMLA) cream Apply 1 application topically as needed (apply to skin near dialysis port before dialysis).  08/31/14  Yes Historical Provider, MD  metoprolol (LOPRESSOR)  50 MG tablet Take 1 tablet (50 mg total) by mouth 2 (two) times daily. 08/15/15  Yes Leone Brand, NP  neomycin-polymyxin b-dexamethasone (MAXITROL) 3.5-10000-0.1 SUSP Place 1 drop into the left eye 4 (four) times daily. 09/28/15  Yes Historical Provider, MD  prednisoLONE acetate (PRED FORTE) 1 % ophthalmic suspension Place 1 drop into the left eye 2 (two) times daily. Patient to use for 37 days. Filled on 03-28-15   Yes Historical Provider, MD  SENSIPAR 60 MG tablet Take 60 mg by mouth at bedtime.  09/03/14  Yes Historical Provider, MD  tobramycin-dexamethasone Wallene Dales) ophthalmic solution Place 1 drop into the left eye 4 (four) times daily. For 37 days. Filled 03-31-15   Yes Historical Provider, MD  TRAVATAN Z 0.004 % SOLN ophthalmic solution Place 1 drop into the right eye at bedtime.  08/30/14  Yes Historical Provider, MD  warfarin (COUMADIN) 5 MG tablet Take as directed by [facility] Patient taking differently: Take 7.5 mg by mouth See admin instructions. 7.5 mg daily 07/09/15  Yes Lyn Records, MD  acetaminophen (TYLENOL) 325 MG tablet Take 650 mg by mouth daily as needed for mild pain.    Historical Provider, MD  atorvastatin (LIPITOR) 10 MG tablet Take 1 tablet (10 mg total) by mouth daily at 6 PM. 08/15/15   Leone Brand, NP   Liver Function Tests  Recent Labs Lab 10/04/15 0846  AST 24  ALT 18  ALKPHOS 60  BILITOT 0.6  PROT 7.7  ALBUMIN 3.6   No results for input(s): LIPASE, AMYLASE in the last 168 hours. CBC  Recent Labs Lab 10/04/15 0846  WBC 7.1  NEUTROABS 5.0  HGB 11.5*  HCT 33.2*  MCV 88.3  PLT 206   Basic Metabolic Panel  Recent Labs Lab 10/04/15 0846  NA 129*  K 4.7  CL 88*  CO2 26  GLUCOSE 228*  BUN 64*  CREATININE 12.63*  CALCIUM 9.8   Iron/TIBC/Ferritin/ %Sat    Component Value Date/Time   IRON 25 (L) 01/27/2010 1327   TIBC 207 (L) 01/27/2010 1327   FERRITIN 262 01/27/2010 1327   IRONPCTSAT 12 (L) 01/27/2010 1327    Vitals:   10/04/15  1352 10/04/15 1400 10/04/15 1424 10/04/15 1430  BP: 148/78 157/95 170/91   Pulse: 61 65 65 67  Resp: 17 18 16 20   Temp:      TempSrc:      SpO2: 100% 100% 100% 100%  Weight:      Height:       Exam Gen blind L eye, no distress, calm and weak , chron ill No rash, cyanosis or  gangrene Sclera anicteric, throat clear  No jvd or bruits Chest clear bilat to bases RRR 2/6 SEM  Abd soft ntnd no mass or ascites +bs no hsm GU normal male MS marked muscle wasting bilat calves especially Intact sensation grossly of UE's and LE's Ext no edema or wounds Neuro responds appropriately, nonfocal. Had pt attempt to sit, can sit for a few minutes by himself on side of bed, but begins to crumple over and can't hold his body up.  Leg strength in lying position is reasonable, able to raise straight legs bilat easily. But unable to stand/ bear any weight.  Some mild asterixis diffusely.  Ox 3 and alert.   LUA AVF +bruit   In ED creat 12.63, BUN 64, K 4.7, Ca 9.8, Alb 3.6, Mg 2.8, Tprot 7.7,  NH3 28, LFT's wnl.   WBC 7k , Hb 11.5 , plt 206 ECHO July '17 - normal LVEF, normal study CXR 9/4 - mild CHF w IS edema, trace effusions Hb A1c 8/17 - 8.8 TSH 3/17 - 3.29 CT chest May '17 for 6 mos cough, hx PNA > liver, GB, R adrenal wnl.  11 mm L adrenal nodule.  L kidney, spleen, pancrease, stomach, bower unremarkable.  No mediastinal nodes, hila grossly unremarkable but noncontrast. Heart size nl. Cor art calc++.  Mild pulm ground glass changes bilat, no consolidataion or mass.  Airway wnl. No worrisome bony lytic lesions.  CT neck/ head Mar '17 after fall > brain atrophy, chronic small vessel ischemic changes throughout WM, thalami/ pons. No acute changes. C-spine showed extensive chronic DJD only, no stenosis of cord.   Dialysis: [facility]  MWF  4h  97kg   Hep 4200  LUA AVF   Assessment: 1.  Gait failure/ gen'd weakness - main issue, progressive problem.  Sig muscle wasting in the legs.  Patient is compliant with HD  making chronic uremia an unlikely cause.  Would suggest a work-up for myopathy/ neuropathy. Have d/w primary. Also he had a 1 cm adrenal adenoma on CT in May, could have Cushing's syndrome. Have asked pharmacy to do a DST overnight.  2.  ESRD - HD MWF, goes to all sessions 3.  Vol excess - up 5kg, mild IS edema on CXR, not real symptomatic 4.  MBD - get records, cont phoslo/ Sensipar 5.  Anemia - get records 6.  Chron afib - on dilt/ MTP and coumadin    Plan - as above, HD today  Vinson Moselleob Fenris Cauble MD [facility] pager [phone]    cell [phone] 10/04/2015, 2:43 PM

## 2015-10-04 NOTE — Progress Notes (Signed)
ANTICOAGULATION CONSULT NOTE - Initial Consult  Pharmacy Consult for Coumadin Indication: atrial fibrillation  Allergies  Allergen Reactions  . Diltiazem Other (See Comments)    SEVERE STOMACH CRAMPING PER PT    Patient Measurements: Height: 6\' 4"  (193 cm) Weight: 226 lb 12.8 oz (102.9 kg) IBW/kg (Calculated) : 86.8  Vital Signs: Temp: 98.4 F (36.9 C) (09/04 1530) Temp Source: Oral (09/04 1530) BP: 164/79 (09/04 1530) Pulse Rate: 60 (09/04 1530)  Labs:  Recent Labs  10/04/15 0846 10/04/15 0930  HGB 11.5*  --   HCT 33.2*  --   PLT 206  --   LABPROT  --  29.4*  INR  --  2.73  CREATININE 12.63*  --     Estimated Creatinine Clearance: 5.7 mL/min (by C-G formula based on SCr of 12.63 mg/dL).   Medical History: Past Medical History:  Diagnosis Date  . Anemia   . Atrial flutter (HCC)   . Bradycardia    a. unable to tolerate beta blockers in the past.  . Diabetes mellitus (HCC)   . ESRD (end stage renal disease) on dialysis [facility](HCC)    "MWF; [address]" (04/08/2015)  . Glaucoma   . Gout   . Hypertension   . PAF (paroxysmal atrial fibrillation) (HCC)   . Peripheral neuropathy (HCC)   . PSVT (paroxysmal supraventricular tachycardia) (HCC)    a. h/o ablation.    Assessment: 82 y/o M with PMH significant anemia, Afib/aflutter, DM, ESRD, glaucoma, gout, HTN, peripheral neuropathy, CHF, and PSVT presents with inability to walk and general weakness.  Anticoag: h/o afib/flutter on Coumadin PTA. INR 2.73 on admit. Anemia of chronic dz with admit Hgb 11.5. FOB negative  Goal of Therapy:  INR 2-3 Monitor platelets by anticoagulation protocol: Yes   Plan:  Continue home warfarin 7.5mg  daily Daily INR   Lura Falor S. Merilynn Finlandobertson, PharmD, BCPS Clinical Staff Pharmacist Pager [phone]  Misty Stanleyobertson, Odelle Kosier Stillinger 10/04/2015,6:37 PM

## 2015-10-04 NOTE — ED Notes (Signed)
Sat pt up on side of bed to void in urinal, not able to void.

## 2015-10-04 NOTE — ED Triage Notes (Signed)
Pt to ER BIB [facility] from home with complaint of generalized weakness onset this morning, reports "worse than usual." Hemodialysis patient receives dialysis MWF, due for tx today. Last tx Friday. Audible wheezing present on arrival, does not appear short of breath, pulse ox reading 95% on RA. Grips strengths equal bilaterally. Denies pain. Fistula to LUE, thrill present. VS - 18 RR, 60 HR, BP 172/76, temp. 98.1, SpO2 96%. Pt reports he has not taken his medications today.

## 2015-10-04 NOTE — Progress Notes (Signed)
CSW has attempted Patient's wife x3 with no answer. [place] [employer] [place]'s [place] in attempts to locate wife. CSW continues to follow.          Lance MussAshley Gardner,MSW, LCSW [facility] Clinical Social Worker [phone]

## 2015-10-05 ENCOUNTER — Observation Stay (HOSPITAL_COMMUNITY): Payer: Medicare Other

## 2015-10-05 ENCOUNTER — Ambulatory Visit: Payer: Medicare Other | Admitting: Nurse Practitioner

## 2015-10-05 DIAGNOSIS — R531 Weakness: Secondary | ICD-10-CM

## 2015-10-05 DIAGNOSIS — R5383 Other fatigue: Secondary | ICD-10-CM | POA: Diagnosis not present

## 2015-10-05 DIAGNOSIS — I132 Hypertensive heart and chronic kidney disease with heart failure and with stage 5 chronic kidney disease, or end stage renal disease: Secondary | ICD-10-CM | POA: Diagnosis not present

## 2015-10-05 DIAGNOSIS — N186 End stage renal disease: Secondary | ICD-10-CM | POA: Diagnosis not present

## 2015-10-05 LAB — VITAMIN B12: Vitamin B-12: 1287 pg/mL — ABNORMAL HIGH (ref 180–914)

## 2015-10-05 LAB — BASIC METABOLIC PANEL
ANION GAP: 18 — AB (ref 5–15)
BUN: 77 mg/dL — ABNORMAL HIGH (ref 6–20)
CALCIUM: 9.6 mg/dL (ref 8.9–10.3)
CHLORIDE: 87 mmol/L — AB (ref 101–111)
CO2: 23 mmol/L (ref 22–32)
Creatinine, Ser: 13.89 mg/dL — ABNORMAL HIGH (ref 0.61–1.24)
GFR calc non Af Amer: 3 mL/min — ABNORMAL LOW (ref >60)
GFR, EST AFRICAN AMERICAN: 3 mL/min — AB (ref >60)
Glucose, Bld: 171 mg/dL — ABNORMAL HIGH (ref 65–99)
POTASSIUM: 4.9 mmol/L (ref 3.5–5.1)
Sodium: 128 mmol/L — ABNORMAL LOW (ref 135–145)

## 2015-10-05 LAB — PROTIME-INR
INR: 2.74
Prothrombin Time: 29.6 seconds — ABNORMAL HIGH (ref 11.4–15.2)

## 2015-10-05 LAB — CBC
HEMATOCRIT: 33.7 % — AB (ref 39.0–52.0)
HEMOGLOBIN: 11.4 g/dL — AB (ref 13.0–17.0)
MCH: 29.9 pg (ref 26.0–34.0)
MCHC: 33.8 g/dL (ref 30.0–36.0)
MCV: 88.5 fL (ref 78.0–100.0)
Platelets: 212 10*3/uL (ref 150–400)
RBC: 3.81 MIL/uL — AB (ref 4.22–5.81)
RDW: 15.9 % — ABNORMAL HIGH (ref 11.5–15.5)
WBC: 6.8 10*3/uL (ref 4.0–10.5)

## 2015-10-05 LAB — GLUCOSE, CAPILLARY
GLUCOSE-CAPILLARY: 238 mg/dL — AB (ref 65–99)
GLUCOSE-CAPILLARY: 149 mg/dL — AB (ref 65–99)
GLUCOSE-CAPILLARY: 174 mg/dL — AB (ref 65–99)
Glucose-Capillary: 168 mg/dL — ABNORMAL HIGH (ref 65–99)
Glucose-Capillary: 311 mg/dL — ABNORMAL HIGH (ref 65–99)

## 2015-10-05 LAB — FOLATE: Folate: 48 ng/mL (ref >5.9)

## 2015-10-05 LAB — TSH: TSH: 2.005 u[IU]/mL (ref 0.350–4.500)

## 2015-10-05 LAB — CORTISOL-AM, BLOOD: CORTISOL - AM: 4.9 ug/dL — AB (ref 6.7–22.6)

## 2015-10-05 LAB — SEDIMENTATION RATE: SED RATE: 108 mm/h — AB (ref 0–16)

## 2015-10-05 MED ORDER — WARFARIN SODIUM 7.5 MG PO TABS
7.5000 mg | ORAL_TABLET | Freq: Once | ORAL | Status: AC
  Administered 2015-10-05: 7.5 mg via ORAL
  Filled 2015-10-05 (×2): qty 1

## 2015-10-05 MED ORDER — SODIUM CHLORIDE 0.9 % IV SOLN: 100.0000 mL | INTRAVENOUS | Status: DC | PRN

## 2015-10-05 MED ORDER — ALTEPLASE 2 MG IJ SOLR: 2.0000 mg | Freq: Once | INTRAMUSCULAR | Status: DC | PRN

## 2015-10-05 MED ORDER — CALCIUM ACETATE (PHOS BINDER) 667 MG PO CAPS
1334.0000 mg | ORAL_CAPSULE | Freq: Three times a day (TID) | ORAL | Status: DC
  Administered 2015-10-05 – 2015-10-07 (×6): 1334 mg via ORAL
  Filled 2015-10-05 (×8): qty 2

## 2015-10-05 MED ORDER — LORAZEPAM 2 MG/ML IJ SOLN
1.0000 mg | Freq: Once | INTRAMUSCULAR | Status: DC
Start: 2015-10-05 — End: 2015-10-08

## 2015-10-05 MED ORDER — HEPARIN SODIUM (PORCINE) 1000 UNIT/ML DIALYSIS: 1000.0000 [IU] | INTRAMUSCULAR | Status: DC | PRN

## 2015-10-05 MED ORDER — LIDOCAINE HCL (PF) 1 % IJ SOLN: 5.0000 mL | INTRAMUSCULAR | Status: DC | PRN

## 2015-10-05 MED ORDER — DILTIAZEM HCL ER COATED BEADS 180 MG PO CP24
300.0000 mg | ORAL_CAPSULE | Freq: Every day | ORAL | Status: DC
  Administered 2015-10-06 – 2015-10-07 (×2): 300 mg via ORAL
  Filled 2015-10-05: qty 1

## 2015-10-05 MED ORDER — HEPARIN SODIUM (PORCINE) 1000 UNIT/ML DIALYSIS: 4000.0000 [IU] | Freq: Once | INTRAMUSCULAR | Status: DC

## 2015-10-05 MED ORDER — PENTAFLUOROPROP-TETRAFLUOROETH EX AERO: 1.0000 "application " | INHALATION_SPRAY | CUTANEOUS | Status: DC | PRN

## 2015-10-05 MED ORDER — CALCIUM ACETATE (PHOS BINDER) 667 MG PO CAPS
1334.0000 mg | ORAL_CAPSULE | Freq: Three times a day (TID) | ORAL | Status: DC
  Filled 2015-10-05 (×2): qty 2

## 2015-10-05 MED ORDER — LIDOCAINE-PRILOCAINE 2.5-2.5 % EX CREA: 1.0000 "application " | TOPICAL_CREAM | CUTANEOUS | Status: DC | PRN

## 2015-10-05 MED ORDER — METOPROLOL TARTRATE 25 MG PO TABS
25.0000 mg | ORAL_TABLET | Freq: Two times a day (BID) | ORAL | Status: DC
  Administered 2015-10-05 – 2015-10-07 (×3): 25 mg via ORAL
  Filled 2015-10-05 (×3): qty 1

## 2015-10-05 MED ORDER — CALCIUM ACETATE (PHOS BINDER) 667 MG PO CAPS
667.0000 mg | ORAL_CAPSULE | Freq: Three times a day (TID) | ORAL | Status: DC | PRN
  Filled 2015-10-05: qty 1

## 2015-10-05 MED ORDER — DORZOLAMIDE HCL-TIMOLOL MAL 2-0.5 % OP SOLN
1.0000 [drp] | Freq: Two times a day (BID) | OPHTHALMIC | Status: DC
  Administered 2015-10-05 – 2015-10-07 (×5): 1 [drp] via OPHTHALMIC
  Filled 2015-10-05: qty 10

## 2015-10-05 NOTE — Progress Notes (Addendum)
[facility] Progress Note   Subjective: no c/o  Vitals:   10/05/15 1300 10/05/15 1330 10/05/15 1400 10/05/15 1430  BP: 132/64 122/65 123/68 125/69  Pulse: (!) 45 (!) 48 (!) 51 (!) 54  Resp: 20 13 15 18   Temp:      TempSrc:      SpO2:      Weight:      Height:        Inpatient medications: . atorvastatin  10 mg Oral q1800  . calcium acetate  1,334 mg Oral TID WC  . cinacalcet  60 mg Oral QHS  . diltiazem  300 mg Oral Daily  . dorzolamide-timolol  1 drop Right Eye BID  . feeding supplement (NEPRO CARB STEADY)  237 mL Oral BID BM  . heparin  4,000 Units Dialysis Once in dialysis  . insulin aspart  0-5 Units Subcutaneous QHS  . insulin aspart  0-9 Units Subcutaneous TID WC  . latanoprost  1 drop Right Eye QHS  . LORazepam  1 mg Intravenous Once  . metoprolol  25 mg Oral BID  . multivitamin  1 tablet Oral QHS  . neomycin-polymyxin b-dexamethasone  1 drop Left Eye QID  . prednisoLONE acetate  1 drop Left Eye BID  . warfarin  7.5 mg Oral ONCE-1800  . Warfarin - Pharmacist Dosing Inpatient   Does not apply q1800     sodium chloride, sodium chloride, acetaminophen **OR** acetaminophen, ALPRAZolam, alteplase, calcium acetate, heparin, lidocaine (PF), lidocaine-prilocaine, lidocaine-prilocaine, oxyCODONE, pentafluoroprop-tetrafluoroeth, promethazine  Exam: Gen blind L eye, no distress, calm and weak , chron ill  No jvd or bruits Chest clear bilat to bases RRR 2/6 SEM  Abd soft ntnd no mass or ascites +bs no hsm MS marked muscle wasting bilat calves especially Intact sensation grossly of UE's and LE's Ext no edema or wounds Neuro responds appropriately, nonfocal. Can raise legs lying flat but unable to stand/ bear any weight, unable to sit up for more than a couple minutes.  Ox 3 and alert.   LUA AVF +bruit   ECHO July '17 - normal LVEF, normal study CXR 9/4 - mild CHF w IS edema, trace effusions Hb A1c 8/17 - 8.8 TSH 3/17 - 3.29 CT chest May '17 for 6  mos cough, hx PNA > liver, GB, R adrenal wnl.  11 mm L adrenal nodule.  L kidney, spleen, pancrease, stomach, bower unremarkable.  No mediastinal nodes, hila grossly unremarkable but noncontrast. Heart size nl. Cor art calc++.  Mild pulm ground glass changes bilat, no consolidataion or mass.  Airway wnl. No worrisome bony lytic lesions.  CT neck/ head Mar '17 after fall > brain atrophy, chronic small vessel ischemic changes throughout WM, thalami/ pons. No acute changes. C-spine showed extensive chronic DJD only, no stenosis of cord.   Dialysis: [facility]  MWF  4h  97kg   Hep 4200  LUA AVF   Assessment: 1.  Gait failure/ gen'd weakness - main issue, progressive problem.  Sig muscle wasting in the legs.  Hx severe c-spine DJD last CT in March, consider repeat. Would recommend neuro consult.  DST shows suppressed cortisol this am after 8 pm decadron last night.  2.  ESRD - HD MWF, goes to all sessions.  HD today, refused HD last night 3.  Vol excess - +6 kg by wts, IS edema on CXR, not symptomatic 4.  MBD - get records, cont phoslo/ Sensipar 5.  Anemia - get records 6.  Chron afib - on  dilt/ MTP and coumadin   Plan - HD today and then resume w HD tomorrow. Get vol down. As above.   Vinson Moselle MD [facility] pager [phone]    cell [phone] 10/05/2015, 2:35 PM    Recent Labs Lab 10/04/15 0846 10/04/15 1830 10/05/15 0608  NA 129*  --  128*  K 4.7  --  4.9  CL 88*  --  87*  CO2 26  --  23  GLUCOSE 228*  --  171*  BUN 64*  --  77*  CREATININE 12.63* 13.35* 13.89*  CALCIUM 9.8  --  9.6    Recent Labs Lab 10/04/15 0846  AST 24  ALT 18  ALKPHOS 60  BILITOT 0.6  PROT 7.7  ALBUMIN 3.6    Recent Labs Lab 10/04/15 0846 10/05/15 0608  WBC 7.1 6.8  NEUTROABS 5.0  --   HGB 11.5* 11.4*  HCT 33.2* 33.7*  MCV 88.3 88.5  PLT 206 212   Iron/TIBC/Ferritin/ %Sat    Component Value Date/Time   IRON 25 (L) 01/27/2010 1327   TIBC 207 (L) 01/27/2010 1327   FERRITIN 262  01/27/2010 1327   IRONPCTSAT 12 (L) 01/27/2010 1327

## 2015-10-05 NOTE — Care Management Note (Addendum)
Case Management Note  Patient Details  Name: [name] [name] MRN: [id number] Date of Birth: [birth year]  Subjective/Objective:       CM following for progression and d/c planning.              Action/Plan: 10/05/2015 Pt determined to meet OBS criteria only and possibility for pt not meeting OBS. This CM reviewed pt assessment and noted that pt has difficulty following directions or responding appropriately to questions. This CM attempted to contact pt wife, [name] [name] is the only listed contact, however there is no answer at the number provided. This pt and/or family need to be informed that pt is not meeting criteria , OBS notification and ABN need to be given . As of 4pm 10/05/2015 this CM has been unable to contact family and pt is not able to comprehend the conversation. Will continue to try to reach pt wife at [phone].  Expected Discharge Date:                  Expected Discharge Plan:     In-House Referral:  Clinical Social Work  Discharge planning Services  CM Consult  Post Acute Care Choice:    Choice offered to:     DME Arranged:    DME Agency:     HH Arranged:    HH Agency:     Status of Service:  In process, will continue to follow  If discussed at Long Length of Stay Meetings, dates discussed:    Additional Comments:  [name], Bevely Hackbart U, RN 10/05/2015, 4:07 PM

## 2015-10-05 NOTE — Progress Notes (Signed)
Occupational Therapy Evaluation Patient Details Name: [name] [name] MRN: [id number] DOB: September 17, 1933 Today's Date: 10/05/2015    History of Present Illness [name] [name] is a 82 y.o. male with medical history significant for ESRD on hemodialysis Monday Wednesday and Friday, HTN diabetes, atrial fibrillation with RVR, with multiple admissions, last on 08/12/2015, previous history of PA F , s/p ablation for PSVT, presenting today to the emergency department with generalized weakness and 1 week history of progressive dyspnea, accompanied by productive frothy cough.   Clinical Impression   Patient presents to OT with decreased ADL independence and safety due to the deficits listed below. HE will benefit from skilled OT to maximize function and to facilitate a safe discharge. OT will follow.    Follow Up Recommendations  SNF    Equipment Recommendations  Other (comment) (TBD next venue)    Recommendations for Other Services       Precautions / Restrictions Precautions Precautions: Fall      Mobility Bed Mobility Overal bed mobility: Needs Assistance Bed Mobility: Supine to Sit;Sit to Supine     Supine to sit: Mod assist;Max assist;+2 for physical assistance;+2 for safety/equipment Sit to supine: Mod assist;Max assist;+2 for physical assistance;+2 for safety/equipment      Transfers Overall transfer level: Needs assistance Equipment used: 2 person hand held assist Transfers: Sit to/from [employer] Transfers Sit to Stand: Mod assist;Max assist;+2 physical assistance;+2 safety/equipment;From elevated surface Stand pivot transfers: Mod assist;Max assist;+2 physical assistance;+2 safety/equipment       General transfer comment: third person to hold chair    Balance                                            ADL Overall ADL's : Needs assistance/impaired     Grooming: Wash/dry face;Set up;Sitting       Lower Body Bathing: Maximal  assistance;Total assistance;Bed level   Upper Body Dressing : Maximal assistance;Bed level Upper Body Dressing Details (indicate cue type and reason): change gown Lower Body Dressing: Total assistance;Bed level               Functional mobility during ADLs: Moderate assistance;Maximal assistance;+2 for physical assistance;+2 for safety/equipment General ADL Comments: Patient found saturated in urine and attempting to take off gown. Assisted pt with clean up, change gown and bed pads, then mobility up to chair     Vision     Perception     Praxis      Pertinent Vitals/Pain Pain Assessment: No/denies pain     Hand Dominance     Extremity/Trunk Assessment Upper Extremity Assessment Upper Extremity Assessment: Generalized weakness (UE jerkiness noted)   Lower Extremity Assessment Lower Extremity Assessment: Defer to PT evaluation       Communication Communication Communication: Other (comment) (speech difficult to understand)   Cognition Arousal/Alertness: Awake/alert Behavior During Therapy: WFL for tasks assessed/performed Overall Cognitive Status: No family/caregiver present to determine baseline cognitive functioning                     General Comments       Exercises       Shoulder Instructions      Home Living Family/patient expects to be discharged to:: Unsure Living Arrangements: Spouse/significant other Available Help at Discharge: Family Type of Home: House Home Access: Stairs to enter [employer] of Steps: 1 Entrance Stairs-Rails: None  Home Layout: One level     Bathroom Shower/Tub: Chief Strategy OfficerTub/shower unit   Bathroom Toilet: Standard     Home Equipment: Cane - single point;Walker - 2 wheels   Additional Comments: information taken from recent admission in August 2017; pt unable to provide this information this date      Prior Functioning/Environment Level of Independence: Needs assistance  Gait / Transfers Assistance  Needed: Uses RW ADL's / Homemaking Assistance Needed: wife assists with bathing and dressing   Comments: information taken from recent admission in August 2017; pt unable to provide this information this date    OT Diagnosis: Generalized weakness;Cognitive deficits   OT Problem List: Decreased strength;Decreased activity tolerance;Impaired balance (sitting and/or standing);Decreased coordination;Decreased safety awareness;Decreased knowledge of use of DME or AE;Decreased knowledge of precautions;Impaired UE functional use   OT Treatment/Interventions: Self-care/ADL training;Neuromuscular education;Therapeutic exercise;DME and/or AE instruction;Therapeutic activities;Patient/family education    OT Goals(Current goals can be found in the care plan section) Acute Rehab OT Goals Patient Stated Goal: none stated OT Goal Formulation: With patient Time For Goal Achievement: 10/19/15 Potential to Achieve Goals: Fair ADL Goals Pt Will Perform Grooming: with set-up;sitting Pt Will Perform Upper Body Bathing: with min assist;sitting Pt Will Perform Lower Body Bathing: with mod assist;sit to/from stand Pt Will Perform Upper Body Dressing: with min assist;sitting Pt Will Perform Lower Body Dressing: with mod assist;sit to/from stand Pt Will Transfer to Toilet: with min assist;bedside commode  OT Frequency: Min 2X/week   Barriers to D/C:            Co-evaluation PT/OT/SLP Co-Evaluation/Treatment: Yes Reason for Co-Treatment: Complexity of the patient's impairments (multi-system involvement);For patient/therapist safety PT goals addressed during session: Mobility/safety with mobility OT goals addressed during session: ADL's and self-care      End of Session Equipment Utilized During Treatment: Gait belt Nurse Communication: Mobility status;Need for lift equipment  Activity Tolerance: Patient tolerated treatment well Patient left: in chair;with call bell/phone within reach;with chair alarm  set;with nursing/sitter in room   Time: [id number] OT Time Calculation (min): 29 min Charges:  OT General Charges $OT Visit: 1 Procedure OT Evaluation $OT Eval Moderate Complexity: 1 Procedure G-Codes: OT G-codes **NOT FOR INPATIENT CLASS** Functional Assessment Tool Used: clinical judgment Functional Limitation: Self care Self Care Current Status (B1478(G8987): At least 60 percent but less than 80 percent impaired, limited or restricted Self Care Goal Status (G9562(G8988): At least 40 percent but less than 60 percent impaired, limited or restricted  Kerry Chisolm A 10/05/2015, 2:48 PM

## 2015-10-05 NOTE — Progress Notes (Signed)
ANTICOAGULATION CONSULT NOTE - Initial Consult  Pharmacy Consult for Coumadin Indication: atrial fibrillation  Allergies  Allergen Reactions  . Diltiazem Other (See Comments)    SEVERE STOMACH CRAMPING PER PT    Patient Measurements: Height: 6\' 4"  (193 cm) Weight: 228 lb 9.9 oz (103.7 kg) IBW/kg (Calculated) : 86.8  Vital Signs: Temp: 98 F (36.7 C) (09/05 0912) Temp Source: Oral (09/05 0912) BP: 121/53 (09/05 0912) Pulse Rate: 46 (09/05 0912)  Labs:  Recent Labs  10/04/15 0846 10/04/15 0930 10/04/15 1830 10/05/15 0608  HGB 11.5*  --   --  11.4*  HCT 33.2*  --   --  33.7*  PLT 206  --   --  212  LABPROT  --  29.4*  --  29.6*  INR  --  2.73  --  2.74  CREATININE 12.63*  --  13.35* 13.89*    Estimated Creatinine Clearance: 5.2 mL/min (by C-G formula based on SCr of 13.89 mg/dL).   Medical History: Past Medical History:  Diagnosis Date  . Anemia   . Atrial flutter (HCC)   . Bradycardia    a. unable to tolerate beta blockers in the past.  . Diabetes mellitus (HCC)   . ESRD (end stage renal disease) on dialysis [facility](HCC)    "MWF; [address]" (04/08/2015)  . Glaucoma   . Gout   . Hypertension   . PAF (paroxysmal atrial fibrillation) (HCC)   . Peripheral neuropathy (HCC)   . PSVT (paroxysmal supraventricular tachycardia) (HCC)    a. h/o ablation.    Assessment: 82 y/o M with PMH significant anemia, Afib/aflutter, DM, ESRD, glaucoma, gout, HTN, peripheral neuropathy, CHF, and PSVT presents with inability to walk and general weakness.  Anticoag: h/o afib/flutter on Coumadin PTA. INR 2.73 on admit date 10/04/15.  Today the INR = 2.74, remains therapeutic on continued PTA dosage 7.5 mg daily.   Anemia of chronic dz with admit Hgb 11.5, now 11.4 today.  FOB was negative on 10/04/15. No bleeding noted.   Goal of Therapy:  INR 2-3 Monitor platelets by anticoagulation protocol: Yes   Plan:  Continue home warfarin 7.5mg  daily Daily INR   [name] [name],  RPh Clinical Pharmacist Pager: [phone] 10/05/2015,12:46 PM

## 2015-10-05 NOTE — Consult Note (Signed)
NEURO HOSPITALIST CONSULT NOTE   Requestig physician: Dr. Thedore Mins   Reason for Consult: LE weakness     HPI:                                                                                                                                          [name] [name] is an 82 y.o. male  with medical history significant of anemia, atrial flutter, diabetes, ESRD on dialysis Monday was a Friday, glaucoma, gout, HTN. Inability to walk and general weakness. Progressive over the last several months with acute worsening over the last couple of days. Unfortunately patient currently is unable to give me any history. He is able to take part in exam however. I attempted to contact patient's wife but she is not answering the phone. She currently is in dialysis. Patient does state that he has had multiple months of lower extremity weakness. Review of labs shows a sodium of 128 a creatinine of 13.89. He is afebrile and bradycardic with pulses in the 46-50 range with blood pressure between 121 and 168 systolically. Her allergy has been asked to evaluate this patient for lower extremity weakness as he has been having difficulty walking and ambulating over the past months.  Past Medical History:  Diagnosis Date  . Anemia   . Atrial flutter (HCC)   . Bradycardia    a. unable to tolerate beta blockers in the past.  . Diabetes mellitus (HCC)   . ESRD (end stage renal disease) on dialysis [facility])    "MWF; [address]" (04/08/2015)  . Glaucoma   . Gout   . Hypertension   . PAF (paroxysmal atrial fibrillation) (HCC)   . Peripheral neuropathy (HCC)   . PSVT (paroxysmal supraventricular tachycardia) (HCC)    a. h/o ablation.    Past Surgical History:  Procedure Laterality Date  . ABLATION OF DYSRHYTHMIC FOCUS    . SP AV DIALYSIS SHUNT ACCESS EXISTING *L*      Family History  Problem Relation Age of Onset  . Other Mother     bowel obstruction  . Heart failure Father     fluid bluid  up  . Alcoholism Brother   . Alcoholism Brother      Social History:  reports that he has never smoked. He has never used smokeless tobacco. He reports that he does not drink alcohol or use drugs.  Allergies  Allergen Reactions  . Diltiazem Other (See Comments)    SEVERE STOMACH CRAMPING PER PT    MEDICATIONS:  Prior to Admission:  Prescriptions Prior to Admission  Medication Sig Dispense Refill Last Dose  . B Complex-C-Folic Acid (RENA-VITE PO) Take 1 tablet by mouth daily.   10/03/2015 at Unknown time  . Calcium Acetate 667 MG TABS Take 1-2 capsules by mouth 3 (three) times daily. 667 mg for snacks, 1334 mg for meals   10/03/2015 at Unknown time  . diltiazem (CARDIZEM CD) 360 MG 24 hr capsule Take 1 capsule (360 mg total) by mouth daily. 30 capsule 6 10/03/2015 at Unknown time  . docusate sodium (COLACE) 100 MG capsule Take 100 mg by mouth 2 (two) times daily as needed for mild constipation or moderate constipation.    10/03/2015 at Unknown time  . dorzolamide-timolol (COSOPT) 22.3-6.8 MG/ML ophthalmic solution Place 1 drop into the right eye 2 (two) times daily.    10/03/2015 at Unknown time  . lidocaine-prilocaine (EMLA) cream Apply 1 application topically as needed (apply to skin near dialysis port before dialysis).    Past Month at Unknown time  . metoprolol (LOPRESSOR) 50 MG tablet Take 1 tablet (50 mg total) by mouth 2 (two) times daily. 60 tablet 6 10/03/2015 at 2000  . neomycin-polymyxin b-dexamethasone (MAXITROL) 3.5-10000-0.1 SUSP Place 1 drop into the left eye 4 (four) times daily.   10/03/2015 at Unknown time  . prednisoLONE acetate (PRED FORTE) 1 % ophthalmic suspension Place 1 drop into the left eye 2 (two) times daily. Patient to use for 37 days. Filled on 03-28-15   10/03/2015 at Unknown time  . SENSIPAR 60 MG tablet Take 60 mg by mouth at bedtime.    10/03/2015 at Unknown time   . tobramycin-dexamethasone (TOBRADEX) ophthalmic solution Place 1 drop into the left eye 4 (four) times daily. For 37 days. Filled 03-31-15   10/03/2015 at Unknown time  . TRAVATAN Z 0.004 % SOLN ophthalmic solution Place 1 drop into the right eye at bedtime.    10/03/2015 at Unknown time  . warfarin (COUMADIN) 5 MG tablet Take as directed by [facility] (Patient taking differently: Take 7.5 mg by mouth See admin instructions. 7.5 mg daily) 50 tablet 3 10/03/2015 at Unknown time  . acetaminophen (TYLENOL) 325 MG tablet Take 650 mg by mouth daily as needed for mild pain.   unkn  . atorvastatin (LIPITOR) 10 MG tablet Take 1 tablet (10 mg total) by mouth daily at 6 PM. 30 tablet 6 09/21/2015 at 6p   Scheduled: . atorvastatin  10 mg Oral q1800  . calcium acetate  1,334 mg Oral TID WC  . cinacalcet  60 mg Oral QHS  . diltiazem  300 mg Oral Daily  . dorzolamide-timolol  1 drop Right Eye BID  . feeding supplement (NEPRO CARB STEADY)  237 mL Oral BID BM  . insulin aspart  0-5 Units Subcutaneous QHS  . insulin aspart  0-9 Units Subcutaneous TID WC  . latanoprost  1 drop Right Eye QHS  . LORazepam  1 mg Intravenous Once  . metoprolol  25 mg Oral BID  . multivitamin  1 tablet Oral QHS  . neomycin-polymyxin b-dexamethasone  1 drop Left Eye QID  . prednisoLONE acetate  1 drop Left Eye BID  . warfarin  7.5 mg Oral ONCE-1800  . Warfarin - Pharmacist Dosing Inpatient   Does not apply q1800     ROS:  History obtained from unobtainable from patient due to mental status    Blood pressure 131/72, pulse (!) 55, temperature 98 F (36.7 C), temperature source Oral, resp. rate 16, height 6\' 4"  (1.93 m), weight 103.8 kg (228 lb 13.4 oz), SpO2 95 %.   Neurologic Examination:                                                                                                       HEENT-  Normocephalic, no lesions, without obvious abnormality.  Normal external eye and conjunctiva.  Normal TM's bilaterally.  Normal auditory canals and external ears. Normal external nose, mucus membranes and septum.  Normal pharynx. Cardiovascular- S1, S2 normal, pulses palpable throughout   Lungs- chest clear, no wheezing, rales, normal symmetric air entry Abdomen- normal findings: bowel sounds normal Extremities- no edema Lymph-no adenopathy palpable Musculoskeletal-no joint tenderness, deformity or swelling Skin-warm and dry, no hyperpigmentation, vitiligo, or suspicious lesions  Neurological Examination Mental Status: Alert, he is oriented to hospital but not date month. He is unable to answer multiple questions and I've asked him and tends to repeat the same answer over and over. Speech is dysarthric due to being adentulous.  She was able to follow commands however I have to ask multiple times for him to follow the commands.  Cranial Nerves: II:  Visual fields grossly normal in the right eye. Left eye has severe's scleral scarring and he barely can open his eyelids. Right eyes pupil is a 2 mm and reactive.  III,IV, VI: ptosis currently present left eye, extra-ocular motions intact in the right eye V,VII: Face symmetric, facial light touch sensation normal bilaterally VIII: hearing decreased bilaterally IX,X: uvula rises symmetrically XI: bilateral shoulder shrug XII: midline tongue extension Motor: Right : Upper extremity   5/5    Left:     Upper extremity   5/5  Lower extremity   5/5     Lower extremity   5/5 --He has definitive arthritis in the knees and significant arthritis with overlapping toes in bilateral feet Tone and bulk:normal tone throughout; no atrophy noted Sensory: Pinprick and light touch intact throughout, bilaterally in upper extremities, he has significant peripheral neuropathy from his knees down to his feet bilaterally. He has no proprioception in bilateral  feet no vibratory sensation up to his knees, and decreased temperature up to the level of his knee. Deep Tendon Reflexes: 2+ and symmetric throughout upper extremities with no lower extremity deep tendon reflexes Plantars: Mute bilaterally Cerebellar: Unable to assess due to cognition and difficulty as he is hooked up to hemodialysis Gait: Not tested     Lab Results: Basic Metabolic Panel:  Recent Labs Lab 10/04/15 0846 10/04/15 1830 10/05/15 0608  NA 129*  --  128*  K 4.7  --  4.9  CL 88*  --  87*  CO2 26  --  23  GLUCOSE 228*  --  171*  BUN 64*  --  77*  CREATININE 12.63* 13.35* 13.89*  CALCIUM 9.8  --  9.6  MG 2.8*  --   --  Liver Function Tests:  Recent Labs Lab 10/04/15 0846  AST 24  ALT 18  ALKPHOS 60  BILITOT 0.6  PROT 7.7  ALBUMIN 3.6   No results for input(s): LIPASE, AMYLASE in the last 168 hours.  Recent Labs Lab 10/04/15 0846  AMMONIA 28    CBC:  Recent Labs Lab 10/04/15 0846 10/05/15 0608  WBC 7.1 6.8  NEUTROABS 5.0  --   HGB 11.5* 11.4*  HCT 33.2* 33.7*  MCV 88.3 88.5  PLT 206 212    Cardiac Enzymes: No results for input(s): CKTOTAL, CKMB, CKMBINDEX, TROPONINI in the last 168 hours.  Lipid Panel: No results for input(s): CHOL, TRIG, HDL, CHOLHDL, VLDL, LDLCALC in the last 168 hours.  CBG:  Recent Labs Lab 10/04/15 1645 10/04/15 2013 10/05/15 0737 10/05/15 1139  GLUCAP 198* 257* 168* 238*    Microbiology: Results for orders placed or performed during the hospital encounter of 09/22/15  MRSA PCR Screening     Status: None   Collection Time: 09/22/15  5:03 PM  Result Value Ref Range Status   MRSA by PCR NEGATIVE NEGATIVE Final    Comment:        The GeneXpert MRSA Assay (FDA approved for NASAL specimens only), is one component of a comprehensive MRSA colonization surveillance program. It is not intended to diagnose MRSA infection nor to guide or monitor treatment for MRSA infections.     Coagulation  Studies:  Recent Labs  10/04/15 0930 10/05/15 0608  LABPROT 29.4* 29.6*  INR 2.73 2.74    Imaging: Dg Chest 2 View  Result Date: 10/04/2015 CLINICAL DATA:  Cough and generalized weakness. EXAM: CHEST  2 VIEW COMPARISON:  09/23/2015 and prior radiographs FINDINGS: Cardiomegaly again identified. Mild pulmonary vascular congestion noted. Very mild interstitial opacities bilaterally may represent minimal interstitial edema. Trace bilateral pleural effusions are noted. Mild left basilar atelectasis identified. There is no evidence of pneumothorax. IMPRESSION: Cardiomegaly with pulmonary vascular congestion and possible minimal interstitial pulmonary edema. Trace bilateral pleural effusions. Mild bibasilar atelectasis. Electronically Signed   By: Harmon Pier M.D.   On: 10/04/2015 09:22       Assessment and plan per attending neurologist  Felicie Morn PA-C [facility] [phone]  10/05/2015, 3:16 PM   Assessment/Plan: This is a 82 year old male with significant past medical history of hypertension peripheral neuropathy diabetes end-stage renal disease who has had decreased ability to ambulate over the past few months if not more. Detailed history is unable to be obtained as patient is unable to provide this. He does have significant peripheral neuropathy including decreased proprioception, vibratory sensation, temperature sensation and light touch all the way up to his knees. In addition he has significant bony deformities of bilateral feet. At this time would evaluate as an outpatient for possible metabolic etiologies for peripheral neuropathy including B12, folate,  RPR. As noted by renal he has extensive chronic degenerative changes in the cervical spine, although he has no reflexes in lower extremity this may be secondary to his significant peripheral neuropathy. I agree repeat MRI of cervical spine would be full. However, given his multiple comorbidities I do not know how much of  a surgical candidate that he would be. He will likely need outpatient physical therapy. Would also recommend the patient be followed as an outpatient with neurology as this does not appear to be an acute process.  Case discussed with Dr.Shikhman.

## 2015-10-05 NOTE — Care Management Obs Status (Signed)
MEDICARE OBSERVATION STATUS NOTIFICATION   Patient Details  Name: [name] [name] MRN: [id number] Date of Birth: April 30, 1933   Medicare Observation Status Notification Given:  Yes    Antony HasteBennett, Monicia Tse Harris, RN 10/05/2015, 8:08 PM

## 2015-10-05 NOTE — Progress Notes (Signed)
Nutrition Brief Note  Patient identified on the Malnutrition Screening Tool (MST) Report  Patient reports his UBW is around 220 lbs and that he has not noticed any weight loss recently. Also reports appetite is good. He reports eating 3 meals/day plus snacks and that he finishes 100% of meals. He does reports that since about 4 months ago he has been unable to walk due to weakness in legs. Dry weight per chart is 97 kg. Continue Nepro BID, snacks, and Rena-Vit.  Wt Readings from Last 15 Encounters:  10/05/15 228 lb 13.4 oz (103.8 kg)  09/24/15 214 lb 11.7 oz (97.4 kg)  08/31/15 229 lb 6.4 oz (104.1 kg)  08/15/15 227 lb 3.2 oz (103.1 kg)  06/21/15 218 lb 0.9 oz (98.9 kg)  06/08/15 220 lb 1.9 oz (99.8 kg)  04/23/15 214 lb 1.1 oz (97.1 kg)  04/16/15 192 lb 14.4 oz (87.5 kg)  04/08/15 227 lb 11.8 oz (103.3 kg)  04/01/15 225 lb (102.1 kg)  12/08/14 232 lb 4.8 oz (105.4 kg)  10/29/14 234 lb (106.1 kg)  07/28/14 231 lb (104.8 kg)  07/10/14 225 lb (102.1 kg)  04/30/14 230 lb 1.9 oz (104.4 kg)    Body mass index is 27.85 kg/m. Patient meets criteria for overweight based on current BMI.   Current diet order is Renal with 1.2 L fluid restriction, patient is consuming approximately 100% of meals at this time. Labs and medications reviewed.   No nutrition interventions warranted at this time. If nutrition issues arise, please consult RD.   Helane RimaLeanne Jhade Berko, MS, RD, LDN

## 2015-10-05 NOTE — Evaluation (Signed)
Physical Therapy Evaluation Patient Details Name: [name] [name] MRN: [id number] DOB: 12-31-1933 Today's Date: 10/05/2015   History of Present Illness  [name] [name] is a 82 y.o. male with medical history significant for ESRD on hemodialysis Monday Wednesday and Friday, HTN diabetes, atrial fibrillation with RVR, with multiple admissions, last on 08/12/2015, previous history of PA F , s/p ablation for PSVT, presenting today to the emergency department with generalized weakness and 1 week history of progressive dyspnea, accompanied by productive frothy cough.  Clinical Impression   Pt admitted with above diagnosis. Pt currently with functional limitations due to the deficits listed below (see PT Problem List).  Pt will benefit from skilled PT to increase their independence and safety with mobility to allow discharge to the venue listed below.       Follow Up Recommendations SNF    Equipment Recommendations  Rolling walker with 5" wheels;3in1 (PT)    Recommendations for Other Services       Precautions / Restrictions Precautions Precautions: Fall Restrictions Weight Bearing Restrictions: No      Mobility  Bed Mobility Overal bed mobility: Needs Assistance Bed Mobility: Supine to Sit;Sit to Supine     Supine to sit: Mod assist;Max assist;+2 for physical assistance;+2 for safety/equipment Sit to supine: Mod assist;Max assist;+2 for physical assistance;+2 for safety/equipment   General bed mobility comments: Mod assist to roll with cues for technique and use of rails; max assist to elevate trunk to sit  Transfers Overall transfer level: Needs assistance Equipment used: 2 person hand held assist Transfers: Sit to/from [employer] Sit to Stand: Mod assist;Max assist;+2 physical assistance;+2 safety/equipment;From elevated surface Stand pivot transfers: Mod assist;Max assist;+2 physical assistance;+2 safety/equipment       General transfer comment: +2 assist  to stand, did not reach fully upright standing; knees blocked for stability during transition to chair; third person to hold chair  Ambulation/Gait                Stairs            Wheelchair Mobility    Modified Rankin (Stroke Patients Only)       Balance     Sitting balance-Leahy Scale: Fair       Standing balance-Leahy Scale: Zero                               Pertinent Vitals/Pain Pain Assessment: No/denies pain    Home Living Family/patient expects to be discharged to:: Unsure Living Arrangements: Spouse/significant other Available Help at Discharge: Family Type of Home: House Home Access: Stairs to enter Entrance Stairs-Rails: None Entrance Stairs-Number of Steps: 1 Home Layout: One level Home Equipment: Cane - single point;Walker - 2 wheels Additional Comments: information taken from recent admission in August 2017; pt unable to provide this information this date    Prior Function Level of Independence: Needs assistance   Gait / Transfers Assistance Needed: Uses RW  ADL's / Homemaking Assistance Needed: wife assists with bathing and dressing  Comments: information taken from recent admission in August 2017; pt unable to provide this information this date     Hand Dominance   Dominant Hand: Right    Extremity/Trunk Assessment   Upper Extremity Assessment: Defer to OT evaluation           Lower Extremity Assessment: Generalized weakness         Communication   Communication: Other (comment) (speech difficult to  understand)  Cognition Arousal/Alertness: Awake/alert Behavior During Therapy: WFL for tasks assessed/performed Overall Cognitive Status: No family/caregiver present to determine baseline cognitive functioning                      General Comments      Exercises        Assessment/Plan    PT Assessment Patient needs continued PT services  PT Diagnosis Difficulty walking;Generalized weakness    PT Problem List Decreased strength;Decreased activity tolerance;Decreased balance;Decreased mobility;Decreased coordination;Decreased cognition;Decreased knowledge of use of DME;Decreased safety awareness;Decreased knowledge of precautions  PT Treatment Interventions DME instruction;Gait training;Stair training;Functional mobility training;Therapeutic activities;Therapeutic exercise;Balance training;Neuromuscular re-education;Patient/family education   PT Goals (Current goals can be found in the Care Plan section) Acute Rehab PT Goals Patient Stated Goal: none stated PT Goal Formulation: Patient unable to participate in goal setting Time For Goal Achievement: 10/19/15 Potential to Achieve Goals: Good    Frequency Min 3X/week   Barriers to discharge        Co-evaluation PT/OT/SLP Co-Evaluation/Treatment: Yes Reason for Co-Treatment: Complexity of the patient's impairments (multi-system involvement);For patient/therapist safety PT goals addressed during session: Mobility/safety with mobility OT goals addressed during session: ADL's and self-care       End of Session Equipment Utilized During Treatment: Gait belt Activity Tolerance: Patient limited by fatigue Patient left: in chair;with call bell/phone within reach;with chair alarm set Nurse Communication: Mobility status    Functional Assessment Tool Used: Clinical Judgement Functional Limitation: Mobility: Walking and moving around Mobility: Walking and Moving Around Current Status ([id number]): At least 80 percent but less than 100 percent impaired, limited or restricted Mobility: Walking and Moving Around Goal Status [phone]([id number]): At least 20 percent but less than 40 percent impaired, limited or restricted    Time: [id number] PT Time Calculation (min) (ACUTE ONLY): 29 min   Charges:   PT Evaluation $PT Eval Moderate Complexity: 1 Procedure     PT G Codes:   PT G-Codes **NOT FOR INPATIENT CLASS** Functional Assessment Tool Used:  Clinical Judgement Functional Limitation: Mobility: Walking and moving around Mobility: Walking and Moving Around Current Status ([id number]): At least 80 percent but less than 100 percent impaired, limited or restricted Mobility: Walking and Moving Around Goal Status [phone]): At least 20 percent but less than 40 percent impaired, limited or restricted    Van ClinesGarrigan, Esmerelda Finnigan Hamff 10/05/2015, 4:08 PM  Van ClinesHolly Susanna Benge, PT  Acute Rehabilitation Services Pager [phone] Office [phone]

## 2015-10-05 NOTE — Plan of Care (Signed)
Problem: Education: Goal: Knowledge of [facility] Education information/materials will improve Outcome: Progressing RN provided medication education to patient on all medications administered thus far this shift.  Patient stated understanding.  Patient has denied pain thus far this shift.

## 2015-10-05 NOTE — Progress Notes (Signed)
PROGRESS NOTE                                                                                                                                                                                                             Patient Demographics:    [name] [name], is a 82 y.o. male, DOB - February 05, 1933, ZOX:[id number]  Admit date - 10/04/2015   Admitting Physician Ozella Rocks, MD  Outpatient Primary MD for the patient is Katy Apo, MD  LOS - 0  Chief Complaint  Patient presents with  . Fatigue       Brief Narrative    [name] [name] is a 82 y.o. male with medical history significant for ESRD on hemodialysis Monday Wednesday and Friday, HTN diabetes, atrial fibrillation with RVR, with multiple admissions, last on 08/12/2015, previous history of PA F , s/p ablation for PSVT, presenting today to the emergency department with generalized weakness and 1 week history of progressive dyspnea, accompanied by productive frothy cough. He denies rhinorrhea or hemoptysis. Nonsmoker, and the patient is not on home oxygen. On EMS arrived, the patient was hypoxic, at 87%, placed on 2 L with some relief. He denies fevers, chills, chest pain or palpitations, nausea vomiting or abdominal pain. HEENT reports decreased appetite. He makesminimal urine. He denies any lower extremities swelling . He denies any recent infections. No sick contacts. Denies any recent distance travel.  He denies any syncope, presyncope or vertigo. No changes in his meds and he is compliant. LAst dialysis was on Monday    Subjective:    [name] [name] today has, No headache, No chest pain, No abdominal pain - No Nausea, No new weakness tingling or numbness, No Cough - SOB.     Assessment  & Plan :     1. Chronic diastolic CHF with EF 60% on echocardiogram done 4 weeks ago. Patient has ESRD, nephrology on board, being dialyzed for fluid removal, Compensated this  admission.  2. ESRD, Monday, Wednesday and Friday schedule. Renal following.  3. Chronic atrial fibrillation [place] vasc 2 score of 5. Patient on Coumadin, had to reduce beta blocker and Cardizem due to low heart rate, monitor heart rate and blood pressure. Pharmacy monitoring Coumadin.  4. Anemia of chronic disease. Stable no acute issues.  5. Essential hypertension. Monitor blood pressure on reduced Cardizem and beta  blocker dose.  6. Generalized weakness, difficulty in ambulating. Chronic gradually progressive problem, no back pain or focal deficits, MRI L-spine ordered upon admission to rule out spinal stenosis will be completed, PT eval, may require placement.   7. DM type II. On sliding scale and monitor.  Lab Results  Component Value Date   HGBA1C 8.8 (H) 09/22/2015   CBG (last 3)   Recent Labs  10/04/15 1645 10/04/15 2013 10/05/15 0737  GLUCAP 198* 257* 168*      Family Communication  :  Wife  Code Status :  DNR  Diet : Renal  Disposition Plan  :  TBD  Consults  :  Renal  Procedures  :    DVT Prophylaxis  :  Coumadin  Lab Results  Component Value Date   INR 2.74 10/05/2015   INR 2.73 10/04/2015   INR 2.1 09/30/2015     Lab Results  Component Value Date   PLT 212 10/05/2015    Inpatient Medications  Scheduled Meds: . atorvastatin  10 mg Oral q1800  . calcium acetate  667 mg Oral TID WC  . cinacalcet  60 mg Oral QHS  . diltiazem  360 mg Oral Daily  . feeding supplement (NEPRO CARB STEADY)  237 mL Oral BID BM  . heparin  4,000 Units Dialysis Once in dialysis  . insulin aspart  0-5 Units Subcutaneous QHS  . insulin aspart  0-9 Units Subcutaneous TID WC  . latanoprost  1 drop Right Eye QHS  . LORazepam  1 mg Intravenous Once  . metoprolol  50 mg Oral BID  . multivitamin  1 tablet Oral QHS  . neomycin-polymyxin b-dexamethasone  1 drop Left Eye QID  . prednisoLONE acetate  1 drop Left Eye BID  . Warfarin - Pharmacist Dosing Inpatient   Does not  apply q1800   Continuous Infusions:   PRN Meds:.sodium chloride, sodium chloride, acetaminophen **OR** acetaminophen, ALPRAZolam, alteplase, calcium acetate, heparin, lidocaine (PF), lidocaine-prilocaine, lidocaine-prilocaine, oxyCODONE, pentafluoroprop-tetrafluoroeth, promethazine  Antibiotics  :    Anti-infectives    None         Objective:   Vitals:   10/04/15 1530 10/04/15 2022 10/05/15 0542 10/05/15 0912  BP: (!) 164/79 (!) 168/95 123/66 (!) 121/53  Pulse: 60 62 (!) 50 (!) 46  Resp: 18 19 20 18   Temp: 98.4 F (36.9 C) 98.4 F (36.9 C) 98.6 F (37 C) 98 F (36.7 C)  TempSrc: Oral Oral Oral Oral  SpO2: 100% 98% 97% 98%  Weight: 102.9 kg (226 lb 12.8 oz) 103.7 kg (228 lb 9.9 oz)    Height: 6\' 4"  (1.93 m)       Wt Readings from Last 3 Encounters:  10/04/15 103.7 kg (228 lb 9.9 oz)  09/24/15 97.4 kg (214 lb 11.7 oz)  08/31/15 104.1 kg (229 lb 6.4 oz)     Intake/Output Summary (Last 24 hours) at 10/05/15 0927 Last data filed at 10/05/15 0200  Gross per 24 hour  Intake              240 ml  Output                0 ml  Net              240 ml     Physical Exam  Awake, mildly confused, Oriented X 2, No new F.N deficits, Normal affect,  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  Symmetrical Chest wall  movement, Good air movement bilaterally, CTAB RRR,No Gallops,Rubs or new Murmurs, No Parasternal Heave +ve B.Sounds, Abd Soft, No tenderness, No organomegaly appriciated, No rebound - guarding or rigidity. No Cyanosis, Clubbing or edema, No new Rash or bruise      Data Review:    CBC  Recent Labs Lab 10/04/15 0846 10/05/15 0608  WBC 7.1 6.8  HGB 11.5* 11.4*  HCT 33.2* 33.7*  PLT 206 212  MCV 88.3 88.5  MCH 30.6 29.9  MCHC 34.6 33.8  RDW 15.9* 15.9*  LYMPHSABS 1.3  --   MONOABS 0.6  --   EOSABS 0.1  --   BASOSABS 0.0  --     Chemistries   Recent Labs Lab 10/04/15 0846  10/04/15 1830 10/05/15 0608  NA 129*  --  128*  K 4.7  --  4.9  CL 88*  --  87*  CO2 26  --  23  GLUCOSE 228*  --  171*  BUN 64*  --  77*  CREATININE 12.63* 13.35* 13.89*  CALCIUM 9.8  --  9.6  MG 2.8*  --   --   AST 24  --   --   ALT 18  --   --   ALKPHOS 60  --   --   BILITOT 0.6  --   --    ------------------------------------------------------------------------------------------------------------------ No results for input(s): CHOL, HDL, LDLCALC, TRIG, CHOLHDL, LDLDIRECT in the last 72 hours.  Lab Results  Component Value Date   HGBA1C 8.8 (H) 09/22/2015   ------------------------------------------------------------------------------------------------------------------ No results for input(s): TSH, T4TOTAL, T3FREE, THYROIDAB in the last 72 hours.  Invalid input(s): FREET3 ------------------------------------------------------------------------------------------------------------------ No results for input(s): VITAMINB12, FOLATE, FERRITIN, TIBC, IRON, RETICCTPCT in the last 72 hours.  Coagulation profile  Recent Labs Lab 09/30/15 10/04/15 0930 10/05/15 0608  INR 2.1 2.73 2.74    No results for input(s): DDIMER in the last 72 hours.  Cardiac Enzymes No results for input(s): CKMB, TROPONINI, MYOGLOBIN in the last 168 hours.  Invalid input(s): CK ------------------------------------------------------------------------------------------------------------------    Component Value Date/Time   BNP 568.9 (H) 09/22/2015 7829    Micro Results No results found for this or any previous visit (from the past 240 hour(s)).  Radiology Reports Dg Chest 2 View  Result Date: 10/04/2015 CLINICAL DATA:  Cough and generalized weakness. EXAM: CHEST  2 VIEW COMPARISON:  09/23/2015 and prior radiographs FINDINGS: Cardiomegaly again identified. Mild pulmonary vascular congestion noted. Very mild interstitial opacities bilaterally may represent minimal interstitial edema. Trace  bilateral pleural effusions are noted. Mild left basilar atelectasis identified. There is no evidence of pneumothorax. IMPRESSION: Cardiomegaly with pulmonary vascular congestion and possible minimal interstitial pulmonary edema. Trace bilateral pleural effusions. Mild bibasilar atelectasis. Electronically Signed   By: Harmon Pier M.D.   On: 10/04/2015 09:22   Dg Chest 2 View  Result Date: 09/23/2015 CLINICAL DATA:  Chronic cough, history of atrial flutter, diabetes, end-stage renal disease, suspect fluid overload. EXAM: CHEST  2 VIEW COMPARISON:  PA and lateral chest x-ray of September 22, 2015 FINDINGS: The lungs are slightly less well inflated today. The interstitial markings remain increased. The cardiac silhouette remains enlarged. The central pulmonary vascularity is engorged but stable. No significant pleural effusion is observed. The bony thorax is unremarkable. IMPRESSION: Mild pulmonary interstitial edema secondary to CHF which has improved since yesterday's study. Electronically Signed   By: David  [place] M.D.   On: 09/23/2015 07:21   Dg Chest 2 View  Result Date: 09/22/2015 CLINICAL DATA:  Chronic renal  failure with weakness. History anemia and cardiac arrhythmia EXAM: CHEST  2 VIEW COMPARISON:  August 12, 2015 FINDINGS: There is moderate generalized interstitial edema. There is a small right pleural effusion. There is cardiomegaly with pulmonary venous hypertension. No adenopathy. No appreciable airspace consolidation. IMPRESSION: Evidence of a degree of congestive heart failure without airspace consolidation. Electronically Signed   By: Bretta BangWilliam  Woodruff III M.D.   On: 09/22/2015 08:54    Time Spent in minutes  30   Tymika Grilli K M.D on 10/05/2015 at 9:27 AM  Between 7am to 7pm - Pager - [phone]  After 7pm go to [email or web address] - password [name] [name]  [facility] -  Office  [phone]

## 2015-10-05 NOTE — Plan of Care (Signed)
Problem: Safety: Goal: Ability to remain free from injury will improve Outcome: Progressing RN instructed patient to call and wait for staff assistance prior to getting out of bed.  Patient stated understanding and has made no attempts to get out of bed thus far this shift.  Safe environment being provided this shift per staff.

## 2015-10-06 DIAGNOSIS — N186 End stage renal disease: Secondary | ICD-10-CM | POA: Diagnosis not present

## 2015-10-06 DIAGNOSIS — R5383 Other fatigue: Secondary | ICD-10-CM | POA: Diagnosis not present

## 2015-10-06 DIAGNOSIS — I132 Hypertensive heart and chronic kidney disease with heart failure and with stage 5 chronic kidney disease, or end stage renal disease: Secondary | ICD-10-CM | POA: Diagnosis not present

## 2015-10-06 LAB — CBC
HEMATOCRIT: 33.6 % — AB (ref 39.0–52.0)
HEMOGLOBIN: 10.9 g/dL — AB (ref 13.0–17.0)
MCH: 29.3 pg (ref 26.0–34.0)
MCHC: 32.4 g/dL (ref 30.0–36.0)
MCV: 90.3 fL (ref 78.0–100.0)
Platelets: 178 10*3/uL (ref 150–400)
RBC: 3.72 MIL/uL — ABNORMAL LOW (ref 4.22–5.81)
RDW: 16 % — AB (ref 11.5–15.5)
WBC: 5.6 10*3/uL (ref 4.0–10.5)

## 2015-10-06 LAB — RPR: RPR: NONREACTIVE

## 2015-10-06 LAB — RENAL FUNCTION PANEL
ANION GAP: 13 (ref 5–15)
Albumin: 3.3 g/dL — ABNORMAL LOW (ref 3.5–5.0)
BUN: 49 mg/dL — AB (ref 6–20)
CHLORIDE: 92 mmol/L — AB (ref 101–111)
CO2: 25 mmol/L (ref 22–32)
Calcium: 9.2 mg/dL (ref 8.9–10.3)
Creatinine, Ser: 10.77 mg/dL — ABNORMAL HIGH (ref 0.61–1.24)
GFR calc Af Amer: 5 mL/min — ABNORMAL LOW (ref >60)
GFR, EST NON AFRICAN AMERICAN: 4 mL/min — AB (ref >60)
Glucose, Bld: 274 mg/dL — ABNORMAL HIGH (ref 65–99)
PHOSPHORUS: 6.5 mg/dL — AB (ref 2.5–4.6)
POTASSIUM: 3.9 mmol/L (ref 3.5–5.1)
Sodium: 130 mmol/L — ABNORMAL LOW (ref 135–145)

## 2015-10-06 LAB — GLUCOSE, CAPILLARY
GLUCOSE-CAPILLARY: 154 mg/dL — AB (ref 65–99)
GLUCOSE-CAPILLARY: 152 mg/dL — AB (ref 65–99)
Glucose-Capillary: 167 mg/dL — ABNORMAL HIGH (ref 65–99)
Glucose-Capillary: 292 mg/dL — ABNORMAL HIGH (ref 65–99)
Glucose-Capillary: 307 mg/dL — ABNORMAL HIGH (ref 65–99)

## 2015-10-06 LAB — PROTIME-INR
INR: 3.19
PROTHROMBIN TIME: 33.4 s — AB (ref 11.4–15.2)

## 2015-10-06 MED ORDER — SODIUM CHLORIDE 0.9 % IV SOLN: 100.0000 mL | INTRAVENOUS | Status: DC | PRN

## 2015-10-06 MED ORDER — LIDOCAINE HCL (PF) 1 % IJ SOLN: 5.0000 mL | INTRAMUSCULAR | Status: DC | PRN

## 2015-10-06 MED ORDER — LIDOCAINE-PRILOCAINE 2.5-2.5 % EX CREA: 1.0000 "application " | TOPICAL_CREAM | CUTANEOUS | Status: DC | PRN

## 2015-10-06 MED ORDER — PENTAFLUOROPROP-TETRAFLUOROETH EX AERO: 1.0000 "application " | INHALATION_SPRAY | CUTANEOUS | Status: DC | PRN

## 2015-10-06 MED ORDER — HEPARIN SODIUM (PORCINE) 1000 UNIT/ML DIALYSIS: 4000.0000 [IU] | Freq: Once | INTRAMUSCULAR | Status: DC

## 2015-10-06 MED ORDER — HEPARIN SODIUM (PORCINE) 1000 UNIT/ML DIALYSIS: 1000.0000 [IU] | INTRAMUSCULAR | Status: DC | PRN

## 2015-10-06 MED ORDER — WARFARIN SODIUM 5 MG PO TABS
5.0000 mg | ORAL_TABLET | Freq: Once | ORAL | Status: AC
  Administered 2015-10-06: 5 mg via ORAL
  Filled 2015-10-06 (×2): qty 1

## 2015-10-06 NOTE — Care Management Obs Status (Signed)
MEDICARE OBSERVATION STATUS NOTIFICATION   Patient Details  Name: [name] ErmRichard G Mortell MRN: [id number] Date of Birth: 1933-04-29   Medicare Observation Status Notification Given:  Yes    Damyiah Moxley, Annamarie MajorCheryl U, RN 10/06/2015, 3:15 PM

## 2015-10-06 NOTE — Progress Notes (Signed)
ANTICOAGULATION CONSULT NOTE - Follow up  Pharmacy Consult for Coumadin Indication: atrial fibrillation  Allergies  Allergen Reactions  . Diltiazem Other (See Comments)    SEVERE STOMACH CRAMPING PER PT    Patient Measurements: Height: 6\' 4"  (193 cm) Weight: 222 lb 14.2 oz (101.1 kg) IBW/kg (Calculated) : 86.8  Vital Signs: Temp: 97.5 F (36.4 C) (09/06 1148) Temp Source: Oral (09/06 1148) BP: 114/69 (09/06 1245) Pulse Rate: 67 (09/06 1245)  Labs:  Recent Labs  10/04/15 0846 10/04/15 0930 10/04/15 1830 10/05/15 0608 10/06/15 0425 10/06/15 1154  HGB 11.5*  --   --  11.4*  --  10.9*  HCT 33.2*  --   --  33.7*  --  33.6*  PLT 206  --   --  212  --  178  LABPROT  --  29.4*  --  29.6* 33.4*  --   INR  --  2.73  --  2.74 3.19  --   CREATININE 12.63*  --  13.35* 13.89*  --  10.77*    Estimated Creatinine Clearance: 6.7 mL/min (by C-G formula based on SCr of 10.77 mg/dL).   Medical History: Past Medical History:  Diagnosis Date  . Anemia   . Atrial flutter (HCC)   . Bradycardia    a. unable to tolerate beta blockers in the past.  . Diabetes mellitus (HCC)   . ESRD (end stage renal disease) on dialysis [facility](HCC)    "MWF; [address]" (04/08/2015)  . Glaucoma   . Gout   . Hypertension   . PAF (paroxysmal atrial fibrillation) (HCC)   . Peripheral neuropathy (HCC)   . PSVT (paroxysmal supraventricular tachycardia) (HCC)    a. h/o ablation.    Assessment: 82 y/o M with PMH significant anemia, Afib/aflutter, DM, ESRD on HD MWF, glaucoma, gout, HTN, peripheral neuropathy, CHF, and PSVT presented on 10/04/15 with inability to walk and general weakness.  Anticoag: h/o afib/flutter on Coumadin PTA. INR 2.73 on admit date 10/04/15.  Today the INR has increased to 3.19, just above goal on continued PTA dosage 7.5 mg daily.  - Anemia of chronic dz with admit Hgb 11.5, now 10.9 today.  FOB was negative on 10/04/15. No bleeding noted. Appetite-  RN reports patient was  eating breakfast this AM (charting for meals eaten may not be accurate)  Goal of Therapy:  INR 2-3 Monitor platelets by anticoagulation protocol: Yes   Plan:  Give warfarin 5mg  today x1 Daily INR   Noah Delaineuth Skye Rodarte, RPh Clinical Pharmacist Pager: [phone] 10/06/2015,1:05 PM

## 2015-10-06 NOTE — Progress Notes (Signed)
PROGRESS NOTE                                                                                                                                                                                                             Patient Demographics:    [name] [name], is a 82 y.o. male, DOB - Oct 03, 1933, WUJ:[id number]  Admit date - 10/04/2015   Admitting Physician Ozella Rocks, MD  Outpatient Primary MD for the patient is Katy Apo, MD  LOS - 1  Chief Complaint  Patient presents with  . Fatigue       Brief Narrative    [name] [name] is a 82 y.o. male with medical history significant for ESRD on hemodialysis Monday Wednesday and Friday, HTN diabetes, atrial fibrillation with RVR, with multiple admissions, last on 08/12/2015, previous history of PA F , s/p ablation for PSVT, presenting today to the emergency department with generalized weakness and 1 week history of progressive dyspnea, accompanied by productive frothy cough. He denies rhinorrhea or hemoptysis. Nonsmoker, and the patient is not on home oxygen. On EMS arrived, the patient was hypoxic, at 87%, placed on 2 L with some relief. He denies fevers, chills, chest pain or palpitations, nausea vomiting or abdominal pain. HEENT reports decreased appetite. He makesminimal urine. He denies any lower extremities swelling . He denies any recent infections. No sick contacts. Denies any recent distance travel.  He denies any syncope, presyncope or vertigo. No changes in his meds and he is compliant.    Subjective:    [name] today has, No headache, No chest pain, No abdominal pain - No Nausea, No new weakness tingling or numbness, No Cough - SOB.     Assessment  & Plan :     1. Chronic diastolic CHF with EF 60% on echocardiogram done 4 weeks ago. Patient has ESRD, nephrology on board, being dialyzed for fluid removal, Compensated this admission.  2. ESRD, Monday,  Wednesday and Friday schedule. Renal following.  3. Chronic atrial fibrillation [place] vasc 2 score of 5. Patient on Coumadin, had to reduce beta blocker and Cardizem due to low heart rate, monitor heart rate and blood pressure. Pharmacy monitoring Coumadin.  4. Anemia of chronic disease. Stable no acute issues.  5. Essential hypertension. Monitor blood pressure on reduced Cardizem and beta blocker dose.  6. Generalized  weakness, difficulty in ambulating. Unable able to stand up with 2 person assist, family unable to take home, Chronic gradually progressive problem, no back pain or focal deficits, MRI C & L-spine noted with severe DJD and C spine central disc protrusion, Neuro and N. Surg called, likely poor surgical candidate, will request neurosurgery to provide their input which I will discuss with family and patient.   7. DM type II. On sliding scale and monitor.  Lab Results  Component Value Date   HGBA1C 8.8 (H) 09/22/2015   CBG (last 3)   Recent Labs  10/05/15 1749 10/05/15 2057 10/06/15 0742  GLUCAP 174* 311* 167*      Family Communication  :  Wife  Code Status :  DNR  Diet : Renal  Disposition Plan  :  TBD  Consults  :  Renal, Neuro, N Surg  Procedures  :    DVT Prophylaxis  :  Coumadin  Lab Results  Component Value Date   INR 3.19 10/06/2015   INR 2.74 10/05/2015   INR 2.73 10/04/2015     Lab Results  Component Value Date   PLT 212 10/05/2015    Inpatient Medications  Scheduled Meds: . atorvastatin  10 mg Oral q1800  . calcium acetate  1,334 mg Oral TID WC  . cinacalcet  60 mg Oral QHS  . diltiazem  300 mg Oral Daily  . dorzolamide-timolol  1 drop Right Eye BID  . feeding supplement (NEPRO CARB STEADY)  237 mL Oral BID BM  . insulin aspart  0-5 Units Subcutaneous QHS  . insulin aspart  0-9 Units Subcutaneous TID WC  . latanoprost  1 drop Right Eye QHS  . LORazepam  1 mg Intravenous Once  . metoprolol  25 mg Oral BID  . multivitamin  1  tablet Oral QHS  . neomycin-polymyxin b-dexamethasone  1 drop Left Eye QID  . prednisoLONE acetate  1 drop Left Eye BID  . Warfarin - Pharmacist Dosing Inpatient   Does not apply q1800   Continuous Infusions:   PRN Meds:.acetaminophen **OR** acetaminophen, ALPRAZolam, calcium acetate, oxyCODONE, promethazine  Antibiotics  :    Anti-infectives    None         Objective:   Vitals:   10/05/15 1824 10/05/15 2058 10/06/15 0522 10/06/15 0811  BP: 138/72 (!) 145/72 139/69 (!) 143/64  Pulse: 68 67 61 64  Resp: 17 18 16 16   Temp: 97.7 F (36.5 C) 98.4 F (36.9 C) 97.5 F (36.4 C) 98.1 F (36.7 C)  TempSrc: Oral Oral Oral Oral  SpO2: 98% 97% 99% 98%  Weight:  102.1 kg (225 lb 1.4 oz)    Height:        Wt Readings from Last 3 Encounters:  10/05/15 102.1 kg (225 lb 1.4 oz)  09/24/15 97.4 kg (214 lb 11.7 oz)  08/31/15 104.1 kg (229 lb 6.4 oz)     Intake/Output Summary (Last 24 hours) at 10/06/15 0818 Last data filed at 10/06/15 6045  Gross per 24 hour  Intake              470 ml  Output             4000 ml  Net            -3530 ml     Physical Exam  Awake, mildly confused, Oriented X 2, No new F.N deficits, Normal affect,  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  Symmetrical Chest wall movement, Good air movement bilaterally, CTAB RRR,No Gallops,Rubs or new Murmurs, No Parasternal Heave +ve B.Sounds, Abd Soft, No tenderness, No organomegaly appriciated, No rebound - guarding or rigidity. No Cyanosis, Clubbing or edema, No new Rash or bruise      Data Review:    CBC  Recent Labs Lab 10/04/15 0846 10/05/15 0608  WBC 7.1 6.8  HGB 11.5* 11.4*  HCT 33.2* 33.7*  PLT 206 212  MCV 88.3 88.5  MCH 30.6 29.9  MCHC 34.6 33.8  RDW 15.9* 15.9*  LYMPHSABS 1.3  --   MONOABS 0.6  --   EOSABS 0.1  --   BASOSABS 0.0  --     Chemistries   Recent Labs Lab 10/04/15 0846 10/04/15 1830 10/05/15 0608    NA 129*  --  128*  K 4.7  --  4.9  CL 88*  --  87*  CO2 26  --  23  GLUCOSE 228*  --  171*  BUN 64*  --  77*  CREATININE 12.63* 13.35* 13.89*  CALCIUM 9.8  --  9.6  MG 2.8*  --   --   AST 24  --   --   ALT 18  --   --   ALKPHOS 60  --   --   BILITOT 0.6  --   --    ------------------------------------------------------------------------------------------------------------------ No results for input(s): CHOL, HDL, LDLCALC, TRIG, CHOLHDL, LDLDIRECT in the last 72 hours.  Lab Results  Component Value Date   HGBA1C 8.8 (H) 09/22/2015   ------------------------------------------------------------------------------------------------------------------  Recent Labs  10/05/15 1747  TSH 2.005   ------------------------------------------------------------------------------------------------------------------  Recent Labs  10/05/15 1747  VITAMINB12 1,287*  FOLATE 48.0    Coagulation profile  Recent Labs Lab 09/30/15 10/04/15 0930 10/05/15 0608 10/06/15 0425  INR 2.1 2.73 2.74 3.19    No results for input(s): DDIMER in the last 72 hours.  Cardiac Enzymes No results for input(s): CKMB, TROPONINI, MYOGLOBIN in the last 168 hours.  Invalid input(s): CK ------------------------------------------------------------------------------------------------------------------    Component Value Date/Time   BNP 568.9 (H) 09/22/2015 5784    Micro Results No results found for this or any previous visit (from the past 240 hour(s)).  Radiology Reports Dg Chest 2 View  Result Date: 10/04/2015 CLINICAL DATA:  Cough and generalized weakness. EXAM: CHEST  2 VIEW COMPARISON:  09/23/2015 and prior radiographs FINDINGS: Cardiomegaly again identified. Mild pulmonary vascular congestion noted. Very mild interstitial opacities bilaterally may represent minimal interstitial edema. Trace bilateral pleural effusions are noted. Mild left basilar atelectasis identified. There is no evidence of  pneumothorax. IMPRESSION: Cardiomegaly with pulmonary vascular congestion and possible minimal interstitial pulmonary edema. Trace bilateral pleural effusions. Mild bibasilar atelectasis. Electronically Signed   By: Harmon Pier M.D.   On: 10/04/2015 09:22   Dg Chest 2 View  Result Date: 09/23/2015 CLINICAL DATA:  Chronic cough, history of atrial flutter, diabetes, end-stage renal disease, suspect fluid overload. EXAM: CHEST  2 VIEW COMPARISON:  PA and lateral chest x-ray of September 22, 2015 FINDINGS: The lungs are slightly less well inflated today. The interstitial markings remain increased. The cardiac silhouette remains enlarged. The central pulmonary vascularity is engorged but stable. No significant pleural effusion is observed. The bony thorax is unremarkable. IMPRESSION: Mild pulmonary interstitial edema secondary to CHF which has improved since yesterday's study. Electronically Signed   By: David  [place] M.D.   On: 09/23/2015 07:21   Dg Chest 2 View  Result Date: 09/22/2015 CLINICAL DATA:  Chronic renal  failure with weakness. History anemia and cardiac arrhythmia EXAM: CHEST  2 VIEW COMPARISON:  August 12, 2015 FINDINGS: There is moderate generalized interstitial edema. There is a small right pleural effusion. There is cardiomegaly with pulmonary venous hypertension. No adenopathy. No appreciable airspace consolidation. IMPRESSION: Evidence of a degree of congestive heart failure without airspace consolidation. Electronically Signed   By: Bretta Bang III M.D.   On: 09/22/2015 08:54   Mr Cervical Spine Wo Contrast  Result Date: 10/05/2015 CLINICAL DATA:  Inability to walk.  General weakness. EXAM: MRI CERVICAL SPINE WITHOUT CONTRAST TECHNIQUE: Multiplanar, multisequence MR imaging of the cervical spine was performed. No intravenous contrast was administered. COMPARISON:  Cervical spine CT 04/04/2015 FINDINGS: Despite efforts by the technologist and patient, motion artifact is present on today's  examination and could not be eliminated. This reduces the sensitivity and specificity of the study. Alignment: Reversal of the normal cervical lordosis. Grade 1 anterolisthesis at C3-C4. Vertebrae: Degenerative endplate signal changes at C4-C7. No evidence of acute compression fracture. No facet edema. Cord: Faint hyperintense T2 weighted signal within the spinal cord at the C3-4 level. Normal cord caliber. No syrinx. Posterior Fossa, vertebral arteries, paraspinal tissues: Visualized posterior fossa is normal. Vertebral artery flow voids are preserved. Normal visualized paraspinal soft tissues. Disc levels: C1-C2: Advanced degenerative change. C2-C3: Small left central disc protrusion. No spinal canal stenosis. No neuroforaminal stenosis. C3-C4: There is a left central disc extrusion with mild superior migration that effaces the ventral thecal sac and mildly flattens the left ventral aspect of the spinal cord. Minimal T2 hyperintensity is seen within the cord at this level. Mild central spinal canal stenosis. Bilateral uncovertebral hypertrophy, worse on the left and left-greater-than-right facet hypertrophy, causing mild right and moderate left neuroforaminal stenosis. C4-C5: Severe disc space loss. In the anterior osteophyte formation and uncovertebral hypertrophy. Mild bilateral facet hypertrophy. Small disc bulge. No spinal canal stenosis. Moderate right and mild left neuroforaminal stenosis. C5-C6: Severe disc space loss with small disc bulge. Anterior osteophyte formation, bilateral uncovertebral hypertrophy and mild bilateral facet hypertrophy. No spinal canal stenosis. Mild bilateral neuroforaminal stenosis. C6-C7: Severe disc space loss with osteophyte formation and bilateral uncovertebral hypertrophy. Small disc bulge. No spinal canal stenosis. Mild bilateral neuroforaminal stenosis. C7-T1: Normal disc space and facet joints. No spinal canal stenosis. No neuroforaminal stenosis. IMPRESSION: 1. Left  central disc extrusion with mild superior migration at C3-4 flattening the left ventral aspect of the spinal cord with associated faint T2 hyperintensity, suggesting a degree of myelomalacia. 2. Two severe degenerative disc disease with advanced height loss from C4-C7 without other spinal canal stenosis. 3. Multilevel mild-to-moderate neural foraminal narrowing. Electronically Signed   By: Deatra Robinson M.D.   On: 10/05/2015 23:29   Mr Lumbar Spine Wo Contrast  Result Date: 10/05/2015 CLINICAL DATA:  Difficulty walking.  Generalized weakness. EXAM: MRI LUMBAR SPINE WITHOUT CONTRAST TECHNIQUE: Multiplanar, multisequence MR imaging of the lumbar spine was performed. No intravenous contrast was administered. COMPARISON:  None. FINDINGS: Segmentation:  Normal Alignment:  Normal Vertebrae: There are degenerative endplate signal changes at L4-L5 and L5-S1. No other focal marrow lesion. No facet edema. Conus medullaris: Extends to the L2 level and appears normal. Paraspinal and other soft tissues: Severe bilateral renal atrophy with multiple T2 hyperintense lesions are likely cysts. Other visualized retroperitoneal soft tissues are normal. Disc levels: T12-L1: Normal disc space and facet joints. No spinal canal stenosis. No neuroforaminal stenosis. L1-L2: Normal disc space and facet joints. No spinal canal stenosis. No neuroforaminal  stenosis. L2-L3: Normal disc space and facet joints. No spinal canal stenosis. No neuroforaminal stenosis. L3-L4: Disc desiccation with small bulge and severe bilateral facet hypertrophy with fluid in the left facet joint. No spinal canal stenosis. Moderate left neuroforaminal stenosis. L4-L5: Disc desiccation with small bulge and severe bilateral facet hypertrophy. Fluid within the left facet joint. Bilateral lateral recess narrowing without central spinal canal stenosis. Moderate right and mild left neuroforaminal stenosis. L5-S1: Disc desiccation with small central disc protrusion.  Severe right and mild left facet hypertrophy. No spinal canal stenosis. Severe right and moderate to severe left neural foraminal stenosis neuroforaminal stenosis. IMPRESSION: 1. Moderate to severe neural foraminal narrowing at L3-S1, worst at right L5-S1. 2. Multilevel severe facet arthrosis with fluid in the facet joints that may indicate a degree of segmental instability. 3. No spinal canal stenosis. Electronically Signed   By: Deatra RobinsonKevin  Herman M.D.   On: 10/05/2015 23:35    Time Spent in minutes  30   SINGH,PRASHANT K M.D on 10/06/2015 at 8:18 AM  Between 7am to 7pm - Pager - [phone]  After 7pm go to [email or web address] - password [email or web address]  [facility] -  Office  [phone]

## 2015-10-06 NOTE — Clinical Social Work Placement (Signed)
   CLINICAL SOCIAL WORK PLACEMENT  NOTE  Date:  10/06/2015  Patient Details  Name: [name] ErmRichard G Soules MRN: [id number] Date of Birth: [birth year]  Clinical Social Work is seeking post-discharge placement for this patient at the Skilled  Nursing Facility level of care (*CSW will initial, date and re-position this form in  chart as items are completed):  No (Wife provided facility preference)   Patient/family provided with [facility] Clinical [facility] Work Department's list of facilities offering this level of care within the geographic area requested by the patient (or if unable, by the patient's family).  Yes   Patient/family informed of their freedom to choose among providers that offer the needed level of care, that participate in Medicare, Medicaid or managed care program needed by the patient, have an available bed and are willing to accept the patient.  No   Patient/family informed of [facility]'s ownership interest in [facility] and [facility], as well as of the fact that they are under no obligation to receive care at these facilities.  PASRR submitted to EDS on       PASRR number received on       Existing PASRR number confirmed on 10/06/15     FL2 transmitted to all facilities in geographic area requested by pt/family on 10/06/15     FL2 transmitted to all facilities within larger geographic area on       Patient informed that his/her managed care company has contracts with or will negotiate with certain facilities, including the following:        Yes   Patient/family informed of bed offers received.  [facility]     Physician recommends and patient chooses bed at      [facility].  Patient to be transferred to facility by       Patient family notified on 10/06/15 of transfer.  Name of family member notified:  Renee RamusBarbara Cunanan - wife     PHYSICIAN Please sign FL2, Please sign DNR     Additional  Comment:    _______________________________________________ Cristobal Goldmannrawford, Annaleigha Woo Bradley, LCSW 10/06/2015, 11:21 AM

## 2015-10-06 NOTE — Clinical Social Work Note (Signed)
Clinical Social Work Assessment  Patient Details  Name: [name] ErmRichard G Deis MRN: [id number] Date of Birth: 14-Aug-1933  Date of referral:  10/06/15               Reason for consult:  Facility Placement                Permission sought to share information with:  Family Supports Permission granted to share information::  No (Wife at the bedside and participated in the conversation)  Name::     Renee RamusBarbara Ruddick  Agency::     Relationship::  Wife  Contact Information:  [phone]  Housing/Transportation Living arrangements for the past 2 months:  Single Family Home Source of Information:  Spouse Patient Interpreter Needed:  None Criminal Activity/Legal Involvement Pertinent to Current Situation/Hospitalization:  No - Comment as needed Significant Relationships:  Spouse Lives with:  Spouse Do you feel safe going back to the place where you live?  No (Wife in agreement with ST rehab for patient prior to returning home) Need for family participation in patient care:  Yes (Comment)  Care giving concerns:  Wife in agreement that patient can benefit from rehab before returning home.   Worker assessment / plan: CSW visited room and talked with patient and wife, who was at the bedside regarding discharge planning and patient's readiness today for discharge to a facility for rehab. Patient was sitting up in bed and was awake and alert. CSW unable to determine if patient fully oriented from conversation. Patient was gruff and at times hard to understand.  Mrs. [name] primarily participated in conversation and is in agreement with [facility] rehab and [facility].  Employment status:  Retired Health and safety inspectornsurance information:  Systems analystManaged Medicare (UHC Medicare) PT Recommendations:  Skilled Nursing Facility Information / Referral to community resources:  Skilled Nursing Facility (Facility list not provided as wife/patient had facility preference)  Patient/Family's Response to care:  Patient/wife expressed no concerns regarding care during hospitalization.  Patient/Family's Understanding of and Emotional Response to Diagnosis, Current Treatment, and Prognosis:  Not discussed.  Emotional Assessment Appearance:  Appears stated age Attitude/Demeanor/Rapport:  Other (Gruff) Affect (typically observed):  Irritable Orientation:  Oriented to Self, Oriented to Place, Oriented to  Time (Not sure if patient oriented to situation from conversation) Alcohol / Substance use:  Never Used Psych involvement (Current and /or in the community):  No (Comment)  Discharge Needs  Concerns to be addressed:  Discharge Planning Concerns Readmission within the last 30 days:  Yes Current discharge risk:  None Barriers to Discharge:  No Barriers Identified   Cristobal GoldmannCrawford, Keeshia Sanderlin Bradley, LCSW 10/06/2015, 11:14 AM

## 2015-10-06 NOTE — [place] FL2 (Signed)
[place] MEDICAID FL2 LEVEL OF CARE SCREENING TOOL     IDENTIFICATION  Patient Name: [name] [name] Birthdate: 1933/05/23 Sex: male Admission Date (Current Location): 10/04/2015  [facility] [place]   Facility and Address:  [facility] [place], [address], [place], [place] [address]      Provider Number: [id number]  Attending Physician Name and Address:  Leroy SeaPrashant K Arlee Bossard, MD  Relative Name and Phone Number:  Renee RamusBarbara Pardo - wife.  Phone #[phone]    Current Level of Care: Hospital Recommended Level of Care: Skilled Nursing Facility Prior Approval Number:    Date Approved/Denied:   PASRR Number: [id number] Dolores Hoose(Eff. 04/22/15)  Discharge Plan: Home    Current Diagnoses: Patient Active Problem List   Diagnosis Date Noted  . Weakness 10/04/2015  . Generalized weakness   . Essential hypertension   . ESRD on dialysis (HCC)   . Congestive heart failure (HCC)   . PAF (paroxysmal atrial fibrillation) (HCC) 04/20/2015  . Encephalopathy 04/20/2015  . Physical deconditioning 04/20/2015  . Diabetes mellitus with complication (HCC) 04/20/2015  . Acute respiratory failure with hypoxia (HCC)   . Atrial flutter with rapid ventricular response (HCC)   . Acute respiratory failure (HCC) 04/12/2015  . HCAP (healthcare-associated pneumonia) 04/12/2015  . Elevated troponin 04/12/2015  . Anemia 04/12/2015  . Bradycardia 11/19/2014  . Encounter for therapeutic drug monitoring 06/16/2014  . Sinus bradycardia-tachycardia syndrome (HCC) 01/11/2012    Class: Chronic  . Atrial flutter (HCC) 01/10/2012  . Blind left eye 01/10/2012  . ESRD (end stage renal disease) (HCC) 01/10/2012  . Atrial fibrillation with RVR (HCC) 01/10/2012  . Hypertension   . Peripheral neuropathy (HCC)   . Diabetes mellitus with ESRD (end-stage renal disease) (HCC)     Orientation RESPIRATION BLADDER Height & Weight     Self, Time, Place  Normal Continent Weight: 225 lb  1.4 oz (102.1 kg) Height:  6\' 4"  (193 cm)  BEHAVIORAL SYMPTOMS/MOOD NEUROLOGICAL BOWEL NUTRITION STATUS      Continent Diet (Renal with fluid restrictions)  AMBULATORY STATUS COMMUNICATION OF NEEDS Skin     Verbally Normal                       Personal Care Assistance Level of Assistance  Bathing, Feeding, Dressing Bathing Assistance: Maximum assistance Feeding assistance: Limited assistance (Assistrance with set-up) Dressing Assistance: Maximum assistance     Functional Limitations Info  Sight, Hearing, Speech Sight Info: Impaired (Blind left eye) Hearing Info: Adequate Speech Info: Impaired (speech difficult to understand at timesn)    SPECIAL CARE FACTORS FREQUENCY  PT (By licensed PT), OT (By licensed OT)     PT Frequency: Evaluated 10/05/15 - a minimum of 3X per week therapy recommended OT Frequency: Evaluated 10/05/15 - a minimum of 2X per week therapy recommended            Contractures Contractures Info: Not present    Additional Factors Info  Code Status, Allergies, Insulin Sliding Scale Code Status Info: DNR Allergies Info: Dilitazem   Insulin Sliding Scale Info: 0-9 Units 3X per day with meals.  0-5 Units at bedtime.       Current Medications (10/06/2015):  This is the current hospital active medication list Current Facility-Administered Medications  Medication Dose Route Frequency Provider Last Rate Last Dose  . acetaminophen (TYLENOL) tablet 650 mg  650 mg Oral Q6H PRN Ozella Rocksavid J Merrell, MD       Or  .  acetaminophen (TYLENOL) suppository 650 mg  650 mg Rectal Q6H PRN Ozella Rocks, MD      . ALPRAZolam Prudy Feeler) tablet 0.5 mg  0.5 mg Oral Once PRN Ozella Rocks, MD      . atorvastatin (LIPITOR) tablet 10 mg  10 mg Oral q1800 Ozella Rocks, MD   10 mg at 10/05/15 1815  . calcium acetate (PHOSLO) capsule 1,334 mg  1,334 mg Oral TID WC Leroy Sea, MD   1,334 mg at 10/06/15 0816  . calcium acetate (PHOSLO) capsule 667 mg  667 mg Oral TID PRN  Leroy Sea, MD      . cinacalcet (SENSIPAR) tablet 60 mg  60 mg Oral QHS Ozella Rocks, MD   60 mg at 10/05/15 2131  . diltiazem (CARDIZEM CD) 24 hr capsule 300 mg  300 mg Oral Daily Leroy Sea, MD      . dorzolamide-timolol (COSOPT) 22.3-6.8 MG/ML ophthalmic solution 1 drop  1 drop Right Eye BID Leroy Sea, MD   1 drop at 10/06/15 1020  . feeding supplement (NEPRO CARB STEADY) liquid 237 mL  237 mL Oral BID BM Ozella Rocks, MD   237 mL at 10/06/15 1047  . insulin aspart (novoLOG) injection 0-5 Units  0-5 Units Subcutaneous QHS Ozella Rocks, MD   4 Units at 10/05/15 2132  . insulin aspart (novoLOG) injection 0-9 Units  0-9 Units Subcutaneous TID WC Ozella Rocks, MD   2 Units at 10/06/15 [phone]  . latanoprost (XALATAN) 0.005 % ophthalmic solution 1 drop  1 drop Right Eye QHS Ozella Rocks, MD   1 drop at 10/05/15 2150  . LORazepam (ATIVAN) injection 1 mg  1 mg Intravenous Once Leroy Sea, MD      . metoprolol tartrate (LOPRESSOR) tablet 25 mg  25 mg Oral BID Leroy Sea, MD   25 mg at 10/05/15 2131  . multivitamin (RENA-VIT) tablet 1 tablet  1 tablet Oral QHS Ozella Rocks, MD   1 tablet at 10/05/15 2131  . neomycin-polymyxin b-dexamethasone (MAXITROL) ophthalmic suspension 1 drop  1 drop Left Eye QID Ozella Rocks, MD   1 drop at 10/06/15 1021  . oxyCODONE (Oxy IR/ROXICODONE) immediate release tablet 5 mg  5 mg Oral Q4H PRN Ozella Rocks, MD   5 mg at 10/04/15 2041  . prednisoLONE acetate (PRED FORTE) 1 % ophthalmic suspension 1 drop  1 drop Left Eye BID Ozella Rocks, MD   1 drop at 10/06/15 1039  . promethazine (PHENERGAN) tablet 12.5 mg  12.5 mg Oral Q6H PRN Ozella Rocks, MD      . Warfarin - Pharmacist Dosing Inpatient   Does not apply q1800 Norva Pavlov, [facility]         Discharge Medications: Please see discharge summary for a list of discharge medications.  Relevant Imaging Results:  Relevant Lab Results:   Additional  Information SS#[id number].  Dialysis MWF - [address] ([place]).  Cristobal Goldmann, LCSW     Leroy Sea M.D on 10/07/2015 at 9:41 AM

## 2015-10-06 NOTE — Care Management (Signed)
10/06/2015 Unable to reach pt wife on 10/05/2015, able to speak with pt and wife this am, OBS status discussed and ABN explained in detail. ABN was signed by the pt wife, Mrs [name] voices understanding of ABN and discussed possible SNF admission. Mrs [name] states that the pt has been in a SNF previously and was greatly improved with his mobility after SNF rehab.   Johny Shockheryl Elbert Polyakov RN MPH, case manager, [phone]

## 2015-10-06 NOTE — Progress Notes (Signed)
[facility] Progress Note   Subjective: no new c/o  Vitals:   10/06/15 1500 10/06/15 1530 10/06/15 1555 10/06/15 1700  BP: 106/65 106/63 115/77 117/71  Pulse: 72 67 65 70  Resp:   18 18  Temp:   97.3 F (36.3 C) 98.1 F (36.7 C)  TempSrc:   Oral Oral  SpO2:   98% 100%  Weight:   97.6 kg (215 lb 2.7 oz)   Height:        Inpatient medications: . atorvastatin  10 mg Oral q1800  . calcium acetate  1,334 mg Oral TID WC  . cinacalcet  60 mg Oral QHS  . diltiazem  300 mg Oral Daily  . dorzolamide-timolol  1 drop Right Eye BID  . feeding supplement (NEPRO CARB STEADY)  237 mL Oral BID BM  . insulin aspart  0-5 Units Subcutaneous QHS  . insulin aspart  0-9 Units Subcutaneous TID WC  . latanoprost  1 drop Right Eye QHS  . LORazepam  1 mg Intravenous Once  . metoprolol  25 mg Oral BID  . multivitamin  1 tablet Oral QHS  . neomycin-polymyxin b-dexamethasone  1 drop Left Eye QID  . prednisoLONE acetate  1 drop Left Eye BID  . warfarin  5 mg Oral ONCE-1800  . Warfarin - Pharmacist Dosing Inpatient   Does not apply q1800     acetaminophen **OR** acetaminophen, ALPRAZolam, calcium acetate, oxyCODONE, promethazine  Exam: Gen blind L eye, no distress, calm and weak , chron ill  No jvd or bruits Chest clear bilat to bases RRR 2/6 SEM  Abd soft ntnd no mass or ascites +bs no hsm MS marked muscle wasting bilat calves especially Intact sensation grossly of UE's and LE's Ext no edema or wounds Neuro responds appropriately, nonfocal LUA AVF +bruit   ECHO July '17 - normal LVEF, normal study CXR 9/4 - mild CHF w IS edema, trace effusions  Dialysis: [facility]  MWF  4h  97kg   Hep 4200  LUA AVF   Assessment: 1.  Gait failure/ gen'd bilat LE weakness - main issue, has DJD cervical/ lumbar spine. W/u per primary in progress 2.  ESRD - HD MWF, goes to all sessions at OP unit  3.  Vol excess - IS edema CXR, 3kg off today, at dry wt now and 100% on RA 4.  MBD - get records,  cont phoslo/ Sensipar 5.  Anemia - get records 6.  Chron afib - on dilt/ MTP and coumadin   Plan - as above   Vinson Moselle MD [facility] pager [phone]    cell [phone] 10/06/2015, 5:04 PM    Recent Labs Lab 10/04/15 0846 10/04/15 1830 10/05/15 0608 10/06/15 1154  NA 129*  --  128* 130*  K 4.7  --  4.9 3.9  CL 88*  --  87* 92*  CO2 26  --  23 25  GLUCOSE 228*  --  171* 274*  BUN 64*  --  77* 49*  CREATININE 12.63* 13.35* 13.89* 10.77*  CALCIUM 9.8  --  9.6 9.2  PHOS  --   --   --  6.5*    Recent Labs Lab 10/04/15 0846 10/06/15 1154  AST 24  --   ALT 18  --   ALKPHOS 60  --   BILITOT 0.6  --   PROT 7.7  --   ALBUMIN 3.6 3.3*    Recent Labs Lab 10/04/15 0846 10/05/15 0608 10/06/15 1154  WBC  7.1 6.8 5.6  NEUTROABS 5.0  --   --   HGB 11.5* 11.4* 10.9*  HCT 33.2* 33.7* 33.6*  MCV 88.3 88.5 90.3  PLT 206 212 178   Iron/TIBC/Ferritin/ %Sat    Component Value Date/Time   IRON 25 (L) 01/27/2010 1327   TIBC 207 (L) 01/27/2010 1327   FERRITIN 262 01/27/2010 1327   IRONPCTSAT 12 (L) 01/27/2010 1327

## 2015-10-07 ENCOUNTER — Observation Stay (HOSPITAL_COMMUNITY): Payer: Medicare Other

## 2015-10-07 DIAGNOSIS — N186 End stage renal disease: Secondary | ICD-10-CM | POA: Diagnosis not present

## 2015-10-07 DIAGNOSIS — R531 Weakness: Secondary | ICD-10-CM | POA: Diagnosis not present

## 2015-10-07 DIAGNOSIS — R5383 Other fatigue: Secondary | ICD-10-CM | POA: Diagnosis not present

## 2015-10-07 DIAGNOSIS — I132 Hypertensive heart and chronic kidney disease with heart failure and with stage 5 chronic kidney disease, or end stage renal disease: Secondary | ICD-10-CM | POA: Diagnosis not present

## 2015-10-07 LAB — GLUCOSE, CAPILLARY
GLUCOSE-CAPILLARY: 275 mg/dL — AB (ref 65–99)
GLUCOSE-CAPILLARY: 233 mg/dL — AB (ref 65–99)
Glucose-Capillary: 195 mg/dL — ABNORMAL HIGH (ref 65–99)

## 2015-10-07 LAB — PROTIME-INR
INR: 2.96
Prothrombin Time: 31.5 seconds — ABNORMAL HIGH (ref 11.4–15.2)

## 2015-10-07 LAB — CK: Total CK: 109 U/L (ref 49–397)

## 2015-10-07 MED ORDER — DEXAMETHASONE 0.5 MG PO TABS
1.0000 mg | ORAL_TABLET | Freq: Once | ORAL | Status: AC
  Administered 2015-10-07: 1 mg via ORAL
  Filled 2015-10-07: qty 2

## 2015-10-07 MED ORDER — DILTIAZEM HCL ER COATED BEADS 180 MG PO CP24
180.0000 mg | ORAL_CAPSULE | Freq: Every day | ORAL | Status: DC
  Filled 2015-10-07: qty 1

## 2015-10-07 MED ORDER — WARFARIN SODIUM 5 MG PO TABS
5.0000 mg | ORAL_TABLET | Freq: Once | ORAL | Status: AC
  Administered 2015-10-07: 5 mg via ORAL
  Filled 2015-10-07: qty 1

## 2015-10-07 NOTE — Progress Notes (Signed)
Inpatient Diabetes Program Recommendations  AACE/ADA: New Consensus Statement on Inpatient Glycemic Control (2015)  Target Ranges:  Prepandial:   less than 140 mg/dL      Peak postprandial:   less than 180 mg/dL (1-2 hours)      Critically ill patients:  140 - 180 mg/dL   Lab Results  Component Value Date   GLUCAP 195 (H) 10/07/2015   HGBA1C 8.8 (H) 09/22/2015    Review of Glycemic Control:  Results for [name], [name] [name] (MRN [id number]) as of 10/07/2015 11:09  Ref. Range 10/06/2015 11:32 10/06/2015 16:28 10/06/2015 19:50 10/06/2015 22:38 10/07/2015 07:32  Glucose-Capillary Latest Ref Range: 65 - 99 mg/dL 295292 (H) 284154 (H) 132307 (H) 152 (H) 195 (H)   Inpatient Diabetes Program Recommendations:    May consider adding Novolog meal coverage 2 units tid with meals (hold if patient eats <50%).  Thanks, Beryl MeagerJenny Darionna Banke, RN, BC-ADM Inpatient Diabetes Coordinator Pager [phone] (8a-5p)

## 2015-10-07 NOTE — Progress Notes (Signed)
PT Cancellation Note  Patient Details Name: [name] [name] MRN: [id number] DOB: 1933/04/08   Cancelled Treatment:    Reason Eval/Treat Not Completed: Patient at procedure or test/unavailable.  Patient out of room in testing.  Will return later today for PT session.   [name], [name] 10/07/2015, 11:31 AM [name], PT, [facility] Acute Rehab Services Pager [phone]

## 2015-10-07 NOTE — Progress Notes (Signed)
Patient is refusing lab draws and vitals this morning. Patient seems very agitated and requested to be left alone. On call NP notified. RN will continue to monitor patient.  Veatrice KellsMahmoud,Dyneshia Baccam I, RN

## 2015-10-07 NOTE — Progress Notes (Signed)
Physical Therapy Treatment Patient Details Name: [name] [name] MRN: [id number] DOB: Jun 07, 1933 Today's Date: 10/07/2015    History of Present Illness [name] [name] is a 82 y.o. male with medical history significant for ESRD on hemodialysis Monday Wednesday and Friday, HTN diabetes, atrial fibrillation with RVR, with multiple admissions, last on 08/12/2015, previous history of PA F , s/p ablation for PSVT, presenting today to the emergency department with generalized weakness and 1 week history of progressive dyspnea, accompanied by productive frothy cough.    PT Comments    Patient making slow gains with mobility.  Follow Up Recommendations  SNF     Equipment Recommendations  Rolling walker with 5" wheels;3in1 (PT)    Recommendations for Other Services       Precautions / Restrictions Precautions Precautions: Fall Restrictions Weight Bearing Restrictions: No    Mobility  Bed Mobility Overal bed mobility: Needs Assistance Bed Mobility: Supine to Sit;Sit to Supine     Supine to sit: Max assist;HOB elevated Sit to supine: Max assist   General bed mobility comments: Verbal cues for technique.  Assist to raise trunk to upright sitting position.  Patient able to assist with scooting hips to EOB.  Worked on sitting balance.  Patient with posterior lean, requiring repeated cues to shift trunk forward.  Patient able to perform LE exercises in sitting with min assist for balance.  Returned to supine with max assist to control trunk and to bring LE's onto bed.  Transfers                    Ambulation/Gait                 Stairs            Wheelchair Mobility    Modified Rankin (Stroke Patients Only)       Balance Overall balance assessment: Needs assistance Sitting-balance support: No upper extremity supported;Feet supported Sitting balance-Leahy Scale: Poor Sitting balance - Comments: Could sit unsupported for several seconds, then slowly loses  balance posteriorly, requiring assist/cuing to correct. Postural control: Posterior lean                          Cognition Arousal/Alertness: Awake/alert Behavior During Therapy: WFL for tasks assessed/performed Overall Cognitive Status: No family/caregiver present to determine baseline cognitive functioning                      Exercises General Exercises - Lower Extremity Long Arc Quad: AROM;Both;5 reps;Seated Hip Flexion/Marching: AROM;Both;5 reps;Seated Toe Raises: AROM;Both;10 reps;Seated Heel Raises: AROM;Both;10 reps;Seated    General Comments        Pertinent Vitals/Pain Pain Assessment: No/denies pain    Home Living                      Prior Function            PT Goals (current goals can now be found in the care plan section) Progress towards PT goals: Progressing toward goals    Frequency  Min 3X/week    PT Plan Current plan remains appropriate    Co-evaluation             End of Session   Activity Tolerance: Patient limited by fatigue Patient left: in bed;with call bell/phone within reach;with bed alarm set     Time: 1356-1418 PT Time Calculation (min) (ACUTE ONLY): 22 min  Charges:  $Therapeutic Activity:  8-22 mins                    G Codes:      [name], Sheyann Sulton H 10/07/2015, 3:45 PM [name], PT, [facility] Acute Rehab Services Pager [phone]

## 2015-10-07 NOTE — Consult Note (Signed)
NEURO HOSPITALIST CONSULT NOTE   Requestig physician: Dr. Thedore Mins   Reason for Consult: LE weakness     HPI:                                                                                                                                          [name] [name] is an 82 y.o. male  with medical history significant of anemia, atrial flutter, diabetes, ESRD on dialysis Monday was a Friday, glaucoma, gout, HTN. Inability to walk and general weakness. Progressive over the last several months with acute worsening over the last couple of days.  Past Medical History:  Diagnosis Date  . Anemia   . Atrial flutter (HCC)   . Bradycardia    a. unable to tolerate beta blockers in the past.  . Diabetes mellitus (HCC)   . ESRD (end stage renal disease) on dialysis [facility])    "MWF; [address]" (04/08/2015)  . Glaucoma   . Gout   . Hypertension   . PAF (paroxysmal atrial fibrillation) (HCC)   . Peripheral neuropathy (HCC)   . PSVT (paroxysmal supraventricular tachycardia) (HCC)    a. h/o ablation.    Past Surgical History:  Procedure Laterality Date  . ABLATION OF DYSRHYTHMIC FOCUS    . SP AV DIALYSIS SHUNT ACCESS EXISTING *L*      Family History  Problem Relation Age of Onset  . Other Mother     bowel obstruction  . Heart failure Father     fluid bluid up  . Alcoholism Brother   . Alcoholism Brother      Social History:  reports that he has never smoked. He has never used smokeless tobacco. He reports that he does not drink alcohol or use drugs.  Allergies  Allergen Reactions  . Diltiazem Other (See Comments)    SEVERE STOMACH CRAMPING - Takes extended release form without problems    MEDICATIONS:                                                                                                                     Prior to Admission:  Prescriptions Prior to Admission  Medication Sig Dispense Refill Last Dose  . B Complex-C-Folic Acid (RENA-VITE PO)  Take 1 tablet by mouth daily.  10/03/2015 at Unknown time  . Calcium Acetate 667 MG TABS Take 1-2 capsules by mouth 3 (three) times daily. 667 mg for snacks, 1334 mg for meals   10/03/2015 at Unknown time  . diltiazem (CARDIZEM CD) 360 MG 24 hr capsule Take 1 capsule (360 mg total) by mouth daily. 30 capsule 6 10/03/2015 at Unknown time  . docusate sodium (COLACE) 100 MG capsule Take 100 mg by mouth 2 (two) times daily as needed for mild constipation or moderate constipation.    10/03/2015 at Unknown time  . dorzolamide-timolol (COSOPT) 22.3-6.8 MG/ML ophthalmic solution Place 1 drop into the right eye 2 (two) times daily.    10/03/2015 at Unknown time  . lidocaine-prilocaine (EMLA) cream Apply 1 application topically as needed (apply to skin near dialysis port before dialysis).    Past Month at Unknown time  . metoprolol (LOPRESSOR) 50 MG tablet Take 1 tablet (50 mg total) by mouth 2 (two) times daily. 60 tablet 6 10/03/2015 at 2000  . neomycin-polymyxin b-dexamethasone (MAXITROL) 3.5-10000-0.1 SUSP Place 1 drop into the left eye 4 (four) times daily.   10/03/2015 at Unknown time  . prednisoLONE acetate (PRED FORTE) 1 % ophthalmic suspension Place 1 drop into the left eye 2 (two) times daily. Patient to use for 37 days. Filled on 03-28-15   10/03/2015 at Unknown time  . SENSIPAR 60 MG tablet Take 60 mg by mouth at bedtime.    10/03/2015 at Unknown time  . tobramycin-dexamethasone (TOBRADEX) ophthalmic solution Place 1 drop into the left eye 4 (four) times daily. For 37 days. Filled 03-31-15   10/03/2015 at Unknown time  . TRAVATAN Z 0.004 % SOLN ophthalmic solution Place 1 drop into the right eye at bedtime.    10/03/2015 at Unknown time  . warfarin (COUMADIN) 5 MG tablet Take as directed by [facility] (Patient taking differently: Take 7.5 mg by mouth See admin instructions. 7.5 mg daily) 50 tablet 3 10/03/2015 at Unknown time  . acetaminophen (TYLENOL) 325 MG tablet Take 650 mg by mouth daily as needed for mild pain.    unkn  . atorvastatin (LIPITOR) 10 MG tablet Take 1 tablet (10 mg total) by mouth daily at 6 PM. 30 tablet 6 09/21/2015 at 6p   Scheduled: . atorvastatin  10 mg Oral q1800  . calcium acetate  1,334 mg Oral TID WC  . cinacalcet  60 mg Oral QHS  . dexamethasone  1 mg Oral Once  . [START ON 10/08/2015] diltiazem  180 mg Oral Daily  . dorzolamide-timolol  1 drop Right Eye BID  . feeding supplement (NEPRO CARB STEADY)  237 mL Oral BID BM  . insulin aspart  0-5 Units Subcutaneous QHS  . insulin aspart  0-9 Units Subcutaneous TID WC  . latanoprost  1 drop Right Eye QHS  . LORazepam  1 mg Intravenous Once  . metoprolol  25 mg Oral BID  . multivitamin  1 tablet Oral QHS  . neomycin-polymyxin b-dexamethasone  1 drop Left Eye QID  . prednisoLONE acetate  1 drop Left Eye BID  . warfarin  5 mg Oral ONCE-1800  . Warfarin - Pharmacist Dosing Inpatient   Does not apply q1800     ROS:  History obtained from unobtainable from patient due to mental status    Blood pressure (!) 108/59, pulse 61, temperature 98.5 F (36.9 C), temperature source Oral, resp. rate 17, height 6\' 4"  (1.93 m), weight 97.6 kg (215 lb 2.7 oz), SpO2 97 %.   Neurologic Examination:                                                                                                      HEENT-  Normocephalic, no lesions, without obvious abnormality.  Normal external eye and conjunctiva.  Normal TM's bilaterally.  Normal auditory canals and external ears. Normal external nose, mucus membranes and septum.  Normal pharynx. Cardiovascular- S1, S2 normal, pulses palpable throughout   Lungs- chest clear, no wheezing, rales, normal symmetric air entry Abdomen- normal findings: bowel sounds normal Extremities- no edema Lymph-no adenopathy palpable Musculoskeletal-no joint tenderness, deformity or  swelling Skin-warm and dry, no hyperpigmentation, vitiligo, or suspicious lesions  Neurological Examination Mental Status: Alert oriented x 3, follows all commands, very good insight about his care Cranial Nerves: II:  Visual fields grossly normal in the right eye. Left eye has severe's scleral scarring and he barely can open his eyelids. Right eyes pupil is a 2 mm and reactive.  III,IV, VI: ptosis currently present left eye, extra-ocular motions intact in the right eye V,VII: Face symmetric, facial light touch sensation normal bilaterally VIII: hearing decreased bilaterally IX,X: uvula rises symmetrically XI: bilateral shoulder shrug XII: midline tongue extension Motor: Right : Upper extremity   5/5    Left:     Upper extremity   5/5  Lower extremity   5/5     Lower extremity   5/5  All muscle groups were tested in his LE's and are 5/5  Tone and bulk:normal tone throughout; no atrophy noted Sensory: Pinprick and light touch intact throughout, bilaterally in upper extremities, he has significant peripheral neuropathy from his knees down to his feet bilaterally. He has no proprioception in bilateral feet no vibratory sensation up to his knees, and decreased temperature up to the level of his knee. Deep Tendon Reflexes: 2+ and symmetric throughout upper extremities with no lower extremity deep tendon reflexes Plantars: Mute bilaterally Gait: Not tested     Lab Results: Basic Metabolic Panel:  Recent Labs Lab 10/04/15 0846 10/04/15 1830 10/05/15 0608 10/06/15 1154  NA 129*  --  128* 130*  K 4.7  --  4.9 3.9  CL 88*  --  87* 92*  CO2 26  --  23 25  GLUCOSE 228*  --  171* 274*  BUN 64*  --  77* 49*  CREATININE 12.63* 13.35* 13.89* 10.77*  CALCIUM 9.8  --  9.6 9.2  MG 2.8*  --   --   --   PHOS  --   --   --  6.5*    Liver Function Tests:  Recent Labs Lab 10/04/15 0846 10/06/15 1154  AST 24  --   ALT 18  --   ALKPHOS 60  --   BILITOT 0.6  --   PROT 7.7  --  ALBUMIN 3.6 3.3*   No results for input(s): LIPASE, AMYLASE in the last 168 hours.  Recent Labs Lab 10/04/15 0846  AMMONIA 28    CBC:  Recent Labs Lab 10/04/15 0846 10/05/15 0608 10/06/15 1154  WBC 7.1 6.8 5.6  NEUTROABS 5.0  --   --   HGB 11.5* 11.4* 10.9*  HCT 33.2* 33.7* 33.6*  MCV 88.3 88.5 90.3  PLT 206 212 178    Cardiac Enzymes:  Recent Labs Lab 10/07/15 1018  CKTOTAL 109    Lipid Panel: No results for input(s): CHOL, TRIG, HDL, CHOLHDL, VLDL, LDLCALC in the last 168 hours.  CBG:  Recent Labs Lab 10/06/15 1132 10/06/15 1628 10/06/15 1950 10/06/15 2238 10/07/15 0732  GLUCAP 292* 154* 307* 152* 195*    Microbiology: Results for orders placed or performed during the hospital encounter of 09/22/15  MRSA PCR Screening     Status: None   Collection Time: 09/22/15  5:03 PM  Result Value Ref Range Status   MRSA by PCR NEGATIVE NEGATIVE Final    Comment:        The GeneXpert MRSA Assay (FDA approved for NASAL specimens only), is one component of a comprehensive MRSA colonization surveillance program. It is not intended to diagnose MRSA infection nor to guide or monitor treatment for MRSA infections.     Coagulation Studies:  Recent Labs  10/05/15 0608 10/06/15 0425 10/07/15 1030  LABPROT 29.6* 33.4* 31.5*  INR 2.74 3.19 2.96    Imaging: Mr Brain Wo Contrast  Result Date: 10/07/2015 CLINICAL DATA:  Bilateral leg weakness. EXAM: MRI HEAD WITHOUT CONTRAST TECHNIQUE: Multiplanar, multiecho pulse sequences of the brain and surrounding structures were obtained without intravenous contrast. COMPARISON:  CT head 04/20/2015.  MRI cervical spine 10/05/2015 FINDINGS: Brain: Moderate atrophy. Ventricular enlargement consistent with atrophy. Negative for acute infarct. Chronic ischemic changes in the white matter and pons. No cortical infarct. Chronic micro hemorrhage left parietal lobe. No other areas of hemorrhage or fluid collection. Negative for  mass or edema. No shift of the midline structures. Vascular: Normal flow voids. Skull and upper cervical spine: Disc protrusion and spinal stenosis at C3-4. See cervical spine MRI report from 10/05/2015 Sinuses/Orbits: Paranasal sinuses clear. Chronic severe injury to the left globe with calcification. Right globe intact. Other: Normal surrounding soft tissues IMPRESSION: Atrophy and chronic ischemic change.  No acute infarct. Disc protrusion C3-4 with spinal stenosis. Electronically Signed   By: Marlan Palauharles  Clark M.D.   On: 10/07/2015 12:32   Mr Cervical Spine Wo Contrast  Result Date: 10/05/2015 CLINICAL DATA:  Inability to walk.  General weakness. EXAM: MRI CERVICAL SPINE WITHOUT CONTRAST TECHNIQUE: Multiplanar, multisequence MR imaging of the cervical spine was performed. No intravenous contrast was administered. COMPARISON:  Cervical spine CT 04/04/2015 FINDINGS: Despite efforts by the technologist and patient, motion artifact is present on today's examination and could not be eliminated. This reduces the sensitivity and specificity of the study. Alignment: Reversal of the normal cervical lordosis. Grade 1 anterolisthesis at C3-C4. Vertebrae: Degenerative endplate signal changes at C4-C7. No evidence of acute compression fracture. No facet edema. Cord: Faint hyperintense T2 weighted signal within the spinal cord at the C3-4 level. Normal cord caliber. No syrinx. Posterior Fossa, vertebral arteries, paraspinal tissues: Visualized posterior fossa is normal. Vertebral artery flow voids are preserved. Normal visualized paraspinal soft tissues. Disc levels: C1-C2: Advanced degenerative change. C2-C3: Small left central disc protrusion. No spinal canal stenosis. No neuroforaminal stenosis. C3-C4: There is a left central disc extrusion with  mild superior migration that effaces the ventral thecal sac and mildly flattens the left ventral aspect of the spinal cord. Minimal T2 hyperintensity is seen within the cord at  this level. Mild central spinal canal stenosis. Bilateral uncovertebral hypertrophy, worse on the left and left-greater-than-right facet hypertrophy, causing mild right and moderate left neuroforaminal stenosis. C4-C5: Severe disc space loss. In the anterior osteophyte formation and uncovertebral hypertrophy. Mild bilateral facet hypertrophy. Small disc bulge. No spinal canal stenosis. Moderate right and mild left neuroforaminal stenosis. C5-C6: Severe disc space loss with small disc bulge. Anterior osteophyte formation, bilateral uncovertebral hypertrophy and mild bilateral facet hypertrophy. No spinal canal stenosis. Mild bilateral neuroforaminal stenosis. C6-C7: Severe disc space loss with osteophyte formation and bilateral uncovertebral hypertrophy. Small disc bulge. No spinal canal stenosis. Mild bilateral neuroforaminal stenosis. C7-T1: Normal disc space and facet joints. No spinal canal stenosis. No neuroforaminal stenosis. IMPRESSION: 1. Left central disc extrusion with mild superior migration at C3-4 flattening the left ventral aspect of the spinal cord with associated faint T2 hyperintensity, suggesting a degree of myelomalacia. 2. Two severe degenerative disc disease with advanced height loss from C4-C7 without other spinal canal stenosis. 3. Multilevel mild-to-moderate neural foraminal narrowing. Electronically Signed   By: Deatra Robinson M.D.   On: 10/05/2015 23:29   Mr Lumbar Spine Wo Contrast  Result Date: 10/05/2015 CLINICAL DATA:  Difficulty walking.  Generalized weakness. EXAM: MRI LUMBAR SPINE WITHOUT CONTRAST TECHNIQUE: Multiplanar, multisequence MR imaging of the lumbar spine was performed. No intravenous contrast was administered. COMPARISON:  None. FINDINGS: Segmentation:  Normal Alignment:  Normal Vertebrae: There are degenerative endplate signal changes at L4-L5 and L5-S1. No other focal marrow lesion. No facet edema. Conus medullaris: Extends to the L2 level and appears normal. Paraspinal  and other soft tissues: Severe bilateral renal atrophy with multiple T2 hyperintense lesions are likely cysts. Other visualized retroperitoneal soft tissues are normal. Disc levels: T12-L1: Normal disc space and facet joints. No spinal canal stenosis. No neuroforaminal stenosis. L1-L2: Normal disc space and facet joints. No spinal canal stenosis. No neuroforaminal stenosis. L2-L3: Normal disc space and facet joints. No spinal canal stenosis. No neuroforaminal stenosis. L3-L4: Disc desiccation with small bulge and severe bilateral facet hypertrophy with fluid in the left facet joint. No spinal canal stenosis. Moderate left neuroforaminal stenosis. L4-L5: Disc desiccation with small bulge and severe bilateral facet hypertrophy. Fluid within the left facet joint. Bilateral lateral recess narrowing without central spinal canal stenosis. Moderate right and mild left neuroforaminal stenosis. L5-S1: Disc desiccation with small central disc protrusion. Severe right and mild left facet hypertrophy. No spinal canal stenosis. Severe right and moderate to severe left neural foraminal stenosis neuroforaminal stenosis. IMPRESSION: 1. Moderate to severe neural foraminal narrowing at L3-S1, worst at right L5-S1. 2. Multilevel severe facet arthrosis with fluid in the facet joints that may indicate a degree of segmental instability. 3. No spinal canal stenosis. Electronically Signed   By: Deatra Robinson M.D.   On: 10/05/2015 23:35      Assessment/Plan: Patient was able to provide a very detailed history this afternoon. States he has been able to walk in years and uses the assistance of his wife and a wheelchair. He made it known that issue is not a new issue for him.  Based on exam he does have some component of a peripheral neuropathy, more large fiber then small but most likely from his long standing diabetes and ESRD.  Strength in all muscle groups in his b/l extremities are very  good.  I believe this is the cause of  long standing deconditioning along with neuropathy.  Neuro Imagining reviewed, do not believe anything there would cause him not to walk, maybe only limit his mobility   Recommend aggressive Physical therapy  Please call back with any questions

## 2015-10-07 NOTE — Progress Notes (Signed)
ANTICOAGULATION CONSULT NOTE - Follow up  Pharmacy Consult for Coumadin Indication: atrial fibrillation  Allergies  Allergen Reactions  . Diltiazem Other (See Comments)    SEVERE STOMACH CRAMPING - Takes extended release form without problems    Patient Measurements: Height: 6\' 4"  (193 cm) Weight: 215 lb 2.7 oz (97.6 kg) IBW/kg (Calculated) : 86.8  Vital Signs: Temp: 98.5 F (36.9 C) (09/07 0900) Temp Source: Oral (09/07 0900) BP: 108/59 (09/07 0900) Pulse Rate: 61 (09/07 0900)  Labs:  Recent Labs  10/04/15 1830 10/05/15 0608 10/06/15 0425 10/06/15 1154  HGB  --  11.4*  --  10.9*  HCT  --  33.7*  --  33.6*  PLT  --  212  --  178  LABPROT  --  29.6* 33.4*  --   INR  --  2.74 3.19  --   CREATININE 13.35* 13.89*  --  10.77*    Estimated Creatinine Clearance: 6.7 mL/min (by C-G formula based on SCr of 10.77 mg/dL).  Assessment: 82 y/o M with PMH significant anemia, Afib/aflutter, DM, ESRD on HD MWF, glaucoma, gout, HTN, peripheral neuropathy, CHF, and PSVT presented on 10/04/15 with inability to walk and general weakness.  Home dose of warfarin: 7.5mg  daily.   Continues on warfarin for AFib/Flutter. INR yesterday slightly above goal at 3.19- lower dose of warfarin was given last evening. Patient refused INR check this morning.  Hgb low but stable, plts wnl- no bleeding noted.  Goal of Therapy:  INR 2-3 Monitor platelets by anticoagulation protocol: Yes   Plan:  Give warfarin 5mg  today x1 again- lower than home dose again Daily INR   Prapti Grussing D. Jalena Vanderlinden, PharmD, BCPS Clinical Pharmacist Pager: [phone] 10/07/2015 10:44 AM

## 2015-10-07 NOTE — Progress Notes (Signed)
PROGRESS NOTE                                                                                                                                                                                                             Patient Demographics:    [name] [name], is a 82 y.o. male, DOB - 10/17/1933, WUJ:[id number]  Admit date - 10/04/2015   Admitting Physician Ozella Rocks, MD  Outpatient Primary MD for the patient is Katy Apo, MD  LOS - 1  Chief Complaint  Patient presents with  . Fatigue       Brief Narrative    [name] [name] is a 82 y.o. male with medical history significant for ESRD on hemodialysis Monday Wednesday and Friday, HTN diabetes, atrial fibrillation with RVR, with multiple admissions, last on 08/12/2015, previous history of PA F , s/p ablation for PSVT, presenting today to the emergency department with generalized weakness and 1 week history of progressive dyspnea, accompanied by productive frothy cough. He denies rhinorrhea or hemoptysis. Nonsmoker, and the patient is not on home oxygen. On EMS arrived, the patient was hypoxic, at 87%, placed on 2 L with some relief. He denies fevers, chills, chest pain or palpitations, nausea vomiting or abdominal pain. HEENT reports decreased appetite. He makesminimal urine. He denies any lower extremities swelling . He denies any recent infections. No sick contacts. Denies any recent distance travel.  He denies any syncope, presyncope or vertigo. No changes in his meds and he is compliant.    Subjective:    [name] today has, No headache, No chest pain, No abdominal pain - No Nausea, No new weakness tingling or numbness, No Cough - SOB.     Assessment  & Plan :     1. Chronic diastolic CHF with EF 60% on echocardiogram done 4 weeks ago. Patient has ESRD, nephrology on board, being dialyzed for fluid removal, Compensated this admission.  2. ESRD, Monday,  Wednesday and Friday schedule. Renal following.  3. Chronic atrial fibrillation [place] vasc 2 score of 5. Patient on Coumadin, had to reduce beta blocker and Cardizem due to low heart rate, monitor heart rate and blood pressure. Pharmacy monitoring Coumadin.  4. Anemia of chronic disease. Stable no acute issues.  5. Essential hypertension. Monitor blood pressure on reduced Cardizem and beta blocker dose.  6. Generalized  weakness, difficulty in ambulating. Unable able to stand up with 2 person assist, family unable to take home, Chronic gradually progressive problem, no back pain or focal deficits, MRI C & L-spine noted with severe DJD and C spine central disc protrusion, Neuro and N. Surg called, likely poor surgical candidate, will request neurosurgery to provide their input which I will discuss with family and patient, await neurosurgery input. Patient and family leaning towards nonoperative management, overall quality of life appears poor, we'll also involve palliative care for goals of care.  7. Dental finding of adrenal nodule on CT scan last admission. Decadron suppression test done on the day of admission showed a a.m. cortisol of 4.9 which is borderline, discussed with endocrinologist Dr. Everardo All, repeat echo drawn tonight with a.m. cortisol tomorrow morning.   8.DM type II. On sliding scale and monitor.  Lab Results  Component Value Date   HGBA1C 8.8 (H) 09/22/2015   CBG (last 3)   Recent Labs  10/06/15 1950 10/06/15 2238 10/07/15 0732  GLUCAP 307* 152* 195*      Family Communication  :  Wife  Code Status :  DNR  Diet : Renal, N surg  Disposition Plan  :  SNF in am  Consults  :  Renal, Neuro, N Surg  Procedures  :    DVT Prophylaxis  :  Coumadin  Lab Results  Component Value Date   INR 3.19 10/06/2015   INR 2.74 10/05/2015   INR 2.73 10/04/2015     Lab Results  Component Value Date   PLT 178 10/06/2015    Inpatient Medications  Scheduled Meds: .  atorvastatin  10 mg Oral q1800  . calcium acetate  1,334 mg Oral TID WC  . cinacalcet  60 mg Oral QHS  . dexamethasone  1 mg Oral Once  . diltiazem  300 mg Oral Daily  . dorzolamide-timolol  1 drop Right Eye BID  . feeding supplement (NEPRO CARB STEADY)  237 mL Oral BID BM  . heparin  4,000 Units Dialysis Once in dialysis  . insulin aspart  0-5 Units Subcutaneous QHS  . insulin aspart  0-9 Units Subcutaneous TID WC  . latanoprost  1 drop Right Eye QHS  . LORazepam  1 mg Intravenous Once  . metoprolol  25 mg Oral BID  . multivitamin  1 tablet Oral QHS  . neomycin-polymyxin b-dexamethasone  1 drop Left Eye QID  . prednisoLONE acetate  1 drop Left Eye BID  . Warfarin - Pharmacist Dosing Inpatient   Does not apply q1800   Continuous Infusions:   PRN Meds:.sodium chloride, sodium chloride, acetaminophen **OR** acetaminophen, ALPRAZolam, calcium acetate, heparin, lidocaine (PF), lidocaine-prilocaine, oxyCODONE, pentafluoroprop-tetrafluoroeth, promethazine  Antibiotics  :    Anti-infectives    None         Objective:   Vitals:   10/06/15 1555 10/06/15 1700 10/06/15 1954 10/07/15 0900  BP: 115/77 117/71 124/72 (!) 108/59  Pulse: 65 70 66 61  Resp: 18 18 18 17   Temp: 97.3 F (36.3 C) 98.1 F (36.7 C) 97.4 F (36.3 C) 98.5 F (36.9 C)  TempSrc: Oral Oral Oral Oral  SpO2: 98% 100% 99% 97%  Weight: 97.6 kg (215 lb 2.7 oz)  97.6 kg (215 lb 2.7 oz)   Height:        Wt Readings from Last 3 Encounters:  10/06/15 97.6 kg (215 lb 2.7 oz)  09/24/15 97.4 kg (214 lb 11.7 oz)  08/31/15 104.1 kg (229 lb 6.4 oz)  Intake/Output Summary (Last 24 hours) at 10/07/15 0944 Last data filed at 10/07/15 0600  Gross per 24 hour  Intake                0 ml  Output             2966 ml  Net            -2966 ml     Physical Exam  Awake, mildly confused, Oriented X 2, No new F.N deficits, Normal affect,  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  Symmetrical Chest wall movement, Good air movement bilaterally, CTAB RRR,No Gallops,Rubs or new Murmurs, No Parasternal Heave +ve B.Sounds, Abd Soft, No tenderness, No organomegaly appriciated, No rebound - guarding or rigidity. No Cyanosis, Clubbing or edema, No new Rash or bruise      Data Review:    CBC  Recent Labs Lab 10/04/15 0846 10/05/15 0608 10/06/15 1154  WBC 7.1 6.8 5.6  HGB 11.5* 11.4* 10.9*  HCT 33.2* 33.7* 33.6*  PLT 206 212 178  MCV 88.3 88.5 90.3  MCH 30.6 29.9 29.3  MCHC 34.6 33.8 32.4  RDW 15.9* 15.9* 16.0*  LYMPHSABS 1.3  --   --   MONOABS 0.6  --   --   EOSABS 0.1  --   --   BASOSABS 0.0  --   --     Chemistries   Recent Labs Lab 10/04/15 0846 10/04/15 1830 10/05/15 0608 10/06/15 1154  NA 129*  --  128* 130*  K 4.7  --  4.9 3.9  CL 88*  --  87* 92*  CO2 26  --  23 25  GLUCOSE 228*  --  171* 274*  BUN 64*  --  77* 49*  CREATININE 12.63* 13.35* 13.89* 10.77*  CALCIUM 9.8  --  9.6 9.2  MG 2.8*  --   --   --   AST 24  --   --   --   ALT 18  --   --   --   ALKPHOS 60  --   --   --   BILITOT 0.6  --   --   --    ------------------------------------------------------------------------------------------------------------------ No results for input(s): CHOL, HDL, LDLCALC, TRIG, CHOLHDL, LDLDIRECT in the last 72 hours.  Lab Results  Component Value Date   HGBA1C 8.8 (H) 09/22/2015   ------------------------------------------------------------------------------------------------------------------  Recent Labs  10/05/15 1747  TSH 2.005   ------------------------------------------------------------------------------------------------------------------  Recent Labs  10/05/15 1747  VITAMINB12 1,287*  FOLATE 48.0    Coagulation profile  Recent Labs Lab 10/04/15 0930 10/05/15 0608 10/06/15 0425  INR 2.73 2.74 3.19    No results for input(s): DDIMER in the last 72 hours.  Cardiac Enzymes No  results for input(s): CKMB, TROPONINI, MYOGLOBIN in the last 168 hours.  Invalid input(s): CK ------------------------------------------------------------------------------------------------------------------    Component Value Date/Time   BNP 568.9 (H) 09/22/2015 16100833    Micro Results No results found for this or any previous visit (from the past 240 hour(s)).  Radiology Reports Dg Chest 2 View  Result Date: 10/04/2015 CLINICAL DATA:  Cough and generalized weakness. EXAM: CHEST  2 VIEW COMPARISON:  09/23/2015 and prior radiographs FINDINGS: Cardiomegaly again identified. Mild pulmonary vascular congestion noted. Very mild interstitial opacities bilaterally may represent minimal interstitial edema. Trace bilateral pleural effusions are noted. Mild left basilar atelectasis identified. There is no evidence of pneumothorax. IMPRESSION: Cardiomegaly with pulmonary vascular congestion and possible minimal  interstitial pulmonary edema. Trace bilateral pleural effusions. Mild bibasilar atelectasis. Electronically Signed   By: Harmon Pier M.D.   On: 10/04/2015 09:22   Dg Chest 2 View  Result Date: 09/23/2015 CLINICAL DATA:  Chronic cough, history of atrial flutter, diabetes, end-stage renal disease, suspect fluid overload. EXAM: CHEST  2 VIEW COMPARISON:  PA and lateral chest x-ray of September 22, 2015 FINDINGS: The lungs are slightly less well inflated today. The interstitial markings remain increased. The cardiac silhouette remains enlarged. The central pulmonary vascularity is engorged but stable. No significant pleural effusion is observed. The bony thorax is unremarkable. IMPRESSION: Mild pulmonary interstitial edema secondary to CHF which has improved since yesterday's study. Electronically Signed   By: David  [place] M.D.   On: 09/23/2015 07:21   Dg Chest 2 View  Result Date: 09/22/2015 CLINICAL DATA:  Chronic renal failure with weakness. History anemia and cardiac arrhythmia EXAM: CHEST  2 VIEW  COMPARISON:  August 12, 2015 FINDINGS: There is moderate generalized interstitial edema. There is a small right pleural effusion. There is cardiomegaly with pulmonary venous hypertension. No adenopathy. No appreciable airspace consolidation. IMPRESSION: Evidence of a degree of congestive heart failure without airspace consolidation. Electronically Signed   By: Bretta Bang III M.D.   On: 09/22/2015 08:54   Mr Cervical Spine Wo Contrast  Result Date: 10/05/2015 CLINICAL DATA:  Inability to walk.  General weakness. EXAM: MRI CERVICAL SPINE WITHOUT CONTRAST TECHNIQUE: Multiplanar, multisequence MR imaging of the cervical spine was performed. No intravenous contrast was administered. COMPARISON:  Cervical spine CT 04/04/2015 FINDINGS: Despite efforts by the technologist and patient, motion artifact is present on today's examination and could not be eliminated. This reduces the sensitivity and specificity of the study. Alignment: Reversal of the normal cervical lordosis. Grade 1 anterolisthesis at C3-C4. Vertebrae: Degenerative endplate signal changes at C4-C7. No evidence of acute compression fracture. No facet edema. Cord: Faint hyperintense T2 weighted signal within the spinal cord at the C3-4 level. Normal cord caliber. No syrinx. Posterior Fossa, vertebral arteries, paraspinal tissues: Visualized posterior fossa is normal. Vertebral artery flow voids are preserved. Normal visualized paraspinal soft tissues. Disc levels: C1-C2: Advanced degenerative change. C2-C3: Small left central disc protrusion. No spinal canal stenosis. No neuroforaminal stenosis. C3-C4: There is a left central disc extrusion with mild superior migration that effaces the ventral thecal sac and mildly flattens the left ventral aspect of the spinal cord. Minimal T2 hyperintensity is seen within the cord at this level. Mild central spinal canal stenosis. Bilateral uncovertebral hypertrophy, worse on the left and left-greater-than-right facet  hypertrophy, causing mild right and moderate left neuroforaminal stenosis. C4-C5: Severe disc space loss. In the anterior osteophyte formation and uncovertebral hypertrophy. Mild bilateral facet hypertrophy. Small disc bulge. No spinal canal stenosis. Moderate right and mild left neuroforaminal stenosis. C5-C6: Severe disc space loss with small disc bulge. Anterior osteophyte formation, bilateral uncovertebral hypertrophy and mild bilateral facet hypertrophy. No spinal canal stenosis. Mild bilateral neuroforaminal stenosis. C6-C7: Severe disc space loss with osteophyte formation and bilateral uncovertebral hypertrophy. Small disc bulge. No spinal canal stenosis. Mild bilateral neuroforaminal stenosis. C7-T1: Normal disc space and facet joints. No spinal canal stenosis. No neuroforaminal stenosis. IMPRESSION: 1. Left central disc extrusion with mild superior migration at C3-4 flattening the left ventral aspect of the spinal cord with associated faint T2 hyperintensity, suggesting a degree of myelomalacia. 2. Two severe degenerative disc disease with advanced height loss from C4-C7 without other spinal canal stenosis. 3. Multilevel mild-to-moderate neural foraminal narrowing.  Electronically Signed   By: Deatra Robinson M.D.   On: 10/05/2015 23:29   Mr Lumbar Spine Wo Contrast  Result Date: 10/05/2015 CLINICAL DATA:  Difficulty walking.  Generalized weakness. EXAM: MRI LUMBAR SPINE WITHOUT CONTRAST TECHNIQUE: Multiplanar, multisequence MR imaging of the lumbar spine was performed. No intravenous contrast was administered. COMPARISON:  None. FINDINGS: Segmentation:  Normal Alignment:  Normal Vertebrae: There are degenerative endplate signal changes at L4-L5 and L5-S1. No other focal marrow lesion. No facet edema. Conus medullaris: Extends to the L2 level and appears normal. Paraspinal and other soft tissues: Severe bilateral renal atrophy with multiple T2 hyperintense lesions are likely cysts. Other visualized  retroperitoneal soft tissues are normal. Disc levels: T12-L1: Normal disc space and facet joints. No spinal canal stenosis. No neuroforaminal stenosis. L1-L2: Normal disc space and facet joints. No spinal canal stenosis. No neuroforaminal stenosis. L2-L3: Normal disc space and facet joints. No spinal canal stenosis. No neuroforaminal stenosis. L3-L4: Disc desiccation with small bulge and severe bilateral facet hypertrophy with fluid in the left facet joint. No spinal canal stenosis. Moderate left neuroforaminal stenosis. L4-L5: Disc desiccation with small bulge and severe bilateral facet hypertrophy. Fluid within the left facet joint. Bilateral lateral recess narrowing without central spinal canal stenosis. Moderate right and mild left neuroforaminal stenosis. L5-S1: Disc desiccation with small central disc protrusion. Severe right and mild left facet hypertrophy. No spinal canal stenosis. Severe right and moderate to severe left neural foraminal stenosis neuroforaminal stenosis. IMPRESSION: 1. Moderate to severe neural foraminal narrowing at L3-S1, worst at right L5-S1. 2. Multilevel severe facet arthrosis with fluid in the facet joints that may indicate a degree of segmental instability. 3. No spinal canal stenosis. Electronically Signed   By: Deatra Robinson M.D.   On: 10/05/2015 23:35    Time Spent in minutes  30   SINGH,PRASHANT K M.D on 10/07/2015 at 9:44 AM  Between 7am to 7pm - Pager - [phone]  After 7pm go to [email or web address] - password [facility]  [facility] -  Office  [phone]

## 2015-10-07 NOTE — Progress Notes (Addendum)
[facility] Progress Note   Subjective: down to dry wt 97kg after 2.9 kg off w HD yesterday and 4Kg the day before.  Breathing better.  MRI of C-spine and LS spine do show some cord compression and at LS spine some areas of nerve root compression.   Vitals:   10/06/15 1555 10/06/15 1700 10/06/15 1954 10/07/15 0900  BP: 115/77 117/71 124/72 (!) 108/59  Pulse: 65 70 66 61  Resp: 18 18 18 17   Temp: 97.3 F (36.3 C) 98.1 F (36.7 C) 97.4 F (36.3 C) 98.5 F (36.9 C)  TempSrc: Oral Oral Oral Oral  SpO2: 98% 100% 99% 97%  Weight: 97.6 kg (215 lb 2.7 oz)  97.6 kg (215 lb 2.7 oz)   Height:        Inpatient medications: . atorvastatin  10 mg Oral q1800  . calcium acetate  1,334 mg Oral TID WC  . cinacalcet  60 mg Oral QHS  . dexamethasone  1 mg Oral Once  . diltiazem  300 mg Oral Daily  . dorzolamide-timolol  1 drop Right Eye BID  . feeding supplement (NEPRO CARB STEADY)  237 mL Oral BID BM  . heparin  4,000 Units Dialysis Once in dialysis  . insulin aspart  0-5 Units Subcutaneous QHS  . insulin aspart  0-9 Units Subcutaneous TID WC  . latanoprost  1 drop Right Eye QHS  . LORazepam  1 mg Intravenous Once  . metoprolol  25 mg Oral BID  . multivitamin  1 tablet Oral QHS  . neomycin-polymyxin b-dexamethasone  1 drop Left Eye QID  . prednisoLONE acetate  1 drop Left Eye BID  . Warfarin - Pharmacist Dosing Inpatient   Does not apply q1800     sodium chloride, sodium chloride, acetaminophen **OR** acetaminophen, ALPRAZolam, calcium acetate, heparin, lidocaine (PF), lidocaine-prilocaine, oxyCODONE, pentafluoroprop-tetrafluoroeth, promethazine  Exam: Gen blind L eye, no distress, calm and weak , chron ill  No jvd or bruits Chest clear bilat to bases RRR 2/6 SEM  Abd soft ntnd no mass or ascites +bs no hsm MS marked muscle wasting bilat calves especially Intact sensation grossly of UE's and LE's Ext no edema or wounds Neuro responds appropriately, nonfocal. Ox 3 and  alert.   LUA AVF +bruit   ECHO July '17 - normal LVEF, normal study CXR 9/4 - mild CHF w IS edema, trace effusions TSH 3/17 - 3.29    Dialysis: [facility]  MWF  4h  97kg   Hep 4200  LUA AVF   Assessment: 1.  Gait failure/ gen'd weakness - main issue, progressive problem.  Sig muscle wasting in the legs. CT and LS spine MRI's do show sig DJD w some cord compression, have asked neuro to f/u. 2.  ESRD - HD MWF, goes to all sessions 3.  Vol excess - resolved, down to dry wt 4.  MBD - cont phoslo/ Sensipar 5.  Anemia - Hb > 10, no esa here 6.  Chron afib - on dilt/ MTP and coumadin. BP's soft and HR controlled, will decrease dilt 180/ d    Plan - HD tomorrow, usual schedule; dec'd diltiazem for lowish bp's   Vinson Moselle MD [facility] pager [phone]    cell [phone] 10/07/2015, 9:52 AM    Recent Labs Lab 10/04/15 0846 10/04/15 1830 10/05/15 0608 10/06/15 1154  NA 129*  --  128* 130*  K 4.7  --  4.9 3.9  CL 88*  --  87* 92*  CO2 26  --  23 25  GLUCOSE 228*  --  171* 274*  BUN 64*  --  77* 49*  CREATININE 12.63* 13.35* 13.89* 10.77*  CALCIUM 9.8  --  9.6 9.2  PHOS  --   --   --  6.5*    Recent Labs Lab 10/04/15 0846 10/06/15 1154  AST 24  --   ALT 18  --   ALKPHOS 60  --   BILITOT 0.6  --   PROT 7.7  --   ALBUMIN 3.6 3.3*    Recent Labs Lab 10/04/15 0846 10/05/15 0608 10/06/15 1154  WBC 7.1 6.8 5.6  NEUTROABS 5.0  --   --   HGB 11.5* 11.4* 10.9*  HCT 33.2* 33.7* 33.6*  MCV 88.3 88.5 90.3  PLT 206 212 178   Iron/TIBC/Ferritin/ %Sat    Component Value Date/Time   IRON 25 (L) 01/27/2010 1327   TIBC 207 (L) 01/27/2010 1327   FERRITIN 262 01/27/2010 1327   IRONPCTSAT 12 (L) 01/27/2010 1327

## 2015-10-07 NOTE — Clinical Social Work Note (Signed)
CSW originally informed that patient medically stable for discharge, however later advised that patient will have an echo and labs drawn today. [facility] and updated. CSW will continue to follow and facilitate [facility] when medically stable.  Genelle BalVanessa Madysun Thall, MSW, LCSW Licensed Clinical Social Worker Clinical Social Work Department [facility] [phone]

## 2015-10-08 DIAGNOSIS — R5383 Other fatigue: Secondary | ICD-10-CM | POA: Diagnosis not present

## 2015-10-08 DIAGNOSIS — R29898 Other symptoms and signs involving the musculoskeletal system: Secondary | ICD-10-CM

## 2015-10-08 DIAGNOSIS — N186 End stage renal disease: Secondary | ICD-10-CM | POA: Diagnosis not present

## 2015-10-08 DIAGNOSIS — Z515 Encounter for palliative care: Secondary | ICD-10-CM

## 2015-10-08 DIAGNOSIS — I132 Hypertensive heart and chronic kidney disease with heart failure and with stage 5 chronic kidney disease, or end stage renal disease: Secondary | ICD-10-CM | POA: Diagnosis not present

## 2015-10-08 DIAGNOSIS — Z7189 Other specified counseling: Secondary | ICD-10-CM | POA: Diagnosis not present

## 2015-10-08 LAB — PROTIME-INR
INR: 2.64
Prothrombin Time: 28.7 seconds — ABNORMAL HIGH (ref 11.4–15.2)

## 2015-10-08 LAB — CBC
HCT: 37.4 % — ABNORMAL LOW (ref 39.0–52.0)
Hemoglobin: 12.6 g/dL — ABNORMAL LOW (ref 13.0–17.0)
MCH: 30.1 pg (ref 26.0–34.0)
MCHC: 33.7 g/dL (ref 30.0–36.0)
MCV: 89.5 fL (ref 78.0–100.0)
PLATELETS: 211 10*3/uL (ref 150–400)
RBC: 4.18 MIL/uL — AB (ref 4.22–5.81)
RDW: 15.5 % (ref 11.5–15.5)
WBC: 7.1 10*3/uL (ref 4.0–10.5)

## 2015-10-08 LAB — RENAL FUNCTION PANEL
ANION GAP: 14 (ref 5–15)
Albumin: 3.5 g/dL (ref 3.5–5.0)
BUN: 51 mg/dL — AB (ref 6–20)
CHLORIDE: 91 mmol/L — AB (ref 101–111)
CO2: 24 mmol/L (ref 22–32)
Calcium: 9.6 mg/dL (ref 8.9–10.3)
Creatinine, Ser: 9.66 mg/dL — ABNORMAL HIGH (ref 0.61–1.24)
GFR calc Af Amer: 5 mL/min — ABNORMAL LOW (ref >60)
GFR calc non Af Amer: 4 mL/min — ABNORMAL LOW (ref >60)
GLUCOSE: 183 mg/dL — AB (ref 65–99)
PHOSPHORUS: 6.6 mg/dL — AB (ref 2.5–4.6)
POTASSIUM: 6.1 mmol/L — AB (ref 3.5–5.1)
Sodium: 129 mmol/L — ABNORMAL LOW (ref 135–145)

## 2015-10-08 LAB — CORTISOL: Cortisol, Plasma: 3.5 ug/dL

## 2015-10-08 MED ORDER — WARFARIN SODIUM 7.5 MG PO TABS: 7.5000 mg | ORAL_TABLET | Freq: Once | ORAL | Status: DC

## 2015-10-08 NOTE — Progress Notes (Signed)
ANTICOAGULATION CONSULT NOTE - Follow up   Pharmacy Consult for Coumadin Indication: atrial fibrillation  Allergies  Allergen Reactions  . Diltiazem Other (See Comments)    SEVERE STOMACH CRAMPING - Takes extended release form without problems    Patient Measurements: Height: 6\' 4"  (193 cm) Weight: 214 lb 11.7 oz (97.4 kg) IBW/kg (Calculated) : 86.8  Vital Signs: Temp: 97.9 F (36.6 C) (09/08 0700) Temp Source: Oral (09/08 0700) BP: 126/71 (09/08 0800) Pulse Rate: 73 (09/08 0800)  Labs:  Recent Labs  10/06/15 0425 10/06/15 1154 10/07/15 1018 10/07/15 1030 10/08/15 0438  HGB  --  10.9*  --   --  12.6*  HCT  --  33.6*  --   --  37.4*  PLT  --  178  --   --  211  LABPROT 33.4*  --   --  31.5* 28.7*  INR 3.19  --   --  2.96 2.64  CREATININE  --  10.77*  --   --  9.66*  CKTOTAL  --   --  109  --   --     Estimated Creatinine Clearance: 7.5 mL/min (by C-G formula based on SCr of 9.66 mg/dL).  Assessment: 82 y/o M with PMH significant anemia, Afib/aflutter, DM, ESRD on HD MWF, glaucoma, gout, HTN, peripheral neuropathy, CHF, and PSVT presented on 10/04/15 with inability to walk and general weakness.  Home dose of warfarin: 7.5mg  daily.   Continues on warfarin for AFib/Flutter. Patient initially refused INR check yesterday, but then lab was collected after this author reviewed patient. INR this morning in range at 2.64- lower after receiving 2 doses of warfarin 5mg .  Hgb low but stable, plts wnl- no bleeding noted.  Goal of Therapy:  INR 2-3 Monitor platelets by anticoagulation protocol: Yes   Plan:  Warfarin 7.5mg  today x1 Daily INR   Priyah Schmuck D. Ed Mandich, PharmD, BCPS Clinical Pharmacist Pager: [phone] 10/08/2015 8:44 AM

## 2015-10-08 NOTE — Clinical Social Work Placement (Signed)
   CLINICAL SOCIAL WORK PLACEMENT  NOTE 10/08/15 - DISCHARGED TO [facility]  Date:  10/08/2015  Patient Details  Name: [name] [name] MRN: [id number] Date of Birth: [birth year]  Clinical Social Work is seeking post-discharge placement for this patient at the Skilled  Nursing Facility level of care (*CSW will initial, date and re-position this form in  chart as items are completed):  No (Wife provided facility preference)   Patient/family provided with [facility] Clinical Social Work Department's list of facilities offering this level of care within the geographic area requested by the patient (or if unable, by the patient's family).  Yes   Patient/family informed of their freedom to choose among providers that offer the needed level of care, that participate in Medicare, Medicaid or managed care program needed by the patient, have an available bed and are willing to accept the patient.  No   Patient/family informed of [facility]'s ownership interest in [facility] and [facility], as well as of the fact that they are under no obligation to receive care at these facilities.  PASRR submitted to EDS on       PASRR number received on       Existing PASRR number confirmed on 10/06/15     FL2 transmitted to all facilities in geographic area requested by pt/family on 10/06/15     FL2 transmitted to all facilities within larger geographic area on       Patient informed that his/her managed care company has contracts with or will negotiate with certain facilities, including the following:        Yes   Patient/family informed of bed offers received.  [facility]     Physician recommends and patient chooses bed at      [facility].  Patient to be transferred to facility by  ambulance     Patient family notified on 10/08/15 of transfer.  Name of family member notified:  Renee RamusBarbara Bray - wife     PHYSICIAN Please sign  FL2, Please sign DNR     Additional Comment:    _______________________________________________ Cristobal Goldmannrawford, Corena Tilson Bradley, LCSW 10/08/2015, 11:16 AM

## 2015-10-08 NOTE — Progress Notes (Signed)
[facility] Progress Note   Subjective: no c/o's   Vitals:   10/08/15 0900 10/08/15 0930 10/08/15 1000 10/08/15 1030  BP: 104/74 112/69 103/68 134/81  Pulse: 81 70 71 75  Resp: 16 15 16 16   Temp:      TempSrc:      SpO2:      Weight:      Height:        Inpatient medications: . atorvastatin  10 mg Oral q1800  . calcium acetate  1,334 mg Oral TID WC  . cinacalcet  60 mg Oral QHS  . diltiazem  180 mg Oral Daily  . dorzolamide-timolol  1 drop Right Eye BID  . feeding supplement (NEPRO CARB STEADY)  237 mL Oral BID BM  . insulin aspart  0-5 Units Subcutaneous QHS  . insulin aspart  0-9 Units Subcutaneous TID WC  . latanoprost  1 drop Right Eye QHS  . LORazepam  1 mg Intravenous Once  . metoprolol  25 mg Oral BID  . multivitamin  1 tablet Oral QHS  . neomycin-polymyxin b-dexamethasone  1 drop Left Eye QID  . prednisoLONE acetate  1 drop Left Eye BID  . warfarin  7.5 mg Oral ONCE-1800  . Warfarin - Pharmacist Dosing Inpatient   Does not apply q1800     sodium chloride, sodium chloride, acetaminophen **OR** acetaminophen, ALPRAZolam, calcium acetate, oxyCODONE, promethazine  Exam: Gen blind L eye, no distress, calm and weak , chron ill  No jvd or bruits Chest clear bilat to bases RRR 2/6 SEM  Abd soft ntnd no mass or ascites +bs no hsm MS marked muscle wasting bilat calves especially Intact sensation grossly of UE's and LE's Ext no edema or wounds Neuro responds appropriately, nonfocal. Ox 3 and alert.   LUA AVF +bruit   ECHO July '17 - normal LVEF, normal study CXR 9/4 - mild CHF w IS edema, trace effusions TSH 3/17 - 3.29    Dialysis: [facility]  MWF  4h  97kg   Hep 4200  LUA AVF   Assessment: 1.  Gait failure/ gen'd weakness - main issue, progressive.  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  Plan is for SNF.   2.  ESRD  - HD MWF, goes to all sessions 3.  Vol excess - resolved, down to dry wt 4.  MBD - cont phoslo/ Sensipar 5.  Anemia - Hb > 10, no esa here 6.  Chron afib - on dilt/ MTP and coumadin. BP's soft and HR controlled, will decrease dilt 180/ d    Plan - HD today, SNFP  Vinson Moselleob Madhavi Hamblen MD [facility] pager [phone]    cell [phone] 10/08/2015, 11:06 AM    Recent Labs Lab 10/05/15 0608 10/06/15 1154 10/08/15 0438  NA 128* 130* 129*  K 4.9 3.9 6.1*  CL 87* 92* 91*  CO2 23 25 24   GLUCOSE 171* 274* 183*  BUN 77* 49* 51*  CREATININE 13.89* 10.77* 9.66*  CALCIUM 9.6 9.2 9.6  PHOS  --  6.5* 6.6*    Recent Labs Lab 10/04/15 0846 10/06/15 1154 10/08/15 0438  AST 24  --   --   ALT 18  --   --   ALKPHOS 60  --   --   BILITOT 0.6  --   --   PROT 7.7  --   --  ALBUMIN 3.6 3.3* 3.5    Recent Labs Lab 10/04/15 0846 10/05/15 0608 10/06/15 1154 10/08/15 0438  WBC 7.1 6.8 5.6 7.1  NEUTROABS 5.0  --   --   --   HGB 11.5* 11.4* 10.9* 12.6*  HCT 33.2* 33.7* 33.6* 37.4*  MCV 88.3 88.5 90.3 89.5  PLT 206 212 178 211   Iron/TIBC/Ferritin/ %Sat    Component Value Date/Time   IRON 25 (L) 01/27/2010 1327   TIBC 207 (L) 01/27/2010 1327   FERRITIN 262 01/27/2010 1327   IRONPCTSAT 12 (L) 01/27/2010 1327

## 2015-10-08 NOTE — Discharge Instructions (Signed)
Follow with Primary MD Katy ApoPOLITE,RONALD D, MD in 2-3 days   Get CBC, CMP, INR, 2 view Chest X ray checked  by Primary MD or SNF MD in 5-7 days ( we routinely change or add medications that can affect your baseline labs and fluid status, therefore we recommend that you get the mentioned basic workup next visit with your PCP, your PCP may decide not to get them or add new tests based on their clinical decision)   Activity: As tolerated with Full fall precautions use walker/cane & assistance as needed   Disposition SNF   Diet:   Renal and 1.2 lit/day fluid restriction.   On your next visit with your primary care physician please Get Medicines reviewed and adjusted.   Please request your Prim.MD to go over all Hospital Tests and Procedure/Radiological results at the follow up, please get all Hospital records sent to your Prim MD by signing hospital release before you go home.   If you experience worsening of your admission symptoms, develop shortness of breath, life threatening emergency, suicidal or homicidal thoughts you must seek medical attention immediately by calling 911 or calling your MD immediately  if symptoms less severe.  You Must read complete instructions/literature along with all the possible adverse reactions/side effects for all the Medicines you take and that have been prescribed to you. Take any new Medicines after you have completely understood and accpet all the possible adverse reactions/side effects.   Do not drive, operate heavy machinery, perform activities at heights, swimming or participation in water activities or provide baby sitting services if your were admitted for syncope or siezures until you have seen by Primary MD or a Neurologist and advised to do so again.  Do not drive when taking Pain medications.    Do not take more than prescribed Pain, Sleep and Anxiety Medications  Special Instructions: If you have smoked or chewed Tobacco  in the last 2 yrs please  stop smoking, stop any regular Alcohol  and or any Recreational drug use.  Wear Seat belts while driving.   Please note  You were cared for by a hospitalist during your hospital stay. If you have any questions about your discharge medications or the care you received while you were in the hospital after you are discharged, you can call the unit and asked to speak with the hospitalist on call if the hospitalist that took care of you is not available. Once you are discharged, your primary care physician will handle any further medical issues. Please note that NO REFILLS for any discharge medications will be authorized once you are discharged, as it is imperative that you return to your primary care physician (or establish a relationship with a primary care physician if you do not have one) for your aftercare needs so that they can reassess your need for medications and monitor your lab values.

## 2015-10-08 NOTE — Discharge Summary (Signed)
[name] [name] GNF:[id number] DOB: Jun 10, 1933 DOA: 10/04/2015  PCP: Katy Apo, MD  Admit date: 10/04/2015  Discharge date: 10/08/2015  Admitted From: Home   Disposition:  SNF   Recommendations for Outpatient Follow-up:   Follow up with PCP in 1-2 weeks  PCP Please obtain BMP/CBC, 2 view CXR in 1week,  (see Discharge instructions)   PCP Please follow up on the following pending results: None   Home Health: None   Equipment/Devices: None  Consultations: Renal, Neuro Discharge Condition: Stable   CODE STATUS: DNR   Diet Recommendation: Renal   Chief Complaint  Patient presents with  . Fatigue     Brief history of present illness from the day of admission and additional interim summary    [name] [name] a 82 y.o.malewith medical history significant for ESRD on hemodialysis Monday Wednesday and Friday, HTN diabetes, atrial fibrillation with RVR, with multiple admissions, last on 08/12/2015, previous history of PA F , s/p ablation for PSVT, presenting today to the emergency department with generalized weakness and 1 week history of progressive dyspnea, accompanied by productive frothy cough. He denies rhinorrhea or hemoptysis. Nonsmoker, and the patient is not on home oxygen. On EMS arrived, the patient was hypoxic, at 87%, placed on 2 L with some relief. He denies fevers, chills, chest pain or palpitations, nausea vomiting or abdominal pain. HEENT reports decreased appetite. He makesminimal urine. He denies any lower extremities swelling . He denies any recent infections. No sick contacts. Denies any recent distance travel. He denies any syncope, presyncope or vertigo. No changes in his meds and he is compliant.   Hospital issues addressed     1. Chronic diastolic CHF with EF 60% on echocardiogram done  4 weeks ago. Patient has ESRD, nephrology on board, being dialyzed for fluid removal, Compensated this admission.  2. ESRD, Monday, Wednesday and Friday schedule. Renal following.  3. Chronic atrial fibrillation [place] vasc 2 score of 5. Patient on Coumadin, had to reduce beta blocker and Cardizem due to low heart rate, monitor heart rate and blood pressure. INR stable continue monitoring INR every 2-3 days at [facility].  4. Anemia of chronic disease. Stable no acute issues.  5. Essential hypertension. Monitor blood pressure on reduced Cardizem and beta blocker dose.  6. Generalized weakness, difficulty in ambulating. Unable able to stand up with 2 person assist, family unable to take home, Chronic gradually progressive problem, no back pain or focal deficits, MRI C & L-spine noted with severe DJD and C spine and minimal central disc protrusion, Neuro and N. Surg called, discussed the case with neurosurgeon Dr. Yetta Barre on 10/07/2015 per Dr. Yetta Barre no surgery needed. Per neurology symptoms are chronic and likely related to chronic deconditioning and neuropathy, PT recommended. Will require SNF.  7. Incedental finding of adrenal nodule on CT scan last admission. Decadron suppression test done on the day of admission showed a a.m. cortisol of 4.9 which is borderline, discussed with endocrinologist Dr. Everardo All, repeated test with repeat cortisol after Decadron last night of 3.5,  TSH 2, will have him follow with endocrinology in a week for further workup if any.   8.DM type II. Diet controlled   Discharge diagnosis     Active Problems:   ESRD (end stage renal disease) (HCC)   Weakness   Generalized weakness   Essential hypertension   ESRD on dialysis [facility])    Discharge instructions    Discharge Instructions    Discharge instructions    Complete by:  As directed   Follow with Primary MD Katy Apo, MD in 2-3 days   Get CBC, CMP, INR, 2 view Chest X ray checked  by Primary MD or SNF MD  in 5-7 days ( we routinely change or add medications that can affect your baseline labs and fluid status, therefore we recommend that you get the mentioned basic workup next visit with your PCP, your PCP may decide not to get them or add new tests based on their clinical decision)   Activity: As tolerated with Full fall precautions use walker/cane & assistance as needed   Disposition SNF   Diet:   Renal and 1.2 lit/day fluid restriction.   On your next visit with your primary care physician please Get Medicines reviewed and adjusted.   Please request your Prim.MD to go over all Hospital Tests and Procedure/Radiological results at the follow up, please get all Hospital records sent to your Prim MD by signing hospital release before you go home.   If you experience worsening of your admission symptoms, develop shortness of breath, life threatening emergency, suicidal or homicidal thoughts you must seek medical attention immediately by calling 911 or calling your MD immediately  if symptoms less severe.  You Must read complete instructions/literature along with all the possible adverse reactions/side effects for all the Medicines you take and that have been prescribed to you. Take any new Medicines after you have completely understood and accpet all the possible adverse reactions/side effects.   Do not drive, operate heavy machinery, perform activities at heights, swimming or participation in water activities or provide baby sitting services if your were admitted for syncope or siezures until you have seen by Primary MD or a Neurologist and advised to do so again.  Do not drive when taking Pain medications.    Do not take more than prescribed Pain, Sleep and Anxiety Medications  Special Instructions: If you have smoked or chewed Tobacco  in the last 2 yrs please stop smoking, stop any regular Alcohol  and or any Recreational drug use.  Wear Seat belts while driving.   Please  note  You were cared for by a hospitalist during your hospital stay. If you have any questions about your discharge medications or the care you received while you were in the hospital after you are discharged, you can call the unit and asked to speak with the hospitalist on call if the hospitalist that took care of you is not available. Once you are discharged, your primary care physician will handle any further medical issues. Please note that NO REFILLS for any discharge medications will be authorized once you are discharged, as it is imperative that you return to your primary care physician (or establish a relationship with a primary care physician if you do not have one) for your aftercare needs so that they can reassess your need for medications and monitor your lab values.   Increase activity slowly    Complete by:  As directed      Discharge Medications  Medication List    TAKE these medications   acetaminophen 325 MG tablet Commonly known as:  TYLENOL Take 650 mg by mouth daily as needed for mild pain.   atorvastatin 10 MG tablet Commonly known as:  LIPITOR Take 1 tablet (10 mg total) by mouth daily at 6 PM.   Calcium Acetate 667 MG Tabs Take 1-2 capsules by mouth 3 (three) times daily. 667 mg for snacks, 1334 mg for meals   diltiazem 360 MG 24 hr capsule Commonly known as:  CARDIZEM CD Take 1 capsule (360 mg total) by mouth daily.   docusate sodium 100 MG capsule Commonly known as:  COLACE Take 100 mg by mouth 2 (two) times daily as needed for mild constipation or moderate constipation.   dorzolamide-timolol 22.3-6.8 MG/ML ophthalmic solution Commonly known as:  COSOPT Place 1 drop into the right eye 2 (two) times daily.   lidocaine-prilocaine cream Commonly known as:  EMLA Apply 1 application topically as needed (apply to skin near dialysis port before dialysis).   metoprolol 50 MG tablet Commonly known as:  LOPRESSOR Take 1 tablet (50 mg total) by mouth 2 (two)  times daily.   neomycin-polymyxin b-dexamethasone 3.5-10000-0.1 Susp Commonly known as:  MAXITROL Place 1 drop into the left eye 4 (four) times daily.   prednisoLONE acetate 1 % ophthalmic suspension Commonly known as:  PRED FORTE Place 1 drop into the left eye 2 (two) times daily. Patient to use for 37 days. Filled on 03-28-15   RENA-VITE PO Take 1 tablet by mouth daily.   SENSIPAR 60 MG tablet Generic drug:  cinacalcet Take 60 mg by mouth at bedtime.   TOBRADEX ophthalmic solution Generic drug:  tobramycin-dexamethasone Place 1 drop into the left eye 4 (four) times daily. For 37 days. Filled 03-31-15   TRAVATAN Z 0.004 % Soln ophthalmic solution Generic drug:  Travoprost (BAK Free) Place 1 drop into the right eye at bedtime.   warfarin 5 MG tablet Commonly known as:  COUMADIN Take as directed by Coumadin Clinic What changed:  how much to take  how to take this  when to take this  additional instructions       Follow-up Information    Romero Belling, MD. Schedule an appointment as soon as possible for a visit in 1 week(s).   Specialty:  Endocrinology Contact information: [address] [place] [address] [phone]        Katy Apo, MD. Schedule an appointment as soon as possible for a visit in 1 week(s).   Specialty:  Internal Medicine Contact information: [address] [place] [address] [phone]        Dyke Maes, MD .   Specialty:  Nephrology Contact information: [address] [place] [address] [phone]        Lesleigh Noe, MD .   Specialty:  Cardiology Contact information: [phone] [address] [place] [address] [phone]           Major procedures and Radiology Reports - PLEASE review detailed and final reports thoroughly  -        Dg Chest 2 View  Result Date: 10/04/2015 CLINICAL DATA:  Cough and generalized weakness. EXAM: CHEST  2 VIEW  COMPARISON:  09/23/2015 and prior radiographs FINDINGS: Cardiomegaly again identified. Mild pulmonary vascular congestion noted. Very mild interstitial opacities bilaterally may represent minimal interstitial edema. Trace bilateral pleural effusions are noted. Mild left basilar atelectasis identified. There is no  evidence of pneumothorax. IMPRESSION: Cardiomegaly with pulmonary vascular congestion and possible minimal interstitial pulmonary edema. Trace bilateral pleural effusions. Mild bibasilar atelectasis. Electronically Signed   By: Harmon Pier M.D.   On: 10/04/2015 09:22   Dg Chest 2 View  Result Date: 09/23/2015 CLINICAL DATA:  Chronic cough, history of atrial flutter, diabetes, end-stage renal disease, suspect fluid overload. EXAM: CHEST  2 VIEW COMPARISON:  PA and lateral chest x-ray of September 22, 2015 FINDINGS: The lungs are slightly less well inflated today. The interstitial markings remain increased. The cardiac silhouette remains enlarged. The central pulmonary vascularity is engorged but stable. No significant pleural effusion is observed. The bony thorax is unremarkable. IMPRESSION: Mild pulmonary interstitial edema secondary to CHF which has improved since yesterday's study. Electronically Signed   By: David  [place] M.D.   On: 09/23/2015 07:21   Dg Chest 2 View  Result Date: 09/22/2015 CLINICAL DATA:  Chronic renal failure with weakness. History anemia and cardiac arrhythmia EXAM: CHEST  2 VIEW COMPARISON:  August 12, 2015 FINDINGS: There is moderate generalized interstitial edema. There is a small right pleural effusion. There is cardiomegaly with pulmonary venous hypertension. No adenopathy. No appreciable airspace consolidation. IMPRESSION: Evidence of a degree of congestive heart failure without airspace consolidation. Electronically Signed   By: Bretta Bang III M.D.   On: 09/22/2015 08:54   Mr Brain Wo Contrast  Result Date: 10/07/2015 CLINICAL DATA:  Bilateral leg weakness. EXAM:  MRI HEAD WITHOUT CONTRAST TECHNIQUE: Multiplanar, multiecho pulse sequences of the brain and surrounding structures were obtained without intravenous contrast. COMPARISON:  CT head 04/20/2015.  MRI cervical spine 10/05/2015 FINDINGS: Brain: Moderate atrophy. Ventricular enlargement consistent with atrophy. Negative for acute infarct. Chronic ischemic changes in the white matter and pons. No cortical infarct. Chronic micro hemorrhage left parietal lobe. No other areas of hemorrhage or fluid collection. Negative for mass or edema. No shift of the midline structures. Vascular: Normal flow voids. Skull and upper cervical spine: Disc protrusion and spinal stenosis at C3-4. See cervical spine MRI report from 10/05/2015 Sinuses/Orbits: Paranasal sinuses clear. Chronic severe injury to the left globe with calcification. Right globe intact. Other: Normal surrounding soft tissues IMPRESSION: Atrophy and chronic ischemic change.  No acute infarct. Disc protrusion C3-4 with spinal stenosis. Electronically Signed   By: Marlan Palau M.D.   On: 10/07/2015 12:32   Mr Cervical Spine Wo Contrast  Result Date: 10/05/2015 CLINICAL DATA:  Inability to walk.  General weakness. EXAM: MRI CERVICAL SPINE WITHOUT CONTRAST TECHNIQUE: Multiplanar, multisequence MR imaging of the cervical spine was performed. No intravenous contrast was administered. COMPARISON:  Cervical spine CT 04/04/2015 FINDINGS: Despite efforts by the technologist and patient, motion artifact is present on today's examination and could not be eliminated. This reduces the sensitivity and specificity of the study. Alignment: Reversal of the normal cervical lordosis. Grade 1 anterolisthesis at C3-C4. Vertebrae: Degenerative endplate signal changes at C4-C7. No evidence of acute compression fracture. No facet edema. Cord: Faint hyperintense T2 weighted signal within the spinal cord at the C3-4 level. Normal cord caliber. No syrinx. Posterior Fossa, vertebral arteries,  paraspinal tissues: Visualized posterior fossa is normal. Vertebral artery flow voids are preserved. Normal visualized paraspinal soft tissues. Disc levels: C1-C2: Advanced degenerative change. C2-C3: Small left central disc protrusion. No spinal canal stenosis. No neuroforaminal stenosis. C3-C4: There is a left central disc extrusion with mild superior migration that effaces the ventral thecal sac and mildly flattens the left ventral aspect of the spinal cord. Minimal T2  hyperintensity is seen within the cord at this level. Mild central spinal canal stenosis. Bilateral uncovertebral hypertrophy, worse on the left and left-greater-than-right facet hypertrophy, causing mild right and moderate left neuroforaminal stenosis. C4-C5: Severe disc space loss. In the anterior osteophyte formation and uncovertebral hypertrophy. Mild bilateral facet hypertrophy. Small disc bulge. No spinal canal stenosis. Moderate right and mild left neuroforaminal stenosis. C5-C6: Severe disc space loss with small disc bulge. Anterior osteophyte formation, bilateral uncovertebral hypertrophy and mild bilateral facet hypertrophy. No spinal canal stenosis. Mild bilateral neuroforaminal stenosis. C6-C7: Severe disc space loss with osteophyte formation and bilateral uncovertebral hypertrophy. Small disc bulge. No spinal canal stenosis. Mild bilateral neuroforaminal stenosis. C7-T1: Normal disc space and facet joints. No spinal canal stenosis. No neuroforaminal stenosis. IMPRESSION: 1. Left central disc extrusion with mild superior migration at C3-4 flattening the left ventral aspect of the spinal cord with associated faint T2 hyperintensity, suggesting a degree of myelomalacia. 2. Two severe degenerative disc disease with advanced height loss from C4-C7 without other spinal canal stenosis. 3. Multilevel mild-to-moderate neural foraminal narrowing. Electronically Signed   By: Deatra RobinsonKevin  Herman M.D.   On: 10/05/2015 23:29   Mr Lumbar Spine Wo  Contrast  Result Date: 10/05/2015 CLINICAL DATA:  Difficulty walking.  Generalized weakness. EXAM: MRI LUMBAR SPINE WITHOUT CONTRAST TECHNIQUE: Multiplanar, multisequence MR imaging of the lumbar spine was performed. No intravenous contrast was administered. COMPARISON:  None. FINDINGS: Segmentation:  Normal Alignment:  Normal Vertebrae: There are degenerative endplate signal changes at L4-L5 and L5-S1. No other focal marrow lesion. No facet edema. Conus medullaris: Extends to the L2 level and appears normal. Paraspinal and other soft tissues: Severe bilateral renal atrophy with multiple T2 hyperintense lesions are likely cysts. Other visualized retroperitoneal soft tissues are normal. Disc levels: T12-L1: Normal disc space and facet joints. No spinal canal stenosis. No neuroforaminal stenosis. L1-L2: Normal disc space and facet joints. No spinal canal stenosis. No neuroforaminal stenosis. L2-L3: Normal disc space and facet joints. No spinal canal stenosis. No neuroforaminal stenosis. L3-L4: Disc desiccation with small bulge and severe bilateral facet hypertrophy with fluid in the left facet joint. No spinal canal stenosis. Moderate left neuroforaminal stenosis. L4-L5: Disc desiccation with small bulge and severe bilateral facet hypertrophy. Fluid within the left facet joint. Bilateral lateral recess narrowing without central spinal canal stenosis. Moderate right and mild left neuroforaminal stenosis. L5-S1: Disc desiccation with small central disc protrusion. Severe right and mild left facet hypertrophy. No spinal canal stenosis. Severe right and moderate to severe left neural foraminal stenosis neuroforaminal stenosis. IMPRESSION: 1. Moderate to severe neural foraminal narrowing at L3-S1, worst at right L5-S1. 2. Multilevel severe facet arthrosis with fluid in the facet joints that may indicate a degree of segmental instability. 3. No spinal canal stenosis. Electronically Signed   By: Deatra RobinsonKevin  Herman M.D.   On:  10/05/2015 23:35    Micro Results     No results found for this or any previous visit (from the past 240 hour(s)).  Today   Subjective    [name] [name] today has no headache,no chest abdominal pain,no new weakness tingling or numbness, feels much better   Objective   Blood pressure 126/71, pulse 73, temperature 97.9 F (36.6 C), temperature source Oral, resp. rate 16, height 6\' 4"  (1.93 m), weight 97.4 kg (214 lb 11.7 oz), SpO2 99 %.   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  Symmetrical Chest wall movement, Good air movement bilaterally, CTAB RRR,No Gallops,Rubs or new Murmurs, No Parasternal Heave +ve B.Sounds, Abd Soft, Non tender, No organomegaly appriciated, No rebound -guarding or rigidity. No Cyanosis, Clubbing or edema, No new Rash or bruise   Data Review   CBC w Diff: Lab Results  Component Value Date   WBC 7.1 10/08/2015   HGB 12.6 (L) 10/08/2015   HCT 37.4 (L) 10/08/2015   PLT 211 10/08/2015   LYMPHOPCT 19 10/04/2015   MONOPCT 9 10/04/2015   EOSPCT 1 10/04/2015   BASOPCT 0 10/04/2015    CMP: Lab Results  Component Value Date   NA 129 (L) 10/08/2015   K 6.1 (H) 10/08/2015   CL 91 (L) 10/08/2015   CO2 24 10/08/2015   BUN 51 (H) 10/08/2015   CREATININE 9.66 (H) 10/08/2015   PROT 7.7 10/04/2015   ALBUMIN 3.5 10/08/2015   BILITOT 0.6 10/04/2015   ALKPHOS 60 10/04/2015   AST 24 10/04/2015   ALT 18 10/04/2015  .   Total Time in preparing paper work, data evaluation and todays exam - 35 minutes  Leroy Sea M.D on 10/08/2015 at 8:24 AM  [facility]   Office  [phone]

## 2015-10-08 NOTE — Progress Notes (Signed)
Report called to [facility]. 

## 2015-10-08 NOTE — Consult Note (Signed)
Consultation Note Date: 10/08/2015   Patient Name: [name] [name]  DOB: [birth year]  MRN: [id number]  Age / Sex: 82 y.o., male  PCP: Seward Carol, MD Referring Physician: Thurnell Lose, MD  Reason for Consultation: Establishing goals of care  HPI/Patient Profile: 82 y.o. male  with past medical history of ESRD (HD M/W/F) afib, severe DJD, and peripheral neuropathy, who was admitted on 10/04/2015 with SOB and an inability to walk.   He has been thru 6 hospital admissions in the last 6 months. His SOB was felt to be due to volume overload secondary to heart failure.  This was remedied with hemodialysis.  The inability to walk was worked up.  He was evaluated by neurology, neurosurgery and physical therapy.  There was no obvious cause for his inability to walk - the general consensus was that it was due to chronic symptoms including peripheral neuropathy, DJD, and deconditioning.  PT recommended acute rehab.  Clinical Assessment and Goals of Care: Dr. Ola Spurr and I met with the patient in hemo dialysis.  He is very pleasant, cognizant and appropriate.  He denied back pain (repeatedly) and stated he has not been able to walk for some period of time.  He was unable to be specific about the time period.  He stated clearly that he does not want live support, surgical procedures, or a feeding tube.  He does want to continue hemo-dialysis.  We discussed ending HD when he was ready with hospice support.  He understood he would live 10 - 14 days and likely pass peacefully in his sleep with the help of Hospice if he stopped HD.   We also discussed whether or not returning to the hospital was helpful for him.  He replied that he weighs out each situation  - he evaluates what it will cost him and whether or not it will be worth it.  For now he will continue coming to the hospital if needed.  Finally we discussed medications for  neuropathy and back pain.  He reported that he is very scared of any new medications and that currently he is not having any pain.  OTHER:  The patient is his own primary decision maker, but if he is unable to make medical decisions he would want his wife to make them for him.    SUMMARY OF RECOMMENDATIONS    Code Status/Advance Care Planning:  DNR    Symptom Management:   Per primary team.  Additional Recommendations (Limitations, Scope, Preferences): Palliative Care to follow at [facility] [place]  Prognosis:   < 12 months - given fragile volume status, in ability to walk, and worsening frailty and deconditioning.  Discharge Planning: [facility] for rehab with Palliative care service follow-up      Primary Diagnoses: Present on Admission: . ESRD (end stage renal disease) ([place])   I have reviewed the medical record, interviewed the patient and family, and examined the patient. The following aspects are pertinent.  Past Medical History:  Diagnosis Date  .  Anemia   . Atrial flutter ([place])   . Bradycardia    a. unable to tolerate beta blockers in the past.  . Diabetes mellitus ([place])   . ESRD (end stage renal disease) on dialysis [facility])    "MWF; [facility] [place]" (04/08/2015)  . Glaucoma   . Gout   . Hypertension   . PAF (paroxysmal atrial fibrillation) ([place])   . Peripheral neuropathy ([place])   . PSVT (paroxysmal supraventricular tachycardia) ([place])    a. h/o ablation.   Social History   Social History  . Marital status: Married    Spouse name: N/A  . Number of children: N/A  . Years of education: N/A   Social History Main Topics  . Smoking status: Never Smoker  . Smokeless tobacco: Never Used  . Alcohol use No  . Drug use: No  . Sexual activity: Not Asked   Other Topics Concern  . None   Social History Narrative  . None   Family History  Problem Relation Age of Onset  . Other Mother     bowel obstruction  . Heart failure  Father     fluid bluid up  . Alcoholism Brother   . Alcoholism Brother    Scheduled Meds: . atorvastatin  10 mg Oral q1800  . calcium acetate  1,334 mg Oral TID WC  . cinacalcet  60 mg Oral QHS  . diltiazem  180 mg Oral Daily  . dorzolamide-timolol  1 drop Right Eye BID  . feeding supplement (NEPRO CARB STEADY)  237 mL Oral BID BM  . insulin aspart  0-5 Units Subcutaneous QHS  . insulin aspart  0-9 Units Subcutaneous TID WC  . latanoprost  1 drop Right Eye QHS  . LORazepam  1 mg Intravenous Once  . metoprolol  25 mg Oral BID  . multivitamin  1 tablet Oral QHS  . neomycin-polymyxin b-dexamethasone  1 drop Left Eye QID  . prednisoLONE acetate  1 drop Left Eye BID  . warfarin  7.5 mg Oral ONCE-1800  . Warfarin - Pharmacist Dosing Inpatient   Does not apply q1800   Continuous Infusions:  PRN Meds:.sodium chloride, sodium chloride, acetaminophen **OR** acetaminophen, ALPRAZolam, calcium acetate, oxyCODONE, promethazine Medications Prior to Admission:  Prior to Admission medications   Medication Sig Start Date End Date Taking? Authorizing Provider  B Complex-C-Folic Acid (RENA-VITE PO) Take 1 tablet by mouth daily.   Yes Historical Provider, MD  Calcium Acetate 667 MG TABS Take 1-2 capsules by mouth 3 (three) times daily. 667 mg for snacks, 1334 mg for meals   Yes Historical Provider, MD  diltiazem (CARDIZEM CD) 360 MG 24 hr capsule Take 1 capsule (360 mg total) by mouth daily. 08/31/15  Yes Burtis Junes, NP  docusate sodium (COLACE) 100 MG capsule Take 100 mg by mouth 2 (two) times daily as needed for mild constipation or moderate constipation.    Yes Historical Provider, MD  dorzolamide-timolol (COSOPT) 22.3-6.8 MG/ML ophthalmic solution Place 1 drop into the right eye 2 (two) times daily.    Yes Historical Provider, MD  lidocaine-prilocaine (EMLA) cream Apply 1 application topically as needed (apply to skin near dialysis port before dialysis).  08/31/14  Yes Historical Provider, MD    metoprolol (LOPRESSOR) 50 MG tablet Take 1 tablet (50 mg total) by mouth 2 (two) times daily. 08/15/15  Yes Isaiah Serge, NP  neomycin-polymyxin b-dexamethasone (MAXITROL) 3.5-10000-0.1 SUSP Place 1 drop into the left eye 4 (four) times daily. 09/28/15  Yes Historical Provider, MD  prednisoLONE acetate (PRED FORTE) 1 % ophthalmic suspension Place 1 drop into the left eye 2 (two) times daily. Patient to use for 37 days. Filled on 03-28-15   Yes Historical Provider, MD  SENSIPAR 60 MG tablet Take 60 mg by mouth at bedtime.  09/03/14  Yes Historical Provider, MD  tobramycin-dexamethasone [facility]) ophthalmic solution Place 1 drop into the left eye 4 (four) times daily. For 37 days. Filled 03-31-15   Yes Historical Provider, MD  TRAVATAN Z 0.004 % SOLN ophthalmic solution Place 1 drop into the right eye at bedtime.  08/30/14  Yes Historical Provider, MD  warfarin (COUMADIN) 5 MG tablet Take as directed by [facility] Patient taking differently: Take 7.5 mg by mouth See admin instructions. 7.5 mg daily 07/09/15  Yes Belva Crome, MD  acetaminophen (TYLENOL) 325 MG tablet Take 650 mg by mouth daily as needed for mild pain.    Historical Provider, MD  atorvastatin (LIPITOR) 10 MG tablet Take 1 tablet (10 mg total) by mouth daily at 6 PM. 08/15/15   Isaiah Serge, NP   Allergies  Allergen Reactions  . Diltiazem Other (See Comments)    SEVERE STOMACH CRAMPING - Takes extended release form without problems   Review of Systems:  Denies CP, SOB, back pain, constipation  Physical Exam  Wd, elderly gentleman with poor dentition.  A&O, appropriate, NAD CV rrr, 3/6 systolic murmur Resp NAd abd soft, + bowel sounds Ext no edema, LE cool to the touch, able to move all 4 ext.  Vital Signs: BP 134/81   Pulse 75   Temp 97.9 F (36.6 C) (Oral)   Resp 16   Ht '6\' 4"'  (1.93 m)   Wt 97.4 kg (214 lb 11.7 oz)   SpO2 99%   BMI 26.14 kg/m  Pain Assessment: No/denies pain POSS *See Group Information*:  1-Acceptable,Awake and alert Pain Score: 0-No pain   SpO2: SpO2: 99 % O2 Device:SpO2: 99 % O2 Flow Rate: .   IO: Intake/output summary:  Intake/Output Summary (Last 24 hours) at 10/08/15 1115 Last data filed at 10/08/15 0930  Gross per 24 hour  Intake              540 ml  Output                0 ml  Net              540 ml    LBM: Last BM Date: 10/05/15 Baseline Weight: Weight: 102.1 kg (225 lb) Most recent weight: Weight: 97.4 kg (214 lb 11.7 oz)     Palliative Assessment/Data:   Flowsheet Rows   Flowsheet Row Most Recent Value  Intake Tab  Referral Department  Hospitalist  Unit at Time of Referral  Cardiac/Telemetry Unit  Palliative Care Primary Diagnosis  Other (Comment)  Date Notified  10/07/15  Palliative Care Type  New Palliative care  Reason for referral  Clarify Goals of Care  Date of Admission  10/04/15  Date first seen by Palliative Care  10/08/15  # of days Palliative referral response time  1 Day(s)  # of days IP prior to Palliative referral  3  Clinical Assessment  Palliative Performance Scale Score  40%  Psychosocial & Spiritual Assessment  Palliative Care Outcomes  Patient/Family meeting held?  Yes  Who was at the meeting?  patient  Palliative Care Outcomes  Clarified goals of care  Patient/Family wishes: Interventions discontinued/not started   Tube feedings/TPN,  PEG  Palliative Care follow-up planned  Yes, Facility      Time In: 10:00  Time Out: 10:50 Time Total: 50 min Greater than 50%  of this time was spent counseling and coordinating care related to the above assessment and plan.  Signed by: Imogene Burn, PA-C Palliative Medicine Pager: [phone]    Please contact Palliative Medicine Team phone at [phone] for questions and concerns.  For individual provider: See Shea Standifer

## 2015-10-12 ENCOUNTER — Ambulatory Visit: Payer: Medicare Other | Admitting: Nurse Practitioner

## 2015-10-12 NOTE — Progress Notes (Deleted)
CARDIOLOGY OFFICE NOTE  Date:  10/12/2015    [name] [name] Date of Birth: 02/17/1933 Medical Record #[id number]  PCP:  Katy ApoPOLITE,RONALD D, MD  Cardiologist:  Tyrone SageGerhardt & ***    No chief complaint on file.   History of Present Illness: [name] ErmRichard G Manning is a 82 y.o. male who presents today for a ***   Comes in today. Here with   Past Medical History:  Diagnosis Date  . Anemia   . Atrial flutter ([facility])   . Bradycardia    a. unable to tolerate beta blockers in the past.  . Diabetes mellitus ([facility])   . ESRD (end stage renal disease) on dialysis [facility])    "MWF; [address]" (04/08/2015)  . Glaucoma   . Gout   . Hypertension   . PAF (paroxysmal atrial fibrillation) ([facility])   . Peripheral neuropathy ([facility])   . PSVT (paroxysmal supraventricular tachycardia) ([facility])    a. h/o ablation.    Past Surgical History:  Procedure Laterality Date  . ABLATION OF DYSRHYTHMIC FOCUS    . SP AV DIALYSIS SHUNT ACCESS EXISTING *L*       Medications: Current Outpatient Prescriptions  Medication Sig Dispense Refill  . acetaminophen (TYLENOL) 325 MG tablet Take 650 mg by mouth daily as needed for mild pain.    Marland Kitchen. atorvastatin (LIPITOR) 10 MG tablet Take 1 tablet (10 mg total) by mouth daily at 6 PM. 30 tablet 6  . B Complex-C-Folic Acid (RENA-VITE PO) Take 1 tablet by mouth daily.    . Calcium Acetate 667 MG TABS Take 1-2 capsules by mouth 3 (three) times daily. 667 mg for snacks, 1334 mg for meals    . diltiazem (CARDIZEM CD) 360 MG 24 hr capsule Take 1 capsule (360 mg total) by mouth daily. 30 capsule 6  . docusate sodium (COLACE) 100 MG capsule Take 100 mg by mouth 2 (two) times daily as needed for mild constipation or moderate constipation.     . dorzolamide-timolol (COSOPT) 22.3-6.8 MG/ML ophthalmic solution Place 1 drop into the right eye 2 (two) times daily.     Marland Kitchen. lidocaine-prilocaine (EMLA) cream Apply 1 application topically as needed (apply to skin near dialysis port before  dialysis).     . metoprolol (LOPRESSOR) 50 MG tablet Take 1 tablet (50 mg total) by mouth 2 (two) times daily. 60 tablet 6  . neomycin-polymyxin b-dexamethasone (MAXITROL) 3.5-10000-0.1 SUSP Place 1 drop into the left eye 4 (four) times daily.    . prednisoLONE acetate (PRED FORTE) 1 % ophthalmic suspension Place 1 drop into the left eye 2 (two) times daily. Patient to use for 37 days. Filled on 03-28-15    . SENSIPAR 60 MG tablet Take 60 mg by mouth at bedtime.     Marland Kitchen. tobramycin-dexamethasone (TOBRADEX) ophthalmic solution Place 1 drop into the left eye 4 (four) times daily. For 37 days. Filled 03-31-15    . TRAVATAN Z 0.004 % SOLN ophthalmic solution Place 1 drop into the right eye at bedtime.     Marland Kitchen. warfarin (COUMADIN) 5 MG tablet Take as directed by [facility] (Patient taking differently: Take 7.5 mg by mouth See admin instructions. 7.5 mg daily) 50 tablet 3   No current facility-administered medications for this visit.     Allergies: Allergies  Allergen Reactions  . Diltiazem Other (See Comments)    SEVERE STOMACH CRAMPING - Takes extended release form without problems    Social History: The patient  reports that he  has never smoked. He has never used smokeless tobacco. He reports that he does not drink alcohol or use drugs.   Family History: The patient's ***family history includes Alcoholism in his brother and brother; Heart failure in his father; Other in his mother.   Review of Systems: Please see the history of present illness.   Otherwise, the review of systems is positive for {NONE DEFAULTED:18576::"none"}.   All other systems are reviewed and negative.   Physical Exam: VS:  There were no vitals taken for this visit. Marland Kitchen  BMI There is no height or weight on file to calculate BMI.  Wt Readings from Last 3 Encounters:  10/08/15 212 lb 4.9 oz (96.3 kg)  09/24/15 214 lb 11.7 oz (97.4 kg)  08/31/15 229 lb 6.4 oz (104.1 kg)    General: Pleasant. Well developed, well nourished  and in no acute distress.   HEENT: Normal.  Neck: Supple, no JVD, carotid bruits, or masses noted.  Cardiac: ***Regular rate and rhythm. No murmurs, rubs, or gallops. No edema.  Respiratory:  Lungs are clear to auscultation bilaterally with normal work of breathing.  GI: Soft and nontender.  MS: No deformity or atrophy. Gait and ROM intact.  Skin: Warm and dry. Color is normal.  Neuro:  Strength and sensation are intact and no gross focal deficits noted.  Psych: Alert, appropriate and with normal affect.   LABORATORY DATA:  EKG:  EKG {ACTION; IS/IS ZOX:[id number]} ordered today. This demonstrates ***.  Lab Results  Component Value Date   WBC 7.1 10/08/2015   HGB 12.6 (L) 10/08/2015   HCT 37.4 (L) 10/08/2015   PLT 211 10/08/2015   GLUCOSE 183 (H) 10/08/2015   ALT 18 10/04/2015   AST 24 10/04/2015   NA 129 (L) 10/08/2015   K 6.1 (H) 10/08/2015   CL 91 (L) 10/08/2015   CREATININE 9.66 (H) 10/08/2015   BUN 51 (H) 10/08/2015   CO2 24 10/08/2015   TSH 2.005 10/05/2015   INR 2.64 10/08/2015   HGBA1C 8.8 (H) 09/22/2015    BNP (last 3 results)  Recent Labs  08/12/15 2143 09/22/15 0833  BNP 276.9* 568.9*    ProBNP (last 3 results) No results for input(s): PROBNP in the last 8760 hours.   Other Studies Reviewed Today:   Assessment/Plan:   Current medicines are reviewed with the patient today.  The patient does not have concerns regarding medicines other than what has been noted above.  The following changes have been made:  See above.  Labs/ tests ordered today include:   No orders of the defined types were placed in this encounter.    Disposition:   FU with *** in {gen number 9-81:191478} {Days to years:10300}.   Patient is agreeable to this plan and will call if any problems develop in the interim.   Signed: Rosalio Macadamia, RN, ANP-C 10/12/2015 7:39 AM  [facility] [address] [place], [place]   [address] Phone: [phone] Fax: [phone]

## 2015-10-13 ENCOUNTER — Encounter: Payer: Self-pay | Admitting: Nurse Practitioner

## 2015-10-18 ENCOUNTER — Ambulatory Visit: Payer: Medicare Other | Admitting: Nurse Practitioner

## 2015-10-18 NOTE — Progress Notes (Deleted)
CARDIOLOGY OFFICE NOTE  Date:  10/18/2015    [name] [name] Date of Birth: 26-Feb-1933 Medical Record #[id number]  PCP:  Katy Apo, MD  Cardiologist:  Kyra Manges    No chief complaint on file.   History of Present Illness: [name] [name] is a 82 y.o. male who presents today for a follow up visit. Seen for Dr. Katrinka Blazing.   He has a past medical history of ESRD, multiple episodes of Afib with RVR, HTN, DM. He has previously been on amiodarone and has a hx of ablation for PSVT. Chadvasc score 5. No mention of CAD noted.   In July of 2017, he was transferred by [facility] after pt presented with elevated HR in the 120s. He was started on IV dilt and rate decreased to 100 and was in a fib at that time. His INR was therapeutic on his home dose of coumadin.   Notes reviewed mention that he has had a number of recent hospitalizations for AFib RVR converting to SR with amiodarone. Cardiology H&P notes repeat admissions for Afib with RVR. 3/21-3/24/17 (in the setting of pneumonia and hypoxia) and 04/12/15- 04/17/15 though this hospital stay his presenting rhythm was SR during the stay had RVR. Also 04/07/15 to 04/08/15 for a fib with RVR with c/o dizziness- given IV amiodarone and then home dose increased to 200 daily and pt converted to SR. He has also been seen to have AFlutter.  During that hospitalization Dr. Katrinka Blazing asked for EP consult and Dr. Ladona Ridgel saw the pt.  Dr. Ladona Ridgel recommended just rate control, stop amiodarone, increase the beta blocker and that he was not a good candidate for PPM and hope to avoid PPM implant.   It was noted that he had been on both amlodipine and Diltiazem - providers were aware. His statin was changed to Lipitor.   I saw him back in early August for his post hospital check - subsequently seen with Dr. Katrinka Blazing - ended up increasing his Diltiazem and stopped his Norvasc. He was in NSR but goal is to just manage with rate control.   He was readmitted  earlier this month with generalized weakness. MRI C & L-spine noted with severe DJD and C spine and minimal central disc protrusion, Neuro and N. Surg called and advised by Dr. Yetta Barre that no surgery needed. Per neurology symptoms are chronic and likely related to chronic deconditioning and neuropathy, PT recommended. Felt he would need SNF placement. There was an incidental finding of an adrenal nodule on CT - he was to follow up with endocrine.    Comes in today. Here alone.   Past Medical History:  Diagnosis Date  . Anemia   . Atrial flutter (HCC)   . Bradycardia    a. unable to tolerate beta blockers in the past.  . Diabetes mellitus (HCC)   . ESRD (end stage renal disease) on dialysis [facility])    "MWF; [address]" (04/08/2015)  . Glaucoma   . Gout   . Hypertension   . PAF (paroxysmal atrial fibrillation) (HCC)   . Peripheral neuropathy (HCC)   . PSVT (paroxysmal supraventricular tachycardia) (HCC)    a. h/o ablation.    Past Surgical History:  Procedure Laterality Date  . ABLATION OF DYSRHYTHMIC FOCUS    . SP AV DIALYSIS SHUNT ACCESS EXISTING *L*       Medications: Current Outpatient Prescriptions  Medication Sig Dispense Refill  . acetaminophen (TYLENOL) 325 MG tablet Take 650  mg by mouth daily as needed for mild pain.    Marland Kitchen atorvastatin (LIPITOR) 10 MG tablet Take 1 tablet (10 mg total) by mouth daily at 6 PM. 30 tablet 6  . B Complex-C-Folic Acid (RENA-VITE PO) Take 1 tablet by mouth daily.    . Calcium Acetate 667 MG TABS Take 1-2 capsules by mouth 3 (three) times daily. 667 mg for snacks, 1334 mg for meals    . diltiazem (CARDIZEM CD) 360 MG 24 hr capsule Take 1 capsule (360 mg total) by mouth daily. 30 capsule 6  . docusate sodium (COLACE) 100 MG capsule Take 100 mg by mouth 2 (two) times daily as needed for mild constipation or moderate constipation.     . dorzolamide-timolol (COSOPT) 22.3-6.8 MG/ML ophthalmic solution Place 1 drop into the right eye 2 (two)  times daily.     Marland Kitchen lidocaine-prilocaine (EMLA) cream Apply 1 application topically as needed (apply to skin near dialysis port before dialysis).     . metoprolol (LOPRESSOR) 50 MG tablet Take 1 tablet (50 mg total) by mouth 2 (two) times daily. 60 tablet 6  . neomycin-polymyxin b-dexamethasone (MAXITROL) 3.5-10000-0.1 SUSP Place 1 drop into the left eye 4 (four) times daily.    . prednisoLONE acetate (PRED FORTE) 1 % ophthalmic suspension Place 1 drop into the left eye 2 (two) times daily. Patient to use for 37 days. Filled on 03-28-15    . SENSIPAR 60 MG tablet Take 60 mg by mouth at bedtime.     Marland Kitchen tobramycin-dexamethasone (TOBRADEX) ophthalmic solution Place 1 drop into the left eye 4 (four) times daily. For 37 days. Filled 03-31-15    . TRAVATAN Z 0.004 % SOLN ophthalmic solution Place 1 drop into the right eye at bedtime.     Marland Kitchen warfarin (COUMADIN) 5 MG tablet Take as directed by [facility] (Patient taking differently: Take 7.5 mg by mouth See admin instructions. 7.5 mg daily) 50 tablet 3   No current facility-administered medications for this visit.     Allergies: Allergies  Allergen Reactions  . Diltiazem Other (See Comments)    SEVERE STOMACH CRAMPING - Takes extended release form without problems    Social History: The patient  reports that he has never smoked. He has never used smokeless tobacco. He reports that he does not drink alcohol or use drugs.   Family History: The patient's ***family history includes Alcoholism in his brother and brother; Heart failure in his father; Other in his mother.   Review of Systems: Please see the history of present illness.   Otherwise, the review of systems is positive for {NONE DEFAULTED:18576::"none"}.   All other systems are reviewed and negative.   Physical Exam: VS:  There were no vitals taken for this visit. Marland Kitchen  BMI There is no height or weight on file to calculate BMI.  Wt Readings from Last 3 Encounters:  10/08/15 212 lb 4.9 oz  (96.3 kg)  09/24/15 214 lb 11.7 oz (97.4 kg)  08/31/15 229 lb 6.4 oz (104.1 kg)    General: Pleasant. Well developed, well nourished and in no acute distress.   HEENT: Normal.  Neck: Supple, no JVD, carotid bruits, or masses noted.  Cardiac: ***Regular rate and rhythm. No murmurs, rubs, or gallops. No edema.  Respiratory:  Lungs are clear to auscultation bilaterally with normal work of breathing.  GI: Soft and nontender.  MS: No deformity or atrophy. Gait and ROM intact.  Skin: Warm and dry. Color is normal.  Neuro:  Strength and sensation are intact and no gross focal deficits noted.  Psych: Alert, appropriate and with normal affect.   LABORATORY DATA:  EKG:  EKG {ACTION; IS/IS ZOX:[id number]}OT:[id number]} ordered today. This demonstrates ***.  Lab Results  Component Value Date   WBC 7.1 10/08/2015   HGB 12.6 (L) 10/08/2015   HCT 37.4 (L) 10/08/2015   PLT 211 10/08/2015   GLUCOSE 183 (H) 10/08/2015   ALT 18 10/04/2015   AST 24 10/04/2015   NA 129 (L) 10/08/2015   K 6.1 (H) 10/08/2015   CL 91 (L) 10/08/2015   CREATININE 9.66 (H) 10/08/2015   BUN 51 (H) 10/08/2015   CO2 24 10/08/2015   TSH 2.005 10/05/2015   INR 2.64 10/08/2015   HGBA1C 8.8 (H) 09/22/2015    BNP (last 3 results)  Recent Labs  08/12/15 2143 09/22/15 0833  BNP 276.9* 568.9*    ProBNP (last 3 results) No results for input(s): PROBNP in the last 8760 hours.   Other Studies Reviewed Today:  Procedures: ECHO 08/14/15 Study Conclusions  - Left ventricle: The cavity size was normal. Wall thickness was  increased in a pattern of mild LVH. Systolic function was normal.  The estimated ejection fraction was in the range of 60% to 65%.  Indeterminant diastolic function (atrial fibrillation). Wall  motion was normal; there were no regional wall motion  abnormalities. - Aortic valve: Trileaflet; moderately calcified leaflets.  Transvalvular velocity was minimally increased. There was mild  stenosis. There  was trivial regurgitation. Mean gradient (S): 11  mm Hg. Valve area (VTI): 1.51 cm^2. Valve area (Vmax): 1.57 cm^2.  Valve area (Vmean): 1.54 cm^2. - Mitral valve: Mildly calcified annulus. There was trivial  regurgitation. - Left atrium: The atrium was moderately dilated. - Right ventricle: The cavity size was normal. Systolic function  was normal. - Right atrium: The atrium was moderately dilated. - Pulmonary arteries: No complete TR doppler jet so unable to  estimate PA systolic pressure. - Inferior vena cava: The vessel was normal in size. The  respirophasic diameter changes were in the normal range (>= 50%),  consistent with normal central venous pressure.  Impressions:  - Normal LV size with mild LV hypertrophy. EF 60-65%. Normal RV  size and systolic function. Mild aortic stenosis. Moderate  biatrial enlargement.  Assessment/Plan: 1. Multiple episodes of AF/flutter - now to manage with rate control - no plans to restore NSR - yet he is NSR today. No longer on amiodarone. Discussed with Dr. Katrinka BlazingSmith - will stop his Amlodipine - increase the Diltiazem to 360 mg today. See back with EKG in one month. He has apparently per Dr. Katrinka BlazingSmith has had a tendency to be bradycardic - hopefully we will be able to avoid. Dr. Ladona Ridgelaylor has wanted to avoid PPM implant.   2. Chronic anticoagulation - checking INR today  3. ESRD - on dialysis  4. HLD - now on Lipitor - recheck his labs on return OV with me in 4 weeks.   5. HTN - he is on 2 CCB's - discussed with Dr. Katrinka BlazingSmith here in the office this morning - will stop the Norvasc - increasing the Diltiazem to 360 mg a day.   Current medicines are reviewed with the patient today.  The patient does not have concerns regarding medicines other than what has been noted above.  The following changes have been made:  See above.  Labs/ tests ordered today include:   No orders of the defined types were placed in this  encounter.    Disposition:   FU with *** in {gen number 9-60:[id number]} {Days to years:10300}.   Patient is agreeable to this plan and will call if any problems develop in the interim.   Signed: Rosalio Macadamia, RN, ANP-C 10/18/2015 9:29 AM  [facility] [address] [place], [place]  [address] Phone: [phone] Fax: [phone]

## 2015-10-25 ENCOUNTER — Inpatient Hospital Stay (HOSPITAL_COMMUNITY)
Admission: EM | Admit: 2015-10-25 | Discharge: 2015-11-01 | DRG: 312 | Disposition: A | Discharge status: Skilled Nursing Facility | Payer: Medicare Other | Attending: Internal Medicine | Admitting: Internal Medicine

## 2015-10-25 ENCOUNTER — Encounter (HOSPITAL_COMMUNITY): Payer: Self-pay | Admitting: Emergency Medicine

## 2015-10-25 ENCOUNTER — Emergency Department (HOSPITAL_COMMUNITY): Payer: Medicare Other

## 2015-10-25 DIAGNOSIS — I350 Nonrheumatic aortic (valve) stenosis: Secondary | ICD-10-CM | POA: Diagnosis present

## 2015-10-25 DIAGNOSIS — R791 Abnormal coagulation profile: Secondary | ICD-10-CM | POA: Diagnosis present

## 2015-10-25 DIAGNOSIS — E1122 Type 2 diabetes mellitus with diabetic chronic kidney disease: Secondary | ICD-10-CM | POA: Diagnosis present

## 2015-10-25 DIAGNOSIS — I959 Hypotension, unspecified: Secondary | ICD-10-CM

## 2015-10-25 DIAGNOSIS — I509 Heart failure, unspecified: Secondary | ICD-10-CM

## 2015-10-25 DIAGNOSIS — I953 Hypotension of hemodialysis: Secondary | ICD-10-CM | POA: Diagnosis not present

## 2015-10-25 DIAGNOSIS — I480 Paroxysmal atrial fibrillation: Secondary | ICD-10-CM | POA: Diagnosis present

## 2015-10-25 DIAGNOSIS — I5032 Chronic diastolic (congestive) heart failure: Secondary | ICD-10-CM | POA: Diagnosis not present

## 2015-10-25 DIAGNOSIS — Z888 Allergy status to other drugs, medicaments and biological substances status: Secondary | ICD-10-CM

## 2015-10-25 DIAGNOSIS — G629 Polyneuropathy, unspecified: Secondary | ICD-10-CM

## 2015-10-25 DIAGNOSIS — Z992 Dependence on renal dialysis: Secondary | ICD-10-CM

## 2015-10-25 DIAGNOSIS — Z66 Do not resuscitate: Secondary | ICD-10-CM | POA: Diagnosis present

## 2015-10-25 DIAGNOSIS — Z7901 Long term (current) use of anticoagulants: Secondary | ICD-10-CM

## 2015-10-25 DIAGNOSIS — N186 End stage renal disease: Secondary | ICD-10-CM | POA: Diagnosis not present

## 2015-10-25 DIAGNOSIS — E8889 Other specified metabolic disorders: Secondary | ICD-10-CM | POA: Diagnosis present

## 2015-10-25 DIAGNOSIS — I10 Essential (primary) hypertension: Secondary | ICD-10-CM | POA: Diagnosis not present

## 2015-10-25 DIAGNOSIS — H5462 Unqualified visual loss, left eye, normal vision right eye: Secondary | ICD-10-CM | POA: Diagnosis present

## 2015-10-25 DIAGNOSIS — I471 Supraventricular tachycardia: Secondary | ICD-10-CM | POA: Diagnosis present

## 2015-10-25 DIAGNOSIS — E871 Hypo-osmolality and hyponatremia: Secondary | ICD-10-CM | POA: Diagnosis not present

## 2015-10-25 DIAGNOSIS — D649 Anemia, unspecified: Secondary | ICD-10-CM | POA: Diagnosis present

## 2015-10-25 DIAGNOSIS — I495 Sick sinus syndrome: Secondary | ICD-10-CM

## 2015-10-25 DIAGNOSIS — T82898A Other specified complication of vascular prosthetic devices, implants and grafts, initial encounter: Secondary | ICD-10-CM | POA: Diagnosis present

## 2015-10-25 DIAGNOSIS — I132 Hypertensive heart and chronic kidney disease with heart failure and with stage 5 chronic kidney disease, or end stage renal disease: Secondary | ICD-10-CM | POA: Diagnosis present

## 2015-10-25 DIAGNOSIS — R5381 Other malaise: Secondary | ICD-10-CM

## 2015-10-25 DIAGNOSIS — Z79899 Other long term (current) drug therapy: Secondary | ICD-10-CM

## 2015-10-25 DIAGNOSIS — D631 Anemia in chronic kidney disease: Secondary | ICD-10-CM | POA: Diagnosis present

## 2015-10-25 DIAGNOSIS — M6281 Muscle weakness (generalized): Secondary | ICD-10-CM

## 2015-10-25 DIAGNOSIS — I484 Atypical atrial flutter: Secondary | ICD-10-CM | POA: Diagnosis present

## 2015-10-25 DIAGNOSIS — I482 Chronic atrial fibrillation: Secondary | ICD-10-CM | POA: Diagnosis present

## 2015-10-25 DIAGNOSIS — E860 Dehydration: Secondary | ICD-10-CM | POA: Diagnosis present

## 2015-10-25 DIAGNOSIS — H409 Unspecified glaucoma: Secondary | ICD-10-CM | POA: Diagnosis present

## 2015-10-25 DIAGNOSIS — Z993 Dependence on wheelchair: Secondary | ICD-10-CM

## 2015-10-25 DIAGNOSIS — E1142 Type 2 diabetes mellitus with diabetic polyneuropathy: Secondary | ICD-10-CM | POA: Diagnosis present

## 2015-10-25 LAB — CBC WITH DIFFERENTIAL/PLATELET
Basophils Absolute: 0 K/uL (ref 0.0–0.1)
Basophils Relative: 1 %
Eosinophils Absolute: 0 K/uL (ref 0.0–0.7)
Eosinophils Relative: 1 %
HCT: 39.6 % (ref 39.0–52.0)
Hemoglobin: 13 g/dL (ref 13.0–17.0)
Lymphocytes Relative: 32 %
Lymphs Abs: 2 K/uL (ref 0.7–4.0)
MCH: 30.8 pg (ref 26.0–34.0)
MCHC: 32.8 g/dL (ref 30.0–36.0)
MCV: 93.8 fL (ref 78.0–100.0)
Monocytes Absolute: 0.7 K/uL (ref 0.1–1.0)
Monocytes Relative: 11 %
Neutro Abs: 3.5 K/uL (ref 1.7–7.7)
Neutrophils Relative %: 55 %
Platelets: 167 K/uL (ref 150–400)
RBC: 4.22 MIL/uL (ref 4.22–5.81)
RDW: 15.8 % — ABNORMAL HIGH (ref 11.5–15.5)
WBC: 6.2 K/uL (ref 4.0–10.5)

## 2015-10-25 LAB — URINE MICROSCOPIC-ADD ON

## 2015-10-25 LAB — I-STAT CG4 LACTIC ACID, ED
Lactic Acid, Venous: <0.3 mmol/L — ABNORMAL LOW (ref 0.5–1.9)
Lactic Acid, Venous: 1.87 mmol/L (ref 0.5–1.9)

## 2015-10-25 LAB — I-STAT TROPONIN, ED
Troponin i, poc: 0 ng/mL (ref 0.00–0.08)
Troponin i, poc: 0.05 ng/mL (ref 0.00–0.08)

## 2015-10-25 LAB — COMPREHENSIVE METABOLIC PANEL WITH GFR
ALT: 22 U/L (ref 17–63)
AST: 20 U/L (ref 15–41)
Albumin: 3.2 g/dL — ABNORMAL LOW (ref 3.5–5.0)
Alkaline Phosphatase: 56 U/L (ref 38–126)
Anion gap: 17 — ABNORMAL HIGH (ref 5–15)
BUN: 49 mg/dL — ABNORMAL HIGH (ref 6–20)
CO2: 23 mmol/L (ref 22–32)
Calcium: 8.8 mg/dL — ABNORMAL LOW (ref 8.9–10.3)
Chloride: 97 mmol/L — ABNORMAL LOW (ref 101–111)
Creatinine, Ser: 9.16 mg/dL — ABNORMAL HIGH (ref 0.61–1.24)
GFR calc Af Amer: 5 mL/min — ABNORMAL LOW
GFR calc non Af Amer: 5 mL/min — ABNORMAL LOW
Glucose, Bld: 164 mg/dL — ABNORMAL HIGH (ref 65–99)
Potassium: 4 mmol/L (ref 3.5–5.1)
Sodium: 137 mmol/L (ref 135–145)
Total Bilirubin: 1 mg/dL (ref 0.3–1.2)
Total Protein: 7.4 g/dL (ref 6.5–8.1)

## 2015-10-25 LAB — URINALYSIS, ROUTINE W REFLEX MICROSCOPIC
Glucose, UA: 100 mg/dL — AB
Ketones, ur: 15 mg/dL — AB
Nitrite: POSITIVE — AB
Protein, ur: >300 mg/dL — AB
Specific Gravity, Urine: 1.023 (ref 1.005–1.030)
pH: 6.5 (ref 5.0–8.0)

## 2015-10-25 LAB — PROTIME-INR
INR: 4.04
Prothrombin Time: 40.3 s — ABNORMAL HIGH (ref 11.4–15.2)

## 2015-10-25 LAB — BRAIN NATRIURETIC PEPTIDE: B Natriuretic Peptide: 580.8 pg/mL — ABNORMAL HIGH (ref 0.0–100.0)

## 2015-10-25 LAB — GLUCOSE, CAPILLARY: GLUCOSE-CAPILLARY: 236 mg/dL — AB (ref 65–99)

## 2015-10-25 LAB — TROPONIN I: Troponin I: 0.04 ng/mL (ref <0.03)

## 2015-10-25 MED ORDER — SODIUM CHLORIDE 0.9 % IV BOLUS (SEPSIS)
250.0000 mL | Freq: Once | INTRAVENOUS | Status: AC
  Administered 2015-10-25: 250 mL via INTRAVENOUS

## 2015-10-25 MED ORDER — ATORVASTATIN CALCIUM 10 MG PO TABS
10.0000 mg | ORAL_TABLET | Freq: Every day | ORAL | Status: DC
  Administered 2015-10-26 – 2015-10-31 (×6): 10 mg via ORAL
  Filled 2015-10-25 (×6): qty 1

## 2015-10-25 MED ORDER — DORZOLAMIDE HCL 2 % OP SOLN
1.0000 [drp] | Freq: Two times a day (BID) | OPHTHALMIC | Status: DC
  Administered 2015-10-26 – 2015-10-31 (×11): 1 [drp] via OPHTHALMIC
  Filled 2015-10-25: qty 10

## 2015-10-25 MED ORDER — ONDANSETRON HCL 4 MG/2ML IJ SOLN: 4.0000 mg | Freq: Four times a day (QID) | INTRAMUSCULAR | Status: DC | PRN

## 2015-10-25 MED ORDER — DORZOLAMIDE HCL-TIMOLOL MAL 2-0.5 % OP SOLN
1.0000 [drp] | Freq: Two times a day (BID) | OPHTHALMIC | Status: DC
  Filled 2015-10-25: qty 10

## 2015-10-25 MED ORDER — INSULIN ASPART 100 UNIT/ML ~~LOC~~ SOLN
0.0000 [IU] | Freq: Every day | SUBCUTANEOUS | Status: DC
  Administered 2015-10-26 – 2015-10-30 (×3): 2 [IU] via SUBCUTANEOUS

## 2015-10-25 MED ORDER — VANCOMYCIN HCL 10 G IV SOLR
2000.0000 mg | Freq: Once | INTRAVENOUS | Status: AC
  Administered 2015-10-25: 2000 mg via INTRAVENOUS
  Filled 2015-10-25: qty 2000

## 2015-10-25 MED ORDER — CALCIUM ACETATE (PHOS BINDER) 667 MG PO CAPS
1334.0000 mg | ORAL_CAPSULE | Freq: Three times a day (TID) | ORAL | Status: DC
  Administered 2015-10-26 – 2015-10-31 (×14): 1334 mg via ORAL
  Filled 2015-10-25 (×15): qty 2

## 2015-10-25 MED ORDER — METOPROLOL TARTRATE 25 MG PO TABS
25.0000 mg | ORAL_TABLET | Freq: Four times a day (QID) | ORAL | Status: DC
  Administered 2015-10-26 – 2015-10-29 (×13): 25 mg via ORAL
  Filled 2015-10-25 (×14): qty 1

## 2015-10-25 MED ORDER — DIPHENHYDRAMINE HCL 50 MG/ML IJ SOLN: 12.5000 mg | Freq: Once | INTRAMUSCULAR | Status: DC

## 2015-10-25 MED ORDER — SODIUM CHLORIDE 0.9 % IV SOLN: 250.0000 mL | INTRAVENOUS | Status: DC | PRN

## 2015-10-25 MED ORDER — TIMOLOL MALEATE 0.5 % OP SOLN
1.0000 [drp] | Freq: Two times a day (BID) | OPHTHALMIC | Status: DC
  Administered 2015-10-26 – 2015-10-31 (×11): 1 [drp] via OPHTHALMIC
  Filled 2015-10-25: qty 5

## 2015-10-25 MED ORDER — PIPERACILLIN-TAZOBACTAM 3.375 G IVPB
3.3750 g | Freq: Two times a day (BID) | INTRAVENOUS | Status: DC
  Filled 2015-10-25: qty 50

## 2015-10-25 MED ORDER — DEXAMETHASONE SODIUM PHOSPHATE 10 MG/ML IJ SOLN: 10.0000 mg | Freq: Once | INTRAMUSCULAR | Status: DC

## 2015-10-25 MED ORDER — PIPERACILLIN-TAZOBACTAM 3.375 G IVPB 30 MIN
3.3750 g | Freq: Once | INTRAVENOUS | Status: AC
  Administered 2015-10-25: 3.375 g via INTRAVENOUS
  Filled 2015-10-25: qty 50

## 2015-10-25 MED ORDER — SODIUM CHLORIDE 0.9 % IV BOLUS (SEPSIS)
1000.0000 mL | Freq: Once | INTRAVENOUS | Status: AC
  Administered 2015-10-25: 1000 mL via INTRAVENOUS

## 2015-10-25 MED ORDER — SODIUM CHLORIDE 0.9% FLUSH
3.0000 mL | Freq: Two times a day (BID) | INTRAVENOUS | Status: DC
  Administered 2015-10-26 – 2015-10-31 (×11): 3 mL via INTRAVENOUS

## 2015-10-25 MED ORDER — TOBRAMYCIN-DEXAMETHASONE 0.3-0.1 % OP SUSP
1.0000 [drp] | Freq: Four times a day (QID) | OPHTHALMIC | Status: DC
  Administered 2015-10-26 – 2015-10-31 (×18): 1 [drp] via OPHTHALMIC
  Filled 2015-10-25: qty 2.5

## 2015-10-25 MED ORDER — ONDANSETRON HCL 4 MG PO TABS: 4.0000 mg | ORAL_TABLET | Freq: Four times a day (QID) | ORAL | Status: DC | PRN

## 2015-10-25 MED ORDER — KETOROLAC TROMETHAMINE 30 MG/ML IJ SOLN: 30.0000 mg | Freq: Once | INTRAMUSCULAR | Status: DC

## 2015-10-25 MED ORDER — LATANOPROST 0.005 % OP SOLN
1.0000 [drp] | Freq: Every day | OPHTHALMIC | Status: DC
  Administered 2015-10-26 – 2015-10-31 (×6): 1 [drp] via OPHTHALMIC
  Filled 2015-10-25: qty 2.5

## 2015-10-25 MED ORDER — SODIUM CHLORIDE 0.9 % IV BOLUS (SEPSIS)
500.0000 mL | Freq: Once | INTRAVENOUS | Status: AC
  Administered 2015-10-25: 500 mL via INTRAVENOUS

## 2015-10-25 MED ORDER — DOCUSATE SODIUM 100 MG PO CAPS
100.0000 mg | ORAL_CAPSULE | Freq: Two times a day (BID) | ORAL | Status: DC
  Administered 2015-10-26 – 2015-10-28 (×2): 100 mg via ORAL
  Filled 2015-10-25 (×10): qty 1

## 2015-10-25 MED ORDER — ACETAMINOPHEN 650 MG RE SUPP: 650.0000 mg | Freq: Four times a day (QID) | RECTAL | Status: DC | PRN

## 2015-10-25 MED ORDER — VANCOMYCIN HCL IN DEXTROSE 1-5 GM/200ML-% IV SOLN: 1000.0000 mg | Freq: Once | INTRAVENOUS | Status: DC

## 2015-10-25 MED ORDER — INSULIN ASPART 100 UNIT/ML ~~LOC~~ SOLN
0.0000 [IU] | Freq: Three times a day (TID) | SUBCUTANEOUS | Status: DC
  Administered 2015-10-26: 5 [IU] via SUBCUTANEOUS
  Administered 2015-10-26 (×2): 3 [IU] via SUBCUTANEOUS
  Administered 2015-10-27: 2 [IU] via SUBCUTANEOUS
  Administered 2015-10-27: 3 [IU] via SUBCUTANEOUS
  Administered 2015-10-28: 2 [IU] via SUBCUTANEOUS
  Administered 2015-10-28: 1 [IU] via SUBCUTANEOUS
  Administered 2015-10-28 – 2015-10-29 (×2): 3 [IU] via SUBCUTANEOUS
  Administered 2015-10-29: 1 [IU] via SUBCUTANEOUS
  Administered 2015-10-30: 2 [IU] via SUBCUTANEOUS
  Administered 2015-10-31: 5 [IU] via SUBCUTANEOUS
  Administered 2015-10-31: 1 [IU] via SUBCUTANEOUS
  Administered 2015-10-31 – 2015-11-01 (×2): 2 [IU] via SUBCUTANEOUS

## 2015-10-25 MED ORDER — CALCIUM ACETATE 667 MG PO TABS: 667.0000 mg | ORAL_TABLET | ORAL | Status: DC

## 2015-10-25 MED ORDER — SODIUM CHLORIDE 0.9% FLUSH
3.0000 mL | Freq: Two times a day (BID) | INTRAVENOUS | Status: DC
  Administered 2015-10-27 – 2015-10-30 (×5): 3 mL via INTRAVENOUS

## 2015-10-25 MED ORDER — CINACALCET HCL 30 MG PO TABS
60.0000 mg | ORAL_TABLET | Freq: Every day | ORAL | Status: DC
  Administered 2015-10-26 – 2015-10-31 (×6): 60 mg via ORAL
  Filled 2015-10-25 (×6): qty 2

## 2015-10-25 MED ORDER — ACETAMINOPHEN 325 MG PO TABS: 650.0000 mg | ORAL_TABLET | Freq: Four times a day (QID) | ORAL | Status: DC | PRN

## 2015-10-25 MED ORDER — METOCLOPRAMIDE HCL 5 MG/ML IJ SOLN: 10.0000 mg | Freq: Once | INTRAMUSCULAR | Status: DC

## 2015-10-25 MED ORDER — VANCOMYCIN HCL IN DEXTROSE 1-5 GM/200ML-% IV SOLN: 1000.0000 mg | INTRAVENOUS | Status: DC

## 2015-10-25 MED ORDER — PREDNISOLONE ACETATE 1 % OP SUSP
1.0000 [drp] | Freq: Two times a day (BID) | OPHTHALMIC | Status: DC
  Administered 2015-10-26 – 2015-10-31 (×11): 1 [drp] via OPHTHALMIC
  Filled 2015-10-25: qty 1

## 2015-10-25 MED ORDER — SODIUM CHLORIDE 0.9% FLUSH
3.0000 mL | INTRAVENOUS | Status: DC | PRN
  Administered 2015-10-31: 3 mL via INTRAVENOUS
  Filled 2015-10-25: qty 3

## 2015-10-25 NOTE — ED Notes (Signed)
Spoke to lab.  Sts blood samples are contaminated with saline.  Spoke to Phlebotomist.   Will come to recollect.

## 2015-10-25 NOTE — ED Notes (Addendum)
[facility] called for update on pt.

## 2015-10-25 NOTE — ED Notes (Signed)
MD at bedside. 

## 2015-10-25 NOTE — ED Triage Notes (Signed)
Per [facility] called out to dialysis treatment center for weakness.  [facility] states facility reported patient weakness and hypotension.  Patient denies feeling weak at any point today.  Dialysis graft is currently accessed.  Treatment center connected bag of NS to dialysis catheter.  IV team called to deaccess.  Patient alert and oriented at this time.  Patient only completed half of today's dialysis.

## 2015-10-25 NOTE — Progress Notes (Signed)
Deaccessed Lt.arm graph,both venous and artieral needles removed,no bleeding at this time.

## 2015-10-25 NOTE — H&P (Signed)
[name] [name] ZOX:[id number] DOB: August 15, 1933 DOA: 10/25/2015     PCP: Katy Apo, MD   Outpatient Specialists: cardiology Ladona Ridgel Patient coming from: Nursing facility [facility]   Chief Complaint: hypotension while at HD  HPI: [name] [name] is a 82 y.o. male with medical history significant of ESRD on hemodialysis Monday Wednesday and Friday, HTN diabetes, atrial fibrillation with RVR, with multiple admissions, last on 08/12/2015, previous history of PA F , s/p ablation for PSVT   Presented with onset of hypotension and generalized fatigue while receiving hemodialysis EMS was called. Patient only completed half of a dialysis and was given 1 L normal saline bolus on arrival systolic blood pressure in the 70s he was noted to be tachycardic. Patient denied any chest pain or shortness of breath fevers chills abdominal pain associated this. He's been getting progressively weaker which has been chronic. Patient have had some nausea and vomiting for the past 2-3 days  Patient still makes minimal urine. He haven't had good appetite. No diarrhea, no chest pain. No cough. Patient states his blood pressure was low even before he started HD. HE denies feeling light headed no syncope Regarding pertinent Chronic problems: Recently was admitted on September 4 generalized fatigue and acute respiratory failure with hypoxia requiring 2 L of oxygen. Patient has chronic diastolic heart failure with preserved EF last echo done July 15 He has known history of chronic atrial fibrillation  [place] vasc 2 score of 5. On Coumadin his beta-blockade Hector. Used in the past secondary to bradycardia. During his prior admission he was found to be generally weak felt to be slowly progressing MRI of lumbar and cervical spine done showed severe DJD neurology as well as neurosurgery has been consulted Dr. Yetta Barre felt there was no operative intervention indicated. Recommended nursing home facility. He was seen by palliative  care consulted patient is DNR palliative care will follow up at the nursing facility he was discharged to [facility]  IN ER:  Temp (24hrs), Avg:97.6 F (36.4 C), Min:97.5 F (36.4 C), Max:97.7 F (36.5 C)      Blood pressure 107/83 heart rate 118 satting 100% respirations 18 Blood work was stated was diluted by saline. Lactic acid 1.87 troponin 0.05 INR 4.04 BNP 580 Chest x-ray showed improved appearance of pulmonary interstitium WBC 6.2 hemoglobin 13 sodium 137 potassium 4.0 creatinine 9.16  Patient was initially started on IV antibiotics for possible sepsis although at this point sepsis being less likely Following Medications were ordered in ER: Medications  piperacillin-tazobactam (ZOSYN) IVPB 3.375 g (not administered)  vancomycin (VANCOCIN) IVPB 1000 mg/200 mL premix (not administered)  sodium chloride 0.9 % bolus 1,000 mL (0 mLs Intravenous Stopped 10/25/15 1626)  piperacillin-tazobactam (ZOSYN) IVPB 3.375 g (0 g Intravenous Stopped 10/25/15 1626)  vancomycin (VANCOCIN) 2,000 mg in sodium chloride 0.9 % 500 mL IVPB (0 mg Intravenous Stopped 10/25/15 1834)  sodium chloride 0.9 % bolus 500 mL (0 mLs Intravenous Stopped 10/25/15 1834)      Hospitalist was called for admission for Transient hypotension  Review of Systems:    Pertinent positives include: nausea, vomiting,  Constitutional:  No weight loss, night sweats, Fevers, chills, fatigue, weight loss  HEENT:  No headaches, Difficulty swallowing,Tooth/dental problems,Sore throat,  No sneezing, itching, ear ache, nasal congestion, post nasal drip,  Cardio-vascular:  No chest pain, Orthopnea, PND, anasarca, dizziness, palpitations.no Bilateral lower extremity swelling  GI:  No heartburn, indigestion, abdominal pain,  diarrhea, change in bowel habits, loss of appetite, melena,  blood in stool, hematemesis Resp:  no shortness of breath at rest. No dyspnea on exertion, No excess mucus, no productive cough, No non-productive cough,  No coughing up of blood.No change in color of mucus.No wheezing. Skin:  no rash or lesions. No jaundice GU:  no dysuria, change in color of urine, no urgency or frequency. No straining to urinate.  No flank pain.  Musculoskeletal:  No joint pain or no joint swelling. No decreased range of motion. No back pain.  Psych:  No change in mood or affect. No depression or anxiety. No memory loss.  Neuro: no localizing neurological complaints, no tingling, no weakness, no double vision, no gait abnormality, no slurred speech, no confusion  As per HPI otherwise 10 point review of systems negative.   Past Medical History: Past Medical History:  Diagnosis Date  . Anemia   . Atrial flutter (HCC)   . Bradycardia    a. unable to tolerate beta blockers in the past.  . Diabetes mellitus (HCC)   . ESRD (end stage renal disease) on dialysis [facility])    "MWF; [address]" (04/08/2015)  . Glaucoma   . Gout   . Hypertension   . PAF (paroxysmal atrial fibrillation) (HCC)   . Peripheral neuropathy (HCC)   . PSVT (paroxysmal supraventricular tachycardia) (HCC)    a. h/o ablation.   Past Surgical History:  Procedure Laterality Date  . ABLATION OF DYSRHYTHMIC FOCUS    . SP AV DIALYSIS SHUNT ACCESS EXISTING *L*       Social History:  Ambulatory   walker or wheelchair bound     reports that he has never smoked. He has never used smokeless tobacco. He reports that he does not drink alcohol or use drugs.  Allergies:   Allergies  Allergen Reactions  . Diltiazem Other (See Comments)    SEVERE STOMACH CRAMPING - Takes extended release form without problems       Family History:   Family History  Problem Relation Age of Onset  . Other Mother     bowel obstruction  . Heart failure Father     fluid bluid up  . Alcoholism Brother   . Alcoholism Brother     Medications: Prior to Admission medications   Medication Sig Start Date End Date Taking? Authorizing Provider  acetaminophen  (TYLENOL) 325 MG tablet Take 650 mg by mouth daily as needed for mild pain.   Yes Historical Provider, MD  atorvastatin (LIPITOR) 10 MG tablet Take 1 tablet (10 mg total) by mouth daily at 6 PM. 08/15/15  Yes Leone Brand, NP  B Complex-C-Folic Acid (RENA-VITE PO) Take 1 tablet by mouth daily.   Yes Historical Provider, MD  Calcium Acetate 667 MG TABS Take 667-1,334 mg by mouth See admin instructions. 667 mg for snacks, 1334 mg for meals   Yes Historical Provider, MD  diltiazem (CARDIZEM CD) 360 MG 24 hr capsule Take 1 capsule (360 mg total) by mouth daily. 08/31/15  Yes Rosalio Macadamia, NP  docusate sodium (COLACE) 100 MG capsule Take 100 mg by mouth 2 (two) times daily.    Yes Historical Provider, MD  dorzolamide-timolol (COSOPT) 22.3-6.8 MG/ML ophthalmic solution Place 1 drop into the right eye 2 (two) times daily.    Yes Historical Provider, MD  lidocaine-prilocaine (EMLA) cream Apply 1 application topically as needed (apply to skin near dialysis port before dialysis).  08/31/14  Yes Historical Provider, MD  metoprolol (LOPRESSOR) 50 MG tablet Take 1 tablet (50  mg total) by mouth 2 (two) times daily. 08/15/15  Yes Leone Brand, NP  neomycin-polymyxin b-dexamethasone (MAXITROL) 3.5-10000-0.1 SUSP Place 1 drop into the left eye 4 (four) times daily. 09/28/15  Yes Historical Provider, MD  prednisoLONE acetate (PRED FORTE) 1 % ophthalmic suspension Place 1 drop into the left eye 2 (two) times daily. Patient to use for 37 days. Filled on 03-28-15   Yes Historical Provider, MD  SENSIPAR 60 MG tablet Take 60 mg by mouth at bedtime.  09/03/14  Yes Historical Provider, MD  tobramycin-dexamethasone Wallene Dales) ophthalmic solution Place 1 drop into the left eye 4 (four) times daily. For 37 days. Filled 03-31-15   Yes Historical Provider, MD  TRAVATAN Z 0.004 % SOLN ophthalmic solution Place 1 drop into the right eye at bedtime.  08/30/14  Yes Historical Provider, MD  warfarin (COUMADIN) 5 MG tablet Take as directed by  [facility]oumad[facility] Clinic Patient taking differently: Take 6 mg by mouth See admin instructions. 7.5 mg daily 07/09/15  Yes Lyn Records, MD    Physical Exam: Patient Vitals for the past 24 hrs:  BP Temp Temp src Pulse Resp SpO2  10/25/15 1908 - - - - 19 100 %  10/25/15 1830 107/83 - - - 20 -  10/25/15 1745 120/87 - - - 22 96 %  10/25/15 1728 99/69 97.7 F (36.5 C) Oral (!) 52 16 93 %  10/25/15 1715 99/69 - - - 18 -  10/25/15 1545 102/82 - - - 22 -  10/25/15 1515 (!) 88/66 - - - 21 -  10/25/15 1445 94/77 - - - 22 -  10/25/15 1415 (!) 79/65 - - - 20 -  10/25/15 1410 (!) 78/66 97.5 F (36.4 C) Oral (!) 132 12 92 %    1. General:  in No Acute distress 2. Psychological: Alert and   Oriented 3. Head/ENT:   Moist  Mucous Membranes                          Head Non traumatic, neck supple                            Poor Dentition 4. SKIN:   decreased Skin turgor,  Skin clean Dry and intact no rash 5. Heart: Regular rate and rhythm no  Murmur, Rub or gallop 6. Lungs:  Clear to auscultation bilaterally, no wheezes or crackles   7. Abdomen: Soft,  non-tender, Non distended 8. Lower extremities: no clubbing, cyanosis, or edema 9. Neurologically Grossly intact, moving all 4 extremities equally  10. MSK: Normal range of motion   body mass index is unknown because there is no height or weight on file.  Labs on Admission:   Labs on Admission: I have personally reviewed following labs and imaging studies  CBC:  Recent Labs Lab 10/25/15 1727  WBC 6.2  NEUTROABS 3.5  HGB 13.0  HCT 39.6  MCV 93.8  PLT 167   Basic Metabolic Panel:  Recent Labs Lab 10/25/15 1727  NA 137  K 4.0  CL 97*  CO2 23  GLUCOSE 164*  BUN 49*  CREATININE 9.16*  CALCIUM 8.8*   GFR: CrCl cannot be calculated (Unknown ideal weight.). Liver Function Tests:  Recent Labs Lab 10/25/15 1727  AST 20  ALT 22  ALKPHOS 56  BILITOT 1.0  PROT 7.4  ALBUMIN 3.2*   No results for input(s): LIPASE, AMYLASE in the  last 168 hours. No results for input(s): AMMONIA in the last 168 hours. Coagulation Profile:  Recent Labs Lab 10/25/15 1727  INR 4.04*   Cardiac Enzymes: No results for input(s): CKTOTAL, CKMB, CKMBINDEX, TROPONINI in the last 168 hours. BNP (last 3 results) No results for input(s): PROBNP in the last 8760 hours. HbA1C: No results for input(s): HGBA1C in the last 72 hours. CBG: No results for input(s): GLUCAP in the last 168 hours. Lipid Profile: No results for input(s): CHOL, HDL, LDLCALC, TRIG, CHOLHDL, LDLDIRECT in the last 72 hours. Thyroid Function Tests: No results for input(s): TSH, T4TOTAL, FREET4, T3FREE, THYROIDAB in the last 72 hours. Anemia Panel: No results for input(s): VITAMINB12, FOLATE, FERRITIN, TIBC, IRON, RETICCTPCT in the last 72 hours.  Sepsis Labs: @LABRCNTIP (procalcitonin:4,lacticidven:4) )No results found for this or any previous visit (from the past 240 hour(s)).     UA  not ordered  Lab Results  Component Value Date   HGBA1C 8.8 (H) 09/22/2015    CrCl cannot be calculated (Unknown ideal weight.).  BNP (last 3 results) No results for input(s): PROBNP in the last 8760 hours.   ECG REPORT  Independently reviewed Rate: 132  Rhythm: Possibly ectopic atrial tachycardia ST&T Change: early repolarization QTC 488  There were no vitals filed for this visit.   Cultures:    Component Value Date/Time   SDES BLOOD RIGHT ANTECUBITAL 04/20/2015 1222   SPECREQUEST BOTTLES DRAWN AEROBIC AND ANAEROBIC 5CC 04/20/2015 1222   CULT NO GROWTH 5 DAYS 04/20/2015 1222   REPTSTATUS 04/26/2015 FINAL 04/20/2015 1222     Radiological Exams on Admission: Dg Chest Portable 1 View  Result Date: 10/25/2015 CLINICAL DATA:  Hypotension, history of atrial flutter and bradycardia. History of diabetes, end-stage renal disease EXAM: PORTABLE CHEST 1 VIEW COMPARISON:  PA and lateral chest x-ray of October 04, 2015 FINDINGS: The lungs are mildly hypoinflated. The  interstitial markings are mildly increased but are not as conspicuous as on the study of October 04, 2015. The cardiac silhouette remains mildly enlarged. The pulmonary vascularity is not clearly engorged. The mediastinum is normal in width. The bony thorax exhibits no acute abnormality. IMPRESSION: Improved appearance of the pulmonary interstitium since the previous study. Stable mild cardiomegaly without over pulmonary vascular congestion. No evidence of pneumonia. Electronically Signed   By: David  [place]az[place] M.D.   On: 10/25/2015 15:17    Chart has been reviewed    Assessment/Plan  82 y.o. male with medical history significant of ESRD on hemodialysis Monday Wednesday and Friday, HTN diabetes, atrial fibrillation with RVR, with multiple admissions, last on 08/12/2015, previous history of PA F , s/p ablation for PSVT been admitted for transient hypotension in a setting of hemodialysis likely secondary to dehydration from recent nausea and vomiting  Present on Admission: . Hypotension in the setting of hemodialysis. Possibly secondary to fluid shifts. Also recent nausea and vomiting. Blood pressure currently stabilized. No evidence for sepsis. Hold off on IV antibiotics for now. . Sinus bradycardia-tachycardia syndrome (HCC) stable continue home medications with holding parameters . Physical deconditioning will need to discharge with PT OT follow-up . PAF (paroxysmal atrial fibrillation) (HCC) [name] of 5. Supratherapeutic INR will hold Coumadin for now. Given hypotension will only restart metoprolol with holding parameters and would attempt to restart Cardizem once blood pressure stabilized . For tonight permissive hypertension and mild tachycardia . Essential hypertension hold home medications given recent hypotension . Diabetes mellitus with ESRD (end-stage renal disease) (HCC) with a sliding scale End-stage  renal disease have notified nephrology but is even a message regarding patient  being admitted with appreciate the assistance of management Nausea and vomiting currently appears to be improved will continue supportive management denies any diarrhea abdomen soft nontender  Other plan as per orders.  DVT prophylaxis:  SCD    Code Status:    DNR/DNI as per patient   Family Communication:   Family not at  Bedside   Disposition Plan:                              Back to current facility when stable                                                  Social Work   Nutrition                             Consults called: Renal left a msg    Admission status:  obs   Level of care   tele            I have spent a total of 56 min on this admission    Meryl Ponder 10/25/2015, 8:01 PM   [facility]  Pager [phone]   after 2 AM please page floor coverage PA If 7AM-7PM, please contact the day team taking care of the patient  [email or web address]  Password [email or web address]

## 2015-10-25 NOTE — ED Notes (Signed)
Critical INR lab given to [place], [place]

## 2015-10-25 NOTE — Progress Notes (Signed)
CRITICAL VALUE ALERT  Critical value received:  Troponin 0.04   Date of notification:  10/25/15  Time of notification:  2310  Critical value read back:Yes.    Nurse who received alert:  Idelle CrouchMaria M. Konrad DoloresLester, RN  MD notified (1st page):  Anastassia Doutova  Time of first page:  [id number]  MD notified (2nd page):  Time of second page:  Responding MD: Therisa DoyneAnastassia Doutova  Time MD responded:  2330

## 2015-10-25 NOTE — ED Provider Notes (Signed)
[facility]-EMERGENCY DEPT Provider Note   CSN: [id number] Arrival date & time: 10/25/15  1355     History   Chief Complaint Chief Complaint  Patient presents with  . Hypotension    HPI [name] [name] is a 82 y.o. male the past, history of atrial flutter, ESRD on dialysis, A. fib, PSVT, DM who presents to the ED today from dialysis due to hypotension and tachycardia. Patient is a poor historian. Patient states that he was at dialysis today and received about 2 hours when the nurse told him that his heart rate was getting very high and his blood pressure was dropping so they sent him to the ED for further evaluation. Patient denies any chest pain, shortness of breath, fevers, abdominal pain. Patient states she still makes a very small amount of urine but denies any dysuria or urgency. Patient does report gradual weakness in his lower extremities onset 6 months ago which has been evaluated in the hospital previously. He was admitted earlier this month due to those symptoms and no underlying etiology was found. Patient also reports some nausea and vomiting for the last 2-3 days, nonbloody, nonbilious. Patient reports compliance with dialysis and states he has not missed any sessions. He denies any new medications. No sick contacts.  HPI  Past Medical History:  Diagnosis Date  . Anemia   . Atrial flutter (HCC)   . Bradycardia    a. unable to tolerate beta blockers in the past.  . Diabetes mellitus (HCC)   . ESRD (end stage renal disease) on dialysis [facility](HCC)    "MWF; [address]" (04/08/2015)  . Glaucoma   . Gout   . Hypertension   . PAF (paroxysmal atrial fibrillation) (HCC)   . Peripheral neuropathy (HCC)   . PSVT (paroxysmal supraventricular tachycardia) (HCC)    a. h/o ablation.    Patient Active Problem List   Diagnosis Date Noted  . Leg weakness, bilateral   . Palliative care encounter   . Goals of care, counseling/discussion   . Weakness 10/04/2015  . Generalized weakness    . Essential hypertension   . ESRD on dialysis (HCC)   . Congestive heart failure (HCC)   . PAF (paroxysmal atrial fibrillation) (HCC) 04/20/2015  . Encephalopathy 04/20/2015  . Physical deconditioning 04/20/2015  . Diabetes mellitus with complication (HCC) 04/20/2015  . Acute respiratory failure with hypoxia (HCC)   . Atrial flutter with rapid ventricular response (HCC)   . Acute respiratory failure (HCC) 04/12/2015  . HCAP (healthcare-associated pneumonia) 04/12/2015  . Elevated troponin 04/12/2015  . Anemia 04/12/2015  . Bradycardia 11/19/2014  . Encounter for therapeutic drug monitoring 06/16/2014  . Sinus bradycardia-tachycardia syndrome (HCC) 01/11/2012    Class: Chronic  . Atrial flutter (HCC) 01/10/2012  . Blind left eye 01/10/2012  . ESRD (end stage renal disease) (HCC) 01/10/2012  . Atrial fibrillation with RVR (HCC) 01/10/2012  . Hypertension   . Peripheral neuropathy (HCC)   . Diabetes mellitus with ESRD (end-stage renal disease) (HCC)     Past Surgical History:  Procedure Laterality Date  . ABLATION OF DYSRHYTHMIC FOCUS    . SP AV DIALYSIS SHUNT ACCESS EXISTING *L*         Home Medications    Prior to Admission medications   Medication Sig Start Date End Date Taking? Authorizing Provider  acetaminophen (TYLENOL) 325 MG tablet Take 650 mg by mouth daily as needed for mild pain.    Historical Provider, MD  atorvastatin (LIPITOR) 10 MG tablet  Take 1 tablet (10 mg total) by mouth daily at 6 PM. 08/15/15   Leone Brand, NP  B Complex-C-Folic Acid (RENA-VITE PO) Take 1 tablet by mouth daily.    Historical Provider, MD  Calcium Acetate 667 MG TABS Take 1-2 capsules by mouth 3 (three) times daily. 667 mg for snacks, 1334 mg for meals    Historical Provider, MD  diltiazem (CARDIZEM CD) 360 MG 24 hr capsule Take 1 capsule (360 mg total) by mouth daily. 08/31/15   Rosalio Macadamia, NP  docusate sodium (COLACE) 100 MG capsule Take 100 mg by mouth 2 (two) times daily as  needed for mild constipation or moderate constipation.     Historical Provider, MD  dorzolamide-timolol (COSOPT) 22.3-6.8 MG/ML ophthalmic solution Place 1 drop into the right eye 2 (two) times daily.     Historical Provider, MD  lidocaine-prilocaine (EMLA) cream Apply 1 application topically as needed (apply to skin near dialysis port before dialysis).  08/31/14   Historical Provider, MD  metoprolol (LOPRESSOR) 50 MG tablet Take 1 tablet (50 mg total) by mouth 2 (two) times daily. 08/15/15   Leone Brand, NP  neomycin-polymyxin b-dexamethasone (MAXITROL) 3.5-10000-0.1 SUSP Place 1 drop into the left eye 4 (four) times daily. 09/28/15   Historical Provider, MD  prednisoLONE acetate (PRED FORTE) 1 % ophthalmic suspension Place 1 drop into the left eye 2 (two) times daily. Patient to use for 37 days. Filled on 03-28-15    Historical Provider, MD  SENSIPAR 60 MG tablet Take 60 mg by mouth at bedtime.  09/03/14   Historical Provider, MD  tobramycin-dexamethasone Wallene Dales) ophthalmic solution Place 1 drop into the left eye 4 (four) times daily. For 37 days. Filled 03-31-15    Historical Provider, MD  TRAVATAN Z 0.004 % SOLN ophthalmic solution Place 1 drop into the right eye at bedtime.  08/30/14   Historical Provider, MD  warfarin (COUMADIN) 5 MG tablet Take as directed by [facility] Patient taking differently: Take 7.5 mg by mouth See admin instructions. 7.5 mg daily 07/09/15   Lyn Records, MD    Family History Family History  Problem Relation Age of Onset  . Other Mother     bowel obstruction  . Heart failure Father     fluid bluid up  . Alcoholism Brother   . Alcoholism Brother     Social History Social History  Substance Use Topics  . Smoking status: Never Smoker  . Smokeless tobacco: Never Used  . Alcohol use No     Allergies   Diltiazem   Review of Systems Review of Systems  All other systems reviewed and are negative.    Physical Exam Updated Vital Signs BP 102/82    Pulse (!) 132   Temp 97.5 F (36.4 C) (Oral)   Resp 22   SpO2 92%   Physical Exam  Constitutional: He is oriented to person, place, and time. No distress.  Ill-appearing, elderly  HENT:  Head: Normocephalic and atraumatic.  Mouth/Throat: No oropharyngeal exudate.  Eyes: EOM are normal. Right eye exhibits no discharge. Left eye exhibits no discharge. No scleral icterus.  Cardiovascular: Normal rate, regular rhythm, normal heart sounds and intact distal pulses.  Exam reveals no gallop and no friction rub.   No murmur heard. Palpable left thrill over AV fistula.  Pulmonary/Chest: Effort normal and breath sounds normal. No respiratory distress. He has no wheezes. He has no rales. He exhibits no tenderness.  Abdominal: Soft. Bowel sounds are normal.  He exhibits no distension. There is no tenderness. There is no guarding.  Musculoskeletal: Normal range of motion. He exhibits no edema.  Neurological: He is alert and oriented to person, place, and time.  Skin: Skin is warm and dry. No rash noted. He is not diaphoretic. No erythema. No pallor.  Psychiatric: He has a normal mood and affect. His behavior is normal.  Nursing note and vitals reviewed.    ED Treatments / Results  Labs (all labs ordered are listed, but only abnormal results are displayed) Labs Reviewed  PROTIME-INR - Abnormal; Notable for the following:       Result Value   Prothrombin Time 40.3 (*)    INR 4.04 (*)    All other components within normal limits  BRAIN NATRIURETIC PEPTIDE - Abnormal; Notable for the following:    B Natriuretic Peptide 580.8 (*)    All other components within normal limits  CBC WITH DIFFERENTIAL/PLATELET - Abnormal; Notable for the following:    RDW 15.8 (*)    All other components within normal limits  COMPREHENSIVE METABOLIC PANEL - Abnormal; Notable for the following:    Chloride 97 (*)    Glucose, Bld 164 (*)    BUN 49 (*)    Creatinine, Ser 9.16 (*)    Calcium 8.8 (*)    Albumin 3.2  (*)    GFR calc non Af Amer 5 (*)    GFR calc Af Amer 5 (*)    Anion gap 17 (*)    All other components within normal limits  I-STAT CG4 LACTIC ACID, ED - Abnormal; Notable for the following:    Lactic Acid, Venous <0.30 (*)    All other components within normal limits  CULTURE, BLOOD (ROUTINE X 2)  CULTURE, BLOOD (ROUTINE X 2)  URINE CULTURE  CBC WITH DIFFERENTIAL/PLATELET  URINALYSIS, ROUTINE W REFLEX MICROSCOPIC (NOT AT [facility])  I-STAT TROPOININ, ED  I-STAT CG4 LACTIC ACID, ED  I-STAT TROPOININ, ED  I-STAT CG4 LACTIC ACID, ED  I-STAT TROPOININ, ED    EKG  EKG Interpretation  Date/Time:  Monday October 25 2015 14:07:21 EDT Ventricular Rate:  132 PR Interval:    QRS Duration: 91 QT Interval:  329 QTC Calculation: 488 R Axis:   10 Text Interpretation:  Sinus or ectopic atrial tachycardia, unclear if p waves in place Repolarization abnormality, prob rate related Borderline prolonged QT interval Since prior ECG, rate has increased Confirmed by [facility] MD, ERIN ([id number]) on 10/25/2015 2:36:48 PM Also confirmed by [facility] MD, ERIN ([id number]), editor [place], Cala Bradford [phone])  on 10/25/2015 3:38:03 PM       Radiology Dg Chest Portable 1 View  Result Date: 10/25/2015 CLINICAL DATA:  Hypotension, history of atrial flutter and bradycardia. History of diabetes, end-stage renal disease EXAM: PORTABLE CHEST 1 VIEW COMPARISON:  PA and lateral chest x-ray of October 04, 2015 FINDINGS: The lungs are mildly hypoinflated. The interstitial markings are mildly increased but are not as conspicuous as on the study of October 04, 2015. The cardiac silhouette remains mildly enlarged. The pulmonary vascularity is not clearly engorged. The mediastinum is normal in width. The bony thorax exhibits no acute abnormality. IMPRESSION: Improved appearance of the pulmonary interstitium since the previous study. Stable mild cardiomegaly without over pulmonary vascular congestion. No evidence of pneumonia.  Electronically Signed   By: David  [place] M.D.   On: 10/25/2015 15:17    Procedures Procedures (including critical care time)  CRITICAL CARE Performed by: Dub Mikes   Total critical  care time: 35 minutes  Critical care time was exclusive of separately billable procedures and treating other patients.  Critical care was necessary to treat or prevent imminent or life-threatening deterioration.  Critical care was time spent personally by me on the following activities: development of treatment plan with patient and/or surrogate as well as nursing, discussions with consultants, evaluation of patient's response to treatment, examination of patient, obtaining history from patient or surrogate, ordering and performing treatments and interventions, ordering and review of laboratory studies, ordering and review of radiographic studies, pulse oximetry and re-evaluation of patient's condition.   Medications Ordered in ED Medications  sodium chloride 0.9 % bolus 1,000 mL (not administered)  vancomycin (VANCOCIN) 2,000 mg in sodium chloride 0.9 % 500 mL IVPB (not administered)  piperacillin-tazobactam (ZOSYN) IVPB 3.375 g (not administered)  vancomycin (VANCOCIN) IVPB 1000 mg/200 mL premix (not administered)  sodium chloride 0.9 % bolus 500 mL (not administered)  piperacillin-tazobactam (ZOSYN) IVPB 3.375 g (3.375 g Intravenous New Bag/Given 10/25/15 1510)     Initial Impression / Assessment and Plan / ED Course  I have reviewed the triage vital signs and the nursing notes.  Pertinent labs & imaging results that were available during my care of the patient were reviewed by me and considered in my medical decision making (see chart for details).  Clinical Course    82 year old male with multiple comorbidities including ESRD on dialysis presents to the ED today with significant hypotension and tachycardia. He was sent here from dialysis, after receiving 2 hours of dialysis patient's  blood pressure dropped to 70/60 and his heart rate spiked to 1:30. Presentation the ED his vital signs are similar. Patient is still alert and oriented and able to answer questions although he is still a very poor historian. He is denying any active complaints at this time. Denies any recent fevers. Unclear etiology of hypotension and tachycardia. DDX included sepsis, hypovolemia, cardiac etiology. Patient was treated with empiric antibiotics and given 1 L of fluids. EKG reveals rapid rate. Difficult to ascertain whether patient is sinus tachycardia or ectopic atrial tachycardia due to severely increased rate. Will repeat after hopefully improving tachycardia.  Unfortunate, patient's labs were drawn and his site distal to the IV and resulted in very diluted samples. We'll attempt to recontact blood draw.  Patient's pressures improving after 1.5 L of fluids. Blood pressure now 107/80. Heart rate is now 118, slightly improved.  Labs not resulting. No hyperkalemia. Creatinine is elevated at 9. However, this appears to patient's baseline. Troponin is within normal limits. No lactic acidosis or leukocytosis. Patient has still remained asymptomatic. Still uncertain etiology of hypotension. Doubt cardiac or sepsis. However, feel the patient will need observation overnight. We will consult hospitalist service.  Spoke with Dr. Adela Glimpse who will consult pt in ED for likely admission/observation.  Patient was discussed with and seen by Dr. Dalene Seltzer who agrees with the treatment plan.    Final Clinical Impressions(s) / ED Diagnoses   Final diagnoses:  Hypotension, unspecified hypotension type    New Prescriptions New Prescriptions   No medications on file     Dub Mikes, PA-C 10/25/15 1917    Alvira Monday, MD 10/28/15 2338

## 2015-10-25 NOTE — ED Notes (Signed)
Bladder scanned pt with a reading of zero.

## 2015-10-25 NOTE — ED Notes (Signed)
Called [facility] to update facility on pt's POC.  Will call tomorrow to follow up

## 2015-10-25 NOTE — Progress Notes (Addendum)
Pharmacy Antibiotic Note  [name] [name] is a 82 y.o. male admitted on 10/25/2015 with concern for sepsis.  Pharmacy has been consulted for vancomycin and zosyn dosing. Patient with ESRD and HD on MWF. From dialysis today - only tolerated half session.  Patient reports that he does not believe he received any antibiotics at dialysis center today. Patient currently afebrile, tachycardic to 130s, and hypotensive at 70s/60s. Lactate <0.30.   Vancomycin 2g IV x1 in ED   Dosing based on recent weight: 96.3 kg   Plan: Vancomycin 1g IV post-HD on MWF Pre-HD target vancomycin trough 15-25 mcg/mL Zosyn 3.375g IV q12 hr Follow-up dialysis schedule, culture results, and clinical picture  Vancomycin trough as needed   Temp (24hrs), Avg:97.5 F (36.4 C), Min:97.5 F (36.4 C), Max:97.5 F (36.4 C)    Allergies  Allergen Reactions  . Diltiazem Other (See Comments)    SEVERE STOMACH CRAMPING - Takes extended release form without problems    Antimicrobials this admission: Vanc 2 g x 1 IV in ED  9/25 Vanc >> 9/25 Zosyn >>   Dose adjustments this admission: n/a   Microbiology results: pending   Thank you for allowing pharmacy to be a part of this patient's care.  York CeriseKatherine Cook, PharmD Pharmacy Resident  Pager [phone] 10/25/15 2:50 PM

## 2015-10-26 DIAGNOSIS — N186 End stage renal disease: Secondary | ICD-10-CM | POA: Diagnosis not present

## 2015-10-26 DIAGNOSIS — I9589 Other hypotension: Secondary | ICD-10-CM | POA: Diagnosis not present

## 2015-10-26 DIAGNOSIS — I495 Sick sinus syndrome: Secondary | ICD-10-CM | POA: Diagnosis not present

## 2015-10-26 DIAGNOSIS — E1122 Type 2 diabetes mellitus with diabetic chronic kidney disease: Secondary | ICD-10-CM | POA: Diagnosis not present

## 2015-10-26 DIAGNOSIS — T82898A Other specified complication of vascular prosthetic devices, implants and grafts, initial encounter: Secondary | ICD-10-CM

## 2015-10-26 DIAGNOSIS — R791 Abnormal coagulation profile: Secondary | ICD-10-CM

## 2015-10-26 LAB — RENAL FUNCTION PANEL
ANION GAP: 16 — AB (ref 5–15)
Albumin: 3.1 g/dL — ABNORMAL LOW (ref 3.5–5.0)
BUN: 65 mg/dL — ABNORMAL HIGH (ref 6–20)
CHLORIDE: 96 mmol/L — AB (ref 101–111)
CO2: 27 mmol/L (ref 22–32)
Calcium: 9.1 mg/dL (ref 8.9–10.3)
Creatinine, Ser: 11.83 mg/dL — ABNORMAL HIGH (ref 0.61–1.24)
GFR, EST AFRICAN AMERICAN: 4 mL/min — AB (ref >60)
GFR, EST NON AFRICAN AMERICAN: 3 mL/min — AB (ref >60)
Glucose, Bld: 173 mg/dL — ABNORMAL HIGH (ref 65–99)
POTASSIUM: 4 mmol/L (ref 3.5–5.1)
Phosphorus: 6.1 mg/dL — ABNORMAL HIGH (ref 2.5–4.6)
Sodium: 139 mmol/L (ref 135–145)

## 2015-10-26 LAB — GLUCOSE, CAPILLARY
GLUCOSE-CAPILLARY: 259 mg/dL — AB (ref 65–99)
GLUCOSE-CAPILLARY: 135 mg/dL — AB (ref 65–99)
Glucose-Capillary: 224 mg/dL — ABNORMAL HIGH (ref 65–99)
Glucose-Capillary: 162 mg/dL — ABNORMAL HIGH (ref 65–99)

## 2015-10-26 LAB — CBC
HCT: 33.2 % — ABNORMAL LOW (ref 39.0–52.0)
HEMATOCRIT: 34.7 % — AB (ref 39.0–52.0)
HEMOGLOBIN: 11.2 g/dL — AB (ref 13.0–17.0)
Hemoglobin: 10.8 g/dL — ABNORMAL LOW (ref 13.0–17.0)
MCH: 30.9 pg (ref 26.0–34.0)
MCH: 30.8 pg (ref 26.0–34.0)
MCHC: 32.5 g/dL (ref 30.0–36.0)
MCHC: 32.3 g/dL (ref 30.0–36.0)
MCV: 94.9 fL (ref 78.0–100.0)
MCV: 95.3 fL (ref 78.0–100.0)
PLATELETS: 143 10*3/uL — AB (ref 150–400)
PLATELETS: 143 10*3/uL — AB (ref 150–400)
RBC: 3.5 MIL/uL — AB (ref 4.22–5.81)
RBC: 3.64 MIL/uL — AB (ref 4.22–5.81)
RDW: 15.9 % — ABNORMAL HIGH (ref 11.5–15.5)
RDW: 16 % — ABNORMAL HIGH (ref 11.5–15.5)
WBC: 5.4 10*3/uL (ref 4.0–10.5)
WBC: 5.8 10*3/uL (ref 4.0–10.5)

## 2015-10-26 LAB — COMPREHENSIVE METABOLIC PANEL
ALK PHOS: 52 U/L (ref 38–126)
ALT: 19 U/L (ref 17–63)
AST: 15 U/L (ref 15–41)
Albumin: 2.9 g/dL — ABNORMAL LOW (ref 3.5–5.0)
Anion gap: 13 (ref 5–15)
BILIRUBIN TOTAL: 0.8 mg/dL (ref 0.3–1.2)
BUN: 56 mg/dL — ABNORMAL HIGH (ref 6–20)
CALCIUM: 8.6 mg/dL — AB (ref 8.9–10.3)
CO2: 27 mmol/L (ref 22–32)
CREATININE: 9.97 mg/dL — AB (ref 0.61–1.24)
Chloride: 97 mmol/L — ABNORMAL LOW (ref 101–111)
GFR, EST AFRICAN AMERICAN: 5 mL/min — AB (ref >60)
GFR, EST NON AFRICAN AMERICAN: 4 mL/min — AB (ref >60)
Glucose, Bld: 217 mg/dL — ABNORMAL HIGH (ref 65–99)
Potassium: 4 mmol/L (ref 3.5–5.1)
Sodium: 137 mmol/L (ref 135–145)
Total Protein: 6.5 g/dL (ref 6.5–8.1)

## 2015-10-26 LAB — TROPONIN I: TROPONIN I: 0.04 ng/mL — AB (ref <0.03)

## 2015-10-26 LAB — TSH: TSH: 1.917 u[IU]/mL (ref 0.350–4.500)

## 2015-10-26 LAB — PHOSPHORUS: Phosphorus: 5.8 mg/dL — ABNORMAL HIGH (ref 2.5–4.6)

## 2015-10-26 LAB — MAGNESIUM: Magnesium: 2.4 mg/dL (ref 1.7–2.4)

## 2015-10-26 LAB — MRSA PCR SCREENING: MRSA by PCR: NEGATIVE

## 2015-10-26 MED ORDER — DOXERCALCIFEROL 4 MCG/2ML IV SOLN
2.0000 ug | INTRAVENOUS | Status: DC
  Administered 2015-10-27 – 2015-11-01 (×3): 2 ug via INTRAVENOUS
  Filled 2015-10-26 (×2): qty 2

## 2015-10-26 MED ORDER — PENTAFLUOROPROP-TETRAFLUOROETH EX AERO: 1.0000 "application " | INHALATION_SPRAY | CUTANEOUS | Status: DC | PRN

## 2015-10-26 MED ORDER — SODIUM CHLORIDE 0.9 % IV BOLUS (SEPSIS): 250.0000 mL | Freq: Once | INTRAVENOUS | Status: DC

## 2015-10-26 MED ORDER — ALTEPLASE 2 MG IJ SOLR: 2.0000 mg | Freq: Once | INTRAMUSCULAR | Status: DC | PRN

## 2015-10-26 MED ORDER — HEPARIN SODIUM (PORCINE) 1000 UNIT/ML DIALYSIS
4200.0000 [IU] | INTRAMUSCULAR | Status: DC | PRN
  Filled 2015-10-26: qty 5

## 2015-10-26 MED ORDER — LIDOCAINE HCL (PF) 1 % IJ SOLN: 5.0000 mL | INTRAMUSCULAR | Status: DC | PRN

## 2015-10-26 MED ORDER — LIDOCAINE-PRILOCAINE 2.5-2.5 % EX CREA
1.0000 "application " | TOPICAL_CREAM | CUTANEOUS | Status: DC | PRN
  Filled 2015-10-26: qty 5

## 2015-10-26 MED ORDER — SODIUM CHLORIDE 0.9 % IV SOLN
62.5000 mg | INTRAVENOUS | Status: DC
  Administered 2015-10-29: 62.5 mg via INTRAVENOUS
  Filled 2015-10-26 (×4): qty 5

## 2015-10-26 MED ORDER — SODIUM CHLORIDE 0.9 % IV SOLN: 100.0000 mL | INTRAVENOUS | Status: DC | PRN

## 2015-10-26 MED ORDER — NEPRO/CARBSTEADY PO LIQD
237.0000 mL | Freq: Two times a day (BID) | ORAL | Status: DC
  Administered 2015-10-27 – 2015-10-31 (×4): 237 mL via ORAL
  Filled 2015-10-26 (×15): qty 237

## 2015-10-26 MED ORDER — HEPARIN SODIUM (PORCINE) 1000 UNIT/ML DIALYSIS: 1000.0000 [IU] | INTRAMUSCULAR | Status: DC | PRN

## 2015-10-26 NOTE — Care Management Obs Status (Signed)
MEDICARE OBSERVATION STATUS NOTIFICATION   Patient Details  Name: [name] [name] MRN: [id number] Date of Birth: [birth year]   Medicare Observation Status Notification Given:  Yes    Darrold SpanWebster, Deunta Beneke Hall, RN 10/26/2015, 11:37 AM

## 2015-10-26 NOTE — Evaluation (Signed)
Physical Therapy Evaluation Patient Details Name: [name] [name] MRN: [id number] DOB: 08/31/1933 Today's Date: 10/26/2015   History of Present Illness  [name] [name] is a 82 y.o. male with PMHx:ESRD on HD MWF, HTN, diabetes, atrial fibrillation with RVR. Admitted from HD with hypotension  Clinical Impression  Pt very pleasant and demonstrates increased strength and function from last admission but continues to require assist for all mobility, unable to stand and ambulate. Pt educated for transfers, HEp and encouraged to continue mobility with nursing assist. Pt with decreased strength, function and mobility who will benefit from acute therapy to maximize mobility, function and independence.     Follow Up Recommendations SNF;Supervision/Assistance - 24 hour    Equipment Recommendations  None recommended by PT    Recommendations for Other Services       Precautions / Restrictions Precautions Precautions: Fall      Mobility  Bed Mobility Overal bed mobility: Needs Assistance Bed Mobility: Supine to Sit     Supine to sit: Supervision;HOB elevated     General bed mobility comments: with rail and HOb 30 degrees and increased time pt able to pivot to EOB. In sitting needs constant cues for balance due to posterior lean and pt unaware  Transfers Overall transfer level: Needs assistance   Transfers: Sit to/from [employer] Transfers Sit to Stand: Mod assist;From elevated surface   Squat pivot transfers: Mod assist     General transfer comment: with left knee blocked, assist and cues for anterior translation pt able to stand from bed x 2 however maintains flexed posture of trunk/knee/hips, Pt able to stand with RW for 1 min for pericare. Squat pivot with cues for hand placement and assist to pivot pelvis to chair  Ambulation/Gait                Stairs            Wheelchair Mobility    Modified Rankin (Stroke Patients Only)       Balance Overall  balance assessment: Needs assistance   Sitting balance-Leahy Scale: Poor   Postural control: Posterior lean   Standing balance-Leahy Scale: Poor                               Pertinent Vitals/Pain Pain Assessment: No/denies pain    Home Living Family/patient expects to be discharged to:: Skilled nursing facility   Available Help at Discharge: Family Type of Home: House Home Access: Stairs to enter   Secretary/administrator of Steps: 1 Home Layout: One level Home Equipment: Cane - single point;Walker - 2 wheels      Prior Function Level of Independence: Needs assistance   Gait / Transfers Assistance Needed: pt reports he has been transferring to a WC and has walked "a little bit"  ADL's / Homemaking Assistance Needed: assist from wife at home and since SNF max assist for ADLs        Hand Dominance        Extremity/Trunk Assessment   Upper Extremity Assessment: Generalized weakness           Lower Extremity Assessment: Generalized weakness      Cervical / Trunk Assessment: Kyphotic  Communication      Cognition Arousal/Alertness: Awake/alert Behavior During Therapy: Flat affect Overall Cognitive Status: Within Functional Limits for tasks assessed  General Comments      Exercises General Exercises - Lower Extremity Long Arc Quad: AROM;Both;Seated;10 reps Hip Flexion/Marching: AROM;Both;Seated;10 reps   Assessment/Plan    PT Assessment Patient needs continued PT services  PT Problem List Decreased strength;Decreased activity tolerance;Decreased balance;Decreased mobility;Decreased coordination;Decreased knowledge of use of DME;Decreased safety awareness          PT Treatment Interventions DME instruction;Gait training;Functional mobility training;Therapeutic activities;Therapeutic exercise;Balance training;Patient/family education    PT Goals (Current goals can be found in the Care Plan section)  Acute  Rehab PT Goals Patient Stated Goal: be able to walk PT Goal Formulation: With patient Time For Goal Achievement: 11/09/15 Potential to Achieve Goals: Fair    Frequency Min 2X/week   Barriers to discharge Decreased caregiver support      Co-evaluation               End of Session Equipment Utilized During Treatment: Gait belt Activity Tolerance: Patient tolerated treatment well Patient left: in chair;with call bell/phone within reach;with chair alarm set;with nursing/sitter in room Nurse Communication: Mobility status;Precautions    Functional Assessment Tool Used: Clinical Judgement Functional Limitation: Mobility: Walking and moving around Mobility: Walking and Moving Around Current Status [phone](G8978): At least 60 percent but less than 80 percent impaired, limited or restricted Mobility: Walking and Moving Around Goal Status [phone](G8979): At least 20 percent but less than 40 percent impaired, limited or restricted    Time: 1057-1116 PT Time Calculation (min) (ACUTE ONLY): 19 min   Charges:   PT Evaluation $PT Eval Moderate Complexity: 1 Procedure     PT G Codes:   PT G-Codes **NOT FOR INPATIENT CLASS** Functional Assessment Tool Used: Clinical Judgement Functional Limitation: Mobility: Walking and moving around Mobility: Walking and Moving Around Current Status ([id number]): At least 60 percent but less than 80 percent impaired, limited or restricted Mobility: Walking and Moving Around Goal Status [phone](G8979): At least 20 percent but less than 40 percent impaired, limited or restricted    Delorse Lekabor, Cason Dabney Beth 10/26/2015, 11:27 AM Delaney MeigsMaija Tabor Nakeesha Bowler, PT [phone]

## 2015-10-26 NOTE — Progress Notes (Signed)
Inpatient Diabetes Program Recommendations  AACE/ADA: New Consensus Statement on Inpatient Glycemic Control (2015)  Target Ranges:  Prepandial:   less than 140 mg/dL      Peak postprandial:   less than 180 mg/dL (1-2 hours)      Critically ill patients:  140 - 180 mg/dL   Lab Results  Component Value Date   GLUCAP 259 (H) 10/26/2015   HGBA1C 8.8 (H) 09/22/2015    Review of Glycemic Control  Diabetes history: DM2 Outpatient Diabetes medications: None Current orders for Inpatient glycemic control: Novolog sensitive tidwc and hs  Blood sugars in 200s.  Inpatient Diabetes Program Recommendations:    Consider addition of Lantus 15 units QHS.  Will continue to follow. Thank you. [name] [name], RD, LDN, CDE Inpatient Diabetes Coordinator  [phone]

## 2015-10-26 NOTE — Progress Notes (Signed)
Initial Nutrition Assessment  DOCUMENTATION CODES:   Not applicable  INTERVENTION:    Nepro Shake po BID, each supplement provides 425 kcal and 19 grams protein  NUTRITION DIAGNOSIS:   Increased nutrient needs related to chronic illness as evidenced by estimated needs  GOAL:   Patient will meet greater than or equal to 90% of their needs  MONITOR:   PO intake, Supplement acceptance, Labs, Weight trends, I & O's  REASON FOR ASSESSMENT:   Consult Assessment of nutrition requirement/status  ASSESSMENT:   82 y.o. male with PMHx: ESRD on HD MWF, HTN, diabetes, atrial fibrillation with RVR. Admitted from HD with hypotension.  Patient reports his appetite is ok. PO intake 50% per flowsheet records. Likes Nepro Shake supplements >> will order. Weight fluctuates but has been stable. Nutrition focused physical exam completed.  No muscle or subcutaneous fat depletion noticed.  Diet Order:  Diet renal/carb modified with fluid restriction Diet-HS Snack? Nothing; Room service appropriate? Yes; Fluid consistency: Thin  Skin:  Reviewed, no issues  Last BM:  9/23  Height:   Ht Readings from Last 1 Encounters:  10/25/15 6\' 5"  (1.956 m)    Weight:   Wt Readings from Last 1 Encounters:  10/25/15 221 lb 12.5 oz (100.6 kg)   Wt Readings from Last 10 Encounters:  10/25/15 221 lb 12.5 oz (100.6 kg)  10/08/15 212 lb 4.9 oz (96.3 kg)  09/24/15 214 lb 11.7 oz (97.4 kg)  08/31/15 229 lb 6.4 oz (104.1 kg)  08/15/15 227 lb 3.2 oz (103.1 kg)  06/21/15 218 lb 0.9 oz (98.9 kg)  06/08/15 220 lb 1.9 oz (99.8 kg)  04/23/15 214 lb 1.1 oz (97.1 kg)  04/16/15 192 lb 14.4 oz (87.5 kg)  04/08/15 227 lb 11.8 oz (103.3 kg)    Ideal Body Weight:  94.5 kg  BMI:  Body mass index is 26.3 kg/m.  Estimated Nutritional Needs:   Kcal:  2200-2400  Protein:  115-125 gm  Fluid:  1200 ml  EDUCATION NEEDS:   No education needs identified at this time  Maureen ChattersKatie Tajuana Kniskern, RD, LDN Pager #:  [phone] After-Hours Pager #: [phone]

## 2015-10-26 NOTE — Progress Notes (Signed)
PROGRESS NOTE  [name] AMBROCIO ZOX:[id number] DOB: 02/23/1933 DOA: 10/25/2015 PCP: Katy Apo, MD  HPI/Recap of past 24 hours:  Chronically ill, but aaox3, NAD when laying in bed,  He report no appetite, report n/v while at snf, none since being admitted,  report some weight loss recently he was sent from HD to ED due to hypotension during dialysis No fever, He refuses to have more blood draw this am, states he does want to continue dialysis  Assessment/Plan: Active Problems:   Peripheral neuropathy (HCC)   Diabetes mellitus with ESRD (end-stage renal disease) (HCC)   Sinus bradycardia-tachycardia syndrome (HCC)   PAF (paroxysmal atrial fibrillation) (HCC)   Physical deconditioning   Congestive heart failure (HCC)   Essential hypertension   ESRD on dialysis (HCC)   Hypotension   Hypotension in the setting of hemodialysis. Possibly secondary to fluid shifts. Also recent nausea and vomiting. Blood pressure currently stabilized. No evidence for sepsis. Hold off on IV antibiotics for now. I have discussed with nephrology about possible adjusting dry weight target. Will check am cortisol level. . Sinus bradycardia-tachycardia syndrome (HCC) stable continue home medications with holding parameters . Physical deconditioning will need to discharge with PT OT follow-up . PAF (paroxysmal atrial fibrillation) (HCC) [place] vasc 2 score of 5. Supratherapeutic INR will hold Coumadin for now. Given hypotension will only restart metoprolol with holding parameters and would attempt to restart Cardizem once blood pressure stabilized . F . Essential hypertension hold home medications given recent hypotension, he received total of 2liter fluids since admission.  . Diabetes mellitus with ESRD (end-stage renal disease) (HCC) with a sliding scale End-stage renal disease on HD MWF, nephrology input appreciated  Recent frequent hospitalization, if does not improve, and no reversible causes, may  consider palliative care consult.   DVT prophylaxis:  SCD    Code Status:    DNR/DNI as per patient   Family Communication:   Family not at  Bedside   Disposition Plan:                              Back to current facility when stable                                                  Social Work   Nutrition                              Consultants:  neprhology  Procedures:  HD  Antibiotics:  none   Objective: BP 102/76 (BP Location: Right Arm)   Pulse (!) 115   Temp 97.4 F (36.3 C) (Oral)   Resp 20   Ht 6\' 5"  (1.956 m)   Wt 100.6 kg (221 lb 12.5 oz)   SpO2 98%   BMI 26.30 kg/m  No intake or output data in the 24 hours ending 10/26/15 0842 Filed Weights   10/25/15 2140  Weight: 100.6 kg (221 lb 12.5 oz)    Exam:   General:  Chronically ill, NAD, left eye blind ( chronic)  Cardiovascular: IRRR  Respiratory: CTABL  Abdomen: Soft/ND/NT, positive BS  Musculoskeletal: No Edema  Neuro: aaox3  Data Reviewed: Basic Metabolic Panel:  Recent Labs Lab 10/25/15 1727 10/26/15 0317  NA 137 137  K 4.0 4.0  CL 97* 97*  CO2 23 27  GLUCOSE 164* 217*  BUN 49* 56*  CREATININE 9.16* 9.97*  CALCIUM 8.8* 8.6*  MG  --  2.4  PHOS  --  5.8*   Liver Function Tests:  Recent Labs Lab 10/25/15 1727 10/26/15 0317  AST 20 15  ALT 22 19  ALKPHOS 56 52  BILITOT 1.0 0.8  PROT 7.4 6.5  ALBUMIN 3.2* 2.9*   No results for input(s): LIPASE, AMYLASE in the last 168 hours. No results for input(s): AMMONIA in the last 168 hours. CBC:  Recent Labs Lab 10/25/15 1727 10/26/15 0317  WBC 6.2 5.4  NEUTROABS 3.5  --   HGB 13.0 10.8*  HCT 39.6 33.2*  MCV 93.8 94.9  PLT 167 143*   Cardiac Enzymes:    Recent Labs Lab 10/25/15 2146 10/26/15 0317  TROPONINI 0.04* 0.04*   BNP (last 3 results)  Recent Labs  08/12/15 2143 09/22/15 0833 10/25/15 1727  BNP 276.9* 568.9* 580.8*    ProBNP (last 3 results) No results for input(s): PROBNP in the last  8760 hours.  CBG:  Recent Labs Lab 10/25/15 2224 10/26/15 0612  GLUCAP 236* 224*    Recent Results (from the past 240 hour(s))  MRSA PCR Screening     Status: None   Collection Time: 10/25/15 11:51 PM  Result Value Ref Range Status   MRSA by PCR NEGATIVE NEGATIVE Final    Comment:        The GeneXpert MRSA Assay (FDA approved for NASAL specimens only), is one component of a comprehensive MRSA colonization surveillance program. It is not intended to diagnose MRSA infection nor to guide or monitor treatment for MRSA infections.      Studies: Dg Chest Portable 1 View  Result Date: 10/25/2015 CLINICAL DATA:  Hypotension, history of atrial flutter and bradycardia. History of diabetes, end-stage renal disease EXAM: PORTABLE CHEST 1 VIEW COMPARISON:  PA and lateral chest x-ray of October 04, 2015 FINDINGS: The lungs are mildly hypoinflated. The interstitial markings are mildly increased but are not as conspicuous as on the study of October 04, 2015. The cardiac silhouette remains mildly enlarged. The pulmonary vascularity is not clearly engorged. The mediastinum is normal in width. The bony thorax exhibits no acute abnormality. IMPRESSION: Improved appearance of the pulmonary interstitium since the previous study. Stable mild cardiomegaly without over pulmonary vascular congestion. No evidence of pneumonia. Electronically Signed   By: David  SwazilandJordan M.D.   On: 10/25/2015 15:17    Scheduled Meds: . atorvastatin  10 mg Oral q1800  . calcium acetate  1,334 mg Oral TID WC  . cinacalcet  60 mg Oral Q supper  . docusate sodium  100 mg Oral BID  . dorzolamide  1 drop Right Eye BID   And  . timolol  1 drop Right Eye BID  . insulin aspart  0-5 Units Subcutaneous QHS  . insulin aspart  0-9 Units Subcutaneous TID WC  . latanoprost  1 drop Right Eye QHS  . metoprolol  25 mg Oral Q6H  . prednisoLONE acetate  1 drop Left Eye BID  . sodium chloride flush  3 mL Intravenous Q12H  . sodium  chloride flush  3 mL Intravenous Q12H  . tobramycin-dexamethasone  1 drop Left Eye QID    Continuous Infusions:    Time spent: 35mins  Leonard Feigel MD, PhD  [facility] Pager [phone]. If 7PM-7AM, please contact night-coverage at [email or web address], password [email or web address] 10/26/2015, 8:42 AM  LOS:  0 days

## 2015-10-26 NOTE — Progress Notes (Signed)
ANTICOAGULATION CONSULT NOTE - Initial Consult  Pharmacy Consult for warfarin Indication: atrial fibrillation  Allergies  Allergen Reactions  . Diltiazem Other (See Comments)    SEVERE STOMACH CRAMPING - Takes extended release form without problems    Patient Measurements: Height: 6\' 5"  (195.6 cm) Weight: 221 lb 12.5 oz (100.6 kg) IBW/kg (Calculated) : 89.1  Vital Signs: Temp: 97.5 F (36.4 C) (09/26 1300) Temp Source: Oral (09/26 1300) BP: 103/75 (09/26 1300) Pulse Rate: 122 (09/26 1300)  Labs:  Recent Labs  10/25/15 1727 10/25/15 2146 10/26/15 0317  HGB 13.0  --  10.8*  HCT 39.6  --  33.2*  PLT 167  --  143*  LABPROT 40.3*  --   --   INR 4.04*  --   --   CREATININE 9.16*  --  9.97*  TROPONINI  --  0.04* 0.04*   Estimated Creatinine Clearance: 7.4 mL/min (by C-G formula based on SCr of 9.97 mg/dL (H)).  Medical History: Past Medical History:  Diagnosis Date  . Anemia   . Atrial flutter (HCC)   . Bradycardia    a. unable to tolerate beta blockers in the past.  . Diabetes mellitus (HCC)   . ESRD (end stage renal disease) on dialysis [facility])    "MWF; [address]" (04/08/2015)  . Glaucoma   . Gout   . Hypertension   . PAF (paroxysmal atrial fibrillation) (HCC)   . Peripheral neuropathy (HCC)   . PSVT (paroxysmal supraventricular tachycardia) (HCC)    a. h/o ablation.    Medications:  Prescriptions Prior to Admission  Medication Sig Dispense Refill Last Dose  . acetaminophen (TYLENOL) 325 MG tablet Take 650 mg by mouth daily as needed for mild pain.   unknown  . atorvastatin (LIPITOR) 10 MG tablet Take 1 tablet (10 mg total) by mouth daily at 6 PM. 30 tablet 6 10/24/2015 at Unknown time  . B Complex-C-Folic Acid (RENA-VITE PO) Take 1 tablet by mouth daily.   10/25/2015 at Unknown time  . Calcium Acetate 667 MG TABS Take 667-1,334 mg by mouth See admin instructions. 667 mg for snacks, 1334 mg for meals   10/25/2015 at Unknown time  . diltiazem (CARDIZEM  CD) 360 MG 24 hr capsule Take 1 capsule (360 mg total) by mouth daily. 30 capsule 6 10/25/2015 at Unknown time  . docusate sodium (COLACE) 100 MG capsule Take 100 mg by mouth 2 (two) times daily.    10/25/2015 at Unknown time  . dorzolamide-timolol (COSOPT) 22.3-6.8 MG/ML ophthalmic solution Place 1 drop into the right eye 2 (two) times daily.    10/25/2015 at Unknown time  . lidocaine-prilocaine (EMLA) cream Apply 1 application topically as needed (apply to skin near dialysis port before dialysis).    10/25/2015 at Unknown time  . metoprolol (LOPRESSOR) 50 MG tablet Take 1 tablet (50 mg total) by mouth 2 (two) times daily. 60 tablet 6 10/25/2015 at 0800  . neomycin-polymyxin b-dexamethasone (MAXITROL) 3.5-10000-0.1 SUSP Place 1 drop into the left eye 4 (four) times daily.   10/25/2015 at Unknown time  . prednisoLONE acetate (PRED FORTE) 1 % ophthalmic suspension Place 1 drop into the left eye 2 (two) times daily. Patient to use for 37 days. Filled on 03-28-15   10/25/2015 at Unknown time  . SENSIPAR 60 MG tablet Take 60 mg by mouth at bedtime.    10/24/2015 at Unknown time  . tobramycin-dexamethasone (TOBRADEX) ophthalmic solution Place 1 drop into the left eye 4 (four) times daily. For  37 days. Filled 03-31-15   10/25/2015 at Unknown time  . TRAVATAN Z 0.004 % SOLN ophthalmic solution Place 1 drop into the right eye at bedtime.    10/24/2015 at Unknown time  . warfarin (COUMADIN) 5 MG tablet Take as directed by [facility] (Patient taking differently: Take 6 mg by mouth See admin instructions. 7.5 mg daily) 50 tablet 3 10/24/2015 at Unknown time   Assessment: 82 y.o.maleadmittedon 9/25/2017with hypotension. Initially treated empirically for sepsis, now thought to be caused by dehydration. Pharmacy has been consulted for warfarin dosing, however will wait to assess dosing needs until tomorrow after morning labs. Patient's INR 4.04 yesterday at admission, and no INR was ordered for today. Suspect INR will  still be supratherapeutic tonight and should not have fallen below the therapeutic goal in one day.   Goal of Therapy:  INR 2-3 Monitor platelets by anticoagulation protocol: Yes   Plan:  Hold warfarin tonight  INR ordered with AM labs Monitor CBC and for s/sx of bleeding  Ruben Imony Markea Ruzich, PharmD Clinical Pharmacist Pager: [phone] 10/26/2015 3:17 PM

## 2015-10-26 NOTE — Consult Note (Signed)
[facility]  Patient name: [name] [name] MRN: [id number] DOB: 1933-05-05 Sex: male  REASON FOR CONSULT: erosions left upper arm fistula, consult is from Dr. Signe Colt with [facility]  HPI: [name] [name] is a 82 y.o. male, who presents for evaluation of left upper arm fistula erosion. He had a left upper arm fistula created by Dr. Arbie Cookey six years ago. He dialyzes on Mondays, Wednesdays, Fridays. He denies any bleeding issues with his fistula. He is accompanied by his wife. He has never had access in the right arm.   The patient presented to [facility] ED on 10/25/15 from his dialysis center after he was found be hypotensive with generalized fatigue during his session. He only completed half of his session. His systolic blood pressure on arrival was in the 70s and he was tachycardic.   He was recently admitted on 10/04/15 for generalized weakness and acute respiratory failure with hypoxia. His generalized weakness was felt to be secondary to severe DJD and C spine and minimal central disc protrusion. Neurosurgery did not recommend surgery.   He has a past medical history of chronic atrial fibrillation on coumadin. He currently resides in a SNF due to generalized weakness limiting mobility.   Past Medical History:  Diagnosis Date  . Anemia   . Atrial flutter (HCC)   . Bradycardia    a. unable to tolerate beta blockers in the past.  . Diabetes mellitus (HCC)   . ESRD (end stage renal disease) on dialysis [facility])    "MWF; [address]" (04/08/2015)  . Glaucoma   . Gout   . Hypertension   . PAF (paroxysmal atrial fibrillation) (HCC)   . Peripheral neuropathy (HCC)   . PSVT (paroxysmal supraventricular tachycardia) (HCC)    a. h/o ablation.    Family History  Problem Relation Age of Onset  . Other Mother     bowel obstruction  . Heart failure Father     fluid bluid up  . Alcoholism Brother   . Alcoholism Brother     SOCIAL HISTORY: Social  History   Social History  . Marital status: Married    Spouse name: N/A  . Number of children: N/A  . Years of education: N/A   Occupational History  . Not on file.   Social History Main Topics  . Smoking status: Never Smoker  . Smokeless tobacco: Never Used  . Alcohol use No  . Drug use: No  . Sexual activity: Not on file   Other Topics Concern  . Not on file   Social History Narrative  . No narrative on file    Allergies  Allergen Reactions  . Diltiazem Other (See Comments)    SEVERE STOMACH CRAMPING - Takes extended release form without problems    Current Facility-Administered Medications  Medication Dose Route Frequency Provider Last Rate Last Dose  . 0.9 %  sodium chloride infusion  250 mL Intravenous PRN Therisa Doyne, MD      . acetaminophen (TYLENOL) tablet 650 mg  650 mg Oral Q6H PRN Therisa Doyne, MD       Or  . acetaminophen (TYLENOL) suppository 650 mg  650 mg Rectal Q6H PRN Therisa Doyne, MD      . atorvastatin (LIPITOR) tablet 10 mg  10 mg Oral q1800 Therisa Doyne, MD      . calcium acetate (PHOSLO) capsule 1,334 mg  1,334 mg Oral TID WC Therisa Doyne, MD   1,334 mg at  10/26/15 1018  . cinacalcet (SENSIPAR) tablet 60 mg  60 mg Oral Q supper Therisa DoyneAnastassia Doutova, MD      . docusate sodium (COLACE) capsule 100 mg  100 mg Oral BID Therisa DoyneAnastassia Doutova, MD   100 mg at 10/26/15 0006  . dorzolamide (TRUSOPT) 2 % ophthalmic solution 1 drop  1 drop Right Eye BID Therisa DoyneAnastassia Doutova, MD   1 drop at 10/26/15 1019   And  . timolol (TIMOPTIC) 0.5 % ophthalmic solution 1 drop  1 drop Right Eye BID Therisa DoyneAnastassia Doutova, MD   1 drop at 10/26/15 1019  . feeding supplement (NEPRO CARB STEADY) liquid 237 mL  237 mL Oral BID BM Albertine GratesFang Xu, MD      . insulin aspart (novoLOG) injection 0-5 Units  0-5 Units Subcutaneous QHS Therisa DoyneAnastassia Doutova, MD   2 Units at 10/26/15 0007  . insulin aspart (novoLOG) injection 0-9 Units  0-9 Units Subcutaneous TID WC Therisa DoyneAnastassia  Doutova, MD   5 Units at 10/26/15 1259  . latanoprost (XALATAN) 0.005 % ophthalmic solution 1 drop  1 drop Right Eye QHS Therisa DoyneAnastassia Doutova, MD   1 drop at 10/26/15 0012  . metoprolol tartrate (LOPRESSOR) tablet 25 mg  25 mg Oral Q6H Therisa DoyneAnastassia Doutova, MD   25 mg at 10/26/15 1300  . ondansetron (ZOFRAN) tablet 4 mg  4 mg Oral Q6H PRN Therisa DoyneAnastassia Doutova, MD       Or  . ondansetron (ZOFRAN) injection 4 mg  4 mg Intravenous Q6H PRN Therisa DoyneAnastassia Doutova, MD      . prednisoLONE acetate (PRED FORTE) 1 % ophthalmic suspension 1 drop  1 drop Left Eye BID Therisa DoyneAnastassia Doutova, MD   1 drop at 10/26/15 1019  . sodium chloride 0.9 % bolus 250 mL  250 mL Intravenous Once Albertine GratesFang Xu, MD      . sodium chloride flush (NS) 0.9 % injection 3 mL  3 mL Intravenous Q12H Therisa DoyneAnastassia Doutova, MD      . sodium chloride flush (NS) 0.9 % injection 3 mL  3 mL Intravenous Q12H Therisa DoyneAnastassia Doutova, MD   3 mL at 10/26/15 1000  . sodium chloride flush (NS) 0.9 % injection 3 mL  3 mL Intravenous PRN Therisa DoyneAnastassia Doutova, MD      . tobramycin-dexamethasone (TOBRADEX) ophthalmic suspension 1 drop  1 drop Left Eye QID Therisa DoyneAnastassia Doutova, MD   1 drop at 10/26/15 1019    REVIEW OF SYSTEMS:  [X]  denotes positive finding, [ ]  denotes negative finding Cardiac  Comments:  Chest pain or chest pressure:    Shortness of breath upon exertion:    Short of breath when lying flat:    Irregular heart rhythm:        Vascular    Pain in calf, thigh, or hip brought on by ambulation:    Pain in feet at night that wakes you up from your sleep:     Blood clot in your veins:    Leg swelling:         Pulmonary    Oxygen at home:    Productive cough:     Wheezing:         Neurologic    Sudden weakness in arms or legs:     Sudden numbness in arms or legs:     Sudden onset of difficulty speaking or slurred speech:    Temporary loss of vision in one eye:     Problems with dizziness:         Gastrointestinal  Blood in stool:     Vomited blood:          Genitourinary    Burning when urinating:     Blood in urine:        Psychiatric    Major depression:         Hematologic    Bleeding problems:    Problems with blood clotting too easily:        Skin    Rashes or ulcers:        Constitutional    Fever or chills:      PHYSICAL EXAM: Vitals:   10/25/15 2140 10/26/15 0520 10/26/15 0600 10/26/15 1300  BP: 109/85 98/74 102/76 103/75  Pulse: (!) 115 92 (!) 115 (!) 122  Resp: 20 20    Temp: 98.1 F (36.7 C) 97.4 F (36.3 C)  97.5 F (36.4 C)  TempSrc: Oral Oral  Oral  SpO2: 96% 98%  100%  Weight: 221 lb 12.5 oz (100.6 kg)     Height: 6\' 5"  (1.956 m)       GENERAL: The patient is a well-nourished male, in no acute distress. A&O x 3. The vital signs are documented above. CARDIAC: irregularly irregular.  VASCULAR: Diminished radial pulses bilaterally. Palpable thrill left upper arm fistula. There are 2 aneurysms at the distal fistula. No ulcerations of fistula.  PULMONARY: There is good air exchange bilaterally without wheezing or rales. MUSCULOSKELETAL: No edema lower extremities.  NEUROLOGIC: No focal weakness or paresthesias are detected. SKIN: There are no ulcers or rashes noted. PSYCHIATRIC: The patient has a normal affect.   MEDICAL ISSUES:  ANEURYSMAL LEFT UPPER ARM AV FISTULA: This patient has had a fistula in the left upper arm for many years. I do not see any eschars that her risk for bleeding currently. Regardless, it looks like they have been cannulating the same site for many years and I would recommend sticking above and below the areas of concern and not sticking the aneurysmal areas. I discussed with him the option of electively addressing these aneurysms however he absolutely refuses to consider any surgery on the fistula. I have explained that if these progress he could be a risk for bleeding which could be life-threatening. He understands this and does not wish any surgery. For now I would simply not  cannulate the fistula near these areas of concern. We would be happy to see him as an outpatient if he changes his mind about considering revision of his fistula electively.  Maris Berger, PA-C [facility] [place] [phone]  Waverly Ferrari, MD, FACS Beeper [phone] Office: [phone]

## 2015-10-26 NOTE — Consult Note (Addendum)
Reason for Consult: To manage dialysis and dialysis related needs Referring Physician: Dr. Vashti Hey is an 82 y.o. male.  HPI: Pt is an 82 M with ESRD on HD , atrial fibrillation s/p ablation, DM II , HTN, and gout who presented to [facility] after becoming hypotensive in dialysis.  He had a partial run of HD yesterday and began to have Afib with RVR and hypotension accompanied by chest pressure. Treatment was terminated and he was sent to the ED for further management.    Pt feels better today.  He notes that he feels a little bit weak but overall better than he did.  He is asking when he will be discharged.  Dialyzes at [place] 97.5. HD 4 hrs Bath 2K / 2Ca, Dialyzer F180, Heparin 4200 bolus Access LUE AVF  Past Medical History:  Diagnosis Date  . Anemia   . Atrial flutter ([place])   . Bradycardia    a. unable to tolerate beta blockers in the past.  . Diabetes mellitus ([place])   . ESRD (end stage renal disease) on dialysis [facility])    "MWF; [facility]" (04/08/2015)  . Glaucoma   . Gout   . Hypertension   . PAF (paroxysmal atrial fibrillation) ([place])   . Peripheral neuropathy ([place])   . PSVT (paroxysmal supraventricular tachycardia) ([place])    a. h/o ablation.    Past Surgical History:  Procedure Laterality Date  . ABLATION OF DYSRHYTHMIC FOCUS    . SP AV DIALYSIS SHUNT ACCESS EXISTING *L*      Family History  Problem Relation Age of Onset  . Other Mother     bowel obstruction  . Heart failure Father     fluid bluid up  . Alcoholism Brother   . Alcoholism Brother     Social History:  reports that he has never smoked. He has never used smokeless tobacco. He reports that he does not drink alcohol or use drugs.  Allergies:  Allergies  Allergen Reactions  . Diltiazem Other (See Comments)    SEVERE STOMACH CRAMPING - Takes extended release form without problems    Medications: I have reviewed the patient's current medications. Hectorol 2 mcg q treatment Mircera 50 q  4 weeks (given 9/13) Venofer 50 mg q weekly  Results for orders placed or performed during the hospital encounter of 10/25/15 (from the past 48 hour(s))  I-stat troponin, ED     Status: None   Collection Time: 10/25/15  3:08 PM  Result Value Ref Range   Troponin i, poc 0.00 0.00 - 0.08 ng/mL   Comment 3            Comment: Due to the release kinetics of cTnI, a negative result within the first hours of the onset of symptoms does not rule out myocardial infarction with certainty. If myocardial infarction is still suspected, repeat the test at appropriate intervals.   I-Stat CG4 Lactic Acid, ED     Status: Abnormal   Collection Time: 10/25/15  3:11 PM  Result Value Ref Range   Lactic Acid, Venous <0.30 (L) 0.5 - 1.9 mmol/L  Culture, blood (routine x 2)     Status: None (Preliminary result)   Collection Time: 10/25/15  5:27 PM  Result Value Ref Range   Specimen Description BLOOD RIGHT WRIST    Special Requests BOTTLES DRAWN AEROBIC AND ANAEROBIC 5CC    Culture NO GROWTH < 24 HOURS    Report Status PENDING  Protime-INR     Status: Abnormal   Collection Time: 10/25/15  5:27 PM  Result Value Ref Range   Prothrombin Time 40.3 (H) 11.4 - 15.2 seconds   INR 4.04 (HH)     Comment: REPEATED TO VERIFY CRITICAL RESULT CALLED TO, READ BACK BY AND VERIFIED WITH: K PELKEY,RN 1810 10/25/15 D BRADLEY   Brain natriuretic peptide     Status: Abnormal   Collection Time: 10/25/15  5:27 PM  Result Value Ref Range   B Natriuretic Peptide 580.8 (H) 0.0 - 100.0 pg/mL  CBC with Differential     Status: Abnormal   Collection Time: 10/25/15  5:27 PM  Result Value Ref Range   WBC 6.2 4.0 - 10.5 K/uL   RBC 4.22 4.22 - 5.81 MIL/uL   Hemoglobin 13.0 13.0 - 17.0 g/dL   HCT 39.6 39.0 - 52.0 %   MCV 93.8 78.0 - 100.0 fL   MCH 30.8 26.0 - 34.0 pg   MCHC 32.8 30.0 - 36.0 g/dL   RDW 15.8 (H) 11.5 - 15.5 %   Platelets 167 150 - 400 K/uL   Neutrophils Relative % 55 %   Neutro Abs 3.5 1.7 - 7.7 K/uL    Lymphocytes Relative 32 %   Lymphs Abs 2.0 0.7 - 4.0 K/uL   Monocytes Relative 11 %   Monocytes Absolute 0.7 0.1 - 1.0 K/uL   Eosinophils Relative 1 %   Eosinophils Absolute 0.0 0.0 - 0.7 K/uL   Basophils Relative 1 %   Basophils Absolute 0.0 0.0 - 0.1 K/uL  Comprehensive metabolic panel     Status: Abnormal   Collection Time: 10/25/15  5:27 PM  Result Value Ref Range   Sodium 137 135 - 145 mmol/L   Potassium 4.0 3.5 - 5.1 mmol/L   Chloride 97 (L) 101 - 111 mmol/L   CO2 23 22 - 32 mmol/L   Glucose, Bld 164 (H) 65 - 99 mg/dL   BUN 49 (H) 6 - 20 mg/dL   Creatinine, Ser 9.16 (H) 0.61 - 1.24 mg/dL   Calcium 8.8 (L) 8.9 - 10.3 mg/dL   Total Protein 7.4 6.5 - 8.1 g/dL   Albumin 3.2 (L) 3.5 - 5.0 g/dL   AST 20 15 - 41 U/L   ALT 22 17 - 63 U/L   Alkaline Phosphatase 56 38 - 126 U/L   Total Bilirubin 1.0 0.3 - 1.2 mg/dL   GFR calc non Af Amer 5 (L) >60 mL/min   GFR calc Af Amer 5 (L) >60 mL/min    Comment: (NOTE) The eGFR has been calculated using the CKD EPI equation. This calculation has not been validated in all clinical situations. eGFR's persistently <60 mL/min signify possible Chronic Kidney Disease.    Anion gap 17 (H) 5 - 15  I-stat troponin, ED     Status: None   Collection Time: 10/25/15  5:48 PM  Result Value Ref Range   Troponin i, poc 0.05 0.00 - 0.08 ng/mL   Comment 3            Comment: Due to the release kinetics of cTnI, a negative result within the first hours of the onset of symptoms does not rule out myocardial infarction with certainty. If myocardial infarction is still suspected, repeat the test at appropriate intervals.   I-Stat CG4 Lactic Acid, ED     Status: None   Collection Time: 10/25/15  5:51 PM  Result Value Ref Range   Lactic Acid, Venous 1.87 0.5 -  1.9 mmol/L  Urinalysis, Routine w reflex microscopic     Status: Abnormal   Collection Time: 10/25/15  9:05 PM  Result Value Ref Range   Color, Urine YELLOW YELLOW   APPearance TURBID (A) CLEAR    Specific Gravity, Urine 1.023 1.005 - 1.030   pH 6.5 5.0 - 8.0   Glucose, UA 100 (A) NEGATIVE mg/dL   Hgb urine dipstick LARGE (A) NEGATIVE   Bilirubin Urine SMALL (A) NEGATIVE   Ketones, ur 15 (A) NEGATIVE mg/dL   Protein, ur >300 (A) NEGATIVE mg/dL   Nitrite POSITIVE (A) NEGATIVE   Leukocytes, UA LARGE (A) NEGATIVE  Urine microscopic-add on     Status: Abnormal   Collection Time: 10/25/15  9:05 PM  Result Value Ref Range   Squamous Epithelial / LPF 6-30 (A) NONE SEEN   WBC, UA TOO NUMEROUS TO COUNT 0 - 5 WBC/hpf   RBC / HPF 6-30 0 - 5 RBC/hpf   Bacteria, UA MANY (A) NONE SEEN   Urine-Other LESS THAN 10 mL OF URINE SUBMITTED     Comment: MICROSCOPIC EXAM PERFORMED ON UNCONCENTRATED URINE  Troponin I     Status: Abnormal   Collection Time: 10/25/15  9:46 PM  Result Value Ref Range   Troponin I 0.04 (HH) <0.03 ng/mL    Comment: CRITICAL RESULT CALLED TO, READ BACK BY AND VERIFIED WITH: LESTER,M RN 10/25/2015 2306 JORDANS   Glucose, capillary     Status: Abnormal   Collection Time: 10/25/15 10:24 PM  Result Value Ref Range   Glucose-Capillary 236 (H) 65 - 99 mg/dL  MRSA PCR Screening     Status: None   Collection Time: 10/25/15 11:51 PM  Result Value Ref Range   MRSA by PCR NEGATIVE NEGATIVE    Comment:        The GeneXpert MRSA Assay (FDA approved for NASAL specimens only), is one component of a comprehensive MRSA colonization surveillance program. It is not intended to diagnose MRSA infection nor to guide or monitor treatment for MRSA infections.   Magnesium     Status: None   Collection Time: 10/26/15  3:17 AM  Result Value Ref Range   Magnesium 2.4 1.7 - 2.4 mg/dL  Phosphorus     Status: Abnormal   Collection Time: 10/26/15  3:17 AM  Result Value Ref Range   Phosphorus 5.8 (H) 2.5 - 4.6 mg/dL  TSH     Status: None   Collection Time: 10/26/15  3:17 AM  Result Value Ref Range   TSH 1.917 0.350 - 4.500 uIU/mL  Comprehensive metabolic panel     Status:  Abnormal   Collection Time: 10/26/15  3:17 AM  Result Value Ref Range   Sodium 137 135 - 145 mmol/L   Potassium 4.0 3.5 - 5.1 mmol/L   Chloride 97 (L) 101 - 111 mmol/L   CO2 27 22 - 32 mmol/L   Glucose, Bld 217 (H) 65 - 99 mg/dL   BUN 56 (H) 6 - 20 mg/dL   Creatinine, Ser 9.97 (H) 0.61 - 1.24 mg/dL   Calcium 8.6 (L) 8.9 - 10.3 mg/dL   Total Protein 6.5 6.5 - 8.1 g/dL   Albumin 2.9 (L) 3.5 - 5.0 g/dL   AST 15 15 - 41 U/L   ALT 19 17 - 63 U/L   Alkaline Phosphatase 52 38 - 126 U/L   Total Bilirubin 0.8 0.3 - 1.2 mg/dL   GFR calc non Af Amer 4 (L) >60 mL/min   GFR calc  Af Amer 5 (L) >60 mL/min    Comment: (NOTE) The eGFR has been calculated using the CKD EPI equation. This calculation has not been validated in all clinical situations. eGFR's persistently <60 mL/min signify possible Chronic Kidney Disease.    Anion gap 13 5 - 15  CBC     Status: Abnormal   Collection Time: 10/26/15  3:17 AM  Result Value Ref Range   WBC 5.4 4.0 - 10.5 K/uL   RBC 3.50 (L) 4.22 - 5.81 MIL/uL   Hemoglobin 10.8 (L) 13.0 - 17.0 g/dL   HCT 33.2 (L) 39.0 - 52.0 %   MCV 94.9 78.0 - 100.0 fL   MCH 30.9 26.0 - 34.0 pg   MCHC 32.5 30.0 - 36.0 g/dL   RDW 15.9 (H) 11.5 - 15.5 %   Platelets 143 (L) 150 - 400 K/uL  Troponin I     Status: Abnormal   Collection Time: 10/26/15  3:17 AM  Result Value Ref Range   Troponin I 0.04 (HH) <0.03 ng/mL    Comment: CRITICAL VALUE NOTED.  VALUE IS CONSISTENT WITH PREVIOUSLY REPORTED AND CALLED VALUE.  Glucose, capillary     Status: Abnormal   Collection Time: 10/26/15  6:12 AM  Result Value Ref Range   Glucose-Capillary 224 (H) 65 - 99 mg/dL  Glucose, capillary     Status: Abnormal   Collection Time: 10/26/15 11:22 AM  Result Value Ref Range   Glucose-Capillary 259 (H) 65 - 99 mg/dL   Comment 1 Notify RN     Dg Chest Portable 1 View  Result Date: 10/25/2015 CLINICAL DATA:  Hypotension, history of atrial flutter and bradycardia. History of diabetes, end-stage  renal disease EXAM: PORTABLE CHEST 1 VIEW COMPARISON:  PA and lateral chest x-ray of October 04, 2015 FINDINGS: The lungs are mildly hypoinflated. The interstitial markings are mildly increased but are not as conspicuous as on the study of October 04, 2015. The cardiac silhouette remains mildly enlarged. The pulmonary vascularity is not clearly engorged. The mediastinum is normal in width. The bony thorax exhibits no acute abnormality. IMPRESSION: Improved appearance of the pulmonary interstitium since the previous study. Stable mild cardiomegaly without over pulmonary vascular congestion. No evidence of pneumonia. Electronically Signed   By: David  Martinique M.D.   On: 10/25/2015 15:17    ROS: All other systems reviewed and are negative  Blood pressure 103/75, pulse (!) 122, temperature 97.5 F (36.4 C), temperature source Oral, resp. rate 20, height _0  (1.956 m), weight 100.6 kg (221 lb 12.5 oz), SpO2 100 %. .  GEN: pleasant, NAD HEENT: blind, MMM, edentulous NECK: no JVD PULM: clear CV :  Tachy, irregular, soft systolic murmur ABD: obese, nontender, nondistended, NABS EXT: no edema, LUE AVF with aneuysmal areas with hypopigmented.  No frank ulcers but needlesticks from 9/25 readily apparent. NEURO: AAO x 3  Assessment/Plan: 1 Afib with RVR./ hypotension: hypotension resolved, intolerant of dilt, on BB. (please note that an earlier version of this note had noted this incorrectly). Restarted.  Per primary 2 ESRD: MWF.  Plan for HD tomorrow 9/27 3 Volume: EDW raised from 97--> 97.5 as an outpatient; may need to increase even more given recurrent episodes of Afib with RVR and hypotension 4. Anemia of ESRD: Mircera started on 9/13, not due until 10/13.  Most recent OP Hgb 11.6 on 9/20. On weekly Fe 5. Metabolic Bone Disease: PTH 240, Phos 6.5, corrected Ca 9.9.  On hectorol and Phoslo 2 tabs TID with meals in  addition to sensipar 60 mg daily.   6.  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   Does not appear to be at imminent risk of rupture but will c/s VVS for evaluation for possible need for intervention.     Madelon Lips 10/26/2015, 3:53 PM

## 2015-10-27 ENCOUNTER — Encounter: Payer: Self-pay | Admitting: Nurse Practitioner

## 2015-10-27 DIAGNOSIS — I959 Hypotension, unspecified: Secondary | ICD-10-CM | POA: Diagnosis not present

## 2015-10-27 DIAGNOSIS — Z992 Dependence on renal dialysis: Secondary | ICD-10-CM | POA: Diagnosis not present

## 2015-10-27 DIAGNOSIS — N186 End stage renal disease: Secondary | ICD-10-CM | POA: Diagnosis not present

## 2015-10-27 DIAGNOSIS — E1122 Type 2 diabetes mellitus with diabetic chronic kidney disease: Secondary | ICD-10-CM | POA: Diagnosis not present

## 2015-10-27 DIAGNOSIS — I5032 Chronic diastolic (congestive) heart failure: Secondary | ICD-10-CM | POA: Diagnosis not present

## 2015-10-27 DIAGNOSIS — I9589 Other hypotension: Secondary | ICD-10-CM | POA: Diagnosis not present

## 2015-10-27 LAB — BASIC METABOLIC PANEL
ANION GAP: 14 (ref 5–15)
BUN: 72 mg/dL — ABNORMAL HIGH (ref 6–20)
CALCIUM: 9.2 mg/dL (ref 8.9–10.3)
CO2: 24 mmol/L (ref 22–32)
CREATININE: 12.54 mg/dL — AB (ref 0.61–1.24)
Chloride: 99 mmol/L — ABNORMAL LOW (ref 101–111)
GFR, EST AFRICAN AMERICAN: 4 mL/min — AB (ref >60)
GFR, EST NON AFRICAN AMERICAN: 3 mL/min — AB (ref >60)
GLUCOSE: 136 mg/dL — AB (ref 65–99)
Potassium: 4.5 mmol/L (ref 3.5–5.1)
Sodium: 137 mmol/L (ref 135–145)

## 2015-10-27 LAB — HEMOGLOBIN A1C
Hgb A1c MFr Bld: 8.4 % — ABNORMAL HIGH (ref 4.8–5.6)
Mean Plasma Glucose: 194 mg/dL

## 2015-10-27 LAB — CBC
HCT: 33.4 % — ABNORMAL LOW (ref 39.0–52.0)
Hemoglobin: 10.6 g/dL — ABNORMAL LOW (ref 13.0–17.0)
MCH: 30.3 pg (ref 26.0–34.0)
MCHC: 31.7 g/dL (ref 30.0–36.0)
MCV: 95.4 fL (ref 78.0–100.0)
PLATELETS: 147 10*3/uL — AB (ref 150–400)
RBC: 3.5 MIL/uL — ABNORMAL LOW (ref 4.22–5.81)
RDW: 16 % — AB (ref 11.5–15.5)
WBC: 5.6 10*3/uL (ref 4.0–10.5)

## 2015-10-27 LAB — PROTIME-INR
INR: 2.9
Prothrombin Time: 30.9 seconds — ABNORMAL HIGH (ref 11.4–15.2)

## 2015-10-27 LAB — URINE CULTURE

## 2015-10-27 LAB — GLUCOSE, CAPILLARY
Glucose-Capillary: 168 mg/dL — ABNORMAL HIGH (ref 65–99)
Glucose-Capillary: 208 mg/dL — ABNORMAL HIGH (ref 65–99)

## 2015-10-27 LAB — CORTISOL: Cortisol, Plasma: 12.2 ug/dL

## 2015-10-27 MED ORDER — GUAIFENESIN 100 MG/5ML PO SOLN
15.0000 mL | ORAL | Status: DC | PRN
  Administered 2015-10-31 (×4): 300 mg via ORAL
  Filled 2015-10-27 (×4): qty 15

## 2015-10-27 MED ORDER — WARFARIN SODIUM 5 MG PO TABS
5.0000 mg | ORAL_TABLET | Freq: Once | ORAL | Status: AC
  Administered 2015-10-27: 5 mg via ORAL
  Filled 2015-10-27: qty 1

## 2015-10-27 MED ORDER — DOXERCALCIFEROL 4 MCG/2ML IV SOLN
INTRAVENOUS | Status: AC
  Administered 2015-10-27: 2 ug via INTRAVENOUS
  Filled 2015-10-27: qty 2

## 2015-10-27 MED ORDER — WARFARIN - PHARMACIST DOSING INPATIENT
Freq: Every day | Status: DC
  Administered 2015-10-30: 18:00:00

## 2015-10-27 NOTE — Progress Notes (Signed)
Patient arrived to unit per bed.  Reviewed treatment plan and this RN agrees.  Report received from bedside RN, Anisha.  Consent obtained.  Patient A & O X 4. Lung sounds diminished, clear to ausculation in all fields. Generalized edema. Cardiac: A fib, HR in 120's and 130's.  Prepped LUAVF with alcohol and cannulated with two 15 gauge needles.  Pulsation of blood noted.  Flushed access well with saline per protocol.  Connected and secured lines and initiated tx at 1501.  UF goal of 1500 mL and net fluid removal of 1000 mL.  Will continue to monitor.

## 2015-10-27 NOTE — Progress Notes (Signed)
Dialysis treatment terminated early per nephrology at bedside due to sustained HR of 155, hypotension.  658 mL ultrafiltrated and net fluid removal -42 mL.    Patient status unchanged. Lung sounds clear, diminished to ausculation in all fields. Generalized edema. Cardiac: Afib.  Disconnected lines and removed needles.  Pressure held for 10 minutes and band aid/gauze dressing applied.  Report given to bedside RN, Anisha.

## 2015-10-27 NOTE — Progress Notes (Signed)
Pt refused all his night time medicines and his blood sugar checks. I explained and stressed the importance of each med and especially blood sugar checks and pt stated that he did not want to take his medicines or get his sugar checks. Will continue to monitor pt.

## 2015-10-27 NOTE — Procedures (Signed)
Patient seen on Hemodialysis. QB 400 UF goal 1l.  Being gentle with UF as he has recurrent Afib with RVR. Treatment adjusted as needed.  [name] ButtnerElizabeth Nayan Proch MD [facility]. Cell [phone] Pager # [phone] 3:34 PM

## 2015-10-27 NOTE — Progress Notes (Signed)
PROGRESS NOTE  [name] [name] ZOX:[id number] DOB: 07/31/1933 DOA: 10/25/2015 PCP: Katy Apo, MD  HPI/Recap of past 24 hours: He was sent from HD to ED due to hypotension during dialysis He denies chest pain or dyspnea.  Awaiting HD for today   Assessment/Plan: Principal Problem:   Hypotension Active Problems:   Peripheral neuropathy (HCC)   Diabetes mellitus with ESRD (end-stage renal disease) (HCC)   Sinus bradycardia-tachycardia syndrome (HCC)   PAF (paroxysmal atrial fibrillation) (HCC)   Physical deconditioning   Congestive heart failure (HCC)   Essential hypertension   ESRD on dialysis (HCC)   Hypotension in the setting of hemodialysis. Possibly secondary to fluid shifts. Also recent nausea and vomiting. Blood pressure currently stabilized. No evidence for sepsis. Hold off on IV antibiotics for now. I have discussed with nephrology about possible adjusting dry weight target. Cortisol; at 12.  BP medications adjusted.   . Sinus bradycardia-tachycardia syndrome (HCC) stable continue home medications with holding parameters Physical deconditioning will need to discharge with PT OT follow-up.  Marland Kitchen PAF (paroxysmal atrial fibrillation) (HCC) [place] vasc 2 score of 5.  Given hypotension will only restart metoprolol with holding parameters and would attempt to restart Cardizem once blood pressure stabilized .  Coumadin per pharmacy to dose.   . Essential hypertension hold home medications given recent hypotension. . Diabetes mellitus with ESRD (end-stage renal disease) (HCC) with a sliding scale .End-stage renal disease on HD MWF, nephrology input appreciated  Recent frequent hospitalization, if does not improve, and no reversible causes, may consider palliative care consult.   DVT prophylaxis:  SCD    Code Status:    DNR/DNI as per patient   Family Communication:   Family not at  Bedside   Disposition Plan:                              Back to current facility  when stable                                                  Social Work   Nutrition                              Consultants:  neprhology  Procedures:  HD  Antibiotics:  none   Objective: BP 122/88   Pulse (!) 117   Temp 97.5 F (36.4 C) (Oral)   Resp (!) 22   Ht 6\' 5"  (1.956 m)   Wt 100.6 kg (221 lb 12.5 oz)   SpO2 100%   BMI 26.30 kg/m   Intake/Output Summary (Last 24 hours) at 10/27/15 1428 Last data filed at 10/27/15 1300  Gross per 24 hour  Intake              465 ml  Output                0 ml  Net              465 ml   Filed Weights   10/25/15 2140  Weight: 100.6 kg (221 lb 12.5 oz)    Exam:   General:  Chronically ill, NAD, left eye blind ( chronic)  Cardiovascular: IRRR  Respiratory: CTABL  Abdomen: Soft/ND/NT, positive BS  Musculoskeletal: No Edema  Neuro: aaox3  Data Reviewed: Basic Metabolic Panel:  Recent Labs Lab 10/25/15 1727 10/26/15 0317 10/26/15 1838 10/27/15 0350  NA 137 137 139 137  K 4.0 4.0 4.0 4.5  CL 97* 97* 96* 99*  CO2 23 27 27 24   GLUCOSE 164* 217* 173* 136*  BUN 49* 56* 65* 72*  CREATININE 9.16* 9.97* 11.83* 12.54*  CALCIUM 8.8* 8.6* 9.1 9.2  MG  --  2.4  --   --   PHOS  --  5.8* 6.1*  --    Liver Function Tests:  Recent Labs Lab 10/25/15 1727 10/26/15 0317 10/26/15 1838  AST 20 15  --   ALT 22 19  --   ALKPHOS 56 52  --   BILITOT 1.0 0.8  --   PROT 7.4 6.5  --   ALBUMIN 3.2* 2.9* 3.1*   No results for input(s): LIPASE, AMYLASE in the last 168 hours. No results for input(s): AMMONIA in the last 168 hours. CBC:  Recent Labs Lab 10/25/15 1727 10/26/15 0317 10/26/15 1838 10/27/15 0350  WBC 6.2 5.4 5.8 5.6  NEUTROABS 3.5  --   --   --   HGB 13.0 10.8* 11.2* 10.6*  HCT 39.6 33.2* 34.7* 33.4*  MCV 93.8 94.9 95.3 95.4  PLT 167 143* 143* 147*   Cardiac Enzymes:    Recent Labs Lab 10/25/15 2146 10/26/15 0317  TROPONINI 0.04* 0.04*   BNP (last 3 results)  Recent Labs   08/12/15 2143 09/22/15 0833 10/25/15 1727  BNP 276.9* 568.9* 580.8*    ProBNP (last 3 results) No results for input(s): PROBNP in the last 8760 hours.  CBG:  Recent Labs Lab 10/26/15 1122 10/26/15 1651 10/26/15 2121 10/27/15 0704 10/27/15 1135  GLUCAP 259* 162* 135* 168* 208*    Recent Results (from the past 240 hour(s))  Culture, blood (routine x 2)     Status: None (Preliminary result)   Collection Time: 10/25/15  5:27 PM  Result Value Ref Range Status   Specimen Description BLOOD RIGHT WRIST  Final   Special Requests BOTTLES DRAWN AEROBIC AND ANAEROBIC 5CC  Final   Culture NO GROWTH < 24 HOURS  Final   Report Status PENDING  Incomplete  Urine culture     Status: Abnormal   Collection Time: 10/25/15  9:05 PM  Result Value Ref Range Status   Specimen Description URINE, RANDOM  Final   Special Requests NONE  Final   Culture MULTIPLE SPECIES PRESENT, SUGGEST RECOLLECTION (A)  Final   Report Status 10/27/2015 FINAL  Final  MRSA PCR Screening     Status: None   Collection Time: 10/25/15 11:51 PM  Result Value Ref Range Status   MRSA by PCR NEGATIVE NEGATIVE Final    Comment:        The GeneXpert MRSA Assay (FDA approved for NASAL specimens only), is one component of a comprehensive MRSA colonization surveillance program. It is not intended to diagnose MRSA infection nor to guide or monitor treatment for MRSA infections.      Studies: No results found.  Scheduled Meds: . atorvastatin  10 mg Oral q1800  . calcium acetate  1,334 mg Oral TID WC  . cinacalcet  60 mg Oral Q supper  . docusate sodium  100 mg Oral BID  . dorzolamide  1 drop Right Eye BID   And  . timolol  1 drop Right Eye BID  . doxercalciferol  2 mcg Intravenous Q M,W,F-HD  . feeding supplement (NEPRO CARB STEADY)  237 mL Oral BID BM  . ferric gluconate (FERRLECIT/NULECIT) IV  62.5 mg Intravenous Weekly  . insulin aspart  0-5 Units Subcutaneous QHS  . insulin aspart  0-9 Units Subcutaneous  TID WC  . latanoprost  1 drop Right Eye QHS  . metoprolol  25 mg Oral Q6H  . prednisoLONE acetate  1 drop Left Eye BID  . sodium chloride  250 mL Intravenous Once  . sodium chloride flush  3 mL Intravenous Q12H  . sodium chloride flush  3 mL Intravenous Q12H  . tobramycin-dexamethasone  1 drop Left Eye QID    Continuous Infusions:    Time spent: 35mins  [name] [name], Mal Asher A MD  [facility] Pager [phone] If 7PM-7AM, please contact night-coverage at [email or web address], password [email or web address] 10/27/2015, 2:28 PM  LOS: 0 days

## 2015-10-27 NOTE — Progress Notes (Signed)
ANTICOAGULATION CONSULT NOTE - Follow Up Consult  Pharmacy Consult for warfarin Indication: atrial fibrillation  Allergies  Allergen Reactions  . Diltiazem Other (See Comments)    SEVERE STOMACH CRAMPING - Takes extended release form without problems    Patient Measurements: Height: 6\' 5"  (195.6 cm) Weight: 221 lb 12.5 oz (100.6 kg) IBW/kg (Calculated) : 89.1  Vital Signs: Temp: 97.5 F (36.4 C) (09/27 0842) Temp Source: Oral (09/27 0842) BP: 122/88 (09/27 1405) Pulse Rate: 117 (09/27 1405)  Labs:  Recent Labs  10/25/15 1727 10/25/15 2146 10/26/15 0317 10/26/15 1838 10/27/15 0350  HGB 13.0  --  10.8* 11.2* 10.6*  HCT 39.6  --  33.2* 34.7* 33.4*  PLT 167  --  143* 143* 147*  LABPROT 40.3*  --   --   --  30.9*  INR 4.04*  --   --   --  2.90  CREATININE 9.16*  --  9.97* 11.83* 12.54*  TROPONINI  --  0.04* 0.04*  --   --    Estimated Creatinine Clearance: 5.9 mL/min (by C-G formula based on SCr of 12.54 mg/dL (H)).  Medical History: Past Medical History:  Diagnosis Date  . Anemia   . Atrial flutter (HCC)   . Bradycardia    a. unable to tolerate beta blockers in the past.  . Diabetes mellitus (HCC)   . ESRD (end stage renal disease) on dialysis [facility](HCC)    "MWF; [address]" (04/08/2015)  . Glaucoma   . Gout   . Hypertension   . PAF (paroxysmal atrial fibrillation) (HCC)   . Peripheral neuropathy (HCC)   . PSVT (paroxysmal supraventricular tachycardia) (HCC)    a. h/o ablation.    Medications:  Prescriptions Prior to Admission  Medication Sig Dispense Refill Last Dose  . acetaminophen (TYLENOL) 325 MG tablet Take 650 mg by mouth daily as needed for mild pain.   unknown  . atorvastatin (LIPITOR) 10 MG tablet Take 1 tablet (10 mg total) by mouth daily at 6 PM. 30 tablet 6 10/24/2015 at Unknown time  . B Complex-C-Folic Acid (RENA-VITE PO) Take 1 tablet by mouth daily.   10/25/2015 at Unknown time  . Calcium Acetate 667 MG TABS Take 667-1,334 mg by mouth  See admin instructions. 667 mg for snacks, 1334 mg for meals   10/25/2015 at Unknown time  . diltiazem (CARDIZEM CD) 360 MG 24 hr capsule Take 1 capsule (360 mg total) by mouth daily. 30 capsule 6 10/25/2015 at Unknown time  . docusate sodium (COLACE) 100 MG capsule Take 100 mg by mouth 2 (two) times daily.    10/25/2015 at Unknown time  . dorzolamide-timolol (COSOPT) 22.3-6.8 MG/ML ophthalmic solution Place 1 drop into the right eye 2 (two) times daily.    10/25/2015 at Unknown time  . lidocaine-prilocaine (EMLA) cream Apply 1 application topically as needed (apply to skin near dialysis port before dialysis).    10/25/2015 at Unknown time  . metoprolol (LOPRESSOR) 50 MG tablet Take 1 tablet (50 mg total) by mouth 2 (two) times daily. 60 tablet 6 10/25/2015 at 0800  . neomycin-polymyxin b-dexamethasone (MAXITROL) 3.5-10000-0.1 SUSP Place 1 drop into the left eye 4 (four) times daily.   10/25/2015 at Unknown time  . prednisoLONE acetate (PRED FORTE) 1 % ophthalmic suspension Place 1 drop into the left eye 2 (two) times daily. Patient to use for 37 days. Filled on 03-28-15   10/25/2015 at Unknown time  . SENSIPAR 60 MG tablet Take 60 mg by mouth  at bedtime.    10/24/2015 at Unknown time  . tobramycin-dexamethasone (TOBRADEX) ophthalmic solution Place 1 drop into the left eye 4 (four) times daily. For 37 days. Filled 03-31-15   10/25/2015 at Unknown time  . TRAVATAN Z 0.004 % SOLN ophthalmic solution Place 1 drop into the right eye at bedtime.    10/24/2015 at Unknown time  . warfarin (COUMADIN) 5 MG tablet Take as directed by [facility] (Patient taking differently: Take 6 mg by mouth See admin instructions. 7.5 mg daily) 50 tablet 3 10/24/2015 at Unknown time   Assessment: 82 y.o.maleadmittedon 9/25/2017with hypotension. Initially treated empirically for sepsis, now thought to be caused by dehydration. Pharmacy has been consulted for warfarin dosing. Patient's INR 4.04 on admission and warfarin held> INR 2.9  today will restart at lower dose. CBC ok   Goal of Therapy:  INR 2-3 Monitor platelets by anticoagulation protocol: Yes   Plan:  Warfarin 5mg  x1 tonight Daily INr  Leota Sauers Pharm.D. CPP, BCPS Clinical Pharmacist [phone] 10/27/2015 2:42 PM

## 2015-10-27 NOTE — Progress Notes (Signed)
[facility] Progress Note   Assessment/ Plan:   1 Afib with RVR./ hypotension: hypotension resolved, intolerant of dilt in the past, on BB (metop 25 q 6)  Restarted.  Per primary.  Metop given before HD today in effort to control HR .   2 ESRD: MWF.  Plan for HD 9/27 3 Volume: EDW raised from 97--> 97.5 as an outpatient; may need to increase even more given recurrent episodes of Afib with RVR and hypotension.  UF 1L today 9/27. 4. Anemia of ESRD: Mircera started on 9/13, not due until 10/13.  Most recent OP Hgb 11.6 on 9/20. On weekly Fe 5. Metabolic Bone Disease: PTH 240, Phos 6.5, corrected Ca 9.9.  On hectorol and Phoslo 2 tabs TID with meals in addition to sensipar 60 mg daily.   6.  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  Does not appear to be at imminent risk of rupture, VVS consulted and pt does not desire surgery for this ever and he reiterated this to me today.    Subjective:    Feeling well.  Having episodes of RVR     Objective:   BP 114/80   Pulse (!) 114   Temp 98 F (36.7 C)   Resp 18   Ht 6\' 5"  (1.956 m)   Wt 100.6 kg (221 lb 12.5 oz)   SpO2 100%   BMI 26.30 kg/m   Physical Exam: GEN: pleasant, NAD HEENT: blind, MMM, edentulous NECK: no JVD PULM: clear CV :  Tachy, irregular, soft systolic murmur ABD: obese, nontender, nondistended, NABS EXT: no edema, LUE AVF with aneuysmal areas with hypopigmented.  No frank ulcers. NEURO: AAO x 3  Labs: [employer]  Recent Labs Lab 10/25/15 1727 10/26/15 0317 10/26/15 1838 10/27/15 0350  NA 137 137 139 137  K 4.0 4.0 4.0 4.5  CL 97* 97* 96* 99*  CO2 23 27 27 24   GLUCOSE 164* 217* 173* 136*  BUN 49* 56* 65* 72*  CREATININE 9.16* 9.97* 11.83* 12.54*  CALCIUM 8.8* 8.6* 9.1 9.2  PHOS  --  5.8* 6.1*  --    CBC  Recent Labs Lab 10/25/15 1727 10/26/15 0317 10/26/15 1838 10/27/15 0350  WBC 6.2 5.4 5.8 5.6  NEUTROABS 3.5  --   --   --   HGB 13.0 10.8* 11.2* 10.6*  HCT 39.6 33.2* 34.7* 33.4*   MCV 93.8 94.9 95.3 95.4  PLT 167 143* 143* 147*    @IMGRELPRIORS @ Medications:    . atorvastatin  10 mg Oral q1800  . calcium acetate  1,334 mg Oral TID WC  . cinacalcet  60 mg Oral Q supper  . docusate sodium  100 mg Oral BID  . dorzolamide  1 drop Right Eye BID   And  . timolol  1 drop Right Eye BID  . doxercalciferol  2 mcg Intravenous Q M,W,F-HD  . feeding supplement (NEPRO CARB STEADY)  237 mL Oral BID BM  . ferric gluconate (FERRLECIT/NULECIT) IV  62.5 mg Intravenous Weekly  . insulin aspart  0-5 Units Subcutaneous QHS  . insulin aspart  0-9 Units Subcutaneous TID WC  . latanoprost  1 drop Right Eye QHS  . metoprolol  25 mg Oral Q6H  . prednisoLONE acetate  1 drop Left Eye BID  . sodium chloride  250 mL Intravenous Once  . sodium chloride flush  3 mL Intravenous Q12H  . sodium chloride flush  3 mL Intravenous Q12H  . tobramycin-dexamethasone  1 drop Left  Eye QID  . warfarin  5 mg Oral ONCE-1800  . Warfarin - Pharmacist Dosing Inpatient   Does not apply q1800     Bufford ButtnerElizabeth Damin Salido, MD [facility] Cell [phone] [phone] 10/27/2015, 3:28 PM

## 2015-10-27 NOTE — Progress Notes (Signed)
Pt is refusing any medications for his cough or heart rate. I explained the need and importance to control his heart rate and the cough because it's causing his HR to increase. He still refuses.

## 2015-10-28 DIAGNOSIS — I482 Chronic atrial fibrillation: Secondary | ICD-10-CM | POA: Diagnosis present

## 2015-10-28 DIAGNOSIS — E860 Dehydration: Secondary | ICD-10-CM | POA: Diagnosis present

## 2015-10-28 DIAGNOSIS — N186 End stage renal disease: Secondary | ICD-10-CM | POA: Diagnosis present

## 2015-10-28 DIAGNOSIS — I10 Essential (primary) hypertension: Secondary | ICD-10-CM

## 2015-10-28 DIAGNOSIS — Z66 Do not resuscitate: Secondary | ICD-10-CM | POA: Diagnosis present

## 2015-10-28 DIAGNOSIS — Z993 Dependence on wheelchair: Secondary | ICD-10-CM | POA: Diagnosis not present

## 2015-10-28 DIAGNOSIS — E8889 Other specified metabolic disorders: Secondary | ICD-10-CM | POA: Diagnosis present

## 2015-10-28 DIAGNOSIS — I480 Paroxysmal atrial fibrillation: Secondary | ICD-10-CM | POA: Diagnosis present

## 2015-10-28 DIAGNOSIS — I953 Hypotension of hemodialysis: Secondary | ICD-10-CM | POA: Diagnosis present

## 2015-10-28 DIAGNOSIS — E1122 Type 2 diabetes mellitus with diabetic chronic kidney disease: Secondary | ICD-10-CM | POA: Diagnosis present

## 2015-10-28 DIAGNOSIS — I5032 Chronic diastolic (congestive) heart failure: Secondary | ICD-10-CM

## 2015-10-28 DIAGNOSIS — T82898A Other specified complication of vascular prosthetic devices, implants and grafts, initial encounter: Secondary | ICD-10-CM | POA: Diagnosis present

## 2015-10-28 DIAGNOSIS — I132 Hypertensive heart and chronic kidney disease with heart failure and with stage 5 chronic kidney disease, or end stage renal disease: Secondary | ICD-10-CM | POA: Diagnosis present

## 2015-10-28 DIAGNOSIS — Z992 Dependence on renal dialysis: Secondary | ICD-10-CM | POA: Diagnosis not present

## 2015-10-28 DIAGNOSIS — Z7901 Long term (current) use of anticoagulants: Secondary | ICD-10-CM | POA: Diagnosis not present

## 2015-10-28 DIAGNOSIS — I9589 Other hypotension: Secondary | ICD-10-CM

## 2015-10-28 DIAGNOSIS — I959 Hypotension, unspecified: Secondary | ICD-10-CM | POA: Diagnosis present

## 2015-10-28 DIAGNOSIS — H409 Unspecified glaucoma: Secondary | ICD-10-CM | POA: Diagnosis present

## 2015-10-28 DIAGNOSIS — I495 Sick sinus syndrome: Secondary | ICD-10-CM | POA: Diagnosis present

## 2015-10-28 DIAGNOSIS — I471 Supraventricular tachycardia: Secondary | ICD-10-CM | POA: Diagnosis present

## 2015-10-28 DIAGNOSIS — I484 Atypical atrial flutter: Secondary | ICD-10-CM | POA: Diagnosis present

## 2015-10-28 DIAGNOSIS — H5462 Unqualified visual loss, left eye, normal vision right eye: Secondary | ICD-10-CM | POA: Diagnosis present

## 2015-10-28 DIAGNOSIS — D631 Anemia in chronic kidney disease: Secondary | ICD-10-CM | POA: Diagnosis present

## 2015-10-28 DIAGNOSIS — E1142 Type 2 diabetes mellitus with diabetic polyneuropathy: Secondary | ICD-10-CM | POA: Diagnosis present

## 2015-10-28 DIAGNOSIS — Z888 Allergy status to other drugs, medicaments and biological substances status: Secondary | ICD-10-CM | POA: Diagnosis not present

## 2015-10-28 DIAGNOSIS — Z79899 Other long term (current) drug therapy: Secondary | ICD-10-CM | POA: Diagnosis not present

## 2015-10-28 DIAGNOSIS — E871 Hypo-osmolality and hyponatremia: Secondary | ICD-10-CM | POA: Diagnosis not present

## 2015-10-28 LAB — GLUCOSE, CAPILLARY
GLUCOSE-CAPILLARY: 134 mg/dL — AB (ref 65–99)
GLUCOSE-CAPILLARY: 215 mg/dL — AB (ref 65–99)
GLUCOSE-CAPILLARY: 143 mg/dL — AB (ref 65–99)
Glucose-Capillary: 213 mg/dL — ABNORMAL HIGH (ref 65–99)

## 2015-10-28 LAB — PROTIME-INR
INR: 1.99
Prothrombin Time: 22.9 seconds — ABNORMAL HIGH (ref 11.4–15.2)

## 2015-10-28 MED ORDER — LIDOCAINE HCL (PF) 1 % IJ SOLN: 5.0000 mL | INTRAMUSCULAR | Status: DC | PRN

## 2015-10-28 MED ORDER — HEPARIN SODIUM (PORCINE) 1000 UNIT/ML DIALYSIS: 1000.0000 [IU] | INTRAMUSCULAR | Status: DC | PRN

## 2015-10-28 MED ORDER — LIDOCAINE-PRILOCAINE 2.5-2.5 % EX CREA: 1.0000 "application " | TOPICAL_CREAM | CUTANEOUS | Status: DC | PRN

## 2015-10-28 MED ORDER — SODIUM CHLORIDE 0.9 % IV SOLN: 100.0000 mL | INTRAVENOUS | Status: DC | PRN

## 2015-10-28 MED ORDER — PENTAFLUOROPROP-TETRAFLUOROETH EX AERO: 1.0000 "application " | INHALATION_SPRAY | CUTANEOUS | Status: DC | PRN

## 2015-10-28 MED ORDER — DILTIAZEM HCL ER COATED BEADS 180 MG PO CP24
360.0000 mg | ORAL_CAPSULE | Freq: Every day | ORAL | Status: DC
Start: 2015-10-28 — End: 2015-11-01
  Administered 2015-10-28 – 2015-11-01 (×5): 360 mg via ORAL
  Filled 2015-10-28 (×6): qty 2

## 2015-10-28 MED ORDER — ALTEPLASE 2 MG IJ SOLR: 2.0000 mg | Freq: Once | INTRAMUSCULAR | Status: DC | PRN

## 2015-10-28 MED ORDER — WARFARIN SODIUM 7.5 MG PO TABS
7.5000 mg | ORAL_TABLET | Freq: Once | ORAL | Status: AC
  Administered 2015-10-28: 7.5 mg via ORAL
  Filled 2015-10-28: qty 1

## 2015-10-28 NOTE — Progress Notes (Signed)
[facility] Progress Note   Assessment/ Plan:   1 Afib with RVR./ hypotension: dialysis had to be terminated yesterday 9/27 after 2 hrs.  On Metop which is dialyzed off during treatment; also has been happening as an outpatient.  ? If the cardizem will help as well, if not will need to consider amio if he wants to continue dialysis.   2 ESRD: MWF.  Plan for HD 9/29 3 Volume: EDW raised from 97--> 97.5 as an outpatient; may need to increase even more given recurrent episodes of Afib with RVR and hypotension but given what I saw in HD yesterday don't think it's necessarily a volume issue as UF goal only 1 L . 4. Anemia of ESRD: Mircera started on 9/13, not due until 10/13.  Most recent OP Hgb 11.6 on 9/20. On weekly Fe 5. Metabolic Bone Disease: PTH 240, Phos 6.5, corrected Ca 9.9.  On hectorol and Phoslo 2 tabs TID with meals in addition to sensipar 60 mg daily.   6.  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  Does not appear to be at imminent risk of rupture, VVS consulted and pt does not desire surgery for this ever and he reiterated this to me as well.  Subjective:    HD had to be terminated yesterday after 2 hours due to Afib with RVR to the 150s and hypotension.  +42 mL.  This AM feeling well.   Objective:   BP 138/88   Pulse (!) 131   Temp 97.8 F (36.6 C) (Oral)   Resp 18   Ht 6\' 5"  (1.956 m)   Wt 102.7 kg (226 lb 6.6 oz)   SpO2 100%   BMI 26.85 kg/m   Physical Exam: GEN:NAD HEENT: blind, MMM, edentulous NECK: no JVD PULM: some coarse breath sounds throughout CV :  Tachy, irregular, soft systolic murmur ABD: obese, nontender, nondistended, NABS EXT: no edema, LUE AVF with aneuysmal areas with hypopigmented.  No frank ulcers. NEURO: AAO x 3  Labs: [employer]  Recent Labs Lab 10/25/15 1727 10/26/15 0317 10/26/15 1838 10/27/15 0350  NA 137 137 139 137  K 4.0 4.0 4.0 4.5  CL 97* 97* 96* 99*  CO2 23 27 27 24   GLUCOSE 164* 217* 173* 136*  BUN 49* 56*  65* 72*  CREATININE 9.16* 9.97* 11.83* 12.54*  CALCIUM 8.8* 8.6* 9.1 9.2  PHOS  --  5.8* 6.1*  --    CBC  Recent Labs Lab 10/25/15 1727 10/26/15 0317 10/26/15 1838 10/27/15 0350  WBC 6.2 5.4 5.8 5.6  NEUTROABS 3.5  --   --   --   HGB 13.0 10.8* 11.2* 10.6*  HCT 39.6 33.2* 34.7* 33.4*  MCV 93.8 94.9 95.3 95.4  PLT 167 143* 143* 147*    @IMGRELPRIORS @ Medications:    . atorvastatin  10 mg Oral q1800  . calcium acetate  1,334 mg Oral TID WC  . cinacalcet  60 mg Oral Q supper  . diltiazem  360 mg Oral Daily  . docusate sodium  100 mg Oral BID  . dorzolamide  1 drop Right Eye BID   And  . timolol  1 drop Right Eye BID  . doxercalciferol  2 mcg Intravenous Q M,W,F-HD  . feeding supplement (NEPRO CARB STEADY)  237 mL Oral BID BM  . ferric gluconate (FERRLECIT/NULECIT) IV  62.5 mg Intravenous Weekly  . insulin aspart  0-5 Units Subcutaneous QHS  . insulin aspart  0-9 Units Subcutaneous TID WC  .  latanoprost  1 drop Right Eye QHS  . metoprolol  25 mg Oral Q6H  . prednisoLONE acetate  1 drop Left Eye BID  . sodium chloride  250 mL Intravenous Once  . sodium chloride flush  3 mL Intravenous Q12H  . sodium chloride flush  3 mL Intravenous Q12H  . tobramycin-dexamethasone  1 drop Left Eye QID  . Warfarin - Pharmacist Dosing Inpatient   Does not apply q1800     Bufford Buttner, MD [facility] Cell [phone] pgr [phone] 10/28/2015, 10:17 AM

## 2015-10-28 NOTE — Consult Note (Signed)
Cardiology Consult    Patient ID: [name] [name] MRN: [id number], DOB/AGE: 10/01/1933   Admit date: 10/25/2015 Date of Consult: 10/28/2015  Primary Physician: Katy Apo, MD Reason for Consult: afib Primary Cardiologist: Dr. Katrinka Blazing Requesting Provider: Dr. Sunnie Nielsen   Patient Profile  Mr. [name] [name] is a 82 year old male with a past medical history of atrial flutter, DM, ESRD on HD, HTN, PAF (on Coumadin), PSVT with ablation. He presented to the ED on 10/25/15 with hypotension while at HD.   History of Present Illness  Mr. [name] [name] was at the dialysis center on Monday 10/25/15 and became tachycardic and hypotensive. He only completed half of his dialysis treatment and felt very weak so he was sent to the ED.   EKG on arrival to ED shows atrial fibrillation with rates in the 130's. He normally takes diltiazem at home, this was discontinued on admission for hypotension.  He has previously taken amiodarone but this was stopped in July of 2017 after EP was consulted and it was felt that it was better to rate control him with beta blockers rather than use rhythm control.  Review of his chart shows that he has had multiple admissions for Afib with RVR and atypical atrial flutter.   Cardiology was consulted to help manage his medications.     Past Medical History   Past Medical History:  Diagnosis Date  . Anemia   . Atrial flutter (HCC)   . Bradycardia    a. unable to tolerate beta blockers in the past.  . Diabetes mellitus (HCC)   . ESRD (end stage renal disease) on dialysis [facility])    "MWF; [address]" (04/08/2015)  . Glaucoma   . Gout   . Hypertension   . PAF (paroxysmal atrial fibrillation) (HCC)   . Peripheral neuropathy (HCC)   . PSVT (paroxysmal supraventricular tachycardia) (HCC)    a. h/o ablation.    Past Surgical History:  Procedure Laterality Date  . ABLATION OF DYSRHYTHMIC FOCUS    . SP AV DIALYSIS SHUNT ACCESS EXISTING *L*       Allergies  Allergies    Allergen Reactions  . Diltiazem Other (See Comments)    SEVERE STOMACH CRAMPING - Takes extended release form without problems    Inpatient Medications    . atorvastatin  10 mg Oral q1800  . calcium acetate  1,334 mg Oral TID WC  . cinacalcet  60 mg Oral Q supper  . diltiazem  360 mg Oral Daily  . docusate sodium  100 mg Oral BID  . dorzolamide  1 drop Right Eye BID   And  . timolol  1 drop Right Eye BID  . doxercalciferol  2 mcg Intravenous Q M,W,F-HD  . feeding supplement (NEPRO CARB STEADY)  237 mL Oral BID BM  . ferric gluconate (FERRLECIT/NULECIT) IV  62.5 mg Intravenous Weekly  . insulin aspart  0-5 Units Subcutaneous QHS  . insulin aspart  0-9 Units Subcutaneous TID WC  . latanoprost  1 drop Right Eye QHS  . metoprolol  25 mg Oral Q6H  . prednisoLONE acetate  1 drop Left Eye BID  . sodium chloride  250 mL Intravenous Once  . sodium chloride flush  3 mL Intravenous Q12H  . sodium chloride flush  3 mL Intravenous Q12H  . tobramycin-dexamethasone  1 drop Left Eye QID  . warfarin  7.5 mg Oral ONCE-1800  . Warfarin - Pharmacist Dosing Inpatient   Does not apply [phone]  Family History    Family History  Problem Relation Age of Onset  . Other Mother     bowel obstruction  . Heart failure Father     fluid bluid up  . Alcoholism Brother   . Alcoholism Brother     Social History    Social History   Social History  . Marital status: Married    Spouse name: N/A  . Number of children: N/A  . Years of education: N/A   Occupational History  . Not on file.   Social History Main Topics  . Smoking status: Never Smoker  . Smokeless tobacco: Never Used  . Alcohol use No  . Drug use: No  . Sexual activity: Not on file   Other Topics Concern  . Not on file   Social History Narrative  . No narrative on file     Review of Systems    General:  No chills, fever, night sweats or weight changes.  Cardiovascular:  No chest pain, dyspnea on exertion, edema,  orthopnea, palpitations, paroxysmal nocturnal dyspnea. Dermatological: No rash, lesions/masses Respiratory: No cough, dyspnea Urologic: No hematuria, dysuria Abdominal:   No nausea, vomiting, diarrhea, bright red blood per rectum, melena, or hematemesis Neurologic:  No visual changes, wkns, changes in mental status. All other systems reviewed and are otherwise negative except as noted above.  Physical Exam    Blood pressure 122/75, pulse 62, temperature 98.2 F (36.8 C), temperature source Oral, resp. rate 18, height 6\' 5"  (1.956 m), weight 226 lb 6.6 oz (102.7 kg), SpO2 100 %.  General: Pleasant, NAD Psych: Normal affect. Neuro: Alert and oriented X 3. Moves all extremities spontaneously. HEENT: Normal  Neck: Supple without bruits or JVD. Lungs:  Resp regular and unlabored, CTA. Heart: RRR no s3, s4, or murmurs. Abdomen: Soft, non-tender, non-distended, BS + x 4.  Extremities: No clubbing, cyanosis or edema. DP/PT/Radials 2+ and equal bilaterally.  Labs    Troponin [facility] Test)  Recent Labs  10/25/15 1748  TROPIPOC 0.05    Recent Labs  10/25/15 2146 10/26/15 0317  TROPONINI 0.04* 0.04*   Lab Results  Component Value Date   WBC 5.6 10/27/2015   HGB 10.6 (L) 10/27/2015   HCT 33.4 (L) 10/27/2015   MCV 95.4 10/27/2015   PLT 147 (L) 10/27/2015    Recent Labs Lab 10/26/15 0317  10/27/15 0350  NA 137  < > 137  K 4.0  < > 4.5  CL 97*  < > 99*  CO2 27  < > 24  BUN 56*  < > 72*  CREATININE 9.97*  < > 12.54*  CALCIUM 8.6*  < > 9.2  PROT 6.5  --   --   BILITOT 0.8  --   --   ALKPHOS 52  --   --   ALT 19  --   --   AST 15  --   --   GLUCOSE 217*  < > 136*  < > = values in this interval not displayed. No results found for: CHOL, HDL, LDLCALC, TRIG No results found for: [facility]   Radiology Studies    Dg Chest 2 View  Result Date: 10/04/2015 CLINICAL DATA:  Cough and generalized weakness. EXAM: CHEST  2 VIEW COMPARISON:  09/23/2015 and prior radiographs  FINDINGS: Cardiomegaly again identified. Mild pulmonary vascular congestion noted. Very mild interstitial opacities bilaterally may represent minimal interstitial edema. Trace bilateral pleural effusions are noted. Mild left basilar atelectasis identified. There is no  evidence of pneumothorax. IMPRESSION: Cardiomegaly with pulmonary vascular congestion and possible minimal interstitial pulmonary edema. Trace bilateral pleural effusions. Mild bibasilar atelectasis. Electronically Signed   By: Harmon Pier M.D.   On: 10/04/2015 09:22   Mr Brain Wo Contrast  Result Date: 10/07/2015 CLINICAL DATA:  Bilateral leg weakness. EXAM: MRI HEAD WITHOUT CONTRAST TECHNIQUE: Multiplanar, multiecho pulse sequences of the brain and surrounding structures were obtained without intravenous contrast. COMPARISON:  CT head 04/20/2015.  MRI cervical spine 10/05/2015 FINDINGS: Brain: Moderate atrophy. Ventricular enlargement consistent with atrophy. Negative for acute infarct. Chronic ischemic changes in the white matter and pons. No cortical infarct. Chronic micro hemorrhage left parietal lobe. No other areas of hemorrhage or fluid collection. Negative for mass or edema. No shift of the midline structures. Vascular: Normal flow voids. Skull and upper cervical spine: Disc protrusion and spinal stenosis at C3-4. See cervical spine MRI report from 10/05/2015 Sinuses/Orbits: Paranasal sinuses clear. Chronic severe injury to the left globe with calcification. Right globe intact. Other: Normal surrounding soft tissues IMPRESSION: Atrophy and chronic ischemic change.  No acute infarct. Disc protrusion C3-4 with spinal stenosis. Electronically Signed   By: Marlan Palau M.D.   On: 10/07/2015 12:32   Mr Cervical Spine Wo Contrast  Result Date: 10/05/2015 CLINICAL DATA:  Inability to walk.  General weakness. EXAM: MRI CERVICAL SPINE WITHOUT CONTRAST TECHNIQUE: Multiplanar, multisequence MR imaging of the cervical spine was performed. No  intravenous contrast was administered. COMPARISON:  Cervical spine CT 04/04/2015 FINDINGS: Despite efforts by the technologist and patient, motion artifact is present on today's examination and could not be eliminated. This reduces the sensitivity and specificity of the study. Alignment: Reversal of the normal cervical lordosis. Grade 1 anterolisthesis at C3-C4. Vertebrae: Degenerative endplate signal changes at C4-C7. No evidence of acute compression fracture. No facet edema. Cord: Faint hyperintense T2 weighted signal within the spinal cord at the C3-4 level. Normal cord caliber. No syrinx. Posterior Fossa, vertebral arteries, paraspinal tissues: Visualized posterior fossa is normal. Vertebral artery flow voids are preserved. Normal visualized paraspinal soft tissues. Disc levels: C1-C2: Advanced degenerative change. C2-C3: Small left central disc protrusion. No spinal canal stenosis. No neuroforaminal stenosis. C3-C4: There is a left central disc extrusion with mild superior migration that effaces the ventral thecal sac and mildly flattens the left ventral aspect of the spinal cord. Minimal T2 hyperintensity is seen within the cord at this level. Mild central spinal canal stenosis. Bilateral uncovertebral hypertrophy, worse on the left and left-greater-than-right facet hypertrophy, causing mild right and moderate left neuroforaminal stenosis. C4-C5: Severe disc space loss. In the anterior osteophyte formation and uncovertebral hypertrophy. Mild bilateral facet hypertrophy. Small disc bulge. No spinal canal stenosis. Moderate right and mild left neuroforaminal stenosis. C5-C6: Severe disc space loss with small disc bulge. Anterior osteophyte formation, bilateral uncovertebral hypertrophy and mild bilateral facet hypertrophy. No spinal canal stenosis. Mild bilateral neuroforaminal stenosis. C6-C7: Severe disc space loss with osteophyte formation and bilateral uncovertebral hypertrophy. Small disc bulge. No spinal  canal stenosis. Mild bilateral neuroforaminal stenosis. C7-T1: Normal disc space and facet joints. No spinal canal stenosis. No neuroforaminal stenosis. IMPRESSION: 1. Left central disc extrusion with mild superior migration at C3-4 flattening the left ventral aspect of the spinal cord with associated faint T2 hyperintensity, suggesting a degree of myelomalacia. 2. Two severe degenerative disc disease with advanced height loss from C4-C7 without other spinal canal stenosis. 3. Multilevel mild-to-moderate neural foraminal narrowing. Electronically Signed   By: Deatra Robinson M.D.   On:  10/05/2015 23:29   Mr Lumbar Spine Wo Contrast  Result Date: 10/05/2015 CLINICAL DATA:  Difficulty walking.  Generalized weakness. EXAM: MRI LUMBAR SPINE WITHOUT CONTRAST TECHNIQUE: Multiplanar, multisequence MR imaging of the lumbar spine was performed. No intravenous contrast was administered. COMPARISON:  None. FINDINGS: Segmentation:  Normal Alignment:  Normal Vertebrae: There are degenerative endplate signal changes at L4-L5 and L5-S1. No other focal marrow lesion. No facet edema. Conus medullaris: Extends to the L2 level and appears normal. Paraspinal and other soft tissues: Severe bilateral renal atrophy with multiple T2 hyperintense lesions are likely cysts. Other visualized retroperitoneal soft tissues are normal. Disc levels: T12-L1: Normal disc space and facet joints. No spinal canal stenosis. No neuroforaminal stenosis. L1-L2: Normal disc space and facet joints. No spinal canal stenosis. No neuroforaminal stenosis. L2-L3: Normal disc space and facet joints. No spinal canal stenosis. No neuroforaminal stenosis. L3-L4: Disc desiccation with small bulge and severe bilateral facet hypertrophy with fluid in the left facet joint. No spinal canal stenosis. Moderate left neuroforaminal stenosis. L4-L5: Disc desiccation with small bulge and severe bilateral facet hypertrophy. Fluid within the left facet joint. Bilateral lateral  recess narrowing without central spinal canal stenosis. Moderate right and mild left neuroforaminal stenosis. L5-S1: Disc desiccation with small central disc protrusion. Severe right and mild left facet hypertrophy. No spinal canal stenosis. Severe right and moderate to severe left neural foraminal stenosis neuroforaminal stenosis. IMPRESSION: 1. Moderate to severe neural foraminal narrowing at L3-S1, worst at right L5-S1. 2. Multilevel severe facet arthrosis with fluid in the facet joints that may indicate a degree of segmental instability. 3. No spinal canal stenosis. Electronically Signed   By: Deatra Robinson M.D.   On: 10/05/2015 23:35   Dg Chest Portable 1 View  Result Date: 10/25/2015 CLINICAL DATA:  Hypotension, history of atrial flutter and bradycardia. History of diabetes, end-stage renal disease EXAM: PORTABLE CHEST 1 VIEW COMPARISON:  PA and lateral chest x-ray of October 04, 2015 FINDINGS: The lungs are mildly hypoinflated. The interstitial markings are mildly increased but are not as conspicuous as on the study of October 04, 2015. The cardiac silhouette remains mildly enlarged. The pulmonary vascularity is not clearly engorged. The mediastinum is normal in width. The bony thorax exhibits no acute abnormality. IMPRESSION: Improved appearance of the pulmonary interstitium since the previous study. Stable mild cardiomegaly without over pulmonary vascular congestion. No evidence of pneumonia. Electronically Signed   By: David  [place] M.D.   On: 10/25/2015 15:17    EKG & Cardiac Imaging    EKG: atrial fibrillation   Echocardiogram: 08/14/15  Study Conclusions  - Left ventricle: The cavity size was normal. Wall thickness was   increased in a pattern of mild LVH. Systolic function was normal.   The estimated ejection fraction was in the range of 60% to 65%.   Indeterminant diastolic function (atrial fibrillation). Wall   motion was normal; there were no regional wall motion    abnormalities. - Aortic valve: Trileaflet; moderately calcified leaflets.   Transvalvular velocity was minimally increased. There was mild   stenosis. There was trivial regurgitation. Mean gradient (S): 11   mm Hg. Valve area (VTI): 1.51 cm^2. Valve area (Vmax): 1.57 cm^2.   Valve area (Vmean): 1.54 cm^2. - Mitral valve: Mildly calcified annulus. There was trivial   regurgitation. - Left atrium: The atrium was moderately dilated. - Right ventricle: The cavity size was normal. Systolic function   was normal. - Right atrium: The atrium was moderately dilated. - Pulmonary arteries:  No complete TR doppler jet so unable to   estimate PA systolic pressure. - Inferior vena cava: The vessel was normal in size. The   respirophasic diameter changes were in the normal range (>= 50%),   consistent with normal central venous pressure.  Impressions:  - Normal LV size with mild LV hypertrophy. EF 60-65%. Normal RV   size and systolic function. Mild aortic stenosis. Moderate   biatrial enlargement.  Assessment & Plan    1. History of PAF: Rates elevated at initial presentation but rate controlled now. Agree with restarting diltiazem current BP is 122/75, no hypotension on diltiazem. Consider consolidating metoprolol to 100mg  XL daily.   This patients CHA2DS2-VASc Score and unadjusted Ischemic Stroke Rate (% per year) is equal to 2.2 % stroke rate/year from a score of 2 Above score calculated as 1 point each if present [CHF, HTN, DM, Vascular=MI/PAD/Aortic Plaque, Age if 65-74, or Male], 2 points each if present [Age > 75, or Stroke/TIA/TE]  Continue warfarin for anticoagulation. Patient has been refusing medications for nursing, might have compliance issues at home.   Had recent Echo in July in 2017 that showed 60-65%, no wall motion abnormalities.   2. HTN: hypotensive to normotensive. Continue beta blocker, can consider increasing for rate control.   3. DM: Management per primary team.    Signed, Little IshikawaErin E Faye Strohman, NP 10/28/2015, 4:21 PM Pager: [phone]

## 2015-10-28 NOTE — Progress Notes (Signed)
ANTICOAGULATION CONSULT NOTE - Follow Up Consult  Pharmacy Consult for warfarin Indication: atrial fibrillation  Allergies  Allergen Reactions  . Diltiazem Other (See Comments)    SEVERE STOMACH CRAMPING - Takes extended release form without problems    Patient Measurements: Height: 6\' 5"  (195.6 cm) Weight: 226 lb 6.6 oz (102.7 kg) IBW/kg (Calculated) : 89.1  Vital Signs: Temp: 97.8 F (36.6 C) (09/28 0639) Temp Source: Oral (09/28 0639) BP: 138/88 (09/28 0803) Pulse Rate: 115 (09/28 1151)  Labs:  Recent Labs  10/25/15 1727 10/25/15 2146 10/26/15 0317 10/26/15 1838 10/27/15 0350 10/28/15 1042  HGB 13.0  --  10.8* 11.2* 10.6*  --   HCT 39.6  --  33.2* 34.7* 33.4*  --   PLT 167  --  143* 143* 147*  --   LABPROT 40.3*  --   --   --  30.9* 22.9*  INR 4.04*  --   --   --  2.90 1.99  CREATININE 9.16*  --  9.97* 11.83* 12.54*  --   TROPONINI  --  0.04* 0.04*  --   --   --    Estimated Creatinine Clearance: 5.9 mL/min (by C-G formula based on SCr of 12.54 mg/dL (H)).  Assessment: 82 y.o.maleadmittedon 9/25/2017with hypotension. Initially treated empirically for sepsis, now thought to be caused by dehydration. Pharmacy has been consulted for warfarin dosing. Patient's INR 4.04 on admission and warfarin held  INR = 1.99  Goal of Therapy:  INR 2-3 Monitor platelets by anticoagulation protocol: Yes   Plan:  Warfarin 7.5mg  x1 tonight Daily INR  Thank you Okey RegalLisa Zehava Turski, PharmD [phone] 10/28/2015 12:55 PM

## 2015-10-28 NOTE — Progress Notes (Signed)
PROGRESS NOTE  [name] [name] ZOX:[id number] DOB: [birth year] DOA: 10/25/2015 PCP: Katy ApoPOLITE,RONALD D, MD  HPI/Recap of past 24 hours: He was sent from HD to ED due to hypotension during dialysis   Subjective.   He denies chest pain or dyspnea.  He agree to continue with HD.  He agree to take medications.   Assessment/Plan: Principal Problem:   Hypotension Active Problems:   Peripheral neuropathy (HCC)   Diabetes mellitus with ESRD (end-stage renal disease) (HCC)   Sinus bradycardia-tachycardia syndrome (HCC)   PAF (paroxysmal atrial fibrillation) (HCC)   Physical deconditioning   Congestive heart failure (HCC)   Essential hypertension   ESRD on dialysis (HCC)   Hypotension in the setting of hemodialysis. Possibly secondary to fluid shifts. Also recent nausea and vomiting. Blood pressure currently stabilized. No evidence for sepsis. Hold off on IV antibiotics for now. I have discussed with nephrology about possible adjusting dry weight target. Cortisol; at 12.  BP medications adjusted.  Had another episode of A fib RVR and hypotension yesterday during HD, dialysis was terminated early because of this.   . Sinus bradycardia-tachycardia syndrome (HCC) stable continue home medications with holding parameters Physical deconditioning will need to discharge with PT OT follow-up.  Marland Kitchen. PAF (paroxysmal atrial fibrillation) (HCC) [name] [place] 5.  Given hypotension will only restart metoprolol with holding parameters and would attempt to restart Cardizem once blood pressure stabilized .  Coumadin per pharmacy to dose.  I have consulted cardiology to help us with medications and [facility]   Marland Kitchen. Essential hypertension hold home medications given recent hypotension. . Diabetes mellitus with ESRD (end-stage renal disease) (HCC) with a sliding scale .End-stage renal disease on HD MWF, nephrology input appreciated  Recent frequent hospitalization, if does not improve,  and no reversible causes, may consider palliative care consult.   DVT prophylaxis:  SCD    Code Status:    DNR/DNI as per patient   Family Communication:   Family not at  Bedside   Disposition Plan:                              Back to current facility when stable                                                  Social Work   Nutrition                              Consultants:  neprhology  Procedures:  HD  Antibiotics:  none   Objective: BP 138/88   Pulse (!) 115   Temp 97.8 F (36.6 C) (Oral)   Resp 18   Ht 6\' 5"  (1.956 m)   Wt 102.7 kg (226 lb 6.6 oz)   SpO2 100%   BMI 26.85 kg/m   Intake/Output Summary (Last 24 hours) at 10/28/15 1341 Last data filed at 10/27/15 1933  Gross per 24 hour  Intake              521 ml  Output              -42 ml  Net              563 ml  Filed Weights   10/25/15 2140 10/27/15 1451 10/27/15 1650  Weight: 100.6 kg (221 lb 12.5 oz) 102.7 kg (226 lb 6.6 oz) 102.7 kg (226 lb 6.6 oz)    Exam:   General:  Chronically ill, NAD, left eye blind ( chronic)  Cardiovascular: IRRR  Respiratory: CTABL  Abdomen: Soft/ND/NT, positive BS  Musculoskeletal: No Edema  Neuro: aaox3  Data Reviewed: Basic Metabolic Panel:  Recent Labs Lab 10/25/15 1727 10/26/15 0317 10/26/15 1838 10/27/15 0350  NA 137 137 139 137  K 4.0 4.0 4.0 4.5  CL 97* 97* 96* 99*  CO2 23 27 27 24   GLUCOSE 164* 217* 173* 136*  BUN 49* 56* 65* 72*  CREATININE 9.16* 9.97* 11.83* 12.54*  CALCIUM 8.8* 8.6* 9.1 9.2  MG  --  2.4  --   --   PHOS  --  5.8* 6.1*  --    Liver Function Tests:  Recent Labs Lab 10/25/15 1727 10/26/15 0317 10/26/15 1838  AST 20 15  --   ALT 22 19  --   ALKPHOS 56 52  --   BILITOT 1.0 0.8  --   PROT 7.4 6.5  --   ALBUMIN 3.2* 2.9* 3.1*   No results for input(s): LIPASE, AMYLASE in the last 168 hours. No results for input(s): AMMONIA in the last 168 hours. CBC:  Recent Labs Lab 10/25/15 1727 10/26/15 0317  10/26/15 1838 10/27/15 0350  WBC 6.2 5.4 5.8 5.6  NEUTROABS 3.5  --   --   --   HGB 13.0 10.8* 11.2* 10.6*  HCT 39.6 33.2* 34.7* 33.4*  MCV 93.8 94.9 95.3 95.4  PLT 167 143* 143* 147*   Cardiac Enzymes:    Recent Labs Lab 10/25/15 2146 10/26/15 0317  TROPONINI 0.04* 0.04*   BNP (last 3 results)  Recent Labs  08/12/15 2143 09/22/15 0833 10/25/15 1727  BNP 276.9* 568.9* 580.8*    ProBNP (last 3 results) No results for input(s): PROBNP in the last 8760 hours.  CBG:  Recent Labs Lab 10/26/15 2121 10/27/15 0704 10/27/15 1135 10/28/15 0610 10/28/15 1034  GLUCAP 135* 168* 208* 134* 215*    Recent Results (from the past 240 hour(s))  Culture, blood (routine x 2)     Status: None (Preliminary result)   Collection Time: 10/25/15  5:27 PM  Result Value Ref Range Status   Specimen Description BLOOD RIGHT WRIST  Final   Special Requests BOTTLES DRAWN AEROBIC AND ANAEROBIC 5CC  Final   Culture NO GROWTH 2 DAYS  Final   Report Status PENDING  Incomplete  Urine culture     Status: Abnormal   Collection Time: 10/25/15  9:05 PM  Result Value Ref Range Status   Specimen Description URINE, RANDOM  Final   Special Requests NONE  Final   Culture MULTIPLE SPECIES PRESENT, SUGGEST RECOLLECTION (A)  Final   Report Status 10/27/2015 FINAL  Final  MRSA PCR Screening     Status: None   Collection Time: 10/25/15 11:51 PM  Result Value Ref Range Status   MRSA by PCR NEGATIVE NEGATIVE Final    Comment:        The GeneXpert MRSA Assay (FDA approved for NASAL specimens only), is one component of a comprehensive MRSA colonization surveillance program. It is not intended to diagnose MRSA infection nor to guide or monitor treatment for MRSA infections.      Studies: No results found.  Scheduled Meds: . atorvastatin  10 mg Oral q1800  . calcium acetate  1,334 mg Oral TID WC  . cinacalcet  60 mg Oral Q supper  . diltiazem  360 mg Oral Daily  . docusate sodium  100 mg Oral  BID  . dorzolamide  1 drop Right Eye BID   And  . timolol  1 drop Right Eye BID  . doxercalciferol  2 mcg Intravenous Q M,W,F-HD  . feeding supplement (NEPRO CARB STEADY)  237 mL Oral BID BM  . ferric gluconate (FERRLECIT/NULECIT) IV  62.5 mg Intravenous Weekly  . insulin aspart  0-5 Units Subcutaneous QHS  . insulin aspart  0-9 Units Subcutaneous TID WC  . latanoprost  1 drop Right Eye QHS  . metoprolol  25 mg Oral Q6H  . prednisoLONE acetate  1 drop Left Eye BID  . sodium chloride  250 mL Intravenous Once  . sodium chloride flush  3 mL Intravenous Q12H  . sodium chloride flush  3 mL Intravenous Q12H  . tobramycin-dexamethasone  1 drop Left Eye QID  . warfarin  7.5 mg Oral ONCE-1800  . Warfarin - Pharmacist Dosing Inpatient   Does not apply q1800    Continuous Infusions:    Time spent:  Hartley Barefoot A MD  [facility] Pager [phone] If 7PM-7AM, please contact night-coverage at [email or web address], password [facility] 10/28/2015, 1:41 PM  LOS: 0 days

## 2015-10-28 NOTE — Progress Notes (Signed)
Pt refuses blood draw for AM labs. He refuses all his meds. A fib with rates between 120-140s. Pt asymptomatic and resting comfortably. I have explain purpose of each med and their benefits. I have also explain the need for meds for his heart rate and health but pt continues to refuse them. MD has been made aware.

## 2015-10-28 NOTE — NC FL2 (Signed)
[place] MEDICAID FL2 LEVEL OF CARE SCREENING TOOL     IDENTIFICATION  Patient Name: [name] [name] Birthdate: 1933/10/10 Sex: male Admission Date (Current Location): 10/25/2015  [facility] [place]   Facility and Address[facility]  [facility], [address], [place], [place] [address]      Provider Number: [id number]  Attending Physician Name and Address:  Alba CoryBelkys A Regalado, MD  Relative Name and Phone Number:  Renee RamusBarbara [name] - wife.  Phone #[phone]    Current Level of Care: Hospital Recommended Level of Care: Skilled Nursing Facility Prior Approval Number:    Date Approved/Denied: 10/28/15 PASRR Number:  [id number]   Discharge Plan: Home    Current Diagnoses: Patient Active Problem List   Diagnosis Date Noted  . Hypotension 10/25/2015  . Leg weakness, bilateral   . Palliative care encounter   . Goals of care, counseling/discussion   . Weakness 10/04/2015  . Generalized weakness   . Essential hypertension   . ESRD on dialysis (HCC)   . Congestive heart failure (HCC)   . PAF (paroxysmal atrial fibrillation) (HCC) 04/20/2015  . Encephalopathy 04/20/2015  . Physical deconditioning 04/20/2015  . Diabetes mellitus with complication (HCC) 04/20/2015  . Acute respiratory failure with hypoxia (HCC)   . Atrial flutter with rapid ventricular response (HCC)   . Acute respiratory failure (HCC) 04/12/2015  . HCAP (healthcare-associated pneumonia) 04/12/2015  . Elevated troponin 04/12/2015  . Anemia 04/12/2015  . Bradycardia 11/19/2014  . Encounter for therapeutic drug monitoring 06/16/2014  . Sinus bradycardia-tachycardia syndrome (HCC) 01/11/2012    Class: Chronic  . Atrial flutter (HCC) 01/10/2012  . Blind left eye 01/10/2012  . ESRD (end stage renal disease) (HCC) 01/10/2012  . Atrial fibrillation with RVR (HCC) 01/10/2012  . Hypertension   . Peripheral neuropathy (HCC)   . Diabetes mellitus with ESRD (end-stage renal  disease) (HCC)     Orientation RESPIRATION BLADDER Height & Weight     Self, Time, Situation, Place  Normal Continent Weight: 226 lb 6.6 oz (102.7 kg) Height:  6\' 5"  (195.6 cm)  BEHAVIORAL SYMPTOMS/MOOD NEUROLOGICAL BOWEL NUTRITION STATUS   (none)  (None) Continent Diet (Renal/ Carb modified)  AMBULATORY STATUS COMMUNICATION OF NEEDS Skin   Extensive Assist Verbally Normal                       Personal Care Assistance Level of Assistance  Bathing, Feeding, Dressing Bathing Assistance: Limited assistance Feeding assistance: Independent Dressing Assistance: Limited assistance     Functional Limitations Info  Sight, Hearing, Speech Sight Info: Impaired Hearing Info: Adequate Speech Info: Impaired    SPECIAL CARE FACTORS FREQUENCY  PT (By licensed PT), OT (By licensed OT)     PT Frequency: 5/ week OT Frequency: 5/ week            Contractures Contractures Info: Not present    Additional Factors Info  Code Status, Allergies Code Status Info: DNR Allergies Info: Diltiazem   Insulin Sliding Scale Info: insulin aspart (novoLOG) injection 0-9 Units Dose: 0-9 Units Freq: 3 times daily with meals Route: [place]       Current Medications (10/28/2015):  This is the current hospital active medication list Current Facility-Administered Medications  Medication Dose Route Frequency Provider Last Rate Last Dose  . 0.9 %  sodium chloride infusion  250 mL Intravenous PRN Therisa DoyneAnastassia Doutova, MD      . 0.9 %  sodium chloride infusion  100 mL Intravenous  PRN Bufford Buttner, MD      . 0.9 %  sodium chloride infusion  100 mL Intravenous PRN Bufford Buttner, MD      . acetaminophen (TYLENOL) tablet 650 mg  650 mg Oral Q6H PRN Therisa Doyne, MD       Or  . acetaminophen (TYLENOL) suppository 650 mg  650 mg Rectal Q6H PRN Therisa Doyne, MD      . alteplase (CATHFLO ACTIVASE) injection 2 mg  2 mg Intracatheter Once PRN Bufford Buttner, MD      . atorvastatin (LIPITOR) tablet  10 mg  10 mg Oral q1800 Therisa Doyne, MD   10 mg at 10/27/15 1835  . calcium acetate (PHOSLO) capsule 1,334 mg  1,334 mg Oral TID WC Therisa Doyne, MD   1,334 mg at 10/28/15 0651  . cinacalcet (SENSIPAR) tablet 60 mg  60 mg Oral Q supper Therisa Doyne, MD   60 mg at 10/27/15 1835  . diltiazem (CARDIZEM CD) 24 hr capsule 360 mg  360 mg Oral Daily Leda Gauze, NP   360 mg at 10/28/15 0650  . docusate sodium (COLACE) capsule 100 mg  100 mg Oral BID Therisa Doyne, MD   100 mg at 10/26/15 0006  . dorzolamide (TRUSOPT) 2 % ophthalmic solution 1 drop  1 drop Right Eye BID Therisa Doyne, MD   1 drop at 10/28/15 1031   And  . timolol (TIMOPTIC) 0.5 % ophthalmic solution 1 drop  1 drop Right Eye BID Therisa Doyne, MD   1 drop at 10/28/15 1033  . doxercalciferol (HECTOROL) injection 2 mcg  2 mcg Intravenous Q M,W,F-HD Bufford Buttner, MD   2 mcg at 10/27/15 1641  . feeding supplement (NEPRO CARB STEADY) liquid 237 mL  237 mL Oral BID BM Albertine Grates, MD   237 mL at 10/27/15 1049  . ferric gluconate (NULECIT) 62.5 mg in sodium chloride 0.9 % 100 mL IVPB  62.5 mg Intravenous Weekly Bufford Buttner, MD      . guaiFENesin (ROBITUSSIN) 100 MG/5ML solution 300 mg  15 mL Oral Q4H PRN Belkys A Regalado, MD      . heparin injection 1,000 Units  1,000 Units Dialysis PRN Bufford Buttner, MD      . heparin injection 4,200 Units  4,200 Units Dialysis PRN Bufford Buttner, MD      . insulin aspart (novoLOG) injection 0-5 Units  0-5 Units Subcutaneous QHS Therisa Doyne, MD   2 Units at 10/26/15 0007  . insulin aspart (novoLOG) injection 0-9 Units  0-9 Units Subcutaneous TID WC Therisa Doyne, MD   2 Units at 10/28/15 0651  . latanoprost (XALATAN) 0.005 % ophthalmic solution 1 drop  1 drop Right Eye QHS Therisa Doyne, MD   1 drop at 10/27/15 0029  . lidocaine (PF) (XYLOCAINE) 1 % injection 5 mL  5 mL Intradermal PRN Bufford Buttner, MD      . lidocaine-prilocaine (EMLA) cream 1  application  1 application Topical PRN Bufford Buttner, MD      . metoprolol tartrate (LOPRESSOR) tablet 25 mg  25 mg Oral Q6H Therisa Doyne, MD   25 mg at 10/28/15 0803  . ondansetron (ZOFRAN) tablet 4 mg  4 mg Oral Q6H PRN Therisa Doyne, MD       Or  . ondansetron (ZOFRAN) injection 4 mg  4 mg Intravenous Q6H PRN Therisa Doyne, MD      . pentafluoroprop-tetrafluoroeth (GEBAUERS) aerosol 1 application  1 application Topical PRN Bufford Buttner, MD      .  prednisoLONE acetate (PRED FORTE) 1 % ophthalmic suspension 1 drop  1 drop Left Eye BID Therisa Doyne, MD   1 drop at 10/28/15 1035  . sodium chloride 0.9 % bolus 250 mL  250 mL Intravenous Once Albertine Grates, MD      . sodium chloride flush (NS) 0.9 % injection 3 mL  3 mL Intravenous Q12H Therisa Doyne, MD   3 mL at 10/28/15 1040  . sodium chloride flush (NS) 0.9 % injection 3 mL  3 mL Intravenous Q12H Therisa Doyne, MD   3 mL at 10/28/15 1040  . sodium chloride flush (NS) 0.9 % injection 3 mL  3 mL Intravenous PRN Therisa Doyne, MD      . tobramycin-dexamethasone (TOBRADEX) ophthalmic suspension 1 drop  1 drop Left Eye QID Therisa Doyne, MD   1 drop at 10/28/15 1036  . Warfarin - Pharmacist Dosing Inpatient   Does not apply q1800 Belkys A Sunnie Nielsen, MD         Discharge Medications: Please see discharge summary for a list of discharge medications.  Relevant Imaging Results:  Relevant Lab Results:   Additional Information [id number].  Dialysis MWF - [address]).  [facility], LCSW

## 2015-10-28 NOTE — Clinical Social Work Note (Signed)
Clinical Social Work Assessment  Patient Details  Name: [name] [name] MRN: [id number] Date of Birth: 07/26/1933  Date of referral:  10/28/15               Reason for consult:  Discharge Planning                Permission sought to share information with:  Family Supports Permission granted to share information::  Yes, Release of Information Signed  Name::     Renee RamusBarbara Borkowski   Agency::     Relationship::  Wife   Contact Information:  [phone]  Housing/Transportation Living arrangements for the past 2 months:  Single Family Home Source of Information:  Spouse Patient Interpreter Needed:  None Criminal Activity/Legal Involvement Pertinent to Current Situation/Hospitalization:  No - Comment as needed Significant Relationships:  Spouse Lives with:  Spouse Do you feel safe going back to the place where you live?  No Need for family participation in patient care:  Yes (Comment)  Care giving concerns:  [place] [place] Patient's wife  would like patient to rebuild his strength to return home.     Social Worker assessment / plan:  [place]. CSW called patient's wife she reported she would [place] [facility] to [place] [facility] once medically stable. CSW will continue to follow for discharge needs.   Employment status:  Retired Database administratornsurance information:  Managed Medicare PT Recommendations:  Skilled Nursing Facility Information / Referral to community resources:  Skilled Nursing Facility  Patient/Family's Response to care:  The patient's wife  is happy with the care the patient has received.   Patient/Family's Understanding of and Emotional Response to Diagnosis, Current Treatment, and Prognosis:  The patient's wife has a good understanding of why the patient was admitted.  She understands the care plan and what the patient  will need post discharge.  Emotional Assessment Appearance:   (unable to assess) Attitude/Demeanor/Rapport:  Unable to  Assess Affect (typically observed):  Unable to Assess Orientation:  Oriented to Self, Oriented to Place, Oriented to  Time, Oriented to Situation Alcohol / Substance use:  Not Applicable Psych involvement (Current and /or in the community):  No (Comment)  Discharge Needs  Concerns to be addressed:  Discharge Planning Concerns Readmission within the last 30 days:  Yes Current discharge risk:  Physical Impairment Barriers to Discharge:  Continued Medical Work up   [employer]Shonda A Jashanti Clinkscale, LCSW 10/28/2015, 10:54 AM

## 2015-10-28 NOTE — Progress Notes (Signed)
Physical Therapy Treatment Patient Details Name: [name] [name] MRN: [id number] DOB: [birth year] Today's Date: 10/28/2015    History of Present Illness [name] [name] is a 82 y.o. male with PMHx:ESRD on HD MWF, HTN, diabetes, atrial fibrillation with RVR. Admitted from HD with hypotension    PT Comments    Pt with decreased strength and function today who was unable to fully stand or pivot to chair despite repeated attempts and 2 person assist. Pt educated for HEP, encouraged to progress mobility and continue attempts at OOB. Pt with more negative outlook today and provided encouragement. Will continue to follow.   HR 115  Follow Up Recommendations  SNF;Supervision/Assistance - 24 hour     Equipment Recommendations       Recommendations for Other Services       Precautions / Restrictions Precautions Precautions: Fall    Mobility  Bed Mobility Overal bed mobility: Needs Assistance Bed Mobility: Supine to Sit;Sit to Supine     Supine to sit: Mod assist Sit to supine: Min guard   General bed mobility comments: cues for assist to lift trunk from surface and rotate pelvis to EOB. Pt able to return to bed with increased time but no physical assist  Transfers Overall transfer level: Needs assistance   Transfers: Sit to/from Stand Sit to Stand: Max assist;+2 physical assistance;From elevated surface         General transfer comment: with bil knees blocked, bed elevated and attempts x 3 to stand pt able to shift weight anteriorly and rise from surface, only once fully clearing sacrum but unable to extend trunk or maintain standing. Pt returned to bed  Ambulation/Gait                 Stairs            Wheelchair Mobility    Modified Rankin (Stroke Patients Only)       Balance Overall balance assessment: Needs assistance   Sitting balance-Leahy Scale: Fair Sitting balance - Comments: initially with posterior lean and then transitioned to anterior  lean EOB with eventual midline position x 2 min- min assist to minguard for balance                            Cognition Arousal/Alertness: Awake/alert Behavior During Therapy: Flat affect Overall Cognitive Status: Impaired/Different from baseline Area of Impairment: Memory;Orientation Orientation Level: Disoriented to;Time   Memory: Decreased short-term memory              Exercises General Exercises - Lower Extremity Long Arc Quad: AROM;Both;10 reps;Seated Hip Flexion/Marching: AAROM;Both;10 reps;Seated    General Comments        Pertinent Vitals/Pain Pain Assessment: No/denies pain    Home Living                      Prior Function            PT Goals (current goals can now be found in the care plan section) Progress towards PT goals: Not progressing toward goals - comment (pt with decreased mobility this session)    Frequency           PT Plan Current plan remains appropriate    Co-evaluation             End of Session Equipment Utilized During Treatment: Gait belt Activity Tolerance: Patient limited by fatigue Patient left: in bed;with call bell/phone within reach  Time: 1610-9604 PT Time Calculation (min) (ACUTE ONLY): 21 min  Charges:  $Therapeutic Activity: 8-22 mins                    G Codes:      Delorse Lek 02-Nov-2015, 11:56 AM  Delaney Meigs, PT [phone]

## 2015-10-28 NOTE — Progress Notes (Signed)
Pt has been refusing care during the night including labs and meds. Per RN, this am, he agrees to take meds. His HR has been Afib 120s-140s during night. Will give his missed metoprolol now and add back home cardizem with holding parameters for hypotension.  KJKG, NP [facility]

## 2015-10-29 DIAGNOSIS — I471 Supraventricular tachycardia: Secondary | ICD-10-CM

## 2015-10-29 LAB — GLUCOSE, CAPILLARY
GLUCOSE-CAPILLARY: 134 mg/dL — AB (ref 65–99)
GLUCOSE-CAPILLARY: 246 mg/dL — AB (ref 65–99)
GLUCOSE-CAPILLARY: 198 mg/dL — AB (ref 65–99)
Glucose-Capillary: 232 mg/dL — ABNORMAL HIGH (ref 65–99)

## 2015-10-29 LAB — CBC
HEMATOCRIT: 31.9 % — AB (ref 39.0–52.0)
Hemoglobin: 10.6 g/dL — ABNORMAL LOW (ref 13.0–17.0)
MCH: 30.8 pg (ref 26.0–34.0)
MCHC: 33.2 g/dL (ref 30.0–36.0)
MCV: 92.7 fL (ref 78.0–100.0)
PLATELETS: 147 10*3/uL — AB (ref 150–400)
RBC: 3.44 MIL/uL — ABNORMAL LOW (ref 4.22–5.81)
RDW: 15.3 % (ref 11.5–15.5)
WBC: 6.1 10*3/uL (ref 4.0–10.5)

## 2015-10-29 LAB — RENAL FUNCTION PANEL
Albumin: 2.9 g/dL — ABNORMAL LOW (ref 3.5–5.0)
Anion gap: 16 — ABNORMAL HIGH (ref 5–15)
BUN: 70 mg/dL — AB (ref 6–20)
CHLORIDE: 91 mmol/L — AB (ref 101–111)
CO2: 22 mmol/L (ref 22–32)
CREATININE: 12.44 mg/dL — AB (ref 0.61–1.24)
Calcium: 8.9 mg/dL (ref 8.9–10.3)
GFR, EST AFRICAN AMERICAN: 4 mL/min — AB (ref >60)
GFR, EST NON AFRICAN AMERICAN: 3 mL/min — AB (ref >60)
Glucose, Bld: 200 mg/dL — ABNORMAL HIGH (ref 65–99)
POTASSIUM: 4.3 mmol/L (ref 3.5–5.1)
Phosphorus: 7.1 mg/dL — ABNORMAL HIGH (ref 2.5–4.6)
Sodium: 129 mmol/L — ABNORMAL LOW (ref 135–145)

## 2015-10-29 MED ORDER — METOPROLOL SUCCINATE ER 50 MG PO TB24
50.0000 mg | ORAL_TABLET | Freq: Every day | ORAL | Status: DC
  Administered 2015-10-31 – 2015-11-01 (×2): 50 mg via ORAL
  Filled 2015-10-29 (×2): qty 1

## 2015-10-29 MED ORDER — AMIODARONE LOAD VIA INFUSION
150.0000 mg | Freq: Once | INTRAVENOUS | Status: AC
  Administered 2015-10-29: 150 mg via INTRAVENOUS
  Filled 2015-10-29: qty 83.34

## 2015-10-29 MED ORDER — DOXERCALCIFEROL 4 MCG/2ML IV SOLN
INTRAVENOUS | Status: AC
  Filled 2015-10-29: qty 2

## 2015-10-29 MED ORDER — AMIODARONE HCL IN DEXTROSE 360-4.14 MG/200ML-% IV SOLN
60.0000 mg/h | INTRAVENOUS | Status: AC
  Administered 2015-10-29 (×2): 60 mg/h via INTRAVENOUS
  Filled 2015-10-29 (×3): qty 200

## 2015-10-29 MED ORDER — WARFARIN SODIUM 3 MG PO TABS
6.0000 mg | ORAL_TABLET | Freq: Once | ORAL | Status: AC
  Administered 2015-10-29: 6 mg via ORAL
  Filled 2015-10-29: qty 2

## 2015-10-29 MED ORDER — AMIODARONE HCL IN DEXTROSE 360-4.14 MG/200ML-% IV SOLN
30.0000 mg/h | INTRAVENOUS | Status: DC
  Administered 2015-10-30: 30 mg/h via INTRAVENOUS
  Filled 2015-10-29: qty 200

## 2015-10-29 NOTE — Progress Notes (Addendum)
DAILY PROGRESS NOTE  Subjective:  Recurrent tachycardia at dialysis today HR in the 130's, looks like it could be recurrent SVT (prior ablation) or could be atrial flutter- BP stable. He is asymptomatic, however, it is limiting ultrafiltration.  Objective:  Temp:  [97.7 F (36.5 C)-98.2 F (36.8 C)] 97.8 F (36.6 C) (09/29 0730) Pulse Rate:  [62-138] 130 (09/29 1030) Resp:  [17-20] 18 (09/29 1030) BP: (112-152)/(72-91) 112/81 (09/29 1030) SpO2:  [95 %-100 %] 95 % (09/29 0730) Weight:  [229 lb 4.5 oz (104 kg)] 229 lb 4.5 oz (104 kg) (09/29 0730) Weight change:   Intake/Output from previous day: No intake/output data recorded.  Intake/Output from this shift: No intake/output data recorded.  Medications: No current facility-administered medications on file prior to encounter.    Current Outpatient Prescriptions on File Prior to Encounter  Medication Sig Dispense Refill  . acetaminophen (TYLENOL) 325 MG tablet Take 650 mg by mouth daily as needed for mild pain.    Marland Kitchen atorvastatin (LIPITOR) 10 MG tablet Take 1 tablet (10 mg total) by mouth daily at 6 PM. 30 tablet 6  . B Complex-C-Folic Acid (RENA-VITE PO) Take 1 tablet by mouth daily.    . Calcium Acetate 667 MG TABS Take 667-1,334 mg by mouth See admin instructions. 667 mg for snacks, 1334 mg for meals    . diltiazem (CARDIZEM CD) 360 MG 24 hr capsule Take 1 capsule (360 mg total) by mouth daily. 30 capsule 6  . docusate sodium (COLACE) 100 MG capsule Take 100 mg by mouth 2 (two) times daily.     . dorzolamide-timolol (COSOPT) 22.3-6.8 MG/ML ophthalmic solution Place 1 drop into the right eye 2 (two) times daily.     Marland Kitchen lidocaine-prilocaine (EMLA) cream Apply 1 application topically as needed (apply to skin near dialysis port before dialysis).     . metoprolol (LOPRESSOR) 50 MG tablet Take 1 tablet (50 mg total) by mouth 2 (two) times daily. 60 tablet 6  . neomycin-polymyxin b-dexamethasone (MAXITROL) 3.5-10000-0.1 SUSP Place 1  drop into the left eye 4 (four) times daily.    . prednisoLONE acetate (PRED FORTE) 1 % ophthalmic suspension Place 1 drop into the left eye 2 (two) times daily. Patient to use for 37 days. Filled on 03-28-15    . SENSIPAR 60 MG tablet Take 60 mg by mouth at bedtime.     Marland Kitchen tobramycin-dexamethasone (TOBRADEX) ophthalmic solution Place 1 drop into the left eye 4 (four) times daily. For 37 days. Filled 03-31-15    . TRAVATAN Z 0.004 % SOLN ophthalmic solution Place 1 drop into the right eye at bedtime.     Marland Kitchen warfarin (COUMADIN) 5 MG tablet Take as directed by [facility] (Patient taking differently: Take 6 mg by mouth See admin instructions. 7.5 mg daily) 50 tablet 3    Physical Exam: General appearance: alert, no distress and seen on dialysis Lungs: clear to auscultation bilaterally Heart: regular tachycardia Extremities: extremities normal, atraumatic, no cyanosis or edema Neurologic: Grossly normal  Lab Results: Results for orders placed or performed during the hospital encounter of 10/25/15 (from the past 48 hour(s))  Glucose, capillary     Status: Abnormal   Collection Time: 10/27/15 11:35 AM  Result Value Ref Range   Glucose-Capillary 208 (H) 65 - 99 mg/dL  Glucose, capillary     Status: Abnormal   Collection Time: 10/28/15  6:10 AM  Result Value Ref Range   Glucose-Capillary 134 (H) 65 - 99 mg/dL  Glucose,  capillary     Status: Abnormal   Collection Time: 10/28/15 10:34 AM  Result Value Ref Range   Glucose-Capillary 215 (H) 65 - 99 mg/dL   Comment 1 Notify RN    Comment 2 Document in Chart   Protime-INR     Status: Abnormal   Collection Time: 10/28/15 10:42 AM  Result Value Ref Range   Prothrombin Time 22.9 (H) 11.4 - 15.2 seconds   INR 1.99   Glucose, capillary     Status: Abnormal   Collection Time: 10/28/15  4:26 PM  Result Value Ref Range   Glucose-Capillary 143 (H) 65 - 99 mg/dL   Comment 1 Notify RN    Comment 2 Document in Chart   Glucose, capillary     Status:  Abnormal   Collection Time: 10/28/15  9:27 PM  Result Value Ref Range   Glucose-Capillary 213 (H) 65 - 99 mg/dL  Glucose, capillary     Status: Abnormal   Collection Time: 10/29/15  6:28 AM  Result Value Ref Range   Glucose-Capillary 232 (H) 65 - 99 mg/dL  CBC     Status: Abnormal   Collection Time: 10/29/15  9:27 AM  Result Value Ref Range   WBC 6.1 4.0 - 10.5 K/uL   RBC 3.44 (L) 4.22 - 5.81 MIL/uL   Hemoglobin 10.6 (L) 13.0 - 17.0 g/dL   HCT 31.9 (L) 39.0 - 52.0 %   MCV 92.7 78.0 - 100.0 fL   MCH 30.8 26.0 - 34.0 pg   MCHC 33.2 30.0 - 36.0 g/dL   RDW 15.3 11.5 - 15.5 %   Platelets 147 (L) 150 - 400 K/uL  Renal function panel     Status: Abnormal   Collection Time: 10/29/15  9:29 AM  Result Value Ref Range   Sodium 129 (L) 135 - 145 mmol/L   Potassium 4.3 3.5 - 5.1 mmol/L   Chloride 91 (L) 101 - 111 mmol/L   CO2 22 22 - 32 mmol/L   Glucose, Bld 200 (H) 65 - 99 mg/dL   BUN 70 (H) 6 - 20 mg/dL   Creatinine, Ser 12.44 (H) 0.61 - 1.24 mg/dL   Calcium 8.9 8.9 - 10.3 mg/dL   Phosphorus 7.1 (H) 2.5 - 4.6 mg/dL   Albumin 2.9 (L) 3.5 - 5.0 g/dL   GFR calc non Af Amer 3 (L) >60 mL/min   GFR calc Af Amer 4 (L) >60 mL/min    Comment: (NOTE) The eGFR has been calculated using the CKD EPI equation. This calculation has not been validated in all clinical situations. eGFR's persistently <60 mL/min signify possible Chronic Kidney Disease.    Anion gap 16 (H) 5 - 15    Imaging: No results found.  Assessment:  1. Principal Problem: 2.   Hypotension 3. Active Problems: 4.   Peripheral neuropathy ([place]) 5.   Diabetes mellitus with ESRD (end-stage renal disease) ([place]) 6.   Sinus bradycardia-tachycardia syndrome (HCC) 7.   PAF (paroxysmal atrial fibrillation) ([place]) 8.   Physical deconditioning 9.   Congestive heart failure ([place]) 10.   Essential hypertension 11.   ESRD on dialysis ([place]) 12.   Plan:  1. Given recurrent arrhythmias and inability to effectively ultrafiltrate at  dialysis for volume overload and hypotension, due to tachycardia, would recommend starting antiarrythmic medication. Change short acting metoprolol to Toprol XL 50 mg daily (reduced dose) - give now. Will load amiodarone IV (to start after dialysis) and transition to po amiodarone to continue as an outpatient  after discharge. D/w Dr. Hollie Salk at dialysis.  Time Spent Directly with Patient:  15 minutes  Length of Stay:  LOS: 1 day   Pixie Casino, MD, [facility] Attending Cardiologist [place] 10/29/2015, 10:52 AM

## 2015-10-29 NOTE — Progress Notes (Signed)
[name] Progress Note   Assessment/ Plan:    Dialyzes at [facility]  EDW 97.5. HD 4 hrs Bath 2K / 2Ca, Dialyzer F180, Heparin 4200 bolus Access LUE AVF Hectorol 2 mcg q treatment Mircera 50 q 4 weeks (given 9/13) Venofer 50 mg q weekly   1 Afib with RVR./ hypotension: dialysis had to be terminated 9/27 after 2 hrs due to tachycardia to 150s and hypotension.  On Metop which is dialyzed off during treatment; cardizem restarted, plan for starting amio per cardiology 2 ESRD: MWF.  plan HD 9/29, Na 129 today which is likely due to volume.  2K bath, K 4.3 3 Volume: EDW raised from 97--> 97.5 as an outpatient; becoming more vol up due to inability to UF .  Will have to have an extra treatment after amio started.  Plan for 9/30 4. Anemia of ESRD: Mircera started on 9/13, not due until 10/13.  Hgb 10.6 on 9/29. On weekly Fe 5. Metabolic Bone Disease: PTH 240, Phos 6.5, corrected Ca 9.9.  On hectorol and Phoslo 2 tabs TID with meals in addition to sensipar 60 mg daily.  phos 7.1, switching to renal diet with fluid restriction.   6.  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  Does not appear to be at imminent risk of rupture, VVS consulted and pt does not desire surgery for this ever and he reiterated this to me as well. Cannulating away from thinned sites. 7.  Nutrition: nepro supps, albumin 2.9. 8.  Coughing: ? Microaspiration.  Consider SLP eval.  Subjective:    Coughing intermittently, in RVR/ flutter at HR 138.   Objective:   BP 112/81   Pulse (!) 130   Temp 97.8 F (36.6 C) (Oral)   Resp 18   Ht 6\' 5"  (1.956 m)   Wt 104 kg (229 lb 4.5 oz)   SpO2 95%   BMI 27.19 kg/m   Physical Exam: GEN:NAD, coughing intermittently HEENT: blind, MMM, edentulous NECK: no JVD PULM: some coarse breath sounds throughout CV :  Tachy, irregular, soft systolic murmur ABD: obese, nontender, nondistended, NABS EXT: 1+ LE edema LUE AVF with aneuysmal areas with hypopigmented.  No frank  ulcers. NEURO: AAO x 3  Labs: [employer]  Recent Labs Lab 10/25/15 1727 10/26/15 0317 10/26/15 1838 10/27/15 0350 10/29/15 0929  NA 137 137 139 137 129*  K 4.0 4.0 4.0 4.5 4.3  CL 97* 97* 96* 99* 91*  CO2 23 27 27 24 22   GLUCOSE 164* 217* 173* 136* 200*  BUN 49* 56* 65* 72* 70*  CREATININE 9.16* 9.97* 11.83* 12.54* 12.44*  CALCIUM 8.8* 8.6* 9.1 9.2 8.9  PHOS  --  5.8* 6.1*  --  7.1*   CBC  Recent Labs Lab 10/25/15 1727 10/26/15 0317 10/26/15 1838 10/27/15 0350 10/29/15 0927  WBC 6.2 5.4 5.8 5.6 6.1  NEUTROABS 3.5  --   --   --   --   HGB 13.0 10.8* 11.2* 10.6* 10.6*  HCT 39.6 33.2* 34.7* 33.4* 31.9*  MCV 93.8 94.9 95.3 95.4 92.7  PLT 167 143* 143* 147* 147*    @IMGRELPRIORS @ Medications:    . atorvastatin  10 mg Oral q1800  . calcium acetate  1,334 mg Oral TID WC  . cinacalcet  60 mg Oral Q supper  . diltiazem  360 mg Oral Daily  . docusate sodium  100 mg Oral BID  . dorzolamide  1 drop Right Eye BID   And  . timolol  1 drop Right Eye BID  . doxercalciferol  2 mcg Intravenous Q M,W,F-HD  . feeding supplement (NEPRO CARB STEADY)  237 mL Oral BID BM  . ferric gluconate (FERRLECIT/NULECIT) IV  62.5 mg Intravenous Weekly  . insulin aspart  0-5 Units Subcutaneous QHS  . insulin aspart  0-9 Units Subcutaneous TID WC  . latanoprost  1 drop Right Eye QHS  . metoprolol  25 mg Oral Q6H  . prednisoLONE acetate  1 drop Left Eye BID  . sodium chloride  250 mL Intravenous Once  . sodium chloride flush  3 mL Intravenous Q12H  . sodium chloride flush  3 mL Intravenous Q12H  . tobramycin-dexamethasone  1 drop Left Eye QID  . Warfarin - Pharmacist Dosing Inpatient   Does not apply q1800     Bufford Buttner, MD [facility] Cell [phone] pgr [phone] 10/29/2015, 10:54 AM

## 2015-10-29 NOTE — Progress Notes (Signed)
PROGRESS NOTE  [name] ZOX:[id number] DOB: August 21, 1933 DOA: 10/25/2015 PCP: Katy ApoPOLITE,RONALD D, MD  HPI/Recap of past 24 hours: He was sent from HD to ED due to hypotension during dialysis   Subjective.  No complaints.  Was only able to have 1 L fluid removed during HD>   Assessment/Plan: Principal Problem:   Hypotension Active Problems:   Peripheral neuropathy (HCC)   Diabetes mellitus with ESRD (end-stage renal disease) (HCC)   Sinus bradycardia-tachycardia syndrome (HCC)   PAF (paroxysmal atrial fibrillation) (HCC)   Physical deconditioning   Congestive heart failure (HCC)   Essential hypertension   ESRD on dialysis (HCC)   Paroxysmal SVT (supraventricular tachycardia) (HCC)   Hypotension in the setting of hemodialysis. Possibly secondary to fluid shifts. Also recent nausea and vomiting. Blood pressure currently stabilized. No evidence for sepsis. Hold off on IV antibiotics for now. I have discussed with nephrology about possible adjusting dry weight target. Cortisol; at 12.  BP medications adjusted.  Limitation with HD due to hypotension and A fib.  Appreciate cardiology assistance, plan for Amiodarone.   Sinus bradycardia-tachycardia syndrome (HCC) stable continue home medications with holding parameters Physical deconditioning will need to discharge with PT OT follow-up.   PAF (paroxysmal atrial fibrillation) (HCC) [name] [place] 5.  Coumadin per pharmacy to dose.  Appreciate cardiology assistance, plan to load with amiodarone, and change metoprolol to Toprol.   Essential hypertension hold home medications given recent hypotension.  Diabetes mellitus with ESRD (end-stage renal disease) (HCC) with a sliding scale  End-stage renal disease on HD MWF, nephrology input appreciated  Recent frequent hospitalization, if does not improve, and no reversible causes, may consider palliative care consult.   DVT prophylaxis:  SCD    Code Status:    DNR/DNI as  per patient   Family Communication:   Family not at  Bedside   Disposition Plan:                              Back to current facility when stable                                                  Social Work   Nutrition                              Consultants:  neprhology  Procedures:  HD  Antibiotics:  none   Objective: BP 127/82 (BP Location: Right Arm)   Pulse (!) 125   Temp 97.7 F (36.5 C) (Oral)   Resp 17   Ht 6\' 5"  (1.956 m)   Wt 104 kg (229 lb 4.5 oz)   SpO2 95%   BMI 27.19 kg/m   Intake/Output Summary (Last 24 hours) at 10/29/15 1315 Last data filed at 10/29/15 1140  Gross per 24 hour  Intake                0 ml  Output               15 ml  Net              -15 ml   Filed Weights   10/27/15 1451 10/27/15 1650 10/29/15 0730  Weight: 102.7 kg (226 lb 6.6 oz)  102.7 kg (226 lb 6.6 oz) 104 kg (229 lb 4.5 oz)    Exam:   General:  Chronically ill, NAD, left eye blind ( chronic)  Cardiovascular: IRRR  Respiratory: CTABL  Abdomen: Soft/ND/NT, positive BS  Musculoskeletal: No Edema  Neuro: aaox3  Data Reviewed: Basic Metabolic Panel:  Recent Labs Lab 10/25/15 1727 10/26/15 0317 10/26/15 1838 10/27/15 0350 10/29/15 0929  NA 137 137 139 137 129*  K 4.0 4.0 4.0 4.5 4.3  CL 97* 97* 96* 99* 91*  CO2 23 27 27 24 22   GLUCOSE 164* 217* 173* 136* 200*  BUN 49* 56* 65* 72* 70*  CREATININE 9.16* 9.97* 11.83* 12.54* 12.44*  CALCIUM 8.8* 8.6* 9.1 9.2 8.9  MG  --  2.4  --   --   --   PHOS  --  5.8* 6.1*  --  7.1*   Liver Function Tests:  Recent Labs Lab 10/25/15 1727 10/26/15 0317 10/26/15 1838 10/29/15 0929  AST 20 15  --   --   ALT 22 19  --   --   ALKPHOS 56 52  --   --   BILITOT 1.0 0.8  --   --   PROT 7.4 6.5  --   --   ALBUMIN 3.2* 2.9* 3.1* 2.9*   No results for input(s): LIPASE, AMYLASE in the last 168 hours. No results for input(s): AMMONIA in the last 168 hours. CBC:  Recent Labs Lab 10/25/15 1727 10/26/15 0317  10/26/15 1838 10/27/15 0350 10/29/15 0927  WBC 6.2 5.4 5.8 5.6 6.1  NEUTROABS 3.5  --   --   --   --   HGB 13.0 10.8* 11.2* 10.6* 10.6*  HCT 39.6 33.2* 34.7* 33.4* 31.9*  MCV 93.8 94.9 95.3 95.4 92.7  PLT 167 143* 143* 147* 147*   Cardiac Enzymes:    Recent Labs Lab 10/25/15 2146 10/26/15 0317  TROPONINI 0.04* 0.04*   BNP (last 3 results)  Recent Labs  08/12/15 2143 09/22/15 0833 10/25/15 1727  BNP 276.9* 568.9* 580.8*    ProBNP (last 3 results) No results for input(s): PROBNP in the last 8760 hours.  CBG:  Recent Labs Lab 10/28/15 1034 10/28/15 1626 10/28/15 2127 10/29/15 0628 10/29/15 1300  GLUCAP 215* 143* 213* 232* 134*    Recent Results (from the past 240 hour(s))  Culture, blood (routine x 2)     Status: None (Preliminary result)   Collection Time: 10/25/15  5:27 PM  Result Value Ref Range Status   Specimen Description BLOOD RIGHT WRIST  Final   Special Requests BOTTLES DRAWN AEROBIC AND ANAEROBIC 5CC  Final   Culture NO GROWTH 3 DAYS  Final   Report Status PENDING  Incomplete  Urine culture     Status: Abnormal   Collection Time: 10/25/15  9:05 PM  Result Value Ref Range Status   Specimen Description URINE, RANDOM  Final   Special Requests NONE  Final   Culture MULTIPLE SPECIES PRESENT, SUGGEST RECOLLECTION (A)  Final   Report Status 10/27/2015 FINAL  Final  MRSA PCR Screening     Status: None   Collection Time: 10/25/15 11:51 PM  Result Value Ref Range Status   MRSA by PCR NEGATIVE NEGATIVE Final    Comment:        The GeneXpert MRSA Assay (FDA approved for NASAL specimens only), is one component of a comprehensive MRSA colonization surveillance program. It is not intended to diagnose MRSA infection nor to guide or monitor treatment for MRSA infections.  Studies: No results found.  Scheduled Meds: . amiodarone  150 mg Intravenous Once  . atorvastatin  10 mg Oral q1800  . calcium acetate  1,334 mg Oral TID WC  . cinacalcet   60 mg Oral Q supper  . diltiazem  360 mg Oral Daily  . docusate sodium  100 mg Oral BID  . dorzolamide  1 drop Right Eye BID   And  . timolol  1 drop Right Eye BID  . doxercalciferol  2 mcg Intravenous Q M,W,F-HD  . feeding supplement (NEPRO CARB STEADY)  237 mL Oral BID BM  . ferric gluconate (FERRLECIT/NULECIT) IV  62.5 mg Intravenous Weekly  . insulin aspart  0-5 Units Subcutaneous QHS  . insulin aspart  0-9 Units Subcutaneous TID WC  . latanoprost  1 drop Right Eye QHS  . metoprolol succinate  50 mg Oral Daily  . prednisoLONE acetate  1 drop Left Eye BID  . sodium chloride  250 mL Intravenous Once  . sodium chloride flush  3 mL Intravenous Q12H  . sodium chloride flush  3 mL Intravenous Q12H  . tobramycin-dexamethasone  1 drop Left Eye QID  . Warfarin - Pharmacist Dosing Inpatient   Does not apply q1800    Continuous Infusions: . amiodarone     Followed by  . amiodarone       Time spent:  Hartley Barefoot A MD  [facility] Pager [phone] If 7PM-7AM, please contact night-coverage at [email or web address], password [facility] 10/29/2015, 1:15 PM  LOS: 1 day

## 2015-10-29 NOTE — Procedures (Signed)
Patient seen on Hemodialysis. QB 400, UF goal 1L .  Limited by heart rate.  Discussed with cardiology at bedside, will be starting amiodarone. May need extra rx to get vol off. Treatment adjusted as needed.  Bufford ButtnerElizabeth Giovanni Biby MD [facility]. Cell [phone] Pager # [phone] 11:08 AM

## 2015-10-30 DIAGNOSIS — I484 Atypical atrial flutter: Secondary | ICD-10-CM

## 2015-10-30 DIAGNOSIS — I959 Hypotension, unspecified: Secondary | ICD-10-CM

## 2015-10-30 LAB — CBC
HEMATOCRIT: 33.8 % — AB (ref 39.0–52.0)
Hemoglobin: 10.8 g/dL — ABNORMAL LOW (ref 13.0–17.0)
MCH: 30.5 pg (ref 26.0–34.0)
MCHC: 32 g/dL (ref 30.0–36.0)
MCV: 95.5 fL (ref 78.0–100.0)
PLATELETS: 158 10*3/uL (ref 150–400)
RBC: 3.54 MIL/uL — ABNORMAL LOW (ref 4.22–5.81)
RDW: 15.4 % (ref 11.5–15.5)
WBC: 6.3 10*3/uL (ref 4.0–10.5)

## 2015-10-30 LAB — CULTURE, BLOOD (ROUTINE X 2): Culture: NO GROWTH

## 2015-10-30 LAB — RENAL FUNCTION PANEL
Albumin: 3.1 g/dL — ABNORMAL LOW (ref 3.5–5.0)
Anion gap: 13 (ref 5–15)
BUN: 48 mg/dL — AB (ref 6–20)
CALCIUM: 8.8 mg/dL — AB (ref 8.9–10.3)
CO2: 26 mmol/L (ref 22–32)
CREATININE: 9.51 mg/dL — AB (ref 0.61–1.24)
Chloride: 90 mmol/L — ABNORMAL LOW (ref 101–111)
GFR calc Af Amer: 5 mL/min — ABNORMAL LOW (ref >60)
GFR calc non Af Amer: 5 mL/min — ABNORMAL LOW (ref >60)
GLUCOSE: 212 mg/dL — AB (ref 65–99)
Phosphorus: 5.7 mg/dL — ABNORMAL HIGH (ref 2.5–4.6)
Potassium: 4.1 mmol/L (ref 3.5–5.1)
SODIUM: 129 mmol/L — AB (ref 135–145)

## 2015-10-30 LAB — GLUCOSE, CAPILLARY
GLUCOSE-CAPILLARY: 226 mg/dL — AB (ref 65–99)
Glucose-Capillary: 134 mg/dL — ABNORMAL HIGH (ref 65–99)

## 2015-10-30 LAB — PROTIME-INR
INR: 2.58
Prothrombin Time: 28.2 seconds — ABNORMAL HIGH (ref 11.4–15.2)

## 2015-10-30 MED ORDER — WARFARIN SODIUM 4 MG PO TABS
4.0000 mg | ORAL_TABLET | Freq: Every day | ORAL | Status: DC
  Administered 2015-10-30 – 2015-10-31 (×2): 4 mg via ORAL
  Filled 2015-10-30 (×2): qty 1

## 2015-10-30 MED ORDER — AMIODARONE HCL 200 MG PO TABS
400.0000 mg | ORAL_TABLET | Freq: Two times a day (BID) | ORAL | Status: DC
  Administered 2015-10-30 – 2015-10-31 (×3): 400 mg via ORAL
  Filled 2015-10-30 (×3): qty 2

## 2015-10-30 NOTE — Progress Notes (Signed)
PROGRESS NOTE  [name] ZOX:[id number] DOB: 11/23/1933 DOA: 10/25/2015 PCP: Katy Apo, MD  HPI/Recap of past 24 hours: He was sent from HD to ED due to hypotension during dialysis   Subjective.  Feeling well, no complaints.   Assessment/Plan: Principal Problem:   Hypotension Active Problems:   Peripheral neuropathy (HCC)   Diabetes mellitus with ESRD (end-stage renal disease) (HCC)   Sinus bradycardia-tachycardia syndrome (HCC)   PAF (paroxysmal atrial fibrillation) (HCC)   Physical deconditioning   Congestive heart failure (HCC)   Essential hypertension   ESRD on dialysis (HCC)   Paroxysmal SVT (supraventricular tachycardia) (HCC)   Hypotension in the setting of hemodialysis. Possibly secondary to fluid shifts. Also recent nausea and vomiting. Blood pressure currently stabilized. No evidence for sepsis. Hold off on IV antibiotics for now. I have discussed with nephrology about possible adjusting dry weight target. Cortisol; at 12.  BP medications adjusted.  Limitation with HD due to hypotension and A fib.  Appreciate cardiology assistance, patient started on  Amiodarone.  Will see if patient is able to tolerates HD after adjustment of medications.    PAF (paroxysmal atrial fibrillation) (HCC) [place] vasc 2 score of 5.  Coumadin per pharmacy to dose.  Appreciate cardiology assistance, plan to load with amiodarone, and change metoprolol to Toprol.  Amiodarone oral and Cardizem.   Essential hypertension hold home medications given recent hypotension.  Sinus bradycardia-tachycardia syndrome; Physical deconditioning will need to discharge with PT OT follow-up.  Diabetes mellitus with ESRD (end-stage renal disease) (HCC) with a sliding scale  End-stage renal disease on HD MWF, nephrology input appreciated   DVT prophylaxis:  SCD    Code Status:    DNR/DNI as per patient   Family Communication:  wife 9-29   Disposition Plan:      Back to current facility when stable                                                  Social Work   Nutrition                              Consultants:  neprhology  Procedures:  HD  Antibiotics:  none   Objective: BP 134/89 (BP Location: Right Arm)   Pulse 92   Temp 98.2 F (36.8 C) (Oral)   Resp 18   Ht 6\' 5"  (1.956 m)   Wt 105.7 kg (233 lb 0.4 oz)   SpO2 94%   BMI 27.63 kg/m   Intake/Output Summary (Last 24 hours) at 10/30/15 1312 Last data filed at 10/30/15 8119  Gross per 24 hour  Intake            353.6 ml  Output                0 ml  Net            353.6 ml   Filed Weights   10/27/15 1650 10/29/15 0730 10/30/15 0412  Weight: 102.7 kg (226 lb 6.6 oz) 104 kg (229 lb 4.5 oz) 105.7 kg (233 lb 0.4 oz)    Exam:   General:  Chronically ill, NAD, left eye blind ( chronic)  Cardiovascular: IRRR  Respiratory: CTABL  Abdomen: Soft/ND/NT, positive BS  Musculoskeletal: No Edema  Neuro: aaox3  Data Reviewed:  Basic Metabolic Panel:  Recent Labs Lab 10/25/15 1727 10/26/15 0317 10/26/15 1838 10/27/15 0350 10/29/15 0929  NA 137 137 139 137 129*  K 4.0 4.0 4.0 4.5 4.3  CL 97* 97* 96* 99* 91*  CO2 23 27 27 24 22   GLUCOSE 164* 217* 173* 136* 200*  BUN 49* 56* 65* 72* 70*  CREATININE 9.16* 9.97* 11.83* 12.54* 12.44*  CALCIUM 8.8* 8.6* 9.1 9.2 8.9  MG  --  2.4  --   --   --   PHOS  --  5.8* 6.1*  --  7.1*   Liver Function Tests:  Recent Labs Lab 10/25/15 1727 10/26/15 0317 10/26/15 1838 10/29/15 0929  AST 20 15  --   --   ALT 22 19  --   --   ALKPHOS 56 52  --   --   BILITOT 1.0 0.8  --   --   PROT 7.4 6.5  --   --   ALBUMIN 3.2* 2.9* 3.1* 2.9*   No results for input(s): LIPASE, AMYLASE in the last 168 hours. No results for input(s): AMMONIA in the last 168 hours. CBC:  Recent Labs Lab 10/25/15 1727 10/26/15 0317 10/26/15 1838 10/27/15 0350 10/29/15 0927 10/30/15 1222  WBC 6.2 5.4 5.8 5.6 6.1 6.3  NEUTROABS 3.5  --   --   --    --   --   HGB 13.0 10.8* 11.2* 10.6* 10.6* 10.8*  HCT 39.6 33.2* 34.7* 33.4* 31.9* 33.8*  MCV 93.8 94.9 95.3 95.4 92.7 95.5  PLT 167 143* 143* 147* 147* 158   Cardiac Enzymes:    Recent Labs Lab 10/25/15 2146 10/26/15 0317  TROPONINI 0.04* 0.04*   BNP (last 3 results)  Recent Labs  08/12/15 2143 09/22/15 0833 10/25/15 1727  BNP 276.9* 568.9* 580.8*    ProBNP (last 3 results) No results for input(s): PROBNP in the last 8760 hours.  CBG:  Recent Labs Lab 10/29/15 0628 10/29/15 1300 10/29/15 1618 10/29/15 2132 10/30/15 1107  GLUCAP 232* 134* 246* 198* 134*    Recent Results (from the past 240 hour(s))  Culture, blood (routine x 2)     Status: None (Preliminary result)   Collection Time: 10/25/15  5:27 PM  Result Value Ref Range Status   Specimen Description BLOOD RIGHT WRIST  Final   Special Requests BOTTLES DRAWN AEROBIC AND ANAEROBIC 5CC  Final   Culture NO GROWTH 4 DAYS  Final   Report Status PENDING  Incomplete  Urine culture     Status: Abnormal   Collection Time: 10/25/15  9:05 PM  Result Value Ref Range Status   Specimen Description URINE, RANDOM  Final   Special Requests NONE  Final   Culture MULTIPLE SPECIES PRESENT, SUGGEST RECOLLECTION (A)  Final   Report Status 10/27/2015 FINAL  Final  MRSA PCR Screening     Status: None   Collection Time: 10/25/15 11:51 PM  Result Value Ref Range Status   MRSA by PCR NEGATIVE NEGATIVE Final    Comment:        The GeneXpert MRSA Assay (FDA approved for NASAL specimens only), is one component of a comprehensive MRSA colonization surveillance program. It is not intended to diagnose MRSA infection nor to guide or monitor treatment for MRSA infections.      Studies: No results found.  Scheduled Meds: . amiodarone  400 mg Oral BID  . atorvastatin  10 mg Oral q1800  . calcium acetate  1,334 mg Oral TID WC  .  cinacalcet  60 mg Oral Q supper  . diltiazem  360 mg Oral Daily  . docusate sodium  100 mg  Oral BID  . dorzolamide  1 drop Right Eye BID   And  . timolol  1 drop Right Eye BID  . doxercalciferol  2 mcg Intravenous Q M,W,F-HD  . feeding supplement (NEPRO CARB STEADY)  237 mL Oral BID BM  . ferric gluconate (FERRLECIT/NULECIT) IV  62.5 mg Intravenous Weekly  . insulin aspart  0-5 Units Subcutaneous QHS  . insulin aspart  0-9 Units Subcutaneous TID WC  . latanoprost  1 drop Right Eye QHS  . metoprolol succinate  50 mg Oral Daily  . prednisoLONE acetate  1 drop Left Eye BID  . sodium chloride  250 mL Intravenous Once  . sodium chloride flush  3 mL Intravenous Q12H  . sodium chloride flush  3 mL Intravenous Q12H  . tobramycin-dexamethasone  1 drop Left Eye QID  . warfarin  4 mg Oral q1800  . Warfarin - Pharmacist Dosing Inpatient   Does not apply q1800    Continuous Infusions:     Time spent: 35mins  Hartley Barefootegalado, Summers Buendia A MD  [facility] Pager [phone] If 7PM-7AM, please contact night-coverage at [email or web address], password [email or web address] 10/30/2015, 1:12 PM  LOS: 2 days

## 2015-10-30 NOTE — Progress Notes (Signed)
ANTICOAGULATION CONSULT NOTE - Follow Up Consult  Pharmacy Consult for Warfarin Indication: atrial fibrillation  Allergies  Allergen Reactions  . Diltiazem Other (See Comments)    SEVERE STOMACH CRAMPING - Takes extended release form without problems    Patient Measurements: Height: 6\' 5"  (195.6 cm) Weight: 233 lb 0.4 oz (105.7 kg) IBW/kg (Calculated) : 89.1  Vital Signs: Temp: 98.2 F (36.8 C) (09/30 0412) Temp Source: Oral (09/30 0412) BP: 134/89 (09/30 0412) Pulse Rate: 92 (09/30 0412)  Labs:  Recent Labs  10/28/15 1042 10/29/15 0927 10/29/15 0929 10/30/15 0420  HGB  --  10.6*  --   --   HCT  --  31.9*  --   --   PLT  --  147*  --   --   LABPROT 22.9*  --   --  28.2*  INR 1.99  --   --  2.58  CREATININE  --   --  12.44*  --    Estimated Creatinine Clearance: 6 mL/min (by C-G formula based on SCr of 12.44 mg/dL (H)).  Assessment: 82 y.o.maleadmittedon 9/25/2017with hypotension. Initially treated empirically for sepsis, now thought to be caused by dehydration. Pharmacy has been consulted for warfarin dosing. Patient's INR 4.04 on admission and warfarin held  INR = 2.58 Amiodarone started this admission  Goal of Therapy:  INR 2-3 Monitor platelets by anticoagulation protocol: Yes   Plan:  Warfarin 4 mg po daily  Daily INR  Thank you Okey RegalLisa Gemini Beaumier, PharmD [phone] 10/30/2015 11:31 AM

## 2015-10-30 NOTE — Procedures (Signed)
I was present at this session.  I have reviewed the session itself and made appropriate changes.  HD via LUA access. ACCess press ok. bp tol vol removal.  Adrie Picking L 9/30/20171:50 PM

## 2015-10-30 NOTE — Progress Notes (Signed)
[facility] Progress Note   Assessment/ Plan:    Dialyzes at [facility]  EDW 97.5. HD 4 hrs Bath 2K / 2Ca, Dialyzer F180, Heparin 4200 bolus Access LUE AVF Hectorol 2 mcg q treatment Mircera 50 q 4 weeks (given 9/13) Venofer 50 mg q weekly   1 Afib with RVR./ hypotension: dialysis had to be terminated 9/27 after 2 hrs due to tachycardia to 150s and hypotension.  On Metop which is dialyzed off during treatment; cardizem restarted, plan for starting amio per cardiology 2 ESRD: MWF.  plan HD 9/29, Na 129 today which is likely due to volume.  2K bath, K 4.3 3 Volume: EDW raised from 97--> 97.5 as an outpatient; becoming more vol up due to inability to UF .  Will have to have an extra treatment after amio started.  Plan for 9/30 with aggressive UF. 4. Anemia of ESRD: Mircera started on 9/13, not due until 10/13.  Hgb 10.6 on 9/29. On weekly Fe 5. Metabolic Bone Disease: PTH 240, Phos 6.5, corrected Ca 9.9.  On hectorol and Phoslo 2 tabs TID with meals in addition to sensipar 60 mg daily.  phos 7.1, switching to renal diet with fluid restriction.   6.  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  Does not appear to be at imminent risk of rupture, VVS consulted and pt does not desire surgery for this ever and he reiterated this to me as well. Cannulating away from thinned sites. 7.  Nutrition: nepro supps, albumin 2.9. 8.  Coughing: ? Microaspiration.  Consider SLP eval.  Subjective:    Coughing intermittently.   Objective:   BP (!) (P) 153/105   Pulse (!) (P) 103   Temp 98.2 F (36.8 C) (Oral)   Resp 18   Ht 6\' 5"  (1.956 m)   Wt 105.7 kg (233 lb 0.4 oz)   SpO2 94%   BMI 27.63 kg/m   Physical Exam: GEN:NAD, coughing intermittently HEENT: blind, MMM, edentulous NECK: no JVD PULM: some coarse breath sounds with expiratory wheezing throughout CV :  Tachy, irregular, soft systolic murmur ABD: obese, nontender, nondistended, NABS EXT: 1+ LE edema LUE AVF with aneuysmal  areas with hypopigmented.  No frank ulcers. NEURO: AAO x 3  Labs: [employer]  Recent Labs Lab 10/25/15 1727 10/26/15 0317 10/26/15 1838 10/27/15 0350 10/29/15 0929 10/30/15 1222  NA 137 137 139 137 129* 129*  K 4.0 4.0 4.0 4.5 4.3 4.1  CL 97* 97* 96* 99* 91* 90*  CO2 23 27 27 24 22 26   GLUCOSE 164* 217* 173* 136* 200* 212*  BUN 49* 56* 65* 72* 70* 48*  CREATININE 9.16* 9.97* 11.83* 12.54* 12.44* 9.51*  CALCIUM 8.8* 8.6* 9.1 9.2 8.9 8.8*  PHOS  --  5.8* 6.1*  --  7.1* 5.7*   CBC  Recent Labs Lab 10/25/15 1727  10/26/15 1838 10/27/15 0350 10/29/15 0927 10/30/15 1222  WBC 6.2  < > 5.8 5.6 6.1 6.3  NEUTROABS 3.5  --   --   --   --   --   HGB 13.0  < > 11.2* 10.6* 10.6* 10.8*  HCT 39.6  < > 34.7* 33.4* 31.9* 33.8*  MCV 93.8  < > 95.3 95.4 92.7 95.5  PLT 167  < > 143* 147* 147* 158  < > = values in this interval not displayed.  @IMGRELPRIORS @ Medications:    . amiodarone  400 mg Oral BID  . atorvastatin  10 mg Oral q1800  . calcium  acetate  1,334 mg Oral TID WC  . cinacalcet  60 mg Oral Q supper  . diltiazem  360 mg Oral Daily  . docusate sodium  100 mg Oral BID  . dorzolamide  1 drop Right Eye BID   And  . timolol  1 drop Right Eye BID  . doxercalciferol  2 mcg Intravenous Q M,W,F-HD  . feeding supplement (NEPRO CARB STEADY)  237 mL Oral BID BM  . ferric gluconate (FERRLECIT/NULECIT) IV  62.5 mg Intravenous Weekly  . insulin aspart  0-5 Units Subcutaneous QHS  . insulin aspart  0-9 Units Subcutaneous TID WC  . latanoprost  1 drop Right Eye QHS  . metoprolol succinate  50 mg Oral Daily  . prednisoLONE acetate  1 drop Left Eye BID  . sodium chloride  250 mL Intravenous Once  . sodium chloride flush  3 mL Intravenous Q12H  . sodium chloride flush  3 mL Intravenous Q12H  . tobramycin-dexamethasone  1 drop Left Eye QID  . warfarin  4 mg Oral q1800  . Warfarin - Pharmacist Dosing Inpatient   Does not apply q1800     Bufford ButtnerElizabeth Dallis Darden, MD [facility] Cell [phone] pgr [phone] 10/30/2015, 2:18 PM

## 2015-10-30 NOTE — Progress Notes (Signed)
Primary cardiologist: Dr. Verdis PrimeHenry [name]  Seen for followup: Atrial fibrillation/flutter  Subjective:    Generally weak. No chest pain or palpitations.  Objective:   Temp:  [97.3 F (36.3 C)-98.2 F (36.8 C)] 98.2 F (36.8 C) (09/30 0412) Pulse Rate:  [64-130] 92 (09/30 0412) Resp:  [15-19] 18 (09/30 0412) BP: (109-136)/(64-89) 134/89 (09/30 0412) SpO2:  [94 %-100 %] 94 % (09/30 0412) Weight:  [233 lb 0.4 oz (105.7 kg)] 233 lb 0.4 oz (105.7 kg) (09/30 0412) Last BM Date: 10/29/15  Filed Weights   10/27/15 1650 10/29/15 0730 10/30/15 0412  Weight: 226 lb 6.6 oz (102.7 kg) 229 lb 4.5 oz (104 kg) 233 lb 0.4 oz (105.7 kg)    Intake/Output Summary (Last 24 hours) at 10/30/15 1016 Last data filed at 10/30/15 0639  Gross per 24 hour  Intake            353.6 ml  Output               15 ml  Net            338.6 ml    Telemetry: Probable atypical atrial flutter.  Exam:  General: Elderly male in no distress.  Lungs: Clear, nonlabored.  Cardiac: Irregularly irregular, no gallop.  Extremities: No pitting edema.   Lab Results:  Basic Metabolic Panel:  Recent Labs Lab 10/26/15 0317 10/26/15 1838 10/27/15 0350 10/29/15 0929  NA 137 139 137 129*  K 4.0 4.0 4.5 4.3  CL 97* 96* 99* 91*  CO2 27 27 24 22   GLUCOSE 217* 173* 136* 200*  BUN 56* 65* 72* 70*  CREATININE 9.97* 11.83* 12.54* 12.44*  CALCIUM 8.6* 9.1 9.2 8.9  MG 2.4  --   --   --     Liver Function Tests:  Recent Labs Lab 10/25/15 1727 10/26/15 0317 10/26/15 1838 10/29/15 0929  AST 20 15  --   --   ALT 22 19  --   --   ALKPHOS 56 52  --   --   BILITOT 1.0 0.8  --   --   PROT 7.4 6.5  --   --   ALBUMIN 3.2* 2.9* 3.1* 2.9*    CBC:  Recent Labs Lab 10/26/15 1838 10/27/15 0350 10/29/15 0927  WBC 5.8 5.6 6.1  HGB 11.2* 10.6* 10.6*  HCT 34.7* 33.4* 31.9*  MCV 95.3 95.4 92.7  PLT 143* 147* 147*    Cardiac Enzymes:  Recent Labs Lab 10/25/15 2146 10/26/15 0317  TROPONINI 0.04* 0.04*      Coagulation:  Recent Labs Lab 10/27/15 0350 10/28/15 1042 10/30/15 0420  INR 2.90 1.99 2.58   Echocardiogram 08/14/2015: Study Conclusions  - Left ventricle: The cavity size was normal. Wall thickness was   increased in a pattern of mild LVH. Systolic function was normal.   The estimated ejection fraction was in the range of 60% to 65%.   Indeterminant diastolic function (atrial fibrillation). Wall   motion was normal; there were no regional wall motion   abnormalities. - Aortic valve: Trileaflet; moderately calcified leaflets.   Transvalvular velocity was minimally increased. There was mild   stenosis. There was trivial regurgitation. Mean gradient (S): 11   mm Hg. Valve area (VTI): 1.51 cm^2. Valve area (Vmax): 1.57 cm^2.   Valve area (Vmean): 1.54 cm^2. - Mitral valve: Mildly calcified annulus. There was trivial   regurgitation. - Left atrium: The atrium was moderately dilated. - Right ventricle: The cavity size was normal. Systolic  function   was normal. - Right atrium: The atrium was moderately dilated. - Pulmonary arteries: No complete TR doppler jet so unable to   estimate PA systolic pressure. - Inferior vena cava: The vessel was normal in size. The   respirophasic diameter changes were in the normal range (>= 50%),   consistent with normal central venous pressure.  Impressions:  - Normal LV size with mild LV hypertrophy. EF 60-65%. Normal RV   size and systolic function. Mild aortic stenosis. Moderate   biatrial enlargement.  Medications:   Scheduled Medications: . atorvastatin  10 mg Oral q1800  . calcium acetate  1,334 mg Oral TID WC  . cinacalcet  60 mg Oral Q supper  . diltiazem  360 mg Oral Daily  . docusate sodium  100 mg Oral BID  . dorzolamide  1 drop Right Eye BID   And  . timolol  1 drop Right Eye BID  . doxercalciferol  2 mcg Intravenous Q M,W,F-HD  . feeding supplement (NEPRO CARB STEADY)  237 mL Oral BID BM  . ferric gluconate  (FERRLECIT/NULECIT) IV  62.5 mg Intravenous Weekly  . insulin aspart  0-5 Units Subcutaneous QHS  . insulin aspart  0-9 Units Subcutaneous TID WC  . latanoprost  1 drop Right Eye QHS  . metoprolol succinate  50 mg Oral Daily  . prednisoLONE acetate  1 drop Left Eye BID  . sodium chloride  250 mL Intravenous Once  . sodium chloride flush  3 mL Intravenous Q12H  . sodium chloride flush  3 mL Intravenous Q12H  . tobramycin-dexamethasone  1 drop Left Eye QID  . Warfarin - Pharmacist Dosing Inpatient   Does not apply q1800     Infusions: . amiodarone 30 mg/hr (10/30/15 0059)     PRN Medications:  sodium chloride, sodium chloride, sodium chloride, acetaminophen **OR** acetaminophen, alteplase, guaiFENesin, heparin, heparin, lidocaine (PF), lidocaine-prilocaine, ondansetron **OR** ondansetron (ZOFRAN) IV, pentafluoroprop-tetrafluoroeth, sodium chloride flush   Assessment:   1. PAF and recent atypical atrial flutter with RVR. He is on Coumadin per pharmacy. Recently placed on IV amiodarone load to assist with rhythm control. Also continues on beta blocker and calcium channel blocker.  2. ESRD on hemodialysis, sessions have been limited by hypotension in the setting of atrial arrhythmias.  3. History of tachycardia-bradycardia syndrome.   Plan/Discussion:    Will transition from IV amiodarone to oral load 400 mg twice daily. Follow-up ECG and telemetry monitoring. He does have a history of tachycardia-bradycardia syndrome, so need to keep this in mind if he converts back to sinus rhythm on amiodarone, beta blocker, and calcium channel blocker.   [name] [name], M.D., F.A.C.C.

## 2015-10-31 LAB — CBC
HEMATOCRIT: 33.8 % — AB (ref 39.0–52.0)
HEMOGLOBIN: 10.9 g/dL — AB (ref 13.0–17.0)
MCH: 30.8 pg (ref 26.0–34.0)
MCHC: 32.2 g/dL (ref 30.0–36.0)
MCV: 95.5 fL (ref 78.0–100.0)
Platelets: 155 10*3/uL (ref 150–400)
RBC: 3.54 MIL/uL — ABNORMAL LOW (ref 4.22–5.81)
RDW: 15.4 % (ref 11.5–15.5)
WBC: 5.8 10*3/uL (ref 4.0–10.5)

## 2015-10-31 LAB — RENAL FUNCTION PANEL
ALBUMIN: 3.1 g/dL — AB (ref 3.5–5.0)
ANION GAP: 13 (ref 5–15)
BUN: 39 mg/dL — ABNORMAL HIGH (ref 6–20)
CALCIUM: 8.8 mg/dL — AB (ref 8.9–10.3)
CO2: 25 mmol/L (ref 22–32)
Chloride: 93 mmol/L — ABNORMAL LOW (ref 101–111)
Creatinine, Ser: 8.38 mg/dL — ABNORMAL HIGH (ref 0.61–1.24)
GFR, EST AFRICAN AMERICAN: 6 mL/min — AB (ref >60)
GFR, EST NON AFRICAN AMERICAN: 5 mL/min — AB (ref >60)
Glucose, Bld: 215 mg/dL — ABNORMAL HIGH (ref 65–99)
PHOSPHORUS: 5.2 mg/dL — AB (ref 2.5–4.6)
POTASSIUM: 4 mmol/L (ref 3.5–5.1)
SODIUM: 131 mmol/L — AB (ref 135–145)

## 2015-10-31 LAB — PROTIME-INR
INR: 2.75
Prothrombin Time: 29.6 seconds — ABNORMAL HIGH (ref 11.4–15.2)

## 2015-10-31 LAB — GLUCOSE, CAPILLARY
GLUCOSE-CAPILLARY: 133 mg/dL — AB (ref 65–99)
GLUCOSE-CAPILLARY: 195 mg/dL — AB (ref 65–99)
GLUCOSE-CAPILLARY: 264 mg/dL — AB (ref 65–99)
Glucose-Capillary: 120 mg/dL — ABNORMAL HIGH (ref 65–99)

## 2015-10-31 MED ORDER — PENTAFLUOROPROP-TETRAFLUOROETH EX AERO: 1.0000 "application " | INHALATION_SPRAY | CUTANEOUS | Status: DC | PRN

## 2015-10-31 MED ORDER — SODIUM CHLORIDE 0.9 % IV SOLN: 100.0000 mL | INTRAVENOUS | Status: DC | PRN

## 2015-10-31 MED ORDER — SODIUM CHLORIDE 0.9 % IV SOLN
100.0000 mL | INTRAVENOUS | Status: DC | PRN
Start: 2015-10-31 — End: 2015-11-01

## 2015-10-31 MED ORDER — LIDOCAINE HCL (PF) 1 % IJ SOLN: 5.0000 mL | INTRAMUSCULAR | Status: DC | PRN

## 2015-10-31 MED ORDER — LIDOCAINE-PRILOCAINE 2.5-2.5 % EX CREA: 1.0000 "application " | TOPICAL_CREAM | CUTANEOUS | Status: DC | PRN

## 2015-10-31 MED ORDER — ALTEPLASE 2 MG IJ SOLR: 2.0000 mg | Freq: Once | INTRAMUSCULAR | Status: DC | PRN

## 2015-10-31 MED ORDER — HEPARIN SODIUM (PORCINE) 1000 UNIT/ML DIALYSIS: 1000.0000 [IU] | INTRAMUSCULAR | Status: DC | PRN

## 2015-10-31 NOTE — Progress Notes (Signed)
ANTICOAGULATION CONSULT NOTE - Follow Up Consult  Pharmacy Consult for Warfarin Indication: atrial fibrillation  Allergies  Allergen Reactions  . Diltiazem Other (See Comments)    SEVERE STOMACH CRAMPING - Takes extended release form without problems    Patient Measurements: Height: 6\' 5"  (195.6 cm) Weight: 230 lb 6.1 oz (104.5 kg) IBW/kg (Calculated) : 89.1  Vital Signs: Temp: 98.2 F (36.8 C) (10/01 0522) Temp Source: Oral (10/01 0522) BP: 112/82 (10/01 0522) Pulse Rate: 63 (10/01 0522)  Labs:  Recent Labs  10/29/15 0927 10/29/15 0929 10/30/15 0420 10/30/15 1222 10/31/15 0146  HGB 10.6*  --   --  10.8*  --   HCT 31.9*  --   --  33.8*  --   PLT 147*  --   --  158  --   LABPROT  --   --  28.2*  --  29.6*  INR  --   --  2.58  --  2.75  CREATININE  --  12.44*  --  9.51*  --    Estimated Creatinine Clearance: 7.8 mL/min (by C-G formula based on SCr of 9.51 mg/dL (H)).  Assessment: 82 y.o.maleadmittedon 9/25/2017with hypotension. Initially treated empirically for sepsis, now thought to be caused by dehydration. Pharmacy has been consulted for warfarin dosing. Patient's INR 4.04 on admission and warfarin held  INR = 2.75 (slight trend up from 7.5 and 6 mg doses) Amiodarone started this admission  Goal of Therapy:  INR 2-3 Monitor platelets by anticoagulation protocol: Yes   Plan:  Warfarin 4 mg po daily  Daily INR  Thank you Okey RegalLisa Shawniece Oyola, PharmD [phone] 10/31/2015 12:09 PM

## 2015-10-31 NOTE — Progress Notes (Signed)
PROGRESS NOTE  [name] [name] ZOX:[id number] DOB: August 14, 1933 DOA: 10/25/2015 PCP: Katy ApoPOLITE,RONALD D, MD  HPI/Recap of past 24 hours: He was sent from HD to ED due to hypotension during dialysis   Subjective.  Feeling well, no complaints.   Assessment/Plan: Principal Problem:   Hypotension Active Problems:   Peripheral neuropathy (HCC)   Diabetes mellitus with ESRD (end-stage renal disease) (HCC)   Sinus bradycardia-tachycardia syndrome (HCC)   PAF (paroxysmal atrial fibrillation) (HCC)   Physical deconditioning   Congestive heart failure (HCC)   Essential hypertension   ESRD on dialysis (HCC)   Paroxysmal SVT (supraventricular tachycardia) (HCC)   Hypotension in the setting of hemodialysis. Possibly secondary to fluid shifts. Also recent nausea and vomiting. Blood pressure currently stabilized. No evidence for sepsis. Hold off on IV antibiotics for now. I have discussed with nephrology about possible adjusting dry weight target. Cortisol; at 12.  BP medications adjusted.  Limitation with HD due to hypotension and A fib.  Appreciate cardiology assistance, patient started on  Amiodarone.  Tolerated HD better yesterday. Plan to repeat HD tomorrow.  Discharge when ok by nephrology    PAF (paroxysmal atrial fibrillation) (HCC) [name] 5.  Coumadin per pharmacy to dose.  Appreciate cardiology assistance, plan to load with amiodarone, and change metoprolol to Toprol.  Amiodarone oral and Cardizem.   Essential hypertension hold home medications given recent hypotension.  Sinus bradycardia-tachycardia syndrome; Physical deconditioning will need to discharge with PT OT follow-up.  Diabetes mellitus with ESRD (end-stage renal disease) (HCC) with a sliding scale  End-stage renal disease on HD MWF, nephrology input appreciated   DVT prophylaxis:  SCD    Code Status:    DNR/DNI as per patient   Family Communication:  wife 10-01   Disposition Plan:                       Back to current facility when ok by nephrology                                                 Social Work   Nutrition                              Consultants:  neprhology  Procedures:  HD  Antibiotics:  none   Objective: BP 140/76 (BP Location: Right Arm)   Pulse 65   Temp 98.6 F (37 C) (Oral)   Resp 18   Ht 6\' 5"  (1.956 m)   Wt 104.5 kg (230 lb 6.1 oz)   SpO2 97%   BMI 27.32 kg/m   Intake/Output Summary (Last 24 hours) at 10/31/15 1451 Last data filed at 10/31/15 1345  Gross per 24 hour  Intake              600 ml  Output             2500 ml  Net            -1900 ml   Filed Weights   10/30/15 1305 10/30/15 1717 10/31/15 0522  Weight: 104.3 kg (229 lb 15 oz) 102 kg (224 lb 13.9 oz) 104.5 kg (230 lb 6.1 oz)    Exam:   General:  Chronically ill, NAD, left eye blind ( chronic)  Cardiovascular:  IRRR  Respiratory: CTABL  Abdomen: Soft/ND/NT, positive BS  Musculoskeletal: No Edema  Neuro: aaox3  Data Reviewed: Basic Metabolic Panel:  Recent Labs Lab 10/26/15 0317 10/26/15 1838 10/27/15 0350 10/29/15 0929 10/30/15 1222 10/31/15 1211  NA 137 139 137 129* 129* 131*  K 4.0 4.0 4.5 4.3 4.1 4.0  CL 97* 96* 99* 91* 90* 93*  CO2 27 27 24 22 26 25   GLUCOSE 217* 173* 136* 200* 212* 215*  BUN 56* 65* 72* 70* 48* 39*  CREATININE 9.97* 11.83* 12.54* 12.44* 9.51* 8.38*  CALCIUM 8.6* 9.1 9.2 8.9 8.8* 8.8*  MG 2.4  --   --   --   --   --   PHOS 5.8* 6.1*  --  7.1* 5.7* 5.2*   Liver Function Tests:  Recent Labs Lab 10/25/15 1727 10/26/15 0317 10/26/15 1838 10/29/15 0929 10/30/15 1222 10/31/15 1211  AST 20 15  --   --   --   --   ALT 22 19  --   --   --   --   ALKPHOS 56 52  --   --   --   --   BILITOT 1.0 0.8  --   --   --   --   PROT 7.4 6.5  --   --   --   --   ALBUMIN 3.2* 2.9* 3.1* 2.9* 3.1* 3.1*   No results for input(s): LIPASE, AMYLASE in the last 168 hours. No results for input(s): AMMONIA in the last 168  hours. CBC:  Recent Labs Lab 10/25/15 1727  10/26/15 1838 10/27/15 0350 10/29/15 0927 10/30/15 1222 10/31/15 1211  WBC 6.2  < > 5.8 5.6 6.1 6.3 5.8  NEUTROABS 3.5  --   --   --   --   --   --   HGB 13.0  < > 11.2* 10.6* 10.6* 10.8* 10.9*  HCT 39.6  < > 34.7* 33.4* 31.9* 33.8* 33.8*  MCV 93.8  < > 95.3 95.4 92.7 95.5 95.5  PLT 167  < > 143* 147* 147* 158 155  < > = values in this interval not displayed. Cardiac Enzymes:    Recent Labs Lab 10/25/15 2146 10/26/15 0317  TROPONINI 0.04* 0.04*   BNP (last 3 results)  Recent Labs  08/12/15 2143 09/22/15 0833 10/25/15 1727  BNP 276.9* 568.9* 580.8*    ProBNP (last 3 results) No results for input(s): PROBNP in the last 8760 hours.  CBG:  Recent Labs Lab 10/29/15 2132 10/30/15 1107 10/30/15 2059 10/31/15 0621 10/31/15 1153  GLUCAP 198* 134* 226* 133* 195*    Recent Results (from the past 240 hour(s))  Culture, blood (routine x 2)     Status: None   Collection Time: 10/25/15  5:27 PM  Result Value Ref Range Status   Specimen Description BLOOD RIGHT WRIST  Final   Special Requests BOTTLES DRAWN AEROBIC AND ANAEROBIC 5CC  Final   Culture NO GROWTH 5 DAYS  Final   Report Status 10/30/2015 FINAL  Final  Urine culture     Status: Abnormal   Collection Time: 10/25/15  9:05 PM  Result Value Ref Range Status   Specimen Description URINE, RANDOM  Final   Special Requests NONE  Final   Culture MULTIPLE SPECIES PRESENT, SUGGEST RECOLLECTION (A)  Final   Report Status 10/27/2015 FINAL  Final  MRSA PCR Screening     Status: None   Collection Time: 10/25/15 11:51 PM  Result Value Ref Range Status  MRSA by PCR NEGATIVE NEGATIVE Final    Comment:        The GeneXpert MRSA Assay (FDA approved for NASAL specimens only), is one component of a comprehensive MRSA colonization surveillance program. It is not intended to diagnose MRSA infection nor to guide or monitor treatment for MRSA infections.      Studies: No  results found.  Scheduled Meds: . amiodarone  400 mg Oral BID  . atorvastatin  10 mg Oral q1800  . calcium acetate  1,334 mg Oral TID WC  . cinacalcet  60 mg Oral Q supper  . diltiazem  360 mg Oral Daily  . docusate sodium  100 mg Oral BID  . dorzolamide  1 drop Right Eye BID   And  . timolol  1 drop Right Eye BID  . doxercalciferol  2 mcg Intravenous Q M,W,F-HD  . feeding supplement (NEPRO CARB STEADY)  237 mL Oral BID BM  . ferric gluconate (FERRLECIT/NULECIT) IV  62.5 mg Intravenous Weekly  . insulin aspart  0-5 Units Subcutaneous QHS  . insulin aspart  0-9 Units Subcutaneous TID WC  . latanoprost  1 drop Right Eye QHS  . metoprolol succinate  50 mg Oral Daily  . prednisoLONE acetate  1 drop Left Eye BID  . sodium chloride  250 mL Intravenous Once  . sodium chloride flush  3 mL Intravenous Q12H  . sodium chloride flush  3 mL Intravenous Q12H  . tobramycin-dexamethasone  1 drop Left Eye QID  . warfarin  4 mg Oral q1800  . Warfarin - Pharmacist Dosing Inpatient   Does not apply q1800    Continuous Infusions:     Time spent:  Hartley Barefoot A MD  [facility] Pager [phone] If 7PM-7AM, please contact night-coverage at [email or web address], password [email or web address] 10/31/2015, 2:51 PM  LOS: 3 days

## 2015-10-31 NOTE — Progress Notes (Signed)
Primary cardiologist: Dr. Verdis Prime  Seen for followup: Atrial fibrillation/flutter  Subjective:    Coughing improved. Was able to tolerate dialysis better yesterday. No palpitations or chest pain.  Objective:   Temp:  [97.8 F (36.6 C)-98.2 F (36.8 C)] 98.2 F (36.8 C) (10/01 0522) Pulse Rate:  [63-109] 63 (10/01 0522) Resp:  [20-25] 20 (09/30 1900) BP: (112-153)/(74-105) 112/82 (10/01 0522) SpO2:  [98 %-99 %] 98 % (10/01 0522) Weight:  [224 lb 13.9 oz (102 kg)-230 lb 6.1 oz (104.5 kg)] 230 lb 6.1 oz (104.5 kg) (10/01 0522) Last BM Date: 10/29/15  Filed Weights   10/30/15 1305 10/30/15 1717 10/31/15 0522  Weight: 229 lb 15 oz (104.3 kg) 224 lb 13.9 oz (102 kg) 230 lb 6.1 oz (104.5 kg)    Intake/Output Summary (Last 24 hours) at 10/31/15 1052 Last data filed at 10/31/15 0820  Gross per 24 hour  Intake              240 ml  Output             2500 ml  Net            -2260 ml    Telemetry: Rate-controlled atrial fibrillation.  Exam:  General: Elderly male in no distress.  Lungs: Clear, nonlabored.  Cardiac: Irregularly irregular, no gallop.  Extremities: No pitting edema.   Lab Results:  Basic Metabolic Panel:  Recent Labs Lab 10/26/15 0317  10/27/15 0350 10/29/15 0929 10/30/15 1222  NA 137  < > 137 129* 129*  K 4.0  < > 4.5 4.3 4.1  CL 97*  < > 99* 91* 90*  CO2 27  < > 24 22 26   GLUCOSE 217*  < > 136* 200* 212*  BUN 56*  < > 72* 70* 48*  CREATININE 9.97*  < > 12.54* 12.44* 9.51*  CALCIUM 8.6*  < > 9.2 8.9 8.8*  MG 2.4  --   --   --   --   < > = values in this interval not displayed.   CBC:  Recent Labs Lab 10/27/15 0350 10/29/15 0927 10/30/15 1222  WBC 5.6 6.1 6.3  HGB 10.6* 10.6* 10.8*  HCT 33.4* 31.9* 33.8*  MCV 95.4 92.7 95.5  PLT 147* 147* 158    Cardiac Enzymes:  Recent Labs Lab 10/25/15 2146 10/26/15 0317  TROPONINI 0.04* 0.04*    Coagulation:  Recent Labs Lab 10/28/15 1042 10/30/15 0420 10/31/15 0146  INR  1.99 2.58 2.75   Echocardiogram 08/14/2015: Study Conclusions  - Left ventricle: The cavity size was normal. Wall thickness was   increased in a pattern of mild LVH. Systolic function was normal.   The estimated ejection fraction was in the range of 60% to 65%.   Indeterminant diastolic function (atrial fibrillation). Wall   motion was normal; there were no regional wall motion   abnormalities. - Aortic valve: Trileaflet; moderately calcified leaflets.   Transvalvular velocity was minimally increased. There was mild   stenosis. There was trivial regurgitation. Mean gradient (S): 11   mm Hg. Valve area (VTI): 1.51 cm^2. Valve area (Vmax): 1.57 cm^2.   Valve area (Vmean): 1.54 cm^2. - Mitral valve: Mildly calcified annulus. There was trivial   regurgitation. - Left atrium: The atrium was moderately dilated. - Right ventricle: The cavity size was normal. Systolic function   was normal. - Right atrium: The atrium was moderately dilated. - Pulmonary arteries: No complete TR doppler jet so unable to   estimate  PA systolic pressure. - Inferior vena cava: The vessel was normal in size. The   respirophasic diameter changes were in the normal range (>= 50%),   consistent with normal central venous pressure.  Impressions:  - Normal LV size with mild LV hypertrophy. EF 60-65%. Normal RV   size and systolic function. Mild aortic stenosis. Moderate   biatrial enlargement.  Medications:   Scheduled Medications: . amiodarone  400 mg Oral BID  . atorvastatin  10 mg Oral q1800  . calcium acetate  1,334 mg Oral TID WC  . cinacalcet  60 mg Oral Q supper  . diltiazem  360 mg Oral Daily  . docusate sodium  100 mg Oral BID  . dorzolamide  1 drop Right Eye BID   And  . timolol  1 drop Right Eye BID  . doxercalciferol  2 mcg Intravenous Q M,W,F-HD  . feeding supplement (NEPRO CARB STEADY)  237 mL Oral BID BM  . ferric gluconate (FERRLECIT/NULECIT) IV  62.5 mg Intravenous Weekly  . insulin  aspart  0-5 Units Subcutaneous QHS  . insulin aspart  0-9 Units Subcutaneous TID WC  . latanoprost  1 drop Right Eye QHS  . metoprolol succinate  50 mg Oral Daily  . prednisoLONE acetate  1 drop Left Eye BID  . sodium chloride  250 mL Intravenous Once  . sodium chloride flush  3 mL Intravenous Q12H  . sodium chloride flush  3 mL Intravenous Q12H  . tobramycin-dexamethasone  1 drop Left Eye QID  . warfarin  4 mg Oral q1800  . Warfarin - Pharmacist Dosing Inpatient   Does not apply q1800     PRN Medications: sodium chloride, sodium chloride, sodium chloride, acetaminophen **OR** acetaminophen, alteplase, guaiFENesin, heparin, heparin, lidocaine (PF), lidocaine-prilocaine, ondansetron **OR** ondansetron (ZOFRAN) IV, pentafluoroprop-tetrafluoroeth, sodium chloride flush   Assessment:   1. PAF and recent atypical atrial flutter with RVR. He is on Coumadin per pharmacy. Rhythm has converted back to atrial fibrillation with better heart rate control. He is currently on oral amiodarone load 400 mg twice daily along with beta blocker and calcium channel blocker.  2. ESRD on hemodialysis, sessions have been limited by hypotension in the setting of atrial arrhythmias. Tolerated session better yesterday.  3. History of tachycardia-bradycardia syndrome.   Plan/Discussion:    Continue amiodarone oral load 400 mg twice daily. Follow-up ECG pending. No other change in baseline regimen including Cardizem CD 360 mg daily and Toprol-XL 50 mg daily. No significant bradycardia noted.  Jonelle SidleSamuel G. McDowell, M.D., F.A.C.C.

## 2015-10-31 NOTE — Progress Notes (Signed)
[facility] Progress Note   Assessment/ Plan:    Dialyzes at [facility]  EDW 97.5. HD 4 hrs Bath 2K / 2Ca, Dialyzer F180, Heparin 4200 bolus Access LUE AVF Hectorol 2 mcg q treatment Mircera 50 q 4 weeks (given 9/13) Venofer 50 mg q weekly   1 Afib with RVR./ hypotension: dialysis had to be terminated 9/27 after 2 hrs due to tachycardia to 150s and hypotension.  Initially on metop and cardizem --> amio, cards following, HR much more controlled. 2 ESRD: MWF.   HD 9/29, Na 129 today which is likely due to volume.  2K bath, K 4.3, will use lower Na bath as well.  Plan for HD 10/2, got extra treatment 9/30 3 Volume: EDW raised from 97--> 97.5 as an outpatient; becoming more vol up due to inability to UF .  Better with extra rx 9/30, again on 10/2 4. Anemia of ESRD: Mircera started on 9/13, not due until 10/13.  Hgb 10.8 on 9/30. On weekly Fe 5. Metabolic Bone Disease: PTH 240, Phos 6.5, corrected Ca 9.9.  On hectorol and Phoslo 2 tabs TID with meals in addition to sensipar 60 mg daily.  phos 7.1, switched to renal diet with fluid restriction.   6.  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  Does not appear to be at imminent risk of rupture, VVS consulted and pt does not desire surgery for this ever and he reiterated this to me as well. Cannulating away from thinned sites. 7.  Nutrition: nepro supps, albumin 2.9. 8.  Coughing: ? Microaspiration but better with UF .  Subjective:    Coughing is much better.  Dialyzed yesterday with 2.5L off.  Tolerated well.   Objective:   BP 112/82 (BP Location: Right Arm)   Pulse 63   Temp 98.2 F (36.8 C) (Oral)   Resp 20   Ht 6\' 5"  (1.956 m)   Wt 104.5 kg (230 lb 6.1 oz)   SpO2 98%   BMI 27.32 kg/m   Physical Exam: GEN:NAD, coughing improvied HEENT: blind, MMM, edentulous NECK: minimal JVD PULM: wheezing improved CV :  Tachy, irregular, soft systolic murmur ABD: obese, nontender, nondistended, NABS EXT: 1+ LE edema LUE AVF with  aneuysmal areas with hypopigmented.  No frank ulcers. NEURO: AAO x 3  Labs: [employer]  Recent Labs Lab 10/25/15 1727 10/26/15 0317 10/26/15 1838 10/27/15 0350 10/29/15 0929 10/30/15 1222  NA 137 137 139 137 129* 129*  K 4.0 4.0 4.0 4.5 4.3 4.1  CL 97* 97* 96* 99* 91* 90*  CO2 23 27 27 24 22 26   GLUCOSE 164* 217* 173* 136* 200* 212*  BUN 49* 56* 65* 72* 70* 48*  CREATININE 9.16* 9.97* 11.83* 12.54* 12.44* 9.51*  CALCIUM 8.8* 8.6* 9.1 9.2 8.9 8.8*  PHOS  --  5.8* 6.1*  --  7.1* 5.7*   CBC  Recent Labs Lab 10/25/15 1727  10/26/15 1838 10/27/15 0350 10/29/15 0927 10/30/15 1222  WBC 6.2  < > 5.8 5.6 6.1 6.3  NEUTROABS 3.5  --   --   --   --   --   HGB 13.0  < > 11.2* 10.6* 10.6* 10.8*  HCT 39.6  < > 34.7* 33.4* 31.9* 33.8*  MCV 93.8  < > 95.3 95.4 92.7 95.5  PLT 167  < > 143* 147* 147* 158  < > = values in this interval not displayed.  @IMGRELPRIORS @ Medications:    . amiodarone  400 mg Oral BID  .  atorvastatin  10 mg Oral q1800  . calcium acetate  1,334 mg Oral TID WC  . cinacalcet  60 mg Oral Q supper  . diltiazem  360 mg Oral Daily  . docusate sodium  100 mg Oral BID  . dorzolamide  1 drop Right Eye BID   And  . timolol  1 drop Right Eye BID  . doxercalciferol  2 mcg Intravenous Q M,W,F-HD  . feeding supplement (NEPRO CARB STEADY)  237 mL Oral BID BM  . ferric gluconate (FERRLECIT/NULECIT) IV  62.5 mg Intravenous Weekly  . insulin aspart  0-5 Units Subcutaneous QHS  . insulin aspart  0-9 Units Subcutaneous TID WC  . latanoprost  1 drop Right Eye QHS  . metoprolol succinate  50 mg Oral Daily  . prednisoLONE acetate  1 drop Left Eye BID  . sodium chloride  250 mL Intravenous Once  . sodium chloride flush  3 mL Intravenous Q12H  . sodium chloride flush  3 mL Intravenous Q12H  . tobramycin-dexamethasone  1 drop Left Eye QID  . warfarin  4 mg Oral q1800  . Warfarin - Pharmacist Dosing Inpatient   Does not apply q1800     Bufford ButtnerElizabeth Jacier Gladu, MD [facility] Cell [phone] pgr [phone] 10/31/2015, 9:27 AM

## 2015-11-01 DIAGNOSIS — E1122 Type 2 diabetes mellitus with diabetic chronic kidney disease: Secondary | ICD-10-CM

## 2015-11-01 LAB — GLUCOSE, CAPILLARY
Glucose-Capillary: 154 mg/dL — ABNORMAL HIGH (ref 65–99)
Glucose-Capillary: 174 mg/dL — ABNORMAL HIGH (ref 65–99)
Glucose-Capillary: 119 mg/dL — ABNORMAL HIGH (ref 65–99)

## 2015-11-01 LAB — PROTIME-INR
INR: 2.72
PROTHROMBIN TIME: 29.4 s — AB (ref 11.4–15.2)

## 2015-11-01 MED ORDER — AMIODARONE HCL 400 MG PO TABS: 400.0000 mg | ORAL_TABLET | Freq: Two times a day (BID) | ORAL | 0 refills | Status: DC

## 2015-11-01 MED ORDER — METOPROLOL SUCCINATE ER 50 MG PO TB24: 50.0000 mg | ORAL_TABLET | Freq: Every day | ORAL | 0 refills | Status: AC

## 2015-11-01 MED ORDER — WARFARIN SODIUM 4 MG PO TABS: 4.0000 mg | ORAL_TABLET | Freq: Every day | ORAL | 0 refills | Status: DC

## 2015-11-01 MED ORDER — CALCIUM ACETATE (PHOS BINDER) 667 MG PO CAPS: 1334.0000 mg | ORAL_CAPSULE | Freq: Three times a day (TID) | ORAL | 0 refills | Status: AC

## 2015-11-01 MED ORDER — DOXERCALCIFEROL 4 MCG/2ML IV SOLN
INTRAVENOUS | Status: AC
  Administered 2015-11-01: 2 ug via INTRAVENOUS
  Filled 2015-11-01: qty 2

## 2015-11-01 NOTE — Progress Notes (Signed)
   11/01/15 0900  PT Visit Information  Last PT Received On 11/01/15  Reason Eval/Treat Not Completed Patient at procedure or test/unavailable  [name] [name], PT MS Acute Rehab Dept. Number: [facility] [phone] and [facility] [phone]

## 2015-11-01 NOTE — Progress Notes (Addendum)
[facility] Progress Note    Dialysis: [facility] MWF 4h   97.5kg   2/2 bath  Hep 4200   LUA AVF Hectorol 2 mcg q treatment Mircera 50 q 4 weeks (given 9/13) Venofer 50 mg q weekly  Assessment: 1 Afib with RVR./ hypotension: improved.  Hx of tachy-brady and also hx ablation for PSVT. Per card 2. ESRD: MWF hd.  HD today  3 Volume: EDW raised from 97--> 97.5.  Doubt bed wts accuracy. 4. Anemia of ESRD: Mircera started on 9/13, not due until 10/13.  Hgb 10.8 on 9/30. On weekly Fe 5. Metabolic Bone Disease: PTH 240, Phos 6.5, corrected Ca 9.9.  On hectorol and Phoslo 2 tabs TID with meals in addition to sensipar 60 mg daily.  phos 7.1, switched to renal diet with fluid restriction.   6.  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  Does not appear to be at imminent risk of rupture, VVS consulted and pt does not desire surgery for this ever and he reiterated this to me as well. Cannulating away from thinned sites. 7.  Nutrition: nepro supps, albumin 2.9. 8.  Coughing: CXR was clear  Plan - HD today, get standing or Hoyer wt  Subjective:    Coughing better. On HD now.  No c/o   Objective:   BP 114/72 (BP Location: Right Arm)   Pulse 63   Temp 97.8 F (36.6 C) (Oral)   Resp 15   Ht 6\' 5"  (1.956 m)   Wt 106 kg (233 lb 11 oz) Comment: Bedscale  SpO2 97%   BMI 27.71 kg/m   Physical Exam: GEN:NAD, coughing improvied HEENT: blind, MMM, edentulous NECK: minimal JVD PULM: wheezing improved CV :  Tachy, irregular, soft systolic murmur ABD: obese, nontender, nondistended, NABS EXT: no LE edema.  LUE AVF with aneuysmal areas with hypopigmented.  No frank ulcers. NEURO: AAO x 3  Labs: [employer]  Recent Labs Lab 10/25/15 1727 10/26/15 0317 10/26/15 1838 10/27/15 0350 10/29/15 0929 10/30/15 1222 10/31/15 1211  NA 137 137 139 137 129* 129* 131*  K 4.0 4.0 4.0 4.5 4.3 4.1 4.0  CL 97* 97* 96* 99* 91* 90* 93*  CO2 23 27 27 24 22 26 25   GLUCOSE 164* 217* 173* 136* 200*  212* 215*  BUN 49* 56* 65* 72* 70* 48* 39*  CREATININE 9.16* 9.97* 11.83* 12.54* 12.44* 9.51* 8.38*  CALCIUM 8.8* 8.6* 9.1 9.2 8.9 8.8* 8.8*  PHOS  --  5.8* 6.1*  --  7.1* 5.7* 5.2*   CBC  Recent Labs Lab 10/25/15 1727  10/27/15 0350 10/29/15 0927 10/30/15 1222 10/31/15 1211  WBC 6.2  < > 5.6 6.1 6.3 5.8  NEUTROABS 3.5  --   --   --   --   --   HGB 13.0  < > 10.6* 10.6* 10.8* 10.9*  HCT 39.6  < > 33.4* 31.9* 33.8* 33.8*  MCV 93.8  < > 95.4 92.7 95.5 95.5  PLT 167  < > 147* 147* 158 155  < > = values in this interval not displayed.  @IMGRELPRIORS @ Medications:    . amiodarone  400 mg Oral BID  . atorvastatin  10 mg Oral q1800  . calcium acetate  1,334 mg Oral TID WC  . cinacalcet  60 mg Oral Q supper  . diltiazem  360 mg Oral Daily  . docusate sodium  100 mg Oral BID  . dorzolamide  1 drop Right Eye BID   And  .  timolol  1 drop Right Eye BID  . doxercalciferol  2 mcg Intravenous Q M,W,F-HD  . feeding supplement (NEPRO CARB STEADY)  237 mL Oral BID BM  . ferric gluconate (FERRLECIT/NULECIT) IV  62.5 mg Intravenous Weekly  . insulin aspart  0-5 Units Subcutaneous QHS  . insulin aspart  0-9 Units Subcutaneous TID WC  . latanoprost  1 drop Right Eye QHS  . metoprolol succinate  50 mg Oral Daily  . prednisoLONE acetate  1 drop Left Eye BID  . sodium chloride  250 mL Intravenous Once  . sodium chloride flush  3 mL Intravenous Q12H  . sodium chloride flush  3 mL Intravenous Q12H  . tobramycin-dexamethasone  1 drop Left Eye QID  . warfarin  4 mg Oral q1800  . Warfarin - Pharmacist Dosing Inpatient   Does not apply q1800     Bufford Buttner, MD [facility] Cell [phone] pgr [phone] 11/01/2015, 11:53 AM

## 2015-11-01 NOTE — Progress Notes (Signed)
Pt to dialysis. Refusing to go in a chair. Transport taking pt in bed. Will continue to monitor.

## 2015-11-01 NOTE — Progress Notes (Signed)
IVs and tele d/c'd. Pt dressed and [facility]. Report called.

## 2015-11-01 NOTE — Clinical Social Work Note (Signed)
Per MD [facility]. RN, patient, patient's wife, and facility notified of discharge. RN given phone number for report and transport packet is on patient's chart. Ambulance transport requested. CSW signing off.   Valero EnergyShonny Quintara Bost, LCSW [phone]

## 2015-11-01 NOTE — Progress Notes (Addendum)
Primary cardiologist: Dr. Daneen Schick  Seen for followup: Atrial fibrillation/flutter  Subjective:  No chest pain; for dialysis  Objective:   Vital Signs : Vitals:   10/31/15 0522 10/31/15 1344 10/31/15 2158 11/01/15 0644  BP: 112/82 140/76 (!) 141/71 (!) 149/70  Pulse: 63 65 (!) 57 (!) 57  Resp:  '18 18 18  ' Temp: 98.2 F (36.8 C) 98.6 F (37 C) 97.9 F (36.6 C) 97.7 F (36.5 C)  TempSrc: Oral Oral Oral Oral  SpO2: 98% 97% 100% 96%  Weight: 230 lb 6.1 oz (104.5 kg)   231 lb 7.7 oz (105 kg)  Height:        Intake/Output from previous day:  Intake/Output Summary (Last 24 hours) at 11/01/15 0759 Last data filed at 10/31/15 2106  Gross per 24 hour  Intake              860 ml  Output                0 ml  Net              860 ml    I/O since admission: 84.6  Wt Readings from Last 3 Encounters:  11/01/15 231 lb 7.7 oz (105 kg)  10/08/15 212 lb 4.9 oz (96.3 kg)  09/24/15 214 lb 11.7 oz (97.4 kg)    Medications: . amiodarone  400 mg Oral BID  . atorvastatin  10 mg Oral q1800  . calcium acetate  1,334 mg Oral TID WC  . cinacalcet  60 mg Oral Q supper  . diltiazem  360 mg Oral Daily  . docusate sodium  100 mg Oral BID  . dorzolamide  1 drop Right Eye BID   And  . timolol  1 drop Right Eye BID  . doxercalciferol  2 mcg Intravenous Q M,W,F-HD  . feeding supplement (NEPRO CARB STEADY)  237 mL Oral BID BM  . ferric gluconate (FERRLECIT/NULECIT) IV  62.5 mg Intravenous Weekly  . insulin aspart  0-5 Units Subcutaneous QHS  . insulin aspart  0-9 Units Subcutaneous TID WC  . latanoprost  1 drop Right Eye QHS  . metoprolol succinate  50 mg Oral Daily  . prednisoLONE acetate  1 drop Left Eye BID  . sodium chloride  250 mL Intravenous Once  . sodium chloride flush  3 mL Intravenous Q12H  . sodium chloride flush  3 mL Intravenous Q12H  . tobramycin-dexamethasone  1 drop Left Eye QID  . warfarin  4 mg Oral q1800  . Warfarin - Pharmacist Dosing Inpatient   Does not  apply q1800       Physical Exam:   General appearance: alert and no distress Neck: no adenopathy, no JVD, supple, symmetrical, trachea midline and thyroid not enlarged, symmetric, no tenderness/mass/nodules Lungs: clear to auscultation bilaterally Heart: rate controlled irregular rhythm; 02-72; 2/6 systolic murmur; no rub Abdomen: soft, non-tender; bowel sounds normal; no masses,  no organomegaly Extremities: no edema, redness or tenderness in the calves or thighs Neurologic: Grossly normal   Rate: 56  Rhythm: atrial fibrillation  ECG (independently read by me): 10/26/15: AF at 116  Will repeat today  Lab Results:   Recent Labs  10/29/15 0929 10/30/15 1222 10/31/15 1211  NA 129* 129* 131*  K 4.3 4.1 4.0  CL 91* 90* 93*  CO2 '22 26 25  ' GLUCOSE 200* 212* 215*  BUN 70* 48* 39*  CREATININE 12.44* 9.51* 8.38*  CALCIUM 8.9 8.8* 8.8*  PHOS 7.1* 5.7*  5.2*    Hepatic Function Latest Ref Rng & Units 10/31/2015 10/30/2015 10/29/2015  Total Protein 6.5 - 8.1 g/dL - - -  Albumin 3.5 - 5.0 g/dL 3.1(L) 3.1(L) 2.9(L)  AST 15 - 41 U/L - - -  ALT 17 - 63 U/L - - -  Alk Phosphatase 38 - 126 U/L - - -  Total Bilirubin 0.3 - 1.2 mg/dL - - -  Bilirubin, Direct 0.0 - 0.3 mg/dL - - -     Recent Labs  10/29/15 0927 10/30/15 1222 10/31/15 1211  WBC 6.1 6.3 5.8  HGB 10.6* 10.8* 10.9*  HCT 31.9* 33.8* 33.8*  MCV 92.7 95.5 95.5  PLT 147* 158 155    No results for input(s): TROPONINI in the last 72 hours.  Invalid input(s): CK, MB  Lab Results  Component Value Date   TSH 1.917 10/26/2015   No results for input(s): HGBA1C in the last 72 hours.   Recent Labs  10/29/15 0929 10/30/15 1222 10/31/15 1211  ALBUMIN 2.9* 3.1* 3.1*    Recent Labs  11/01/15 0235  INR 2.72   BNP (last 3 results)  Recent Labs  08/12/15 2143 09/22/15 0833 10/25/15 1727  BNP 276.9* 568.9* 580.8*    ProBNP (last 3 results) No results for input(s): PROBNP in the last 8760  hours.   Lipid Panel  No results found for: CHOL, TRIG, HDL, CHOLHDL, VLDL, LDLCALC, LDLDIRECT    Imaging:  Echocardiogram 08/14/2015: Study Conclusions  - Left ventricle: The cavity size was normal. Wall thickness was increased in a pattern of mild LVH. Systolic function was normal. The estimated ejection fraction was in the range of 60% to 65%. Indeterminant diastolic function (atrial fibrillation). Wall motion was normal; there were no regional wall motion abnormalities. - Aortic valve: Trileaflet; moderately calcified leaflets. Transvalvular velocity was minimally increased. There was mild stenosis. There was trivial regurgitation. Mean gradient (S): 11 mm Hg. Valve area (VTI): 1.51 cm^2. Valve area (Vmax): 1.57 cm^2. Valve area (Vmean): 1.54 cm^2. - Mitral valve: Mildly calcified annulus. There was trivial regurgitation. - Left atrium: The atrium was moderately dilated. - Right ventricle: The cavity size was normal. Systolic function was normal. - Right atrium: The atrium was moderately dilated. - Pulmonary arteries: No complete TR doppler jet so unable to estimate PA systolic pressure. - Inferior vena cava: The vessel was normal in size. The respirophasic diameter changes were in the normal range (>= 50%), consistent with normal central venous pressure.  Impressions:  - Normal LV size with mild LV hypertrophy. EF 60-65%. Normal RV size and systolic function. Mild aortic stenosis. Moderate biatrial enlargement.    Assessment/Plan:   Principal Problem:   Hypotension Active Problems:   Peripheral neuropathy (HCC)   Diabetes mellitus with ESRD (end-stage renal disease) (HCC)   Sinus bradycardia-tachycardia syndrome (HCC)   PAF (paroxysmal atrial fibrillation) (HCC)   Physical deconditioning   Congestive heart failure (HCC)   Essential hypertension   ESRD on dialysis (HCC)   Paroxysmal SVT (supraventricular tachycardia)  ([place])   1. PAF: Now on amiodarone 400 mg bid started on 9/30 and 1 day of iv therapy; and also on Cardizem CD 360 and Lopressor 50 mg bid. Rate now in the 50-60s. Need to monitor; may need to decrease Cardizem to 240, particularly if also BP issues with dialysis. Will check ECG today. Baseline and LFTs are normal prior to amiodarone; will need f/u in future.  2. ESRD on hemodilysis M, W, F; dialysis today. If hypotension develops  may need to reduce cardizem.  3. H. Tachy-brady  4. S/P ablation for PSVT  5. Coumadin anticoagulation: cha2ds2vasc score 5; INR today 2.72; to receive 4 mg today.  6. Mild AS on echo  7. Hyponatremia  Na 131 today  8. DM with ESRD    Troy Sine, MD, [facility] 11/01/2015, 7:59 AM

## 2015-11-01 NOTE — Progress Notes (Signed)
ANTICOAGULATION CONSULT NOTE - Follow Up Consult  Pharmacy Consult for Warfarin Indication: atrial fibrillation  Allergies  Allergen Reactions  . Diltiazem Other (See Comments)    SEVERE STOMACH CRAMPING - Takes extended release form without problems    Patient Measurements: Height: 6\' 5"  (195.6 cm) Weight: 231 lb 7.7 oz (105 kg) IBW/kg (Calculated) : 89.1  Vital Signs: Temp: 97.7 F (36.5 C) (10/02 0644) Temp Source: Oral (10/02 0644) BP: 149/70 (10/02 0644) Pulse Rate: 57 (10/02 0644)  Labs:  Recent Labs  10/29/15 0927 10/29/15 [id number] 10/30/15 0420 10/30/15 1222 10/31/15 0146 10/31/15 1211 11/01/15 0235  HGB 10.6*  --   --  10.8*  --  10.9*  --   HCT 31.9*  --   --  33.8*  --  33.8*  --   PLT 147*  --   --  158  --  155  --   LABPROT  --   --  28.2*  --  29.6*  --  29.4*  INR  --   --  2.58  --  2.75  --  2.72  CREATININE  --  12.44*  --  9.51*  --  8.38*  --    Estimated Creatinine Clearance: 8.9 mL/min (by C-G formula based on SCr of 8.38 mg/dL (H)). On HD, MWF. Going today.   Assessment: 82 y.o.maleadmittedon 9/25/2017with hypotension. Initially treated empirically for sepsis, now thought to be caused by dehydration. Pharmacy has been consulted for warfarin dosing. Patient's INR 4.04 on admission and warfarin held initially.  INR therapeutic today at 2.72 INR 2.58>2.75>2.72, no s/sx bleeding, Hgb low stable, PLTC wnl stable, good PO intake.   Amiodarone started this admission  Goal of Therapy:  INR 2-3 Monitor platelets by anticoagulation protocol: Yes   Plan:  Warfarin 4 mg po daily  Daily INR  Allena Katzaroline E Welles, Pharm.D. PGY1 Pharmacy Resident 10/2/20179:00 AM Pager [phone]

## 2015-11-01 NOTE — Care Management Note (Signed)
Case Management Note Donn PieriniKristi Lenard Kampf RN, BSN Unit 2W-Case Manager [phone]  Patient Details  Name: [name] ErmRichard G [name] MRN: [id number] Date of Birth: 1934-01-31  Subjective/Objective:  Pt admitted with hypotension                Action/Plan: PTA [facility] was [facility]- CSW consulted- [facility]  Expected Discharge Date:      11/01/15            Expected Discharge Plan:  Skilled Nursing Facility  In-House Referral:  Clinical Social Work  Discharge planning Services  CM Consult  Post Acute Care Choice:    Choice offered to:     DME Arranged:    DME Agency:     HH Arranged:    HH Agency:     Status of Service:  Completed, signed off  If discussed at [employer] Length of Stay Meetings, dates discussed:    Discharge Disposition: skilled facility  Additional Comments:  Darrold SpanWebster, Keyia Moretto Hall, RN 11/01/2015, 2:49 PM

## 2015-11-01 NOTE — Discharge Summary (Addendum)
Physician Discharge Summary  [name] [name] ZOX:[id number] DOB: [birth year] DOA: 10/25/2015  PCP: Katy Apo, MD  Admit date: 10/25/2015 Discharge date: 11/01/2015  Admitted From: SNF Disposition:  SNF  Recommendations for Outpatient Follow-up:  1. Follow up with PCP in 1-2 weeks 2. Please obtain BMP/CBC in one week 3. Resume prior HD schedule.  4. Needs to follow up with primary cardiologist for further management of A fib and titration of amiodarone.     Discharge Condition: Stable.  CODE STATUS: DNR Diet recommendation: Heart Healthy  Brief/Interim Summary: HPI/Recap of past 24 hours: He was sent from HD to ED due to hypotension during dialysis     Assessment/Plan: Hypotensionin the setting of hemodialysis. Possibly secondary to fluid shifts. Also recent nausea and vomiting. Blood pressure currently stabilized. No evidence for sepsis. Hold off on IV antibiotics for now. I have discussed with nephrology about possible adjusting dry weight target. Cortisol; at 12.  BP medications adjusted.  Limitation with HD due to hypotension and A fib.  Appreciate cardiology assistance, patient started on  Amiodarone.  Tolerated HD better yesterday. Had dialysis today, tolerated procedure well, no hypotension or A fib.  Discussed with nephrology ok to discharge to SNF> resume prior HD schedule.    PAF (paroxysmal atrial fibrillation) (HCC) [place] vasc 2 score of 5. Coumadin per pharmacy to dose.  Appreciate cardiology assistance, plan to load with amiodarone, and change metoprolol to Toprol.  Amiodarone oral and Cardizem.  hr better controlled.   Essential hypertension  Sinus bradycardia-tachycardia syndrome; Physical deconditioningwill need to discharge with PT OT follow-up.  Diabetes mellitus with ESRD (end-stage renal disease) (HCC)with a sliding scale  End-stage renal disease on HD MWF, nephrology input appreciated   Discharge Diagnoses:  Principal  Problem:   Hypotension Active Problems:   Peripheral neuropathy (HCC)   Diabetes mellitus with ESRD (end-stage renal disease) (HCC)   Sinus bradycardia-tachycardia syndrome (HCC)   PAF (paroxysmal atrial fibrillation) (HCC)   Physical deconditioning   Congestive heart failure (HCC)   Essential hypertension   ESRD on dialysis (HCC)   Paroxysmal SVT (supraventricular tachycardia) (HCC)    Discharge Instructions  Discharge Instructions    Diet - low sodium heart healthy    Complete by:  As directed    Increase activity slowly    Complete by:  As directed        Medication List    STOP taking these medications   Calcium Acetate 667 MG Tabs   metoprolol 50 MG tablet Commonly known as:  LOPRESSOR     TAKE these medications   acetaminophen 325 MG tablet Commonly known as:  TYLENOL Take 650 mg by mouth daily as needed for mild pain.   amiodarone 400 MG tablet Commonly known as:  PACERONE Take 1 tablet (400 mg total) by mouth 2 (two) times daily.   atorvastatin 10 MG tablet Commonly known as:  LIPITOR Take 1 tablet (10 mg total) by mouth daily at 6 PM.   calcium acetate 667 MG capsule Commonly known as:  PHOSLO Take 2 capsules (1,334 mg total) by mouth 3 (three) times daily with meals.   diltiazem 360 MG 24 hr capsule Commonly known as:  CARDIZEM CD Take 1 capsule (360 mg total) by mouth daily.   docusate sodium 100 MG capsule Commonly known as:  COLACE Take 100 mg by mouth 2 (two) times daily.   dorzolamide-timolol 22.3-6.8 MG/ML ophthalmic solution Commonly known as:  COSOPT Place 1 drop into the right eye 2 (two)  times daily.   lidocaine-prilocaine cream Commonly known as:  EMLA Apply 1 application topically as needed (apply to skin near dialysis port before dialysis).   metoprolol succinate 50 MG 24 hr tablet Commonly known as:  TOPROL-XL Take 1 tablet (50 mg total) by mouth daily. Take with or immediately following a meal.   neomycin-polymyxin  b-dexamethasone 3.5-10000-0.1 Susp Commonly known as:  MAXITROL Place 1 drop into the left eye 4 (four) times daily.   prednisoLONE acetate 1 % ophthalmic suspension Commonly known as:  PRED FORTE Place 1 drop into the left eye 2 (two) times daily. Patient to use for 37 days. Filled on 03-28-15   RENA-VITE PO Take 1 tablet by mouth daily.   SENSIPAR 60 MG tablet Generic drug:  cinacalcet Take 60 mg by mouth at bedtime.   TOBRADEX ophthalmic solution Generic drug:  tobramycin-dexamethasone Place 1 drop into the left eye 4 (four) times daily. For 37 days. Filled 03-31-15   TRAVATAN Z 0.004 % Soln ophthalmic solution Generic drug:  Travoprost (BAK Free) Place 1 drop into the right eye at bedtime.   warfarin 4 MG tablet Commonly known as:  COUMADIN Take 1 tablet (4 mg total) by mouth daily at 6 PM. What changed:  medication strength  how much to take  how to take this  when to take this  additional instructions       Allergies  Allergen Reactions  . Diltiazem Other (See Comments)    SEVERE STOMACH CRAMPING - Takes extended release form without problems    Consultations:  Nephrology  Cardiology    Procedures/Studies: Dg Chest 2 View  Result Date: 10/04/2015 CLINICAL DATA:  Cough and generalized weakness. EXAM: CHEST  2 VIEW COMPARISON:  09/23/2015 and prior radiographs FINDINGS: Cardiomegaly again identified. Mild pulmonary vascular congestion noted. Very mild interstitial opacities bilaterally may represent minimal interstitial edema. Trace bilateral pleural effusions are noted. Mild left basilar atelectasis identified. There is no evidence of pneumothorax. IMPRESSION: Cardiomegaly with pulmonary vascular congestion and possible minimal interstitial pulmonary edema. Trace bilateral pleural effusions. Mild bibasilar atelectasis. Electronically Signed   By: Harmon Pier M.D.   On: 10/04/2015 09:22   Mr Brain Wo Contrast  Result Date: 10/07/2015 CLINICAL DATA:   Bilateral leg weakness. EXAM: MRI HEAD WITHOUT CONTRAST TECHNIQUE: Multiplanar, multiecho pulse sequences of the brain and surrounding structures were obtained without intravenous contrast. COMPARISON:  CT head 04/20/2015.  MRI cervical spine 10/05/2015 FINDINGS: Brain: Moderate atrophy. Ventricular enlargement consistent with atrophy. Negative for acute infarct. Chronic ischemic changes in the white matter and pons. No cortical infarct. Chronic micro hemorrhage left parietal lobe. No other areas of hemorrhage or fluid collection. Negative for mass or edema. No shift of the midline structures. Vascular: Normal flow voids. Skull and upper cervical spine: Disc protrusion and spinal stenosis at C3-4. See cervical spine MRI report from 10/05/2015 Sinuses/Orbits: Paranasal sinuses clear. Chronic severe injury to the left globe with calcification. Right globe intact. Other: Normal surrounding soft tissues IMPRESSION: Atrophy and chronic ischemic change.  No acute infarct. Disc protrusion C3-4 with spinal stenosis. Electronically Signed   By: Marlan Palau M.D.   On: 10/07/2015 12:32   Mr Cervical Spine Wo Contrast  Result Date: 10/05/2015 CLINICAL DATA:  Inability to walk.  General weakness. EXAM: MRI CERVICAL SPINE WITHOUT CONTRAST TECHNIQUE: Multiplanar, multisequence MR imaging of the cervical spine was performed. No intravenous contrast was administered. COMPARISON:  Cervical spine CT 04/04/2015 FINDINGS: Despite efforts by the technologist and patient, motion artifact  is present on today's examination and could not be eliminated. This reduces the sensitivity and specificity of the study. Alignment: Reversal of the normal cervical lordosis. Grade 1 anterolisthesis at C3-C4. Vertebrae: Degenerative endplate signal changes at C4-C7. No evidence of acute compression fracture. No facet edema. Cord: Faint hyperintense T2 weighted signal within the spinal cord at the C3-4 level. Normal cord caliber. No syrinx. Posterior  Fossa, vertebral arteries, paraspinal tissues: Visualized posterior fossa is normal. Vertebral artery flow voids are preserved. Normal visualized paraspinal soft tissues. Disc levels: C1-C2: Advanced degenerative change. C2-C3: Small left central disc protrusion. No spinal canal stenosis. No neuroforaminal stenosis. C3-C4: There is a left central disc extrusion with mild superior migration that effaces the ventral thecal sac and mildly flattens the left ventral aspect of the spinal cord. Minimal T2 hyperintensity is seen within the cord at this level. Mild central spinal canal stenosis. Bilateral uncovertebral hypertrophy, worse on the left and left-greater-than-right facet hypertrophy, causing mild right and moderate left neuroforaminal stenosis. C4-C5: Severe disc space loss. In the anterior osteophyte formation and uncovertebral hypertrophy. Mild bilateral facet hypertrophy. Small disc bulge. No spinal canal stenosis. Moderate right and mild left neuroforaminal stenosis. C5-C6: Severe disc space loss with small disc bulge. Anterior osteophyte formation, bilateral uncovertebral hypertrophy and mild bilateral facet hypertrophy. No spinal canal stenosis. Mild bilateral neuroforaminal stenosis. C6-C7: Severe disc space loss with osteophyte formation and bilateral uncovertebral hypertrophy. Small disc bulge. No spinal canal stenosis. Mild bilateral neuroforaminal stenosis. C7-T1: Normal disc space and facet joints. No spinal canal stenosis. No neuroforaminal stenosis. IMPRESSION: 1. Left central disc extrusion with mild superior migration at C3-4 flattening the left ventral aspect of the spinal cord with associated faint T2 hyperintensity, suggesting a degree of myelomalacia. 2. Two severe degenerative disc disease with advanced height loss from C4-C7 without other spinal canal stenosis. 3. Multilevel mild-to-moderate neural foraminal narrowing. Electronically Signed   By: Deatra Robinson M.D.   On: 10/05/2015 23:29    Mr Lumbar Spine Wo Contrast  Result Date: 10/05/2015 CLINICAL DATA:  Difficulty walking.  Generalized weakness. EXAM: MRI LUMBAR SPINE WITHOUT CONTRAST TECHNIQUE: Multiplanar, multisequence MR imaging of the lumbar spine was performed. No intravenous contrast was administered. COMPARISON:  None. FINDINGS: Segmentation:  Normal Alignment:  Normal Vertebrae: There are degenerative endplate signal changes at L4-L5 and L5-S1. No other focal marrow lesion. No facet edema. Conus medullaris: Extends to the L2 level and appears normal. Paraspinal and other soft tissues: Severe bilateral renal atrophy with multiple T2 hyperintense lesions are likely cysts. Other visualized retroperitoneal soft tissues are normal. Disc levels: T12-L1: Normal disc space and facet joints. No spinal canal stenosis. No neuroforaminal stenosis. L1-L2: Normal disc space and facet joints. No spinal canal stenosis. No neuroforaminal stenosis. L2-L3: Normal disc space and facet joints. No spinal canal stenosis. No neuroforaminal stenosis. L3-L4: Disc desiccation with small bulge and severe bilateral facet hypertrophy with fluid in the left facet joint. No spinal canal stenosis. Moderate left neuroforaminal stenosis. L4-L5: Disc desiccation with small bulge and severe bilateral facet hypertrophy. Fluid within the left facet joint. Bilateral lateral recess narrowing without central spinal canal stenosis. Moderate right and mild left neuroforaminal stenosis. L5-S1: Disc desiccation with small central disc protrusion. Severe right and mild left facet hypertrophy. No spinal canal stenosis. Severe right and moderate to severe left neural foraminal stenosis neuroforaminal stenosis. IMPRESSION: 1. Moderate to severe neural foraminal narrowing at L3-S1, worst at right L5-S1. 2. Multilevel severe facet arthrosis with fluid in the facet joints  that may indicate a degree of segmental instability. 3. No spinal canal stenosis. Electronically Signed   By: Deatra Robinson M.D.   On: 10/05/2015 23:35   Dg Chest Portable 1 View  Result Date: 10/25/2015 CLINICAL DATA:  Hypotension, history of atrial flutter and bradycardia. History of diabetes, end-stage renal disease EXAM: PORTABLE CHEST 1 VIEW COMPARISON:  PA and lateral chest x-ray of October 04, 2015 FINDINGS: The lungs are mildly hypoinflated. The interstitial markings are mildly increased but are not as conspicuous as on the study of October 04, 2015. The cardiac silhouette remains mildly enlarged. The pulmonary vascularity is not clearly engorged. The mediastinum is normal in width. The bony thorax exhibits no acute abnormality. IMPRESSION: Improved appearance of the pulmonary interstitium since the previous study. Stable mild cardiomegaly without over pulmonary vascular congestion. No evidence of pneumonia. Electronically Signed   By: David  [place] M.D.   On: 10/25/2015 15:17       Subjective: No complaints.   Discharge Exam: Vitals:   11/01/15 1257 11/01/15 1352  BP: 130/75 119/75  Pulse: (!) 110 (!) 112  Resp: (!) 22   Temp: 98 F (36.7 C)    Vitals:   11/01/15 1155 11/01/15 1225 11/01/15 1257 11/01/15 1352  BP: 111/66 119/78 130/75 119/75  Pulse: 68 (!) 114 (!) 110 (!) 112  Resp: 16 (!) 22 (!) 22   Temp:   98 F (36.7 C)   TempSrc:   Oral   SpO2:   97%   Weight:   104.2 kg (229 lb 11.5 oz)   Height:        General: Pt is alert, awake, not in acute distress Cardiovascular: RRR, S1/S2 +, no rubs, no gallops Respiratory: CTA bilaterally, no wheezing, no rhonchi Abdominal: Soft, NT, ND, bowel sounds + Extremities: no edema, no cyanosis    The results of significant diagnostics from this hospitalization (including imaging, microbiology, ancillary and laboratory) are listed below for reference.     Microbiology: Recent Results (from the past 240 hour(s))  Culture, blood (routine x 2)     Status: None   Collection Time: 10/25/15  5:27 PM  Result Value Ref Range Status    Specimen Description BLOOD RIGHT WRIST  Final   Special Requests BOTTLES DRAWN AEROBIC AND ANAEROBIC 5CC  Final   Culture NO GROWTH 5 DAYS  Final   Report Status 10/30/2015 FINAL  Final  Urine culture     Status: Abnormal   Collection Time: 10/25/15  9:05 PM  Result Value Ref Range Status   Specimen Description URINE, RANDOM  Final   Special Requests NONE  Final   Culture MULTIPLE SPECIES PRESENT, SUGGEST RECOLLECTION (A)  Final   Report Status 10/27/2015 FINAL  Final  MRSA PCR Screening     Status: None   Collection Time: 10/25/15 11:51 PM  Result Value Ref Range Status   MRSA by PCR NEGATIVE NEGATIVE Final    Comment:        The GeneXpert MRSA Assay (FDA approved for NASAL specimens only), is one component of a comprehensive MRSA colonization surveillance program. It is not intended to diagnose MRSA infection nor to guide or monitor treatment for MRSA infections.      Labs: BNP (last 3 results)  Recent Labs  08/12/15 2143 09/22/15 0833 10/25/15 1727  BNP 276.9* 568.9* 580.8*   Basic Metabolic Panel:  Recent Labs Lab 10/26/15 0317 10/26/15 1838 10/27/15 0350 10/29/15 0929 10/30/15 1222 10/31/15 1211  NA 137 139 137  129* 129* 131*  K 4.0 4.0 4.5 4.3 4.1 4.0  CL 97* 96* 99* 91* 90* 93*  CO2 27 27 24 22 26 25   GLUCOSE 217* 173* 136* 200* 212* 215*  BUN 56* 65* 72* 70* 48* 39*  CREATININE 9.97* 11.83* 12.54* 12.44* 9.51* 8.38*  CALCIUM 8.6* 9.1 9.2 8.9 8.8* 8.8*  MG 2.4  --   --   --   --   --   PHOS 5.8* 6.1*  --  7.1* 5.7* 5.2*   Liver Function Tests:  Recent Labs Lab 10/25/15 1727 10/26/15 0317 10/26/15 1838 10/29/15 0929 10/30/15 1222 10/31/15 1211  AST 20 15  --   --   --   --   ALT 22 19  --   --   --   --   ALKPHOS 56 52  --   --   --   --   BILITOT 1.0 0.8  --   --   --   --   PROT 7.4 6.5  --   --   --   --   ALBUMIN 3.2* 2.9* 3.1* 2.9* 3.1* 3.1*   No results for input(s): LIPASE, AMYLASE in the last 168 hours. No results for  input(s): AMMONIA in the last 168 hours. CBC:  Recent Labs Lab 10/25/15 1727  10/26/15 1838 10/27/15 0350 10/29/15 0927 10/30/15 1222 10/31/15 1211  WBC 6.2  < > 5.8 5.6 6.1 6.3 5.8  NEUTROABS 3.5  --   --   --   --   --   --   HGB 13.0  < > 11.2* 10.6* 10.6* 10.8* 10.9*  HCT 39.6  < > 34.7* 33.4* 31.9* 33.8* 33.8*  MCV 93.8  < > 95.3 95.4 92.7 95.5 95.5  PLT 167  < > 143* 147* 147* 158 155  < > = values in this interval not displayed. Cardiac Enzymes:  Recent Labs Lab 10/25/15 2146 10/26/15 0317  TROPONINI 0.04* 0.04*   BNP: Invalid input(s): POCBNP CBG:  Recent Labs Lab 10/31/15 1153 10/31/15 1616 10/31/15 2106 11/01/15 0642 11/01/15 1351  GLUCAP 195* 264* 120* 174* 119*   D-Dimer No results for input(s): DDIMER in the last 72 hours. Hgb A1c No results for input(s): HGBA1C in the last 72 hours. Lipid Profile No results for input(s): CHOL, HDL, LDLCALC, TRIG, CHOLHDL, LDLDIRECT in the last 72 hours. Thyroid function studies No results for input(s): TSH, T4TOTAL, T3FREE, THYROIDAB in the last 72 hours.  Invalid input(s): FREET3 Anemia work up No results for input(s): VITAMINB12, FOLATE, FERRITIN, TIBC, IRON, RETICCTPCT in the last 72 hours. Urinalysis    Component Value Date/Time   COLORURINE YELLOW 10/25/2015 2105   APPEARANCEUR TURBID (A) 10/25/2015 2105   LABSPEC 1.023 10/25/2015 2105   PHURINE 6.5 10/25/2015 2105   GLUCOSEU 100 (A) 10/25/2015 2105   HGBUR LARGE (A) 10/25/2015 2105   BILIRUBINUR SMALL (A) 10/25/2015 2105   KETONESUR 15 (A) 10/25/2015 2105   PROTEINUR >300 (A) 10/25/2015 2105   UROBILINOGEN 0.2 01/26/2010 1707   NITRITE POSITIVE (A) 10/25/2015 2105   LEUKOCYTESUR LARGE (A) 10/25/2015 2105   Sepsis Labs Invalid input(s): PROCALCITONIN,  WBC,  LACTICIDVEN Microbiology Recent Results (from the past 240 hour(s))  Culture, blood (routine x 2)     Status: None   Collection Time: 10/25/15  5:27 PM  Result Value Ref Range Status    Specimen Description BLOOD RIGHT WRIST  Final   Special Requests BOTTLES DRAWN AEROBIC AND ANAEROBIC 5CC  Final  Culture NO GROWTH 5 DAYS  Final   Report Status 10/30/2015 FINAL  Final  Urine culture     Status: Abnormal   Collection Time: 10/25/15  9:05 PM  Result Value Ref Range Status   Specimen Description URINE, RANDOM  Final   Special Requests NONE  Final   Culture MULTIPLE SPECIES PRESENT, SUGGEST RECOLLECTION (A)  Final   Report Status 10/27/2015 FINAL  Final  MRSA PCR Screening     Status: None   Collection Time: 10/25/15 11:51 PM  Result Value Ref Range Status   MRSA by PCR NEGATIVE NEGATIVE Final    Comment:        The GeneXpert MRSA Assay (FDA approved for NASAL specimens only), is one component of a comprehensive MRSA colonization surveillance program. It is not intended to diagnose MRSA infection nor to guide or monitor treatment for MRSA infections.      Time coordinating discharge: Over 30 minutes  SIGNED:   Alba Coryegalado, Lane Kjos A, MD  [facility] 11/01/2015, 2:32 PM Pager [phone]  If 7PM-7AM, please contact night-coverage [email or web address] Password [email or web address]

## 2015-11-16 ENCOUNTER — Ambulatory Visit (INDEPENDENT_AMBULATORY_CARE_PROVIDER_SITE_OTHER): Payer: Medicare Other | Admitting: Interventional Cardiology

## 2015-11-16 ENCOUNTER — Encounter: Payer: Self-pay | Admitting: Interventional Cardiology

## 2015-11-16 ENCOUNTER — Encounter (INDEPENDENT_AMBULATORY_CARE_PROVIDER_SITE_OTHER): Payer: Self-pay

## 2015-11-16 VITALS — BP 114/66 | HR 60 | Ht 77.0 in | Wt 229.8 lb

## 2015-11-16 DIAGNOSIS — I495 Sick sinus syndrome: Secondary | ICD-10-CM | POA: Diagnosis not present

## 2015-11-16 DIAGNOSIS — I480 Paroxysmal atrial fibrillation: Secondary | ICD-10-CM | POA: Diagnosis not present

## 2015-11-16 DIAGNOSIS — Z79899 Other long term (current) drug therapy: Secondary | ICD-10-CM

## 2015-11-16 DIAGNOSIS — I10 Essential (primary) hypertension: Secondary | ICD-10-CM

## 2015-11-16 DIAGNOSIS — Z992 Dependence on renal dialysis: Secondary | ICD-10-CM

## 2015-11-16 DIAGNOSIS — N186 End stage renal disease: Secondary | ICD-10-CM

## 2015-11-16 MED ORDER — AMIODARONE HCL 200 MG PO TABS: ORAL_TABLET | ORAL | 3 refills | Status: AC

## 2015-11-16 NOTE — Patient Instructions (Signed)
Medication Instructions:  Your physician has recommended you make the following change in your medication:  Decrease amiodarone to 200 mg by mouth twice daily until 10/30.  On 10/31 decrease amiodarone to 200 mg by mouth daily.     Labwork: none  Testing/Procedures: none  Follow-Up: Your physician recommends that you schedule a follow-up appointment in: 3-4 weeks with Dr. Katrinka BlazingSmith or NP/PA.     Any Other Special Instructions Will Be Listed Below (If Applicable).     If you need a refill on your cardiac medications before your next appointment, please call your pharmacy.

## 2015-11-16 NOTE — Progress Notes (Signed)
Cardiology Office Note    Date:  11/16/2015   ID:  [name] [name], DOB February 22, 1933, MRN [id number]  PCP:  [name] Apo, MD  Cardiologist: [name] Noe, MD   Chief Complaint  Patient presents with  . Atrial Fibrillation  . Congestive Heart Failure    History of Present Illness:  [name] [name] is a 82 y.o. male has a past medical history of ESRD, multiple episodes of Afib with RVR, HTN, DM. He has previously been on amiodarone and has a hx of ablation for PSVT.   Chadvasc score 5. No mention of CAD noted.   Patient is asymptomatic. He is at an assisted living facility. He is attended today by a caregiver. His wife is not present. He has no complaints. No medication side effects.   Past Medical History:  Diagnosis Date  . Anemia   . Atrial flutter (HCC)   . Bradycardia    a. unable to tolerate beta blockers in the past.  . Diabetes mellitus (HCC)   . ESRD (end stage renal disease) on dialysis [facility])    "MWF; [address]" (04/08/2015)  . Glaucoma   . Gout   . Hypertension   . PAF (paroxysmal atrial fibrillation) (HCC)   . Peripheral neuropathy (HCC)   . PSVT (paroxysmal supraventricular tachycardia) (HCC)    a. h/o ablation.    Past Surgical History:  Procedure Laterality Date  . ABLATION OF DYSRHYTHMIC FOCUS    . SP AV DIALYSIS SHUNT ACCESS EXISTING *L*      Current Medications: Outpatient Medications Prior to Visit  Medication Sig Dispense Refill  . acetaminophen (TYLENOL) 325 MG tablet Take 650 mg by mouth daily as needed for mild pain.    Marland Kitchen atorvastatin (LIPITOR) 10 MG tablet Take 1 tablet (10 mg total) by mouth daily at 6 PM. 30 tablet 6  . B Complex-C-Folic Acid (RENA-VITE PO) Take 1 tablet by mouth daily.    . calcium acetate (PHOSLO) 667 MG capsule Take 2 capsules (1,334 mg total) by mouth 3 (three) times daily with meals. 90 capsule 0  . diltiazem (CARDIZEM CD) 360 MG 24 hr capsule Take 1 capsule (360 mg total) by mouth daily. 30 capsule 6    . docusate sodium (COLACE) 100 MG capsule Take 100 mg by mouth 2 (two) times daily.     . dorzolamide-timolol (COSOPT) 22.3-6.8 MG/ML ophthalmic solution Place 1 drop into the right eye 2 (two) times daily.     Marland Kitchen lidocaine-prilocaine (EMLA) cream Apply 1 application topically as needed (apply to skin near dialysis port before dialysis).     . metoprolol succinate (TOPROL-XL) 50 MG 24 hr tablet Take 1 tablet (50 mg total) by mouth daily. Take with or immediately following a meal. 30 tablet 0  . neomycin-polymyxin b-dexamethasone (MAXITROL) 3.5-10000-0.1 SUSP Place 1 drop into the left eye 4 (four) times daily.    . prednisoLONE acetate (PRED FORTE) 1 % ophthalmic suspension Place 1 drop into the left eye 2 (two) times daily. Patient to use for 37 days. Filled on 03-28-15    . SENSIPAR 60 MG tablet Take 60 mg by mouth at bedtime.     Marland Kitchen tobramycin-dexamethasone (TOBRADEX) ophthalmic solution Place 1 drop into the left eye 4 (four) times daily. For 37 days. Filled 03-31-15    . warfarin (COUMADIN) 4 MG tablet Take 1 tablet (4 mg total) by mouth daily at 6 PM. 30 tablet 0  . amiodarone (PACERONE) 400 MG tablet Take  1 tablet (400 mg total) by mouth 2 (two) times daily. 60 tablet 0  . TRAVATAN Z 0.004 % SOLN ophthalmic solution Place 1 drop into the right eye at bedtime.      No facility-administered medications prior to visit.      Allergies:   Diltiazem   Social History   Social History  . Marital status: Married    Spouse name: N/A  . Number of children: N/A  . Years of education: N/A   Social History Main Topics  . Smoking status: Never Smoker  . Smokeless tobacco: Never Used  . Alcohol use No  . Drug use: No  . Sexual activity: Not Asked   Other Topics Concern  . None   Social History Narrative  . None     Family History:  The patient's family history includes Alcoholism in his brother and brother; Heart failure in his father; Other in his mother.   ROS:   Please see the  history of present illness.    Weakness, some dyspnea. When he has dysrhythmia he does not feel palpitations or other symptoms.  All other systems reviewed and are negative.   PHYSICAL EXAM:   VS:  BP 114/66   Pulse 60   Ht 6\' 5"  (1.956 m)   Wt 229 lb 12.8 oz (104.2 kg)   BMI 27.25 kg/m    GEN: Well nourished, well developed, in no acute distress  HEENT: normal  Neck: no JVD, carotid bruits, or masses Cardiac: RRR; There is a 2/6 systolic murmur; rubs, or gallops. No edema is noted. Respiratory:  clear to auscultation bilaterally, normal work of breathing GI: soft, nontender, nondistended, + BS MS: no deformity or atrophy  Skin: warm and dry, no rash Neuro:  Alert and Oriented x 3, Strength and sensation are intact Psych: euthymic mood, full affect  Wt Readings from Last 3 Encounters:  11/16/15 229 lb 12.8 oz (104.2 kg)  11/01/15 229 lb 11.5 oz (104.2 kg)  10/08/15 212 lb 4.9 oz (96.3 kg)      Studies/Labs Reviewed:   EKG:  EKG  Not repeated.  Recent Labs: 10/25/2015: B Natriuretic Peptide 580.8 10/26/2015: ALT 19; Magnesium 2.4; TSH 1.917 10/31/2015: BUN 39; Creatinine, Ser 8.38; Hemoglobin 10.9; Platelets 155; Potassium 4.0; Sodium 131   Lipid Panel No results found for: CHOL, TRIG, HDL, CHOLHDL, VLDL, LDLCALC, LDLDIRECT  Additional studies/ records that were reviewed today include:   Echocardiogram 2016 Study Conclusions   - Left ventricle: The cavity size was normal. Wall thickness was   increased in a pattern of mild LVH. Systolic function was normal.   The estimated ejection fraction was in the range of 60% to 65%.   Indeterminant diastolic function (atrial fibrillation). Wall   motion was normal; there were no regional wall motion   abnormalities. - Aortic valve: Trileaflet; moderately calcified leaflets.   Transvalvular velocity was minimally increased. There was mild   stenosis. There was trivial regurgitation. Mean gradient (S): 11   mm Hg. Valve area  (VTI): 1.51 cm^2. Valve area (Vmax): 1.57 cm^2.   Valve area (Vmean): 1.54 cm^2. - Mitral valve: Mildly calcified annulus. There was trivial   regurgitation. - Left atrium: The atrium was moderately dilated. - Right ventricle: The cavity size was normal. Systolic function   was normal. - Right atrium: The atrium was moderately dilated. - Pulmonary arteries: No complete TR doppler jet so unable to   estimate PA systolic pressure. - Inferior vena cava: The vessel was  normal in size. The   respirophasic diameter changes were in the normal range (>= 50%),   consistent with normal central venous pressure.   Impressions:   - Normal LV size with mild LV hypertrophy. EF 60-65%. Normal RV   size and systolic function. Mild aortic stenosis. Moderate   biatrial enlargement.    ASSESSMENT:    1. PAF (paroxysmal atrial fibrillation) (HCC)   2. Sinus bradycardia-tachycardia syndrome (HCC)   3. Essential hypertension   4. ESRD on dialysis (HCC)   5. On amiodarone therapy      PLAN:  In order of problems listed above:  1. Amiodarone is decreased to 200 mg twice a day until 11/29/15 after which the dose will be decreased to 200 mg daily. 2. Previously seen by Dr. Ladona Ridgelaylor to consider permanent pacemaker. Not felt to be a candidate. Now that he is really loading with amiodarone we need to be careful to avoid excessive bradycardia. For the time being we will continue diltiazem and metoprolol. If heart rates get into the mid to low 50s, the beta blocker will be discontinued. 3. Adequately controlled today. 4. Continue dialysis. 5. Even though amiodarone was discontinued by Dr. Ladona Ridgelaylor in July because of bradycardia, the medications been reinstituted because of difficulty with maintaining rate control using beta blocker and calcium channel blocker therapy. The doses required would lead to significant hypotension during dialysis. 3 week follow-up with APP or me with EKG. Exclude excessive bradycardia  and decrease metoprolol as required if heart rates are significantly less than 55 bpm.    Medication Adjustments/Labs and Tests Ordered: Current medicines are reviewed at length with the patient today.  Concerns regarding medicines are outlined above.  Medication changes, Labs and Tests ordered today are listed in the Patient Instructions below. Patient Instructions  Medication Instructions:  Your physician has recommended you make the following change in your medication:  Decrease amiodarone to 200 mg by mouth twice daily until 10/30.  On 10/31 decrease amiodarone to 200 mg by mouth daily.     Labwork: none  Testing/Procedures: none  Follow-Up: Your physician recommends that you schedule a follow-up appointment in: 3-4 weeks with Dr. Katrinka BlazingSmith or NP/PA.     Any Other Special Instructions Will Be Listed Below (If Applicable).     If you need a refill on your cardiac medications before your next appointment, please call your pharmacy.      Signed, [name] NoeHenry W Aby Gessel III, MD  11/16/2015 10:45 AM    [facility] [address], [place], [place]  [address] Phone: [phone](336) [phone]; Fax: [phone]) [phone]

## 2015-11-24 LAB — PROTIME-INR: INR: 2.5 — AB (ref 0.9–1.1)

## 2015-11-25 ENCOUNTER — Telehealth: Payer: Self-pay | Admitting: *Deleted

## 2015-11-25 NOTE — Telephone Encounter (Signed)
Received [facility] on the pt & reviewed pt's EPIC chart & found out that the pt was discharged to SNF at this time.  [facility] & confirmed with RN Shelby Dubinawanda.  Also, she stated they monitor the pt's INR & dose the pt's Coumadin.  She stated she does not need a copy of the paperwork cause they have a recent one on the pt. Advised to call back if pt does get discharged from their facility & she stated they would.

## 2015-12-06 NOTE — Progress Notes (Deleted)
Cardiology Office Note    Date:  12/06/2015   ID:  [name] [name], DOB 1933/10/15, MRN [id number]  PCP:  Katy Apo, MD  Cardiologist:  Dr. Katrinka Blazing / Dr. Ladona Ridgel (EP)  CC: follow up  History of Present Illness:  [name] [name] is a 82 y.o. male with a history of ESRD on HD, PAF on Coumadin, HTN, DM and PSVT s/p previous ablation who presents to clinic for follow up.   Notes reviewed mention that he has had a number of recent hospitalizations for AFib RVR converting to SR with amiodarone. Cardiology H&P notes repeat admissions for Afib with RVR. 3/21-3/24/17 (in the setting of pneumonia and hypoxia) and 04/12/15- 04/17/15 though this hospital stay his presenting rhythm was SR during the stay had RVR. Also 04/07/15 to 04/08/15 for a fib with RVR with c/o dizziness- given IV amiodarone and then home dose increased to 200 daily and pt converted to SR. He has also been seen to have AFlutter.  During 07/2015 hospitalization Dr. Katrinka Blazing asked for EP consult and Dr. Ladona Ridgel saw the pt. Dr. Ladona Ridgel recommended just rate control, stop amiodarone, increase the beta blocker and that he was not a good candidate for PPM and hope to avoid PPM implant.   On last admission in 10/2015 he was started on an amiodarone load. He saw Dr. Katrinka Blazing in the office on 10/17 who further tapered him down to 200mg  daily. He felt this was the only option given his hx of hypotension during HD. Plan was to follow him closely and discontinue BB if HRs in 50s ( has a tendency towards bradycardia).   Today he presents to clinic for follow up.    Past Medical History:  Diagnosis Date  . Anemia   . Atrial flutter (HCC)   . Bradycardia    a. unable to tolerate beta blockers in the past.  . Diabetes mellitus (HCC)   . ESRD (end stage renal disease) on dialysis [facility])    "MWF; [address]" (04/08/2015)  . Glaucoma   . Gout   . Hypertension   . PAF (paroxysmal atrial fibrillation) (HCC)   . Peripheral neuropathy (HCC)     . PSVT (paroxysmal supraventricular tachycardia) (HCC)    a. h/o ablation.    Past Surgical History:  Procedure Laterality Date  . ABLATION OF DYSRHYTHMIC FOCUS    . SP AV DIALYSIS SHUNT ACCESS EXISTING *L*      Current Medications: Outpatient Medications Prior to Visit  Medication Sig Dispense Refill  . acetaminophen (TYLENOL) 325 MG tablet Take 650 mg by mouth daily as needed for mild pain.    Marland Kitchen amiodarone (PACERONE) 200 MG tablet Take 200 mg by mouth twice daily until 11/29/15 and then decrease to 200 mg daily 30 tablet 3  . atorvastatin (LIPITOR) 10 MG tablet Take 1 tablet (10 mg total) by mouth daily at 6 PM. 30 tablet 6  . B Complex-C-Folic Acid (RENA-VITE PO) Take 1 tablet by mouth daily.    . calcium acetate (PHOSLO) 667 MG capsule Take 2 capsules (1,334 mg total) by mouth 3 (three) times daily with meals. 90 capsule 0  . diltiazem (CARDIZEM CD) 360 MG 24 hr capsule Take 1 capsule (360 mg total) by mouth daily. 30 capsule 6  . docusate sodium (COLACE) 100 MG capsule Take 100 mg by mouth 2 (two) times daily.     . dorzolamide-timolol (COSOPT) 22.3-6.8 MG/ML ophthalmic solution Place 1 drop into the right eye 2 (two) times  daily.     . latanoprost (XALATAN) 0.005 % ophthalmic solution Place 1 drop into the right eye at bedtime.    . lidocaine-prilocaine (EMLA) cream Apply 1 application topically as needed (apply to skin near dialysis port before dialysis).     . metoprolol succinate (TOPROL-XL) 50 MG 24 hr tablet Take 1 tablet (50 mg total) by mouth daily. Take with or immediately following a meal. 30 tablet 0  . neomycin-polymyxin b-dexamethasone (MAXITROL) 3.5-10000-0.1 SUSP Place 1 drop into the left eye 4 (four) times daily.    . prednisoLONE acetate (PRED FORTE) 1 % ophthalmic suspension Place 1 drop into the left eye 2 (two) times daily. Patient to use for 37 days. Filled on 03-28-15    . SENSIPAR 60 MG tablet Take 60 mg by mouth at bedtime.     Marland Kitchen. tobramycin-dexamethasone  (TOBRADEX) ophthalmic solution Place 1 drop into the left eye 4 (four) times daily. For 37 days. Filled 03-31-15    . warfarin (COUMADIN) 4 MG tablet Take 1 tablet (4 mg total) by mouth daily at 6 PM. 30 tablet 0   No facility-administered medications prior to visit.      Allergies:   Diltiazem   Social History   Social History  . Marital status: Married    Spouse name: N/A  . Number of children: N/A  . Years of education: N/A   Social History Main Topics  . Smoking status: Never Smoker  . Smokeless tobacco: Never Used  . Alcohol use No  . Drug use: No  . Sexual activity: Not on file   Other Topics Concern  . Not on file   Social History Narrative  . No narrative on file     Family History:  The patient's ***family history includes Alcoholism in his brother and brother; Heart failure in his father; Other in his mother.     ROS:   Please see the history of present illness.    ROS All other systems reviewed and are negative.   PHYSICAL EXAM:   VS:  There were no vitals taken for this visit.   GEN: Well nourished, well developed, in no acute distress  HEENT: normal  Neck: no JVD, carotid bruits, or masses Cardiac: ***RRR; no murmurs, rubs, or gallops,no edema  Respiratory:  clear to auscultation bilaterally, normal work of breathing GI: soft, nontender, nondistended, + BS MS: no deformity or atrophy  Skin: warm and dry, no rash Neuro:  Alert and Oriented x 3, Strength and sensation are intact Psych: euthymic mood, full affect  Wt Readings from Last 3 Encounters:  11/16/15 229 lb 12.8 oz (104.2 kg)  11/01/15 229 lb 11.5 oz (104.2 kg)  10/08/15 212 lb 4.9 oz (96.3 kg)      Studies/Labs Reviewed:   EKG:  EKG is*** ordered today.  The ekg ordered today demonstrates ***  Recent Labs: 10/25/2015: B Natriuretic Peptide 580.8 10/26/2015: ALT 19; Magnesium 2.4; TSH 1.917 10/31/2015: BUN 39; Creatinine, Ser 8.38; Hemoglobin 10.9; Platelets 155; Potassium 4.0; Sodium 131     Lipid Panel No results found for: CHOL, TRIG, HDL, CHOLHDL, VLDL, LDLCALC, LDLDIRECT  Additional studies/ records that were reviewed today include:  2D ECHO: 08/14/2015 LV EF: 60% -   65% Study Conclusions - Left ventricle: The cavity size was normal. Wall thickness was   increased in a pattern of mild LVH. Systolic function was normal.   The estimated ejection fraction was in the range of 60% to 65%.   Indeterminant diastolic  function (atrial fibrillation). Wall   motion was normal; there were no regional wall motion   abnormalities. - Aortic valve: Trileaflet; moderately calcified leaflets.   Transvalvular velocity was minimally increased. There was mild   stenosis. There was trivial regurgitation. Mean gradient (S): 11   mm Hg. Valve area (VTI): 1.51 cm^2. Valve area (Vmax): 1.57 cm^2.   Valve area (Vmean): 1.54 cm^2. - Mitral valve: Mildly calcified annulus. There was trivial   regurgitation. - Left atrium: The atrium was moderately dilated. - Right ventricle: The cavity size was normal. Systolic function   was normal. - Right atrium: The atrium was moderately dilated. - Pulmonary arteries: No complete TR doppler jet so unable to   estimate PA systolic pressure. - Inferior vena cava: The vessel was normal in size. The   respirophasic diameter changes were in the normal range (>= 50%),   consistent with normal central venous pressure. Impressions: - Normal LV size with mild LV hypertrophy. EF 60-65%. Normal RV   size and systolic function. Mild aortic stenosis. Moderate   biatrial enlargement.    ASSESSMENT & PLAN:   [name] ErmRichard G [name] is a 82 y.o. male with a history of ESRD on HD, PAF on Coumadin, HTN, DM and PSVT s/p previous ablation who presents to clinic for follow up.   PAF: * by ECG today. Continue coumadin for CHADSVASC of at least 5. Continue amiodarone 200mg  daily, Toprol XL 50mg  daily, and Cardizem 360mg  daily .  HLD: continue statin  HTN: BP well  controlled currently  ESRD: on HD .  DMT2: Hg A1c 8.4  Medication Adjustments/Labs and Tests Ordered: Current medicines are reviewed at length with the patient today.  Concerns regarding medicines are outlined above.  Medication changes, Labs and Tests ordered today are listed in the Patient Instructions below. There are no Patient Instructions on file for this visit.   Signed, Cline CrockKathryn Aikam Vinje, PA-C  12/06/2015 12:26 PM    [facility] [address], [place], [place]  [address] Phone: [phone]([phone]; Fax: [phone]

## 2015-12-07 ENCOUNTER — Ambulatory Visit: Payer: Medicare Other | Admitting: Physician Assistant

## 2015-12-08 ENCOUNTER — Encounter: Payer: Self-pay | Admitting: Physician Assistant

## 2015-12-13 ENCOUNTER — Ambulatory Visit: Payer: Medicare Other | Admitting: Physician Assistant

## 2015-12-14 ENCOUNTER — Ambulatory Visit (INDEPENDENT_AMBULATORY_CARE_PROVIDER_SITE_OTHER): Payer: Medicare Other | Admitting: Interventional Cardiology

## 2015-12-14 DIAGNOSIS — Z5181 Encounter for therapeutic drug level monitoring: Secondary | ICD-10-CM

## 2015-12-14 DIAGNOSIS — I4892 Unspecified atrial flutter: Secondary | ICD-10-CM

## 2015-12-14 DIAGNOSIS — I4891 Unspecified atrial fibrillation: Secondary | ICD-10-CM | POA: Insufficient documentation

## 2015-12-14 LAB — POCT INR: INR: 2.4

## 2015-12-20 ENCOUNTER — Telehealth: Payer: Self-pay | Admitting: Physician Assistant

## 2015-12-20 NOTE — Telephone Encounter (Signed)
New message   Armond Hangaliena is calling for verbal orders for PT upper body and home safety 2x week for 4 weeks

## 2015-12-20 NOTE — Telephone Encounter (Signed)
Will forward to Dr. Katrinka BlazingSmith for review and advisement on signing orders or to defer to PCP?

## 2015-12-20 NOTE — Telephone Encounter (Signed)
PCP?

## 2015-12-20 NOTE — Telephone Encounter (Signed)
Informed Telena that orders would need to come from pt's PCP.  Telena verbalized understanding.

## 2015-12-22 ENCOUNTER — Ambulatory Visit: Payer: Self-pay | Admitting: Interventional Cardiology

## 2015-12-22 DIAGNOSIS — I4892 Unspecified atrial flutter: Secondary | ICD-10-CM

## 2015-12-22 DIAGNOSIS — Z5181 Encounter for therapeutic drug level monitoring: Secondary | ICD-10-CM

## 2015-12-22 DIAGNOSIS — I4891 Unspecified atrial fibrillation: Secondary | ICD-10-CM

## 2015-12-31 DEATH — deceased

## 2017-06-02 IMAGING — CT CT CERVICAL SPINE W/O CM
3 of 6 series · 11 of 33 positions shown, 13 images · non-contrast
Comparison: CT scan of head July 10, 2014.

CLINICAL DATA: Posttraumatic headache after falling out of bed 2
days ago. No reported loss of consciousness.

EXAM:
CT HEAD WITHOUT CONTRAST
CT CERVICAL SPINE WITHOUT CONTRAST
TECHNIQUE: Multidetector CT imaging of the head and cervical spine was
performed following the standard protocol without intravenous
contrast. Multiplanar CT image reconstructions of the cervical spine
were also generated.

[Series 8: sagittal bone · sagittal · 0.32mm/px · 5 of 76 slices shown, 6 images]
[im 26/76  bone]
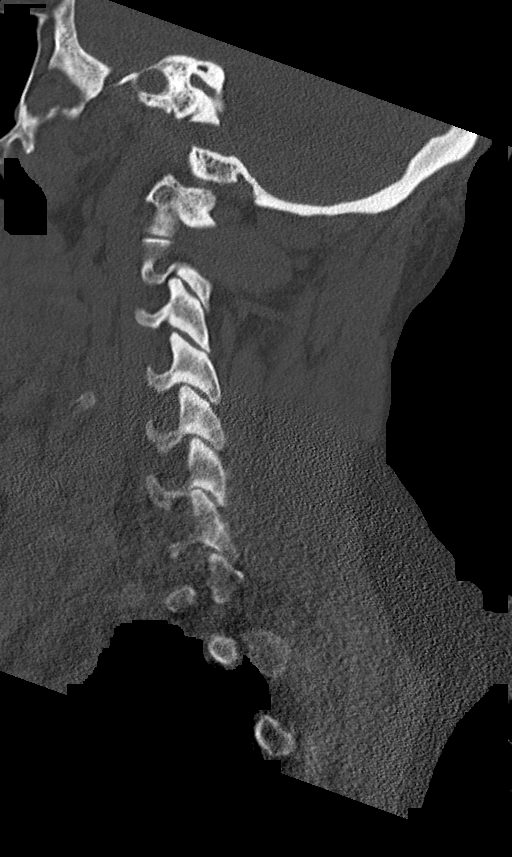
[im 32/76  bone]
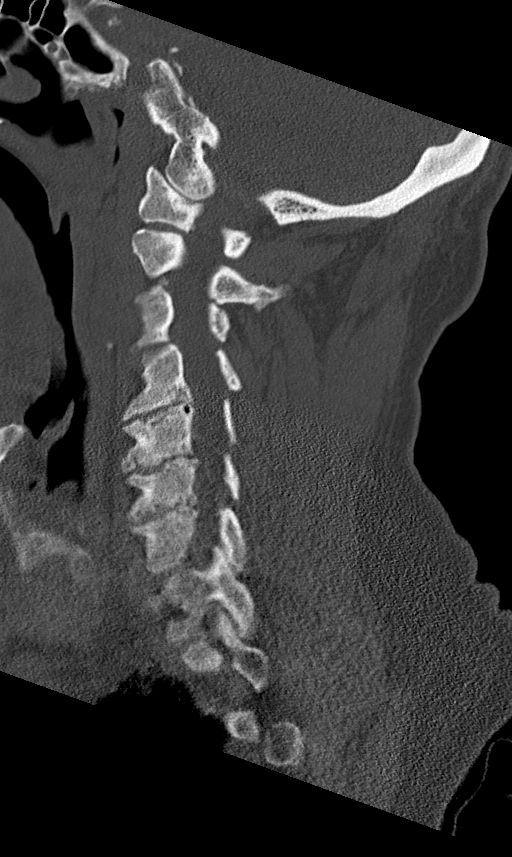
[im 38/76  soft-tissue]
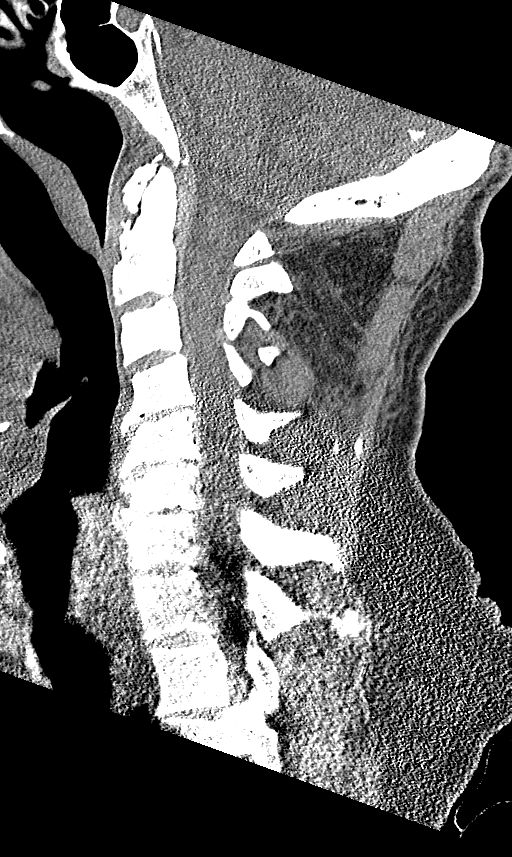
[im 38/76  bone]
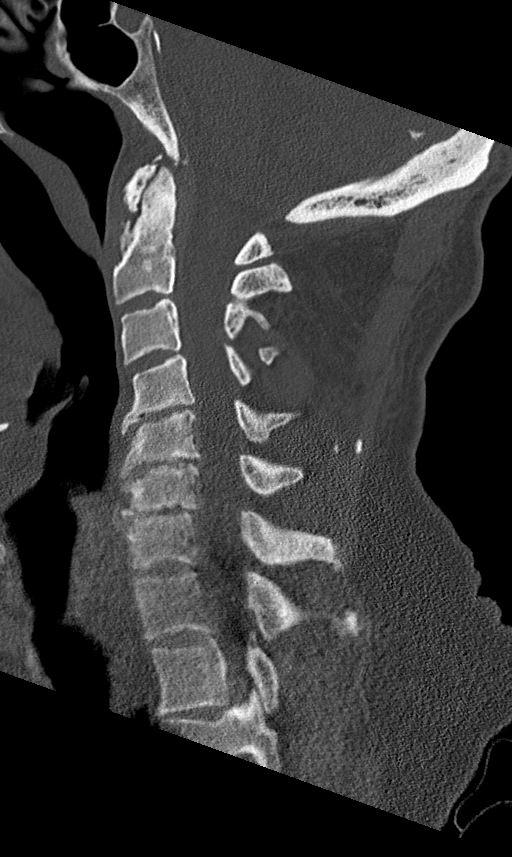
[im 44/76  bone]
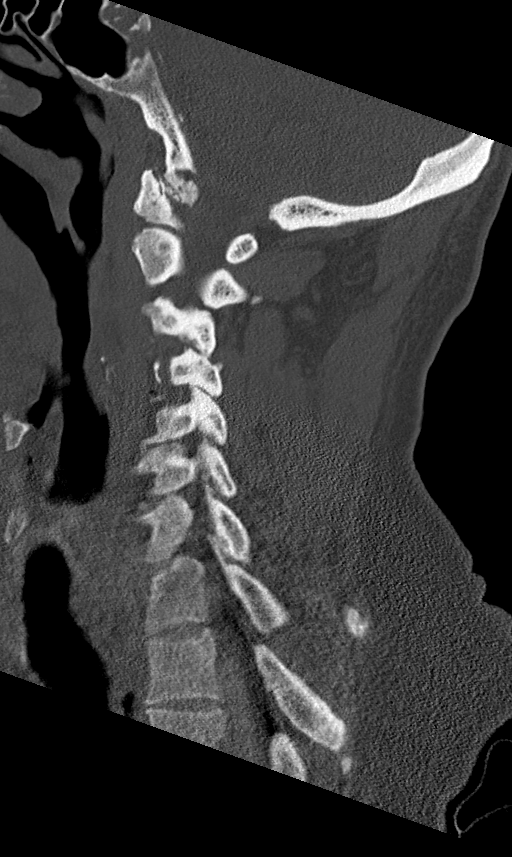
[im 51/76  bone]
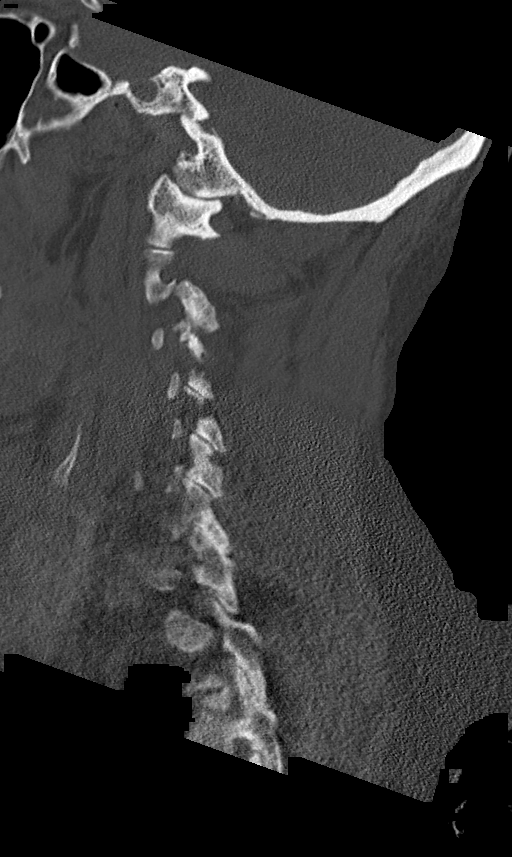

[Series 9: coronal bone · coronal · 0.36mm/px · 3 of 101 slices shown]
[im 35/101  bone]
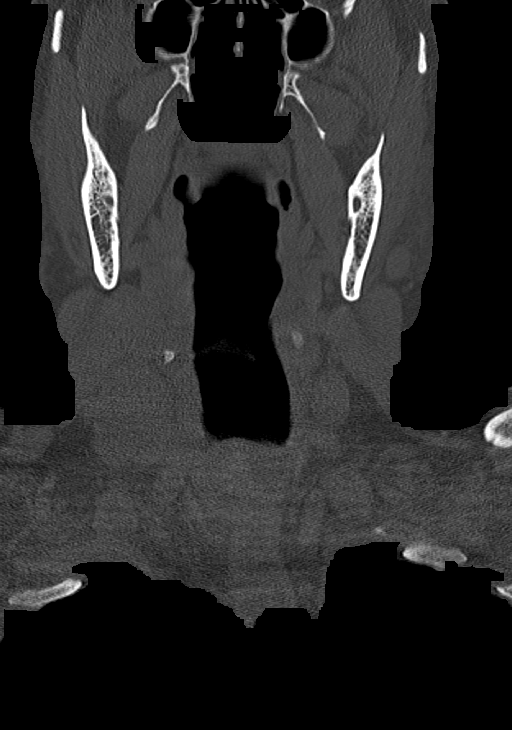
[im 45/101  bone]
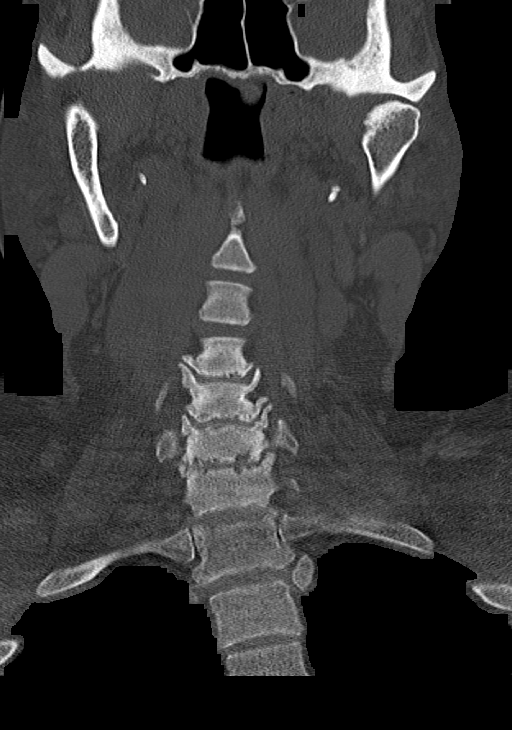
[im 56/101  bone]
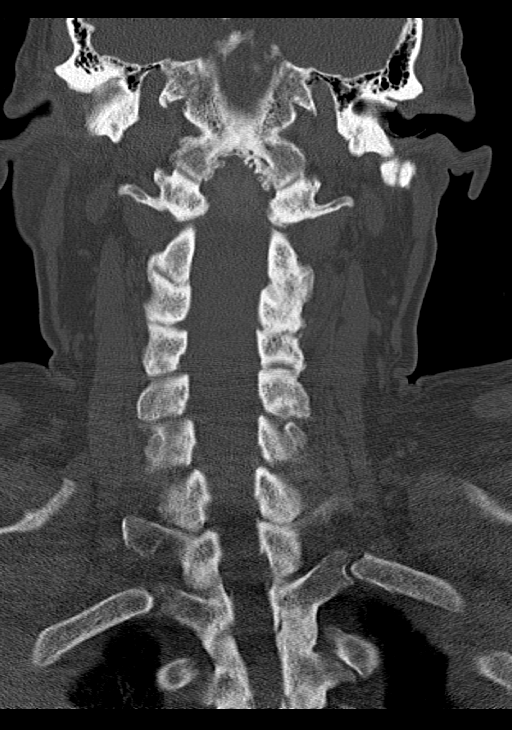

[Series 10: orthogonal axials · axial · 0.28mm/px · z∈[+1069,+1177]mm · 3 of 119 slices shown, 4 images]
[im 30/119  soft-tissue]
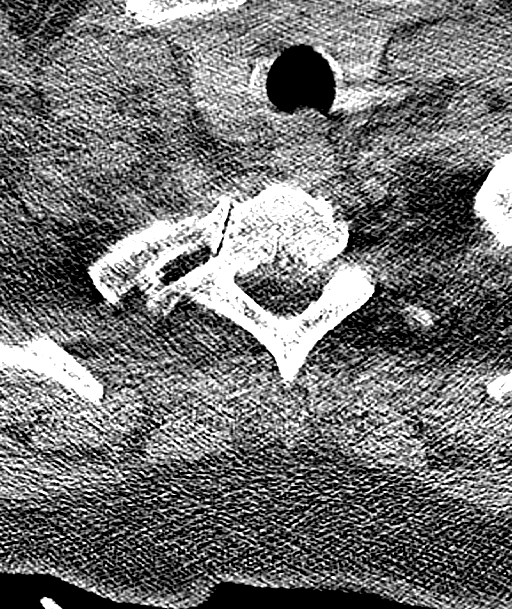
[im 30/119  bone]
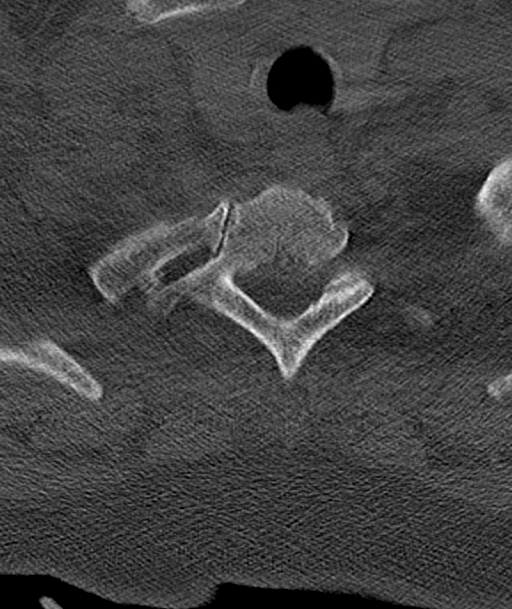
[im 60/119  bone]
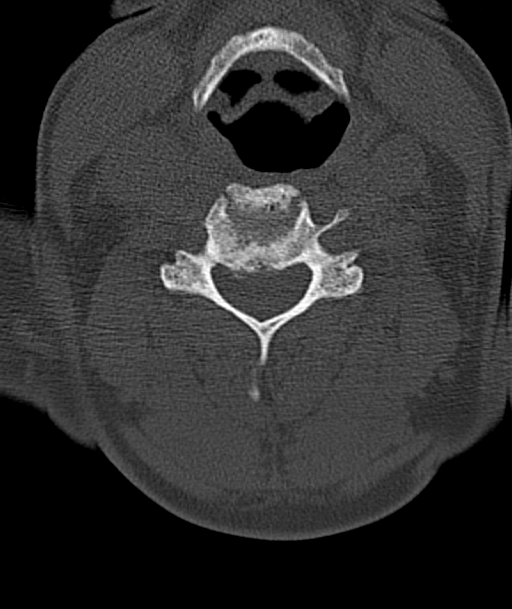
[im 89/119  bone]
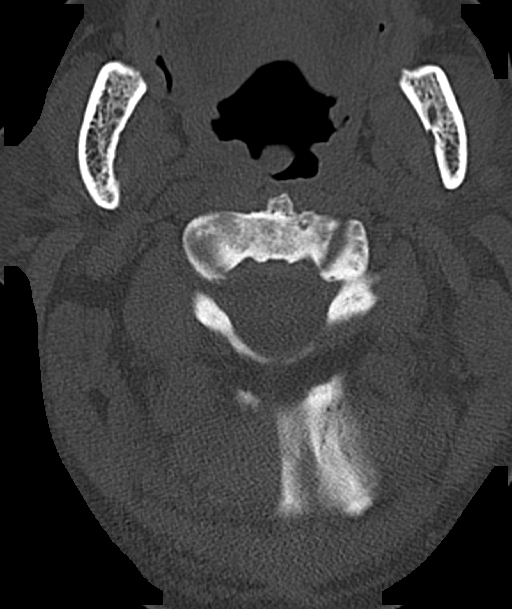

[11 of 33 positions shown; findings below may reference images not displayed]

FINDINGS: CT HEAD FINDINGS

Bony calvarium appears intact. Mild diffuse cortical atrophy is
noted. Mild chronic ischemic white matter disease is noted. No mass
effect or midline shift is noted. Ventricular size is within normal
limits. There is no evidence of mass lesion, hemorrhage or acute
infarction.

CT CERVICAL SPINE FINDINGS

Reversal of normal lordosis is noted due to degenerative disc
disease. Severe degenerative disc disease is noted at C4-5, C5-6 and
C6-7 with anterior osteophyte formation. No fracture or
spondylolisthesis is noted. Visualized upper lung zones appear
normal.
IMPRESSION: Mild diffuse cortical atrophy. Mild chronic ischemic white matter
disease. No acute intracranial abnormality seen.

Severe multilevel degenerative disc disease. No acute abnormality
seen in the cervical spine.

## 2017-11-23 IMAGING — CR DG CHEST 2V
2 series · 2 of 2 positions shown · non-contrast
Comparison: August 12, 2015

CLINICAL DATA: Chronic renal failure with weakness. History anemia
and cardiac arrhythmia

EXAM:
CHEST  2 VIEW

[chest lat]
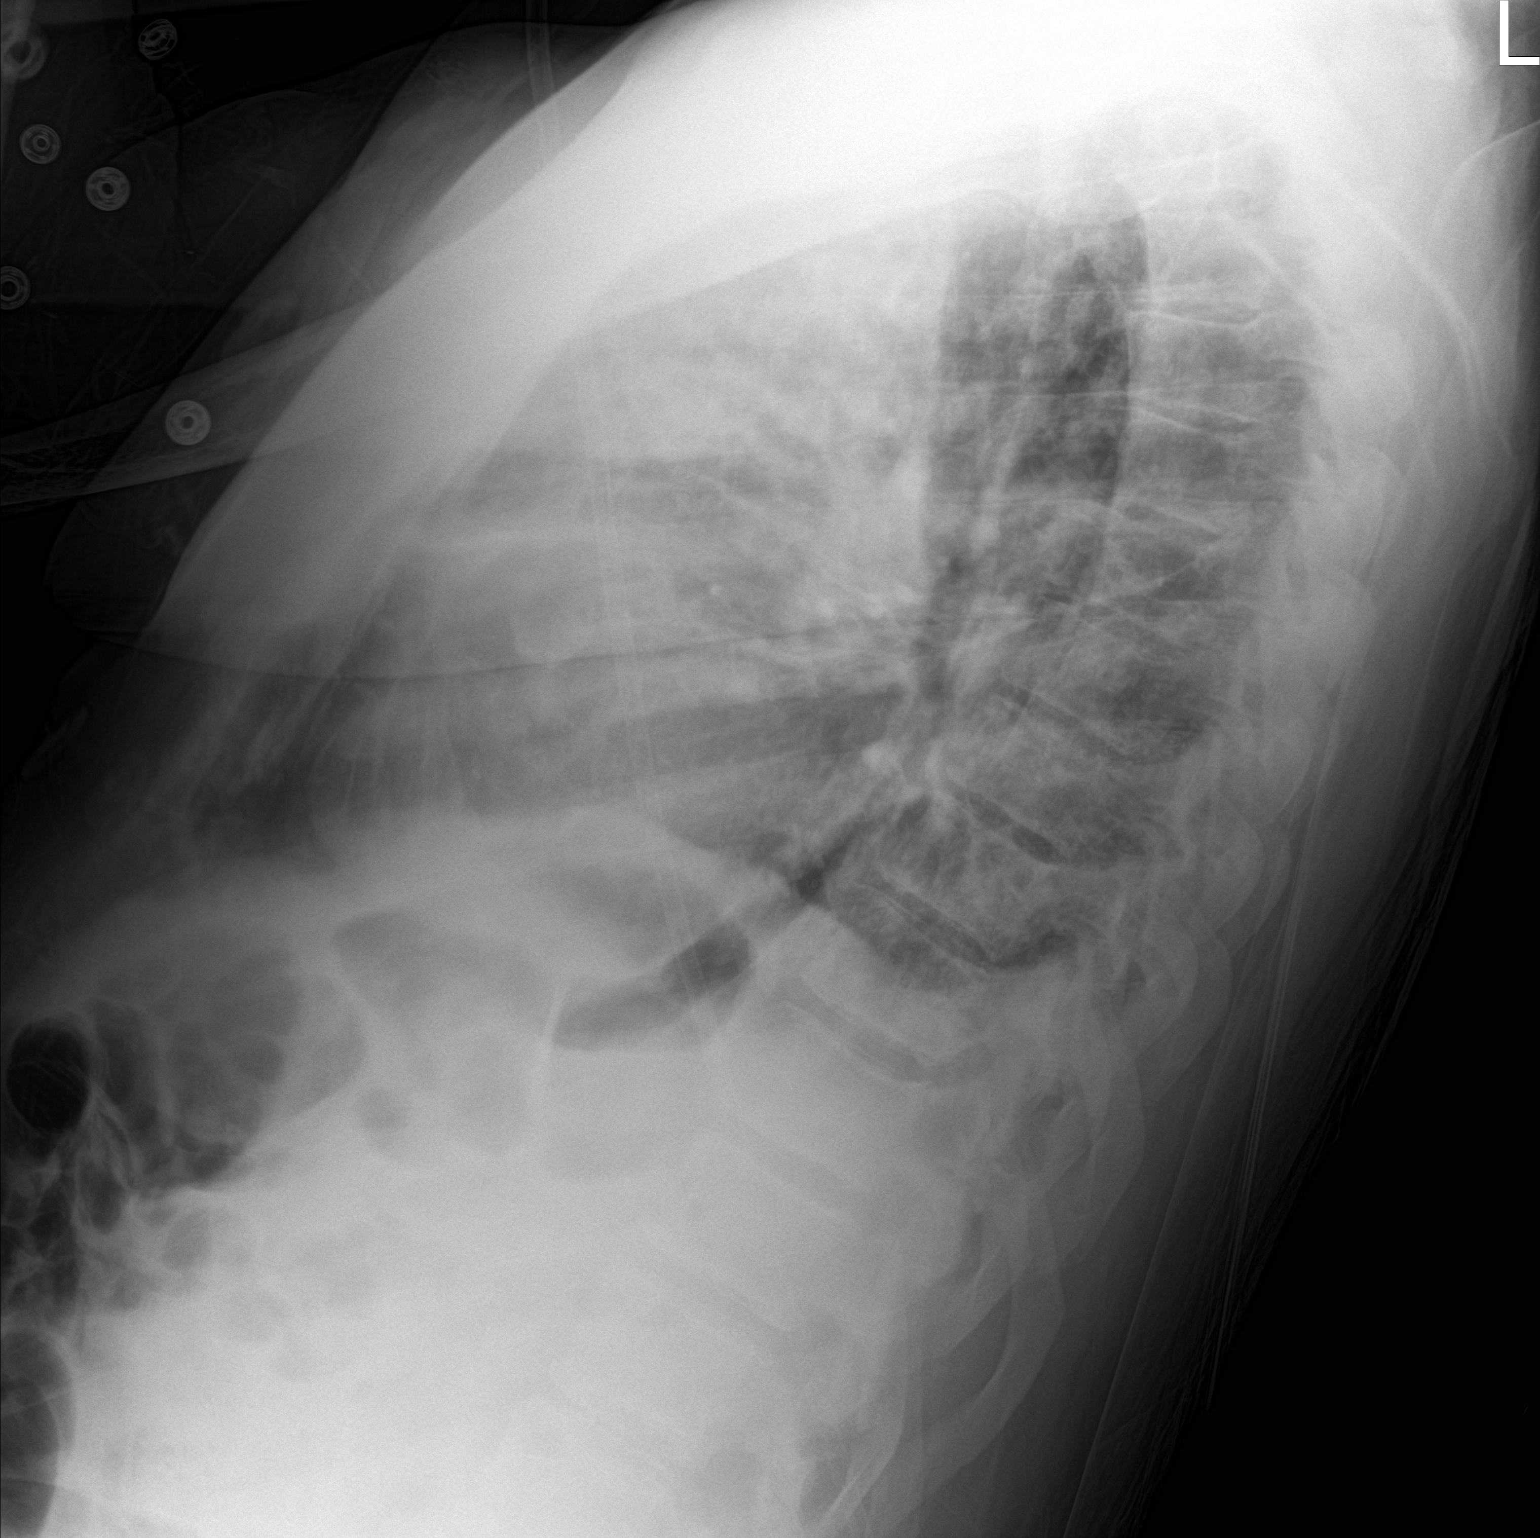

[chest ap]
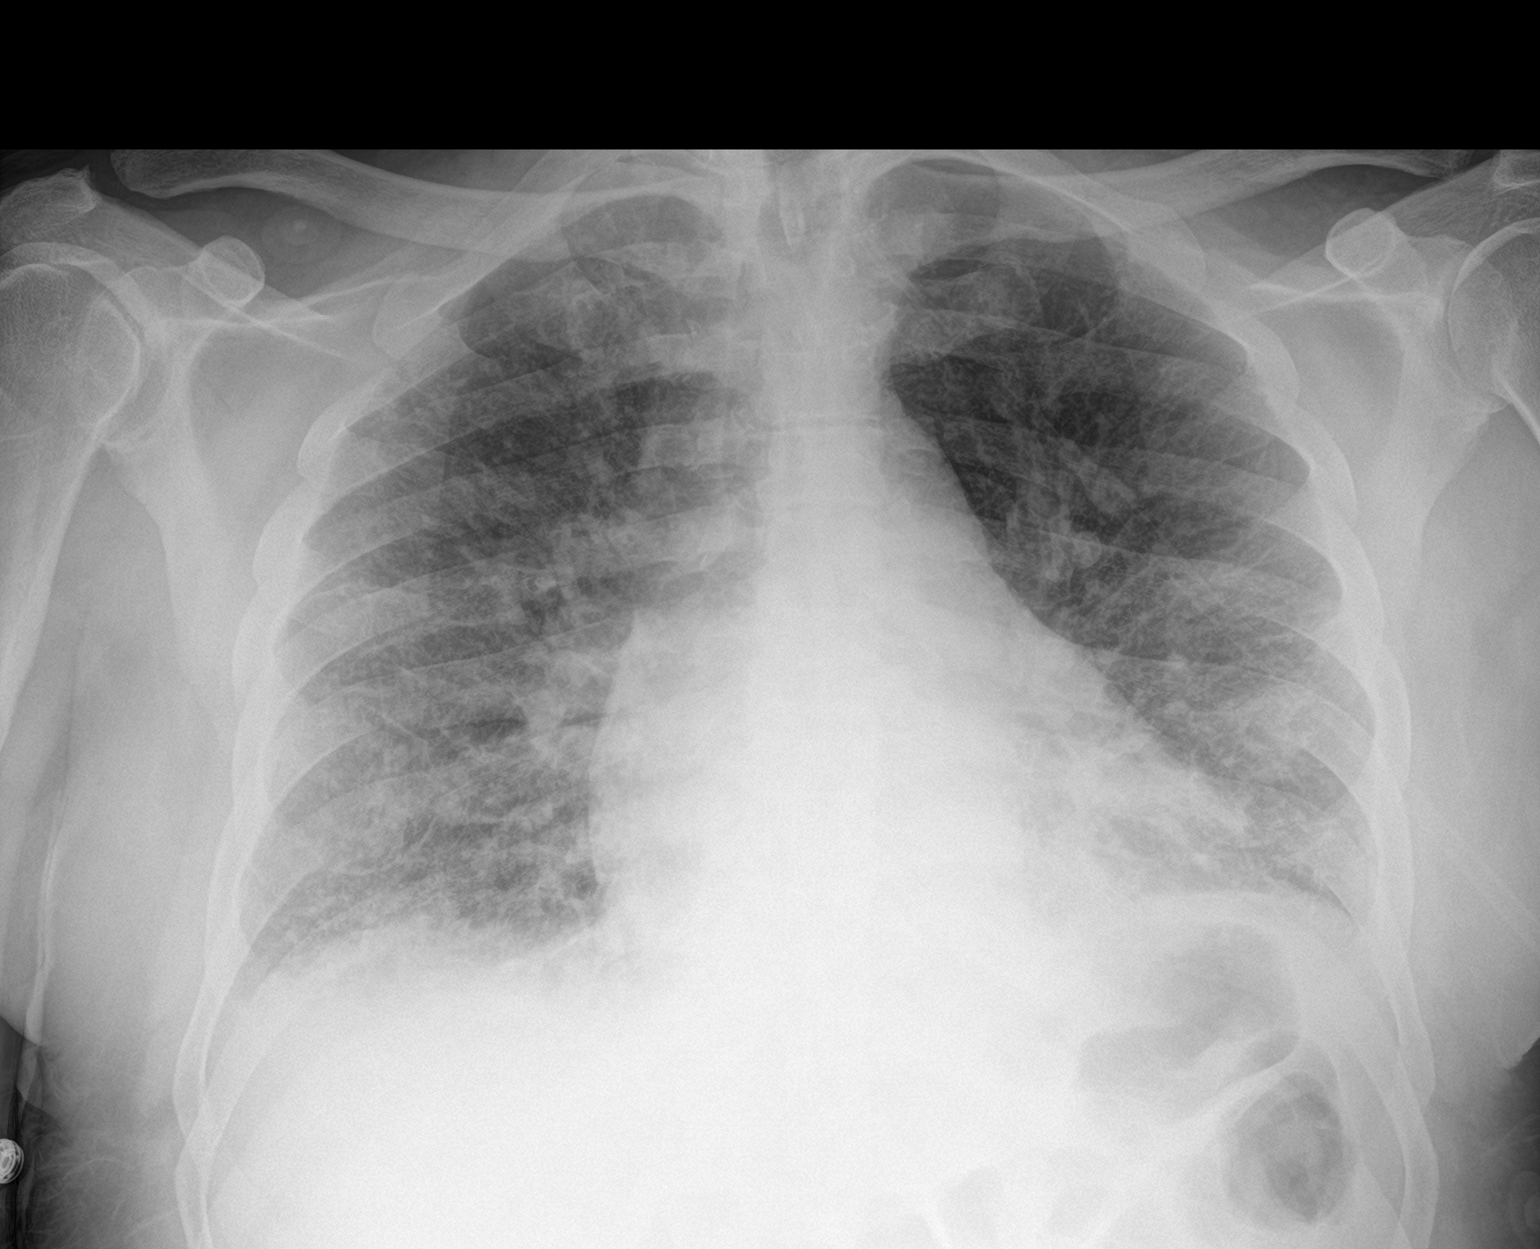

[2 of 2 positions shown; findings below may reference images not displayed]

FINDINGS: There is moderate generalized interstitial edema. There is a small
right pleural effusion. There is cardiomegaly with pulmonary venous
hypertension. No adenopathy. No appreciable airspace consolidation.
IMPRESSION: Evidence of a degree of congestive heart failure without airspace
consolidation.

## 2017-11-24 IMAGING — CR DG CHEST 2V
2 series · 2 of 2 positions shown · non-contrast
Comparison: PA and lateral chest x-ray September 22, 2015

CLINICAL DATA: Chronic cough, history of atrial flutter, diabetes,
end-stage renal disease, suspect fluid overload.

EXAM:
CHEST  2 VIEW

[chest lat]
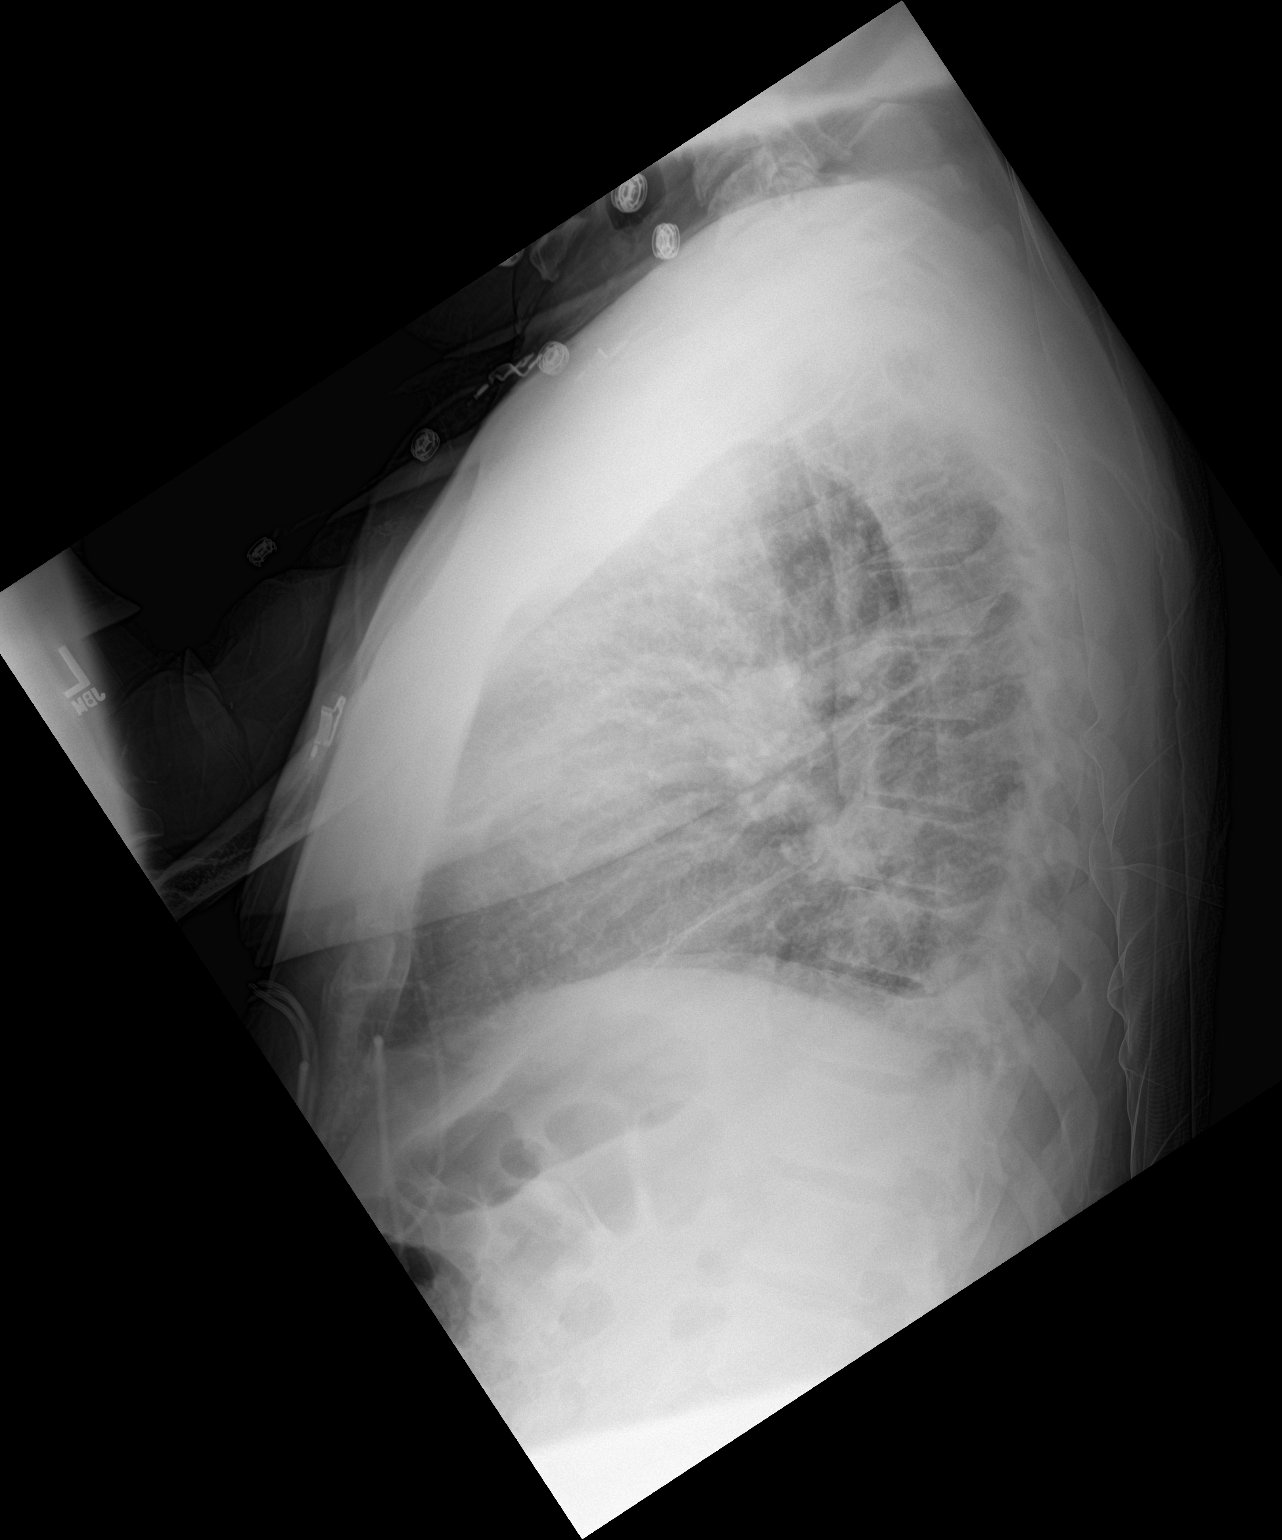

[chest ap]
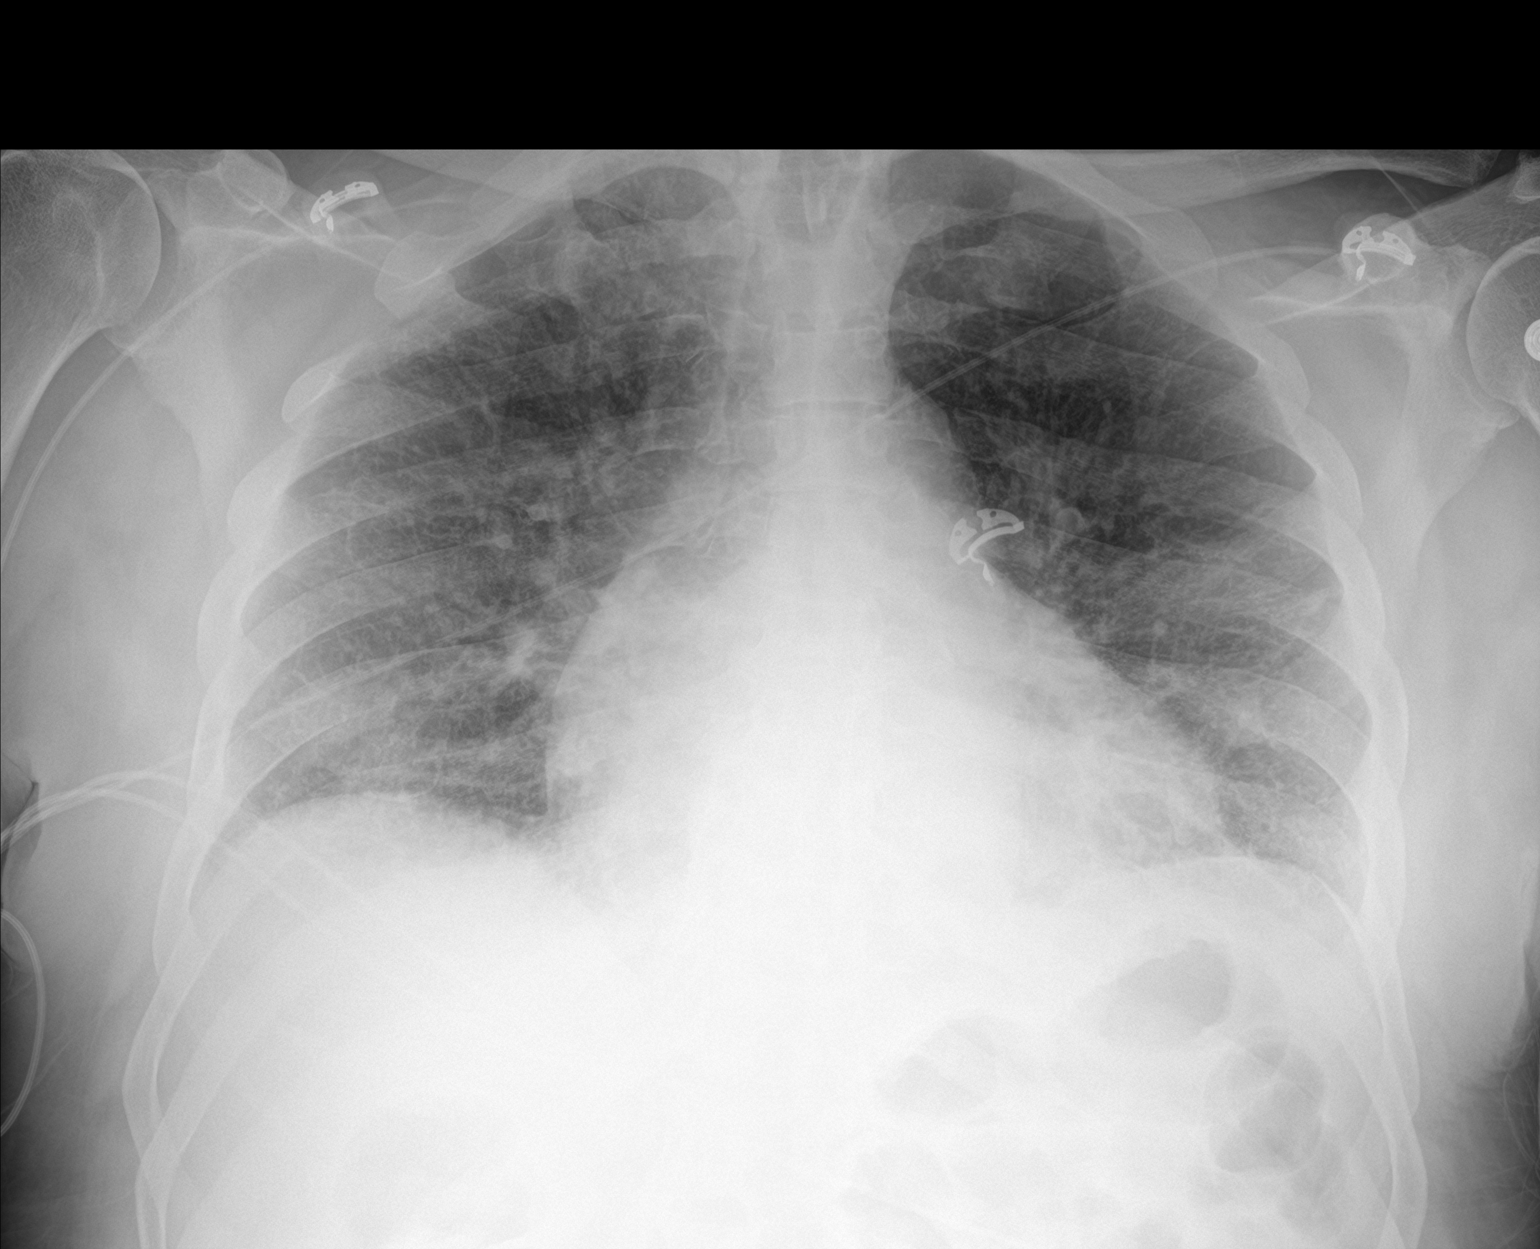

[2 of 2 positions shown; findings below may reference images not displayed]

FINDINGS: The lungs are slightly less well inflated today. The interstitial
markings remain increased. The cardiac silhouette remains enlarged.
The central pulmonary vascularity is engorged but stable. No
significant pleural effusion is observed. The bony thorax is
unremarkable.
IMPRESSION: Mild pulmonary interstitial edema secondary to CHF which has
improved since yesterday's study.
